# Patient Record
Sex: Female | Born: 1935
Health system: Southern US, Community
[De-identification: ages and names within clinical notes are randomized; demographics above are authoritative.]

## PROBLEM LIST (undated history)

## (undated) DIAGNOSIS — K819 Cholecystitis, unspecified: Secondary | ICD-10-CM

## (undated) DIAGNOSIS — A159 Respiratory tuberculosis unspecified: Secondary | ICD-10-CM

## (undated) DIAGNOSIS — E039 Hypothyroidism, unspecified: Secondary | ICD-10-CM

## (undated) DIAGNOSIS — D649 Anemia, unspecified: Secondary | ICD-10-CM

## (undated) DIAGNOSIS — C50919 Malignant neoplasm of unspecified site of unspecified female breast: Secondary | ICD-10-CM

## (undated) DIAGNOSIS — G9619 Other disorders of meninges, not elsewhere classified: Secondary | ICD-10-CM

## (undated) DIAGNOSIS — C73 Malignant neoplasm of thyroid gland: Secondary | ICD-10-CM

## (undated) DIAGNOSIS — G96198 Other disorders of meninges, not elsewhere classified: Secondary | ICD-10-CM

## (undated) DIAGNOSIS — M1990 Unspecified osteoarthritis, unspecified site: Secondary | ICD-10-CM

## (undated) DIAGNOSIS — K828 Other specified diseases of gallbladder: Secondary | ICD-10-CM

## (undated) DIAGNOSIS — K219 Gastro-esophageal reflux disease without esophagitis: Secondary | ICD-10-CM

## (undated) DIAGNOSIS — E119 Type 2 diabetes mellitus without complications: Secondary | ICD-10-CM

## (undated) DIAGNOSIS — C7931 Secondary malignant neoplasm of brain: Secondary | ICD-10-CM

## (undated) DIAGNOSIS — N390 Urinary tract infection, site not specified: Secondary | ICD-10-CM

## (undated) DIAGNOSIS — I10 Essential (primary) hypertension: Secondary | ICD-10-CM

## (undated) DIAGNOSIS — D493 Neoplasm of unspecified behavior of breast: Secondary | ICD-10-CM

## (undated) DIAGNOSIS — K37 Unspecified appendicitis: Secondary | ICD-10-CM

## (undated) HISTORY — DX: Malignant neoplasm of unspecified site of unspecified female breast: C50.919

## (undated) HISTORY — DX: Neoplasm of unspecified behavior of breast: D49.3

## (undated) HISTORY — PX: APPENDECTOMY: SHX54

## (undated) HISTORY — PX: SPINE SURGERY: SHX786

## (undated) HISTORY — DX: Gastro-esophageal reflux disease without esophagitis: K21.9

## (undated) HISTORY — PX: TOTAL THYROIDECTOMY: SHX2547

## (undated) HISTORY — PX: BREAST CYST EXCISION: SHX579

## (undated) HISTORY — DX: Other disorders of meninges, not elsewhere classified: G96.198

## (undated) HISTORY — DX: Unspecified appendicitis: K37

## (undated) HISTORY — DX: Respiratory tuberculosis unspecified: A15.9

## (undated) HISTORY — DX: Other disorders of meninges, not elsewhere classified: G96.19

## (undated) HISTORY — DX: Essential (primary) hypertension: I10

---

## 2000-05-26 ENCOUNTER — Encounter: Admission: RE | Admit: 2000-05-26 | Discharge: 2000-05-26 | Payer: Self-pay | Admitting: Obstetrics and Gynecology

## 2000-05-26 ENCOUNTER — Encounter: Payer: Self-pay | Admitting: Obstetrics and Gynecology

## 2001-05-29 ENCOUNTER — Encounter: Admission: RE | Admit: 2001-05-29 | Discharge: 2001-05-29 | Payer: Self-pay | Admitting: Obstetrics and Gynecology

## 2001-05-29 ENCOUNTER — Encounter: Payer: Self-pay | Admitting: Obstetrics and Gynecology

## 2001-07-06 ENCOUNTER — Other Ambulatory Visit: Admission: RE | Admit: 2001-07-06 | Discharge: 2001-07-06 | Payer: Self-pay | Admitting: Obstetrics and Gynecology

## 2002-06-04 ENCOUNTER — Encounter: Payer: Self-pay | Admitting: Obstetrics and Gynecology

## 2002-06-04 ENCOUNTER — Encounter: Admission: RE | Admit: 2002-06-04 | Discharge: 2002-06-04 | Payer: Self-pay | Admitting: Obstetrics and Gynecology

## 2003-11-10 ENCOUNTER — Ambulatory Visit (HOSPITAL_COMMUNITY): Admission: RE | Admit: 2003-11-10 | Discharge: 2003-11-10 | Payer: Self-pay | Admitting: Neurosurgery

## 2003-11-11 ENCOUNTER — Encounter (INDEPENDENT_AMBULATORY_CARE_PROVIDER_SITE_OTHER): Payer: Self-pay | Admitting: *Deleted

## 2003-11-11 ENCOUNTER — Inpatient Hospital Stay (HOSPITAL_COMMUNITY): Admission: RE | Admit: 2003-11-11 | Discharge: 2003-11-12 | Payer: Self-pay | Admitting: Neurosurgery

## 2004-05-11 ENCOUNTER — Encounter: Admission: RE | Admit: 2004-05-11 | Discharge: 2004-05-11 | Payer: Self-pay | Admitting: Obstetrics and Gynecology

## 2005-05-24 ENCOUNTER — Encounter: Admission: RE | Admit: 2005-05-24 | Discharge: 2005-05-24 | Payer: Self-pay | Admitting: Obstetrics and Gynecology

## 2005-07-13 ENCOUNTER — Emergency Department (HOSPITAL_COMMUNITY): Admission: EM | Admit: 2005-07-13 | Discharge: 2005-07-13 | Payer: Self-pay | Admitting: Emergency Medicine

## 2006-03-05 ENCOUNTER — Emergency Department (HOSPITAL_COMMUNITY): Admission: EM | Admit: 2006-03-05 | Discharge: 2006-03-06 | Payer: Self-pay | Admitting: Emergency Medicine

## 2006-05-27 ENCOUNTER — Encounter: Admission: RE | Admit: 2006-05-27 | Discharge: 2006-05-27 | Payer: Self-pay | Admitting: Internal Medicine

## 2006-06-05 ENCOUNTER — Encounter (INDEPENDENT_AMBULATORY_CARE_PROVIDER_SITE_OTHER): Payer: Self-pay | Admitting: Radiology

## 2006-06-05 ENCOUNTER — Encounter: Admission: RE | Admit: 2006-06-05 | Discharge: 2006-06-05 | Payer: Self-pay | Admitting: Internal Medicine

## 2006-06-05 ENCOUNTER — Encounter (INDEPENDENT_AMBULATORY_CARE_PROVIDER_SITE_OTHER): Payer: Self-pay | Admitting: *Deleted

## 2006-06-05 DIAGNOSIS — C50919 Malignant neoplasm of unspecified site of unspecified female breast: Secondary | ICD-10-CM

## 2006-06-05 HISTORY — DX: Malignant neoplasm of unspecified site of unspecified female breast: C50.919

## 2006-06-12 ENCOUNTER — Encounter: Admission: RE | Admit: 2006-06-12 | Discharge: 2006-06-12 | Payer: Self-pay | Admitting: General Surgery

## 2006-06-19 ENCOUNTER — Encounter: Admission: RE | Admit: 2006-06-19 | Discharge: 2006-06-19 | Payer: Self-pay | Admitting: General Surgery

## 2006-06-19 ENCOUNTER — Encounter (INDEPENDENT_AMBULATORY_CARE_PROVIDER_SITE_OTHER): Payer: Self-pay | Admitting: General Surgery

## 2006-06-19 ENCOUNTER — Encounter (INDEPENDENT_AMBULATORY_CARE_PROVIDER_SITE_OTHER): Payer: Self-pay | Admitting: *Deleted

## 2006-06-19 ENCOUNTER — Ambulatory Visit (HOSPITAL_BASED_OUTPATIENT_CLINIC_OR_DEPARTMENT_OTHER): Admission: RE | Admit: 2006-06-19 | Discharge: 2006-06-19 | Payer: Self-pay | Admitting: General Surgery

## 2006-06-19 HISTORY — PX: BREAST LUMPECTOMY WITH AXILLARY LYMPH NODE BIOPSY: SHX5593

## 2006-06-30 ENCOUNTER — Ambulatory Visit: Payer: Self-pay | Admitting: Oncology

## 2006-07-09 LAB — COMPREHENSIVE METABOLIC PANEL
ALT: 11 U/L (ref 0–35)
AST: 19 U/L (ref 0–37)
Albumin: 4.1 g/dL (ref 3.5–5.2)
CO2: 28 mEq/L (ref 19–32)
Calcium: 9.2 mg/dL (ref 8.4–10.5)
Chloride: 102 mEq/L (ref 96–112)
Creatinine, Ser: 0.74 mg/dL (ref 0.40–1.20)
Potassium: 4 mEq/L (ref 3.5–5.3)
Total Protein: 6.8 g/dL (ref 6.0–8.3)

## 2006-07-09 LAB — CBC WITH DIFFERENTIAL/PLATELET
BASO%: 0.4 % (ref 0.0–2.0)
Basophils Absolute: 0 10*3/uL (ref 0.0–0.1)
HCT: 37.2 % (ref 34.8–46.6)
HGB: 12.6 g/dL (ref 11.6–15.9)
MCHC: 34 g/dL (ref 32.0–36.0)
MONO#: 0.4 10*3/uL (ref 0.1–0.9)
NEUT%: 45.3 % (ref 39.6–76.8)
RDW: 13.5 % (ref 11.3–14.5)
WBC: 4.9 10*3/uL (ref 3.9–10.0)
lymph#: 2.1 10*3/uL (ref 0.9–3.3)

## 2006-07-09 LAB — CANCER ANTIGEN 27.29: CA 27.29: 19 U/mL (ref 0–39)

## 2006-07-11 ENCOUNTER — Ambulatory Visit: Admission: RE | Admit: 2006-07-11 | Discharge: 2006-09-22 | Payer: Self-pay | Admitting: *Deleted

## 2006-07-16 ENCOUNTER — Encounter: Admission: RE | Admit: 2006-07-16 | Discharge: 2006-07-16 | Payer: Self-pay | Admitting: *Deleted

## 2006-09-01 ENCOUNTER — Ambulatory Visit: Payer: Self-pay | Admitting: Oncology

## 2006-09-04 LAB — CBC WITH DIFFERENTIAL/PLATELET
BASO%: 0.6 % (ref 0.0–2.0)
EOS%: 2.6 % (ref 0.0–7.0)
LYMPH%: 28 % (ref 14.0–48.0)
MCH: 32.3 pg (ref 26.0–34.0)
MCHC: 34.9 g/dL (ref 32.0–36.0)
MCV: 92.7 fL (ref 81.0–101.0)
MONO%: 10.3 % (ref 0.0–13.0)
NEUT#: 2.1 10*3/uL (ref 1.5–6.5)
Platelets: 251 10*3/uL (ref 145–400)
RBC: 4.08 10*6/uL (ref 3.70–5.32)
RDW: 13.4 % (ref 11.3–14.5)

## 2006-09-04 LAB — COMPREHENSIVE METABOLIC PANEL
AST: 18 U/L (ref 0–37)
Albumin: 4.1 g/dL (ref 3.5–5.2)
Alkaline Phosphatase: 58 U/L (ref 39–117)
Glucose, Bld: 122 mg/dL — ABNORMAL HIGH (ref 70–99)
Potassium: 3.5 mEq/L (ref 3.5–5.3)
Sodium: 141 mEq/L (ref 135–145)
Total Bilirubin: 0.5 mg/dL (ref 0.3–1.2)
Total Protein: 6.9 g/dL (ref 6.0–8.3)

## 2006-10-28 ENCOUNTER — Ambulatory Visit: Payer: Self-pay | Admitting: Oncology

## 2006-10-30 LAB — COMPREHENSIVE METABOLIC PANEL
AST: 17 U/L (ref 0–37)
BUN: 12 mg/dL (ref 6–23)
CO2: 27 mEq/L (ref 19–32)
Calcium: 9.4 mg/dL (ref 8.4–10.5)
Chloride: 105 mEq/L (ref 96–112)
Creatinine, Ser: 0.76 mg/dL (ref 0.40–1.20)
Glucose, Bld: 121 mg/dL — ABNORMAL HIGH (ref 70–99)

## 2006-10-30 LAB — CBC WITH DIFFERENTIAL/PLATELET
Basophils Absolute: 0 10*3/uL (ref 0.0–0.1)
EOS%: 2.2 % (ref 0.0–7.0)
HCT: 37.6 % (ref 34.8–46.6)
HGB: 13.4 g/dL (ref 11.6–15.9)
MCH: 32.3 pg (ref 26.0–34.0)
NEUT%: 59.4 % (ref 39.6–76.8)
lymph#: 1.1 10*3/uL (ref 0.9–3.3)

## 2006-10-30 LAB — LACTATE DEHYDROGENASE: LDH: 127 U/L (ref 94–250)

## 2007-02-02 ENCOUNTER — Ambulatory Visit: Payer: Self-pay | Admitting: Oncology

## 2007-02-03 LAB — COMPREHENSIVE METABOLIC PANEL
AST: 19 U/L (ref 0–37)
Albumin: 4.2 g/dL (ref 3.5–5.2)
BUN: 18 mg/dL (ref 6–23)
Calcium: 8.8 mg/dL (ref 8.4–10.5)
Chloride: 105 mEq/L (ref 96–112)
Glucose, Bld: 124 mg/dL — ABNORMAL HIGH (ref 70–99)
Potassium: 3.7 mEq/L (ref 3.5–5.3)
Sodium: 142 mEq/L (ref 135–145)
Total Protein: 6.8 g/dL (ref 6.0–8.3)

## 2007-02-03 LAB — CBC WITH DIFFERENTIAL/PLATELET
Basophils Absolute: 0 10*3/uL (ref 0.0–0.1)
Eosinophils Absolute: 0.1 10*3/uL (ref 0.0–0.5)
HGB: 12.6 g/dL (ref 11.6–15.9)
NEUT#: 2.6 10*3/uL (ref 1.5–6.5)
RDW: 13.4 % (ref 11.3–14.5)
WBC: 4.3 10*3/uL (ref 3.9–10.0)
lymph#: 1.2 10*3/uL (ref 0.9–3.3)

## 2007-02-09 ENCOUNTER — Encounter: Admission: RE | Admit: 2007-02-09 | Discharge: 2007-02-09 | Payer: Self-pay | Admitting: Oncology

## 2007-02-16 LAB — CBC WITH DIFFERENTIAL/PLATELET
Basophils Absolute: 0 10*3/uL (ref 0.0–0.1)
Eosinophils Absolute: 0.1 10*3/uL (ref 0.0–0.5)
HCT: 34.9 % (ref 34.8–46.6)
HGB: 12.3 g/dL (ref 11.6–15.9)
LYMPH%: 15.8 % (ref 14.0–48.0)
MONO#: 0.1 10*3/uL (ref 0.1–0.9)
NEUT#: 4.9 10*3/uL (ref 1.5–6.5)
Platelets: 315 10*3/uL (ref 145–400)
RBC: 3.78 10*6/uL (ref 3.70–5.32)
WBC: 5.9 10*3/uL (ref 3.9–10.0)

## 2007-03-09 ENCOUNTER — Encounter (INDEPENDENT_AMBULATORY_CARE_PROVIDER_SITE_OTHER): Payer: Self-pay | Admitting: Radiology

## 2007-03-09 ENCOUNTER — Encounter: Admission: RE | Admit: 2007-03-09 | Discharge: 2007-03-09 | Payer: Self-pay | Admitting: Oncology

## 2007-03-09 ENCOUNTER — Other Ambulatory Visit: Admission: RE | Admit: 2007-03-09 | Discharge: 2007-03-09 | Payer: Self-pay | Admitting: Radiology

## 2007-04-09 ENCOUNTER — Emergency Department (HOSPITAL_COMMUNITY): Admission: EM | Admit: 2007-04-09 | Discharge: 2007-04-09 | Payer: Self-pay | Admitting: Emergency Medicine

## 2007-04-24 ENCOUNTER — Encounter: Admission: RE | Admit: 2007-04-24 | Discharge: 2007-04-24 | Payer: Self-pay | Admitting: General Surgery

## 2007-05-12 ENCOUNTER — Ambulatory Visit: Payer: Self-pay | Admitting: Oncology

## 2007-05-14 LAB — COMPREHENSIVE METABOLIC PANEL
CO2: 27 mEq/L (ref 19–32)
Creatinine, Ser: 0.73 mg/dL (ref 0.40–1.20)
Glucose, Bld: 81 mg/dL (ref 70–99)
Total Bilirubin: 0.5 mg/dL (ref 0.3–1.2)

## 2007-05-14 LAB — CBC WITH DIFFERENTIAL/PLATELET
BASO%: 0.1 % (ref 0.0–2.0)
Eosinophils Absolute: 0.1 10*3/uL (ref 0.0–0.5)
HCT: 35.5 % (ref 34.8–46.6)
LYMPH%: 34 % (ref 14.0–48.0)
MCHC: 35.3 g/dL (ref 32.0–36.0)
MONO#: 0.4 10*3/uL (ref 0.1–0.9)
NEUT%: 54.1 % (ref 39.6–76.8)
Platelets: 255 10*3/uL (ref 145–400)
WBC: 4 10*3/uL (ref 3.9–10.0)

## 2007-05-14 LAB — LACTATE DEHYDROGENASE: LDH: 137 U/L (ref 94–250)

## 2007-08-06 HISTORY — PX: CATARACT EXTRACTION, BILATERAL: SHX1313

## 2007-09-06 ENCOUNTER — Emergency Department (HOSPITAL_COMMUNITY): Admission: EM | Admit: 2007-09-06 | Discharge: 2007-09-06 | Payer: Self-pay | Admitting: Emergency Medicine

## 2007-09-16 ENCOUNTER — Ambulatory Visit: Payer: Self-pay | Admitting: Oncology

## 2007-09-17 LAB — CBC WITH DIFFERENTIAL/PLATELET
Basophils Absolute: 0 10*3/uL (ref 0.0–0.1)
EOS%: 2 % (ref 0.0–7.0)
HCT: 38.5 % (ref 34.8–46.6)
HGB: 12.7 g/dL (ref 11.6–15.9)
MCH: 30.1 pg (ref 26.0–34.0)
MCV: 90.9 fL (ref 81.0–101.0)
MONO%: 9.3 % (ref 0.0–13.0)
NEUT%: 55.9 % (ref 39.6–76.8)

## 2007-09-18 LAB — COMPREHENSIVE METABOLIC PANEL
ALT: 8 U/L (ref 0–35)
AST: 16 U/L (ref 0–37)
Creatinine, Ser: 0.74 mg/dL (ref 0.40–1.20)
Total Bilirubin: 0.4 mg/dL (ref 0.3–1.2)

## 2007-09-18 LAB — CANCER ANTIGEN 27.29: CA 27.29: 22 U/mL (ref 0–39)

## 2007-09-23 LAB — VITAMIN D 1,25 DIHYDROXY: Vit D, 1,25-Dihydroxy: 28 pg/mL (ref 6–62)

## 2007-10-22 ENCOUNTER — Encounter: Admission: RE | Admit: 2007-10-22 | Discharge: 2007-10-22 | Payer: Self-pay | Admitting: Oncology

## 2007-10-22 ENCOUNTER — Ambulatory Visit (HOSPITAL_COMMUNITY): Admission: RE | Admit: 2007-10-22 | Discharge: 2007-10-22 | Payer: Self-pay | Admitting: Oncology

## 2008-04-01 ENCOUNTER — Ambulatory Visit: Payer: Self-pay | Admitting: Oncology

## 2008-04-05 LAB — CBC WITH DIFFERENTIAL/PLATELET
Eosinophils Absolute: 0.1 10*3/uL (ref 0.0–0.5)
HGB: 12.6 g/dL (ref 11.6–15.9)
MCV: 92.5 fL (ref 81.0–101.0)
MONO#: 0.5 10*3/uL (ref 0.1–0.9)
MONO%: 10.5 % (ref 0.0–13.0)
NEUT#: 2.3 10*3/uL (ref 1.5–6.5)
RBC: 3.89 10*6/uL (ref 3.70–5.32)
RDW: 13.5 % (ref 11.3–14.5)
WBC: 4.6 10*3/uL (ref 3.9–10.0)
lymph#: 1.7 10*3/uL (ref 0.9–3.3)

## 2008-04-06 LAB — COMPREHENSIVE METABOLIC PANEL
AST: 16 U/L (ref 0–37)
Albumin: 4.1 g/dL (ref 3.5–5.2)
Alkaline Phosphatase: 61 U/L (ref 39–117)
Calcium: 9.1 mg/dL (ref 8.4–10.5)
Chloride: 102 mEq/L (ref 96–112)
Glucose, Bld: 94 mg/dL (ref 70–99)
Potassium: 4 mEq/L (ref 3.5–5.3)
Sodium: 138 mEq/L (ref 135–145)
Total Protein: 6.8 g/dL (ref 6.0–8.3)

## 2008-04-06 LAB — LACTATE DEHYDROGENASE: LDH: 135 U/L (ref 94–250)

## 2008-04-25 ENCOUNTER — Encounter: Admission: RE | Admit: 2008-04-25 | Discharge: 2008-04-25 | Payer: Self-pay | Admitting: Oncology

## 2008-09-30 ENCOUNTER — Ambulatory Visit: Payer: Self-pay | Admitting: Oncology

## 2008-10-04 LAB — CBC WITH DIFFERENTIAL/PLATELET
BASO%: 0.6 % (ref 0.0–2.0)
Eosinophils Absolute: 0.1 10*3/uL (ref 0.0–0.5)
HCT: 37.7 % (ref 34.8–46.6)
LYMPH%: 32.4 % (ref 14.0–49.7)
MCHC: 34.6 g/dL (ref 31.5–36.0)
MONO#: 0.4 10*3/uL (ref 0.1–0.9)
NEUT#: 3.2 10*3/uL (ref 1.5–6.5)
NEUT%: 58.2 % (ref 38.4–76.8)
Platelets: 246 10*3/uL (ref 145–400)
WBC: 5.6 10*3/uL (ref 3.9–10.3)
lymph#: 1.8 10*3/uL (ref 0.9–3.3)

## 2008-10-05 LAB — COMPREHENSIVE METABOLIC PANEL
ALT: 12 U/L (ref 0–35)
CO2: 25 mEq/L (ref 19–32)
Calcium: 8.9 mg/dL (ref 8.4–10.5)
Chloride: 102 mEq/L (ref 96–112)
Glucose, Bld: 137 mg/dL — ABNORMAL HIGH (ref 70–99)
Sodium: 141 mEq/L (ref 135–145)
Total Bilirubin: 0.4 mg/dL (ref 0.3–1.2)
Total Protein: 7.2 g/dL (ref 6.0–8.3)

## 2008-10-05 LAB — CANCER ANTIGEN 27.29: CA 27.29: 28 U/mL (ref 0–39)

## 2008-10-05 LAB — LACTATE DEHYDROGENASE: LDH: 151 U/L (ref 94–250)

## 2008-10-13 ENCOUNTER — Encounter: Admission: RE | Admit: 2008-10-13 | Discharge: 2008-10-13 | Payer: Self-pay | Admitting: Oncology

## 2009-04-26 ENCOUNTER — Encounter: Admission: RE | Admit: 2009-04-26 | Discharge: 2009-04-26 | Payer: Self-pay | Admitting: Oncology

## 2009-05-04 ENCOUNTER — Ambulatory Visit: Payer: Self-pay | Admitting: Oncology

## 2009-05-08 LAB — CBC WITH DIFFERENTIAL/PLATELET
EOS%: 1.5 % (ref 0.0–7.0)
Eosinophils Absolute: 0.1 10*3/uL (ref 0.0–0.5)
LYMPH%: 31.3 % (ref 14.0–49.7)
MCH: 32.9 pg (ref 25.1–34.0)
MCV: 93.8 fL (ref 79.5–101.0)
MONO%: 8.1 % (ref 0.0–14.0)
NEUT#: 2.5 10*3/uL (ref 1.5–6.5)
Platelets: 232 10*3/uL (ref 145–400)
RBC: 4.06 10*6/uL (ref 3.70–5.45)

## 2009-05-09 LAB — CANCER ANTIGEN 27.29: CA 27.29: 24 U/mL (ref 0–39)

## 2009-05-09 LAB — COMPREHENSIVE METABOLIC PANEL
Alkaline Phosphatase: 55 U/L (ref 39–117)
BUN: 15 mg/dL (ref 6–23)
Glucose, Bld: 149 mg/dL — ABNORMAL HIGH (ref 70–99)
Sodium: 139 mEq/L (ref 135–145)
Total Bilirubin: 0.4 mg/dL (ref 0.3–1.2)
Total Protein: 6.7 g/dL (ref 6.0–8.3)

## 2009-05-09 LAB — VITAMIN D 25 HYDROXY (VIT D DEFICIENCY, FRACTURES): Vit D, 25-Hydroxy: 42 ng/mL (ref 30–89)

## 2009-11-06 ENCOUNTER — Ambulatory Visit: Payer: Self-pay | Admitting: Oncology

## 2009-11-08 LAB — LACTATE DEHYDROGENASE: LDH: 152 U/L (ref 94–250)

## 2009-11-08 LAB — CBC WITH DIFFERENTIAL/PLATELET
BASO%: 0.5 % (ref 0.0–2.0)
Basophils Absolute: 0 10*3/uL (ref 0.0–0.1)
EOS%: 1.2 % (ref 0.0–7.0)
HCT: 39.4 % (ref 34.8–46.6)
HGB: 13.6 g/dL (ref 11.6–15.9)
LYMPH%: 27.6 % (ref 14.0–49.7)
MCH: 32.5 pg (ref 25.1–34.0)
MCHC: 34.4 g/dL (ref 31.5–36.0)
NEUT%: 63.2 % (ref 38.4–76.8)
Platelets: 243 10*3/uL (ref 145–400)
lymph#: 1.6 10*3/uL (ref 0.9–3.3)

## 2009-11-08 LAB — COMPREHENSIVE METABOLIC PANEL
ALT: 10 U/L (ref 0–35)
BUN: 16 mg/dL (ref 6–23)
CO2: 24 mEq/L (ref 19–32)
Calcium: 8.9 mg/dL (ref 8.4–10.5)
Chloride: 100 mEq/L (ref 96–112)
Creatinine, Ser: 0.82 mg/dL (ref 0.40–1.20)
Total Bilirubin: 0.4 mg/dL (ref 0.3–1.2)

## 2009-11-08 LAB — VITAMIN D 25 HYDROXY (VIT D DEFICIENCY, FRACTURES): Vit D, 25-Hydroxy: 38 ng/mL (ref 30–89)

## 2010-05-02 ENCOUNTER — Encounter: Admission: RE | Admit: 2010-05-02 | Discharge: 2010-05-02 | Payer: Self-pay | Admitting: Oncology

## 2010-05-25 ENCOUNTER — Ambulatory Visit: Payer: Self-pay | Admitting: Oncology

## 2010-05-29 LAB — CBC WITH DIFFERENTIAL/PLATELET
BASO%: 0.5 % (ref 0.0–2.0)
Basophils Absolute: 0 10*3/uL (ref 0.0–0.1)
EOS%: 1.5 % (ref 0.0–7.0)
HGB: 12.7 g/dL (ref 11.6–15.9)
MCH: 32.9 pg (ref 25.1–34.0)
MCHC: 34.9 g/dL (ref 31.5–36.0)
MCV: 94.1 fL (ref 79.5–101.0)
MONO%: 10.2 % (ref 0.0–14.0)
RBC: 3.85 10*6/uL (ref 3.70–5.45)
RDW: 13.2 % (ref 11.2–14.5)
lymph#: 1.4 10*3/uL (ref 0.9–3.3)

## 2010-05-30 LAB — COMPREHENSIVE METABOLIC PANEL
ALT: 12 U/L (ref 0–35)
AST: 20 U/L (ref 0–37)
Albumin: 4.2 g/dL (ref 3.5–5.2)
Alkaline Phosphatase: 51 U/L (ref 39–117)
Calcium: 9.8 mg/dL (ref 8.4–10.5)
Chloride: 99 mEq/L (ref 96–112)
Potassium: 3.7 mEq/L (ref 3.5–5.3)
Sodium: 139 mEq/L (ref 135–145)
Total Protein: 7.2 g/dL (ref 6.0–8.3)

## 2010-05-30 LAB — VITAMIN D 25 HYDROXY (VIT D DEFICIENCY, FRACTURES): Vit D, 25-Hydroxy: 64 ng/mL (ref 30–89)

## 2010-08-26 ENCOUNTER — Encounter: Payer: Self-pay | Admitting: General Surgery

## 2010-09-05 ENCOUNTER — Encounter: Payer: Self-pay | Admitting: Oncology

## 2010-12-04 ENCOUNTER — Other Ambulatory Visit: Payer: Self-pay | Admitting: Oncology

## 2010-12-04 ENCOUNTER — Encounter (HOSPITAL_BASED_OUTPATIENT_CLINIC_OR_DEPARTMENT_OTHER): Payer: Medicare Other | Admitting: Oncology

## 2010-12-04 DIAGNOSIS — C50219 Malignant neoplasm of upper-inner quadrant of unspecified female breast: Secondary | ICD-10-CM

## 2010-12-04 DIAGNOSIS — C50419 Malignant neoplasm of upper-outer quadrant of unspecified female breast: Secondary | ICD-10-CM

## 2010-12-04 DIAGNOSIS — C50919 Malignant neoplasm of unspecified site of unspecified female breast: Secondary | ICD-10-CM

## 2010-12-04 DIAGNOSIS — Z170 Estrogen receptor positive status [ER+]: Secondary | ICD-10-CM

## 2010-12-04 LAB — CBC WITH DIFFERENTIAL/PLATELET
BASO%: 0.4 % (ref 0.0–2.0)
Eosinophils Absolute: 0.1 10*3/uL (ref 0.0–0.5)
MCHC: 35 g/dL (ref 31.5–36.0)
MONO#: 0.5 10*3/uL (ref 0.1–0.9)
NEUT#: 3.1 10*3/uL (ref 1.5–6.5)
RBC: 3.85 10*6/uL (ref 3.70–5.45)
RDW: 13 % (ref 11.2–14.5)
WBC: 5.2 10*3/uL (ref 3.9–10.3)
lymph#: 1.5 10*3/uL (ref 0.9–3.3)

## 2010-12-05 LAB — COMPREHENSIVE METABOLIC PANEL
ALT: 11 U/L (ref 0–35)
Albumin: 4 g/dL (ref 3.5–5.2)
CO2: 23 mEq/L (ref 19–32)
Calcium: 8.7 mg/dL (ref 8.4–10.5)
Chloride: 102 mEq/L (ref 96–112)
Glucose, Bld: 173 mg/dL — ABNORMAL HIGH (ref 70–99)
Potassium: 3.2 mEq/L — ABNORMAL LOW (ref 3.5–5.3)
Sodium: 140 mEq/L (ref 135–145)
Total Bilirubin: 0.3 mg/dL (ref 0.3–1.2)
Total Protein: 6.3 g/dL (ref 6.0–8.3)

## 2010-12-05 LAB — LACTATE DEHYDROGENASE: LDH: 141 U/L (ref 94–250)

## 2010-12-11 ENCOUNTER — Encounter (HOSPITAL_BASED_OUTPATIENT_CLINIC_OR_DEPARTMENT_OTHER): Payer: Medicare Other | Admitting: Oncology

## 2010-12-11 ENCOUNTER — Other Ambulatory Visit: Payer: Self-pay | Admitting: Oncology

## 2010-12-11 DIAGNOSIS — N6310 Unspecified lump in the right breast, unspecified quadrant: Secondary | ICD-10-CM

## 2010-12-11 DIAGNOSIS — C50219 Malignant neoplasm of upper-inner quadrant of unspecified female breast: Secondary | ICD-10-CM

## 2010-12-21 NOTE — Op Note (Signed)
Monique Figueroa, Monique Figueroa             ACCOUNT NO.:  [id number]   MEDICAL RECORD NO.:  [id number]          PATIENT TYPE:  AMB   LOCATION:  DSC                          FACILITY:  [facility]   PHYSICIAN:  Timothy E. Earlene Plater, M.D. DATE OF BIRTH:  [birth year]   DATE OF PROCEDURE:  06/19/2006  DATE OF DISCHARGE:                               OPERATIVE REPORT   PREOPERATIVE DIAGNOSIS:  Ductal carcinoma in situ left breast.   POSTOPERATIVE DIAGNOSIS:  Ductal carcinoma in situ left breast.   PROCEDURE:  Blue dye injection, sentinel node biopsy, partial  mastectomy, left.   SURGEON:  Timothy E. Earlene Plater, M.D.   ANESTHESIA:  General.   Monique Figueroa is a 77 year old female who upon routine mammography was  found to have scattered questionable calcifications in upper outer  quadrant left breast.  Core biopsies done showed DCIS.  She has been  carefully counseled and is ready to proceed with the surgery.  Risk  factors include of course her age and a wide area of calcification to be  removed.  She knows that there is the chance of residual disease, of  reoperation, and certainly of diminution of the size of her breast.  She  was seen earlier today at the [facility] of and [place], where an  excellent bracketing needle localization was accomplished by Dr. Kearney Hard.  She was also injected with technetium in the left nipple prior to  surgery.  She was seen, identified, and the left breast marked.   The patient was taken to the operating room and placed supine. General  LMA anesthesia was provided.  The left chest and breast were exposed.  The location of the entrance of the bracketing wires was carefully  coordinated with the mammograms accompanying them, and the skin was  marked for a generous incision for partial mastectomy.  Blue dye was  injected then beneath the subcutaneous of the left nipple and massaged  well.  Then, the breast was prepped and draped in the usual fashion.  One area that was hot  was located in the left axilla.  Prior to each  incision, 0.25% Marcaine with epinephrine was used.  A small incision  was made in the midaxilla, and then following visual dissection and  Neoprobe use, a single focus hot tissue was found and removed with the  cautery.  No excessive bleeding.  It was only one area.  It was not  blue.  This was sent as #1, axilla, left, to the lab.  That wound was  covered, and then again after careful visualizing of the bracketing  needles and the mammograms, a generous incision was made in a slanting  horizontal fashion across the top of the breast.  It was 12 cm in  length.  I then dissected a superior flap to encompass the superior  needle and an inferior flap to encompass the inferior needle which was  below the nipple-areolar complex.  I then in essence remove the top half  of her breast tissue by surrounding the entire envelope of the breast  medially, superiorly, and laterally around the pectoralis muscle and  down to the pectoralis fascia, and then inferiorly down to below the  nipple-areolar complex.  This area was carefully marked with sutures of  different colors and submitted to x-ray.  Meanwhile, bleeding was  controlled. That wound was perfectly dry.   The pathologist returned a call that the sentinel node was negative.  That wound was closed with Monocryl.  Dr. Kearney Hard returned a call that the  area of questionable calcifications was included in the excised  specimen.  The wound was checked again and then closed in layers and the  skin with wide skin staples.  All counts were correct.  She had  tolerated this well.  I had talked the pathologist, and we elected not  to the touch preps of the specimen since it was DCIS as far as we know,  and we will await the final pathology report.  So, the bandages were  applied, tape was carefully applied, and the patient was removed to the  recovery room in good condition.   Careful discussion with the  family and revelation of all known  information at present, and close followup to be accomplished.  Vicodin  and written instructions were given.      Timothy E. Earlene Plater, M.D.  Electronically Signed     TED/MEDQ  D:  06/19/2006  T:  06/19/2006  Job:  [id number]   cc:   [facility]

## 2010-12-25 NOTE — Op Note (Signed)
NAMEMarland Figueroa  Monique Figueroa, CRUTCHLEY                       ACCOUNT NO.:  [id number]   MEDICAL RECORD NO.:  [id number]                   PATIENT TYPE:  INP   LOCATION:  3010                                 FACILITY:  [facility]   PHYSICIAN:  Danae Orleans. Venetia Maxon, M.D.               DATE OF BIRTH:  [birth year]   DATE OF PROCEDURE:  11/11/2003  DATE OF DISCHARGE:                                 OPERATIVE REPORT   PREOPERATIVE DIAGNOSIS:  Synovial cyst L5-S1 right with spondylosis,  degenerative disc disease, and radiculopathy.   POSTOPERATIVE DIAGNOSIS:  Synovial cyst L5-S1 right with spondylosis,  degenerative disc disease, and radiculopathy.   PROCEDURE:  Right L5-S1 hemi-semilaminectomy with resection of synovial cyst  with microdissection.   SURGEON:  Danae Orleans. Venetia Maxon, M.D.   ASSISTANT:  Monique Figueroa, M.D.   ANESTHESIA:  General endotracheal anesthesia.   ESTIMATED BLOOD LOSS:  Minimal.   COMPLICATIONS:  None.   DISPOSITION:  Recovery room.   INDICATIONS FOR PROCEDURE:  Monique Figueroa is a 77 year old woman with  right lower extremity pain in the S1 distribution.  She had preoperative  imaging which demonstrates a synovial cyst compressing the right S1 nerve  root.  It was elected to take her to surgery for decompression of this cyst.   PROCEDURE:  [name] was brought to the operating room.  Following  satisfactory uncomplicated induction of general endotracheal anesthesia and  insertion of intravenous  lines, the patient was placed in a prone position  on the operating table on a Wilson frame.  Her low back was then prepped and  draped in the usual sterile fashion.  The area of planned incision was  infiltrated with 0.25% Marcaine and 0.5% lidocaine with 1:200,000  epinephrine.  An incision was made in the midline and carried through  copious adipost tissue until the lumbodorsal fascia was incised on the right  side of the midline.  Subperiosteal dissection was then performed  exposing  the L5-S1 interspace.  A marker probe was placed at what were felt to be the  L4-L5 and L5-S1 level and interoperative x-ray confirmed the correct  orientation.  Subsequently, hemi-semilaminectomy of L5 was performed using a  high speed drill and Kerrison rongeur.  The ligamentum flavum was detached  and removed in a piecemeal fashion, although this was adherent to the  synovial cyst and was not completely removed.  A foraminotomy was performed  overlying the S1 nerve root.  The microscope was brought onto the field and  using microdissection technique, the laminectomy was extended cephalad to  dura beyond the cyst and this was then very carefully dissected free from  the thecal sac using painstaking microdissection technique.  The cyst was  very densely adherent to the dura and was quite difficult to remove.  Using  traction along the cyst, it was eventually freed from the dura and from the  nerve root and was then removed in  a piecemeal fashion.  Eventually, the S1  nerve root was decompressed as was the lateral aspect of the thecal sac and  a small amount of residual cyst material was left adherent to the axilla of  the take off of the S1 nerve root as I felt there was too high a risk of  dural injury to remove this and there was no longer any compression on the  dura.  The wound was then copiously irrigated with Bacitracin saline.  The  dura was inspected and there was no evidence of any CSF leak.  The self-  retaining retractor was removed, the microscope was taken off the field.  A  coronary dilator was easily inserted out the neural foramen prior to  removing the microscope demonstrating complete decompression of the nerve  root.  The lumbodorsal fascia was closed with 0 Vicryl sutures, the  subcutaneous tissues were reapproximated with 2-0 Vicryl interrupted  inverted sutures, and the skin edges were reapproximated with interrupted 3-  0 Vicryl subcuticular tissue.  The  wound was dressed with Dermabond.  The  patient was extubated in the operating room and taken to the recovery room  in stable satisfactory condition having tolerated the operation well.  Counts were correct at the end of the case.                                               Danae Orleans. Venetia Maxon, M.D.    JDS/MEDQ  D:  11/11/2003  T:  11/11/2003  Job:  [id number]

## 2011-05-06 ENCOUNTER — Other Ambulatory Visit: Payer: Medicare Other

## 2011-05-08 ENCOUNTER — Ambulatory Visit
Admission: RE | Admit: 2011-05-08 | Discharge: 2011-05-08 | Disposition: A | Discharge status: Home / Self Care | Payer: Medicare Other | Source: Ambulatory Visit | Attending: Oncology | Admitting: Oncology

## 2011-05-08 DIAGNOSIS — N6310 Unspecified lump in the right breast, unspecified quadrant: Secondary | ICD-10-CM

## 2011-06-06 ENCOUNTER — Other Ambulatory Visit: Payer: Self-pay | Admitting: Oncology

## 2011-06-06 ENCOUNTER — Encounter (HOSPITAL_BASED_OUTPATIENT_CLINIC_OR_DEPARTMENT_OTHER): Payer: Medicare Other | Admitting: Oncology

## 2011-06-06 DIAGNOSIS — C50219 Malignant neoplasm of upper-inner quadrant of unspecified female breast: Secondary | ICD-10-CM

## 2011-06-06 LAB — CBC WITH DIFFERENTIAL/PLATELET
BASO%: 0.4 % (ref 0.0–2.0)
Basophils Absolute: 0 10*3/uL (ref 0.0–0.1)
HCT: 37.9 % (ref 34.8–46.6)
HGB: 12.7 g/dL (ref 11.6–15.9)
LYMPH%: 24 % (ref 14.0–49.7)
MCH: 31.5 pg (ref 25.1–34.0)
MCHC: 33.6 g/dL (ref 31.5–36.0)
MONO#: 0.4 10*3/uL (ref 0.1–0.9)
NEUT%: 66 % (ref 38.4–76.8)
Platelets: 252 10*3/uL (ref 145–400)
WBC: 5.6 10*3/uL (ref 3.9–10.3)
lymph#: 1.3 10*3/uL (ref 0.9–3.3)

## 2011-06-07 LAB — COMPREHENSIVE METABOLIC PANEL
ALT: 13 U/L (ref 0–35)
AST: 21 U/L (ref 0–37)
Albumin: 4.2 g/dL (ref 3.5–5.2)
BUN: 15 mg/dL (ref 6–23)
CO2: 25 mEq/L (ref 19–32)
Calcium: 9.2 mg/dL (ref 8.4–10.5)
Chloride: 102 mEq/L (ref 96–112)
Creatinine, Ser: 0.82 mg/dL (ref 0.50–1.10)
Potassium: 3.2 mEq/L — ABNORMAL LOW (ref 3.5–5.3)

## 2011-06-10 ENCOUNTER — Ambulatory Visit (HOSPITAL_BASED_OUTPATIENT_CLINIC_OR_DEPARTMENT_OTHER): Payer: Medicare Other | Admitting: Oncology

## 2011-06-10 VITALS — BP 163/85 | HR 85 | Temp 97.6°F | Ht 64.3 in | Wt 184.7 lb

## 2011-06-10 DIAGNOSIS — C50919 Malignant neoplasm of unspecified site of unspecified female breast: Secondary | ICD-10-CM

## 2011-06-10 DIAGNOSIS — C50219 Malignant neoplasm of upper-inner quadrant of unspecified female breast: Secondary | ICD-10-CM

## 2011-06-10 DIAGNOSIS — E119 Type 2 diabetes mellitus without complications: Secondary | ICD-10-CM

## 2011-06-10 NOTE — Progress Notes (Signed)
Hematology and Oncology Follow Up Visit  Monique Figueroa [id number] [birth year] 77 y.o. 06/10/2011 6:19 PM   Principle Diagnosis: T1 invasive breast cancer associated DCIS, status post lumpectomy 06/19/2006, status post radiation therapy completed 09/20/2007 on Arimidex.  Interim History:  Ms. Monique Figueroa returns for followup. She is doing well. Her husband has recovered from his colon cancer surgery and chemotherapy. Ms. Monique Figueroa has been busy with a [employer] fundraiser which were raised about $7000 for her church.  Medications: I have reviewed the patient's current medications.  Allergies: Allergies not on file  Past Medical History, Surgical history, Social history, and Family History were reviewed and updated.  Review of Systems: Constitutional:  Negative for fever, chills, night sweats, anorexia, weight loss, pain. Cardiovascular: no chest pain or dyspnea on exertion Respiratory: no cough, shortness of breath, or wheezing Neurological: negative Dermatological: negative ENT: negative Skin Gastrointestinal: no abdominal pain, change in bowel habits, or black or bloody stools Genito-Urinary: no dysuria, trouble voiding, or hematuria Hematological and Lymphatic: negative Breast: negative for breast lumps Musculoskeletal: negative Remaining ROS negative.  Physical Exam: Blood pressure 163/85, pulse 85, temperature 97.6 F (36.4 C), temperature source Oral, height 5' 4.3" (1.633 m), weight 83.779 kg (184 lb 11.2 oz). ECOG:  General appearance: alert, cooperative and appears stated age Head: Normocephalic, without obvious abnormality, atraumatic Neck: no adenopathy, no carotid bruit, no JVD, supple, symmetrical, trachea midline and thyroid not enlarged, symmetric, no tenderness/mass/nodules Lymph nodes: Cervical, supraclavicular, and axillary nodes normal. Breast: Both breasts are free of any obvious masses. The left breast status post lumpectomy. There is a scar which has  healed well. No nipple retraction or skin changes. Cardiac exam: Normal rate and rhythm, no murmurs Lung exam: Normal air entry no rales or rhonchi Abdominal exam: No masses, no organomegaly  Extremities: Normal   Lab Results: Lab Results  Component Value Date   HGB 12.7 06/06/2011   HCT 37.9 06/06/2011   MCV 93.9 06/06/2011   PLT 252 06/06/2011     Chemistry      Component Value Date/Time   NA 140 06/06/2011 1338   NA 140 06/06/2011 1338   NA 140 06/06/2011 1338   NA 140 06/06/2011 1338   K 3.2* 06/06/2011 1338   K 3.2* 06/06/2011 1338   K 3.2* 06/06/2011 1338   K 3.2* 06/06/2011 1338   CL 102 06/06/2011 1338   CL 102 06/06/2011 1338   CL 102 06/06/2011 1338   CL 102 06/06/2011 1338   CO2 25 06/06/2011 1338   CO2 25 06/06/2011 1338   CO2 25 06/06/2011 1338   CO2 25 06/06/2011 1338   BUN 15 06/06/2011 1338   BUN 15 06/06/2011 1338   BUN 15 06/06/2011 1338   BUN 15 06/06/2011 1338   CREATININE 0.82 06/06/2011 1338   CREATININE 0.82 06/06/2011 1338   CREATININE 0.82 06/06/2011 1338   CREATININE 0.82 06/06/2011 1338      Component Value Date/Time   CALCIUM 9.2 06/06/2011 1338   CALCIUM 9.2 06/06/2011 1338   CALCIUM 9.2 06/06/2011 1338   CALCIUM 9.2 06/06/2011 1338   ALKPHOS 49 06/06/2011 1338   ALKPHOS 49 06/06/2011 1338   ALKPHOS 49 06/06/2011 1338   ALKPHOS 49 06/06/2011 1338   AST 21 06/06/2011 1338   AST 21 06/06/2011 1338   AST 21 06/06/2011 1338   AST 21 06/06/2011 1338   ALT 13 06/06/2011 1338   ALT 13 06/06/2011 1338   ALT 13 06/06/2011 1338   ALT 13  06/06/2011 1338   BILITOT 0.4 06/06/2011 1338   BILITOT 0.4 06/06/2011 1338   BILITOT 0.4 06/06/2011 1338   BILITOT 0.4 06/06/2011 1338       Radiological Studies: chest X-ray  None  Impression and Plan: Ms. Monique Figueroa is doing well. She has completed 5 years of Arimidex. Of note is she has been diagnosed with type 2 diabetes does take some oral hypoglycemics for this. I recommended that she discontinue Arimidex and we will see her in one  year with appropriate imaging studies in advance   Spent more than half the time coordinating care.    Pierce Crane, MD 11/5/20126:19 PM

## 2011-06-11 ENCOUNTER — Telehealth: Payer: Self-pay | Admitting: Oncology

## 2011-06-11 NOTE — Telephone Encounter (Signed)
mailed pts appt for nov2013 to her on 06/11/2011

## 2011-12-20 DIAGNOSIS — E119 Type 2 diabetes mellitus without complications: Secondary | ICD-10-CM | POA: Diagnosis not present

## 2011-12-20 DIAGNOSIS — E7800 Pure hypercholesterolemia, unspecified: Secondary | ICD-10-CM | POA: Diagnosis not present

## 2011-12-20 DIAGNOSIS — I10 Essential (primary) hypertension: Secondary | ICD-10-CM | POA: Diagnosis not present

## 2011-12-25 DIAGNOSIS — I10 Essential (primary) hypertension: Secondary | ICD-10-CM | POA: Diagnosis not present

## 2011-12-25 DIAGNOSIS — R7309 Other abnormal glucose: Secondary | ICD-10-CM | POA: Diagnosis not present

## 2012-04-27 DIAGNOSIS — M25569 Pain in unspecified knee: Secondary | ICD-10-CM | POA: Diagnosis not present

## 2012-05-01 DIAGNOSIS — Z23 Encounter for immunization: Secondary | ICD-10-CM | POA: Diagnosis not present

## 2012-05-04 ENCOUNTER — Other Ambulatory Visit: Payer: Self-pay | Admitting: Oncology

## 2012-05-04 DIAGNOSIS — Z853 Personal history of malignant neoplasm of breast: Secondary | ICD-10-CM

## 2012-05-13 ENCOUNTER — Ambulatory Visit
Admission: RE | Admit: 2012-05-13 | Discharge: 2012-05-13 | Disposition: A | Discharge status: Home / Self Care | Payer: Medicare Other | Source: Ambulatory Visit | Attending: Oncology | Admitting: Oncology

## 2012-05-13 DIAGNOSIS — R922 Inconclusive mammogram: Secondary | ICD-10-CM | POA: Diagnosis not present

## 2012-05-13 DIAGNOSIS — Z853 Personal history of malignant neoplasm of breast: Secondary | ICD-10-CM

## 2012-06-03 DIAGNOSIS — E119 Type 2 diabetes mellitus without complications: Secondary | ICD-10-CM | POA: Diagnosis not present

## 2012-06-03 DIAGNOSIS — H04129 Dry eye syndrome of unspecified lacrimal gland: Secondary | ICD-10-CM | POA: Diagnosis not present

## 2012-06-09 ENCOUNTER — Ambulatory Visit (HOSPITAL_BASED_OUTPATIENT_CLINIC_OR_DEPARTMENT_OTHER): Payer: Medicare Other | Admitting: Oncology

## 2012-06-09 ENCOUNTER — Other Ambulatory Visit (HOSPITAL_BASED_OUTPATIENT_CLINIC_OR_DEPARTMENT_OTHER): Payer: Medicare Other

## 2012-06-09 ENCOUNTER — Telehealth: Payer: Self-pay | Admitting: Oncology

## 2012-06-09 VITALS — BP 129/80 | HR 96 | Temp 97.6°F | Resp 20 | Ht 64.3 in | Wt 178.9 lb

## 2012-06-09 DIAGNOSIS — C50219 Malignant neoplasm of upper-inner quadrant of unspecified female breast: Secondary | ICD-10-CM | POA: Diagnosis not present

## 2012-06-09 DIAGNOSIS — C50919 Malignant neoplasm of unspecified site of unspecified female breast: Secondary | ICD-10-CM

## 2012-06-09 DIAGNOSIS — E559 Vitamin D deficiency, unspecified: Secondary | ICD-10-CM

## 2012-06-09 LAB — CBC WITH DIFFERENTIAL/PLATELET
Basophils Absolute: 0 10*3/uL (ref 0.0–0.1)
Eosinophils Absolute: 0.1 10*3/uL (ref 0.0–0.5)
HCT: 37.8 % (ref 34.8–46.6)
HGB: 13 g/dL (ref 11.6–15.9)
LYMPH%: 27.4 % (ref 14.0–49.7)
MCH: 32.6 pg (ref 25.1–34.0)
MCV: 94.5 fL (ref 79.5–101.0)
MONO%: 8.7 % (ref 0.0–14.0)
NEUT#: 3.3 10*3/uL (ref 1.5–6.5)
NEUT%: 61.4 % (ref 38.4–76.8)
Platelets: 227 10*3/uL (ref 145–400)

## 2012-06-09 LAB — COMPREHENSIVE METABOLIC PANEL (CC13)
Albumin: 4 g/dL (ref 3.5–5.0)
Alkaline Phosphatase: 53 U/L (ref 40–150)
BUN: 15 mg/dL (ref 7.0–26.0)
Creatinine: 0.8 mg/dL (ref 0.6–1.1)
Glucose: 153 mg/dl — ABNORMAL HIGH (ref 70–99)
Potassium: 3.8 mEq/L (ref 3.5–5.1)

## 2012-06-09 NOTE — Progress Notes (Signed)
Hematology and Oncology Follow Up Visit  Monique Figueroa [id number] May 15, 1934 78 y.o. 06/09/2012 3:03 PM   Principle Diagnosis: T1 invasive breast cancer associated DCIS, status post lumpectomy 06/19/2006, status post radiation therapy completed 09/19/2006 on Arimidex.  Interim History:  Ms. Monique Figueroa returns for followup. She is doing well. Her husband has recovered from his colon cancer surgery and chemotherapy. Ms. Monique Figueroa has been busy with a [employer] stew fundraiser which were raised about $7000 for her church. Her husband is also doing well. He has a appointment today as well. She has had no complaints. She is busy with her church and her family friends and is getting married in 2 weeks. Her last mammogram in October which was normal. Her labwork performed today was within normal limits as well. Her diabetic control pill is quite good.  Medications: I have reviewed the patient's current medications.  Allergies: Allergies not on file  Past Medical History, Surgical history, Social history, and Family History were reviewed and updated.  Review of Systems: Constitutional:  Negative for fever, chills, night sweats, anorexia, weight loss, pain. Cardiovascular: no chest pain or dyspnea on exertion Respiratory: no cough, shortness of breath, or wheezing Neurological: negative Dermatological: negative ENT: negative Skin Gastrointestinal: no abdominal pain, change in bowel habits, or black or bloody stools Genito-Urinary: no dysuria, trouble voiding, or hematuria Hematological and Lymphatic: negative Breast: negative for breast lumps Musculoskeletal: negative Remaining ROS negative.  Physical Exam: Blood pressure 129/80, pulse 96, temperature 97.6 F (36.4 C), resp. rate 20, height 5' 4.3" (1.633 m), weight 178 lb 14.4 oz (81.149 kg). ECOG:  General appearance: alert, cooperative and appears stated age Head: Normocephalic, without obvious abnormality, atraumatic Neck: no  adenopathy, no carotid bruit, no JVD, supple, symmetrical, trachea midline and thyroid not enlarged, symmetric, no tenderness/mass/nodules Lymph nodes: Cervical, supraclavicular, and axillary nodes normal. Breast: Both breasts are free of any obvious masses. The left breast status post lumpectomy. There is a scar which has healed well. No nipple retraction or skin changes. There is no discrete distortion in the central portion of the left breast. Cardiac exam: Normal rate and rhythm, no murmurs Lung exam: Normal air entry no rales or rhonchi Abdominal exam: No masses, no organomegaly  Extremities: Normal   Lab Results: Lab Results  Component Value Date   WBC 5.4 06/09/2012   HGB 13.0 06/09/2012   HCT 37.8 06/09/2012   MCV 94.5 06/09/2012   PLT 227 06/09/2012     Chemistry      Component Value Date/Time   NA 140 06/06/2011 1338   NA 140 06/06/2011 1338   NA 140 06/06/2011 1338   NA 140 06/06/2011 1338   K 3.2* 06/06/2011 1338   K 3.2* 06/06/2011 1338   K 3.2* 06/06/2011 1338   K 3.2* 06/06/2011 1338   CL 102 06/06/2011 1338   CL 102 06/06/2011 1338   CL 102 06/06/2011 1338   CL 102 06/06/2011 1338   CO2 25 06/06/2011 1338   CO2 25 06/06/2011 1338   CO2 25 06/06/2011 1338   CO2 25 06/06/2011 1338   BUN 15 06/06/2011 1338   BUN 15 06/06/2011 1338   BUN 15 06/06/2011 1338   BUN 15 06/06/2011 1338   CREATININE 0.82 06/06/2011 1338   CREATININE 0.82 06/06/2011 1338   CREATININE 0.82 06/06/2011 1338   CREATININE 0.82 06/06/2011 1338      Component Value Date/Time   CALCIUM 9.2 06/06/2011 1338   CALCIUM 9.2 06/06/2011 1338   CALCIUM 9.2  06/06/2011 1338   CALCIUM 9.2 06/06/2011 1338   ALKPHOS 49 06/06/2011 1338   ALKPHOS 49 06/06/2011 1338   ALKPHOS 49 06/06/2011 1338   ALKPHOS 49 06/06/2011 1338   AST 21 06/06/2011 1338   AST 21 06/06/2011 1338   AST 21 06/06/2011 1338   AST 21 06/06/2011 1338   ALT 13 06/06/2011 1338   ALT 13 06/06/2011 1338   ALT 13 06/06/2011 1338   ALT 13 06/06/2011 1338   BILITOT 0.4  06/06/2011 1338   BILITOT 0.4 06/06/2011 1338   BILITOT 0.4 06/06/2011 1338   BILITOT 0.4 06/06/2011 1338       Radiological Studies: chest X-ray  None  Impression and Plan: Ms. Monique Figueroa is doing well. I will continue to see an annual basis. We'll get a mammogram before next visit as well as lab work. I've encouraged her to call should she have any other concerns. Spent more than half the time coordinating care.    Pierce Crane, MD 11/5/20133:03 PM

## 2012-06-09 NOTE — Telephone Encounter (Signed)
gve the pt her oct,nov 2014 appt calendars. Pt [facility]

## 2012-06-26 DIAGNOSIS — I10 Essential (primary) hypertension: Secondary | ICD-10-CM | POA: Diagnosis not present

## 2012-06-26 DIAGNOSIS — R7309 Other abnormal glucose: Secondary | ICD-10-CM | POA: Diagnosis not present

## 2012-07-01 DIAGNOSIS — I10 Essential (primary) hypertension: Secondary | ICD-10-CM | POA: Diagnosis not present

## 2012-07-01 DIAGNOSIS — E119 Type 2 diabetes mellitus without complications: Secondary | ICD-10-CM | POA: Diagnosis not present

## 2012-07-01 DIAGNOSIS — E039 Hypothyroidism, unspecified: Secondary | ICD-10-CM | POA: Diagnosis not present

## 2012-07-01 DIAGNOSIS — E7800 Pure hypercholesterolemia, unspecified: Secondary | ICD-10-CM | POA: Diagnosis not present

## 2012-10-07 DIAGNOSIS — E7800 Pure hypercholesterolemia, unspecified: Secondary | ICD-10-CM | POA: Diagnosis not present

## 2012-10-07 DIAGNOSIS — E039 Hypothyroidism, unspecified: Secondary | ICD-10-CM | POA: Diagnosis not present

## 2012-10-24 ENCOUNTER — Telehealth: Payer: Self-pay | Admitting: Oncology

## 2012-10-24 ENCOUNTER — Encounter: Payer: Self-pay | Admitting: Oncology

## 2012-10-24 NOTE — Telephone Encounter (Signed)
lvm for pt regarding to NOV appt....gv LISA #...mailed pt letter and appt schedule for NOV....former pt of Dr Donnie Coffin r/s to Dr. Newt Lukes

## 2013-04-12 DIAGNOSIS — E7800 Pure hypercholesterolemia, unspecified: Secondary | ICD-10-CM | POA: Diagnosis not present

## 2013-04-12 DIAGNOSIS — Z1212 Encounter for screening for malignant neoplasm of rectum: Secondary | ICD-10-CM | POA: Diagnosis not present

## 2013-04-12 DIAGNOSIS — E039 Hypothyroidism, unspecified: Secondary | ICD-10-CM | POA: Diagnosis not present

## 2013-04-12 DIAGNOSIS — I10 Essential (primary) hypertension: Secondary | ICD-10-CM | POA: Diagnosis not present

## 2013-04-19 DIAGNOSIS — E039 Hypothyroidism, unspecified: Secondary | ICD-10-CM | POA: Diagnosis not present

## 2013-04-19 DIAGNOSIS — E7800 Pure hypercholesterolemia, unspecified: Secondary | ICD-10-CM | POA: Diagnosis not present

## 2013-04-19 DIAGNOSIS — I10 Essential (primary) hypertension: Secondary | ICD-10-CM | POA: Diagnosis not present

## 2013-04-19 DIAGNOSIS — E119 Type 2 diabetes mellitus without complications: Secondary | ICD-10-CM | POA: Diagnosis not present

## 2013-05-04 ENCOUNTER — Telehealth: Payer: Self-pay | Admitting: Oncology

## 2013-05-10 ENCOUNTER — Other Ambulatory Visit: Payer: Self-pay | Admitting: Oncology

## 2013-05-10 DIAGNOSIS — Z853 Personal history of malignant neoplasm of breast: Secondary | ICD-10-CM

## 2013-05-17 ENCOUNTER — Ambulatory Visit
Admission: RE | Admit: 2013-05-17 | Discharge: 2013-05-17 | Disposition: A | Discharge status: Home / Self Care | Payer: Medicare Other | Source: Ambulatory Visit | Attending: Oncology | Admitting: Oncology

## 2013-05-17 DIAGNOSIS — Z853 Personal history of malignant neoplasm of breast: Secondary | ICD-10-CM | POA: Diagnosis not present

## 2013-06-07 DIAGNOSIS — H43819 Vitreous degeneration, unspecified eye: Secondary | ICD-10-CM | POA: Diagnosis not present

## 2013-06-07 DIAGNOSIS — E119 Type 2 diabetes mellitus without complications: Secondary | ICD-10-CM | POA: Diagnosis not present

## 2013-06-07 DIAGNOSIS — H26499 Other secondary cataract, unspecified eye: Secondary | ICD-10-CM | POA: Diagnosis not present

## 2013-06-15 ENCOUNTER — Other Ambulatory Visit: Payer: Medicare Other | Admitting: Lab

## 2013-06-15 ENCOUNTER — Ambulatory Visit: Payer: Medicare Other | Admitting: Oncology

## 2013-06-23 ENCOUNTER — Ambulatory Visit (HOSPITAL_BASED_OUTPATIENT_CLINIC_OR_DEPARTMENT_OTHER): Payer: Medicare Other | Admitting: Family

## 2013-06-23 ENCOUNTER — Ambulatory Visit: Payer: Medicare Other | Admitting: Oncology

## 2013-06-23 ENCOUNTER — Encounter: Payer: Self-pay | Admitting: Family

## 2013-06-23 ENCOUNTER — Other Ambulatory Visit: Payer: Self-pay | Admitting: Adult Health

## 2013-06-23 ENCOUNTER — Other Ambulatory Visit (HOSPITAL_BASED_OUTPATIENT_CLINIC_OR_DEPARTMENT_OTHER): Payer: Medicare Other | Admitting: Lab

## 2013-06-23 ENCOUNTER — Telehealth: Payer: Self-pay | Admitting: *Deleted

## 2013-06-23 VITALS — BP 157/89 | HR 92 | Temp 98.0°F | Resp 18 | Ht 64.3 in | Wt 179.2 lb

## 2013-06-23 DIAGNOSIS — Z853 Personal history of malignant neoplasm of breast: Secondary | ICD-10-CM | POA: Diagnosis not present

## 2013-06-23 DIAGNOSIS — E559 Vitamin D deficiency, unspecified: Secondary | ICD-10-CM

## 2013-06-23 DIAGNOSIS — M899 Disorder of bone, unspecified: Secondary | ICD-10-CM

## 2013-06-23 DIAGNOSIS — C50919 Malignant neoplasm of unspecified site of unspecified female breast: Secondary | ICD-10-CM | POA: Insufficient documentation

## 2013-06-23 DIAGNOSIS — M8580 Other specified disorders of bone density and structure, unspecified site: Secondary | ICD-10-CM

## 2013-06-23 DIAGNOSIS — C50219 Malignant neoplasm of upper-inner quadrant of unspecified female breast: Secondary | ICD-10-CM

## 2013-06-23 LAB — COMPREHENSIVE METABOLIC PANEL (CC13)
Albumin: 3.7 g/dL (ref 3.5–5.0)
Alkaline Phosphatase: 54 U/L (ref 40–150)
BUN: 18.2 mg/dL (ref 7.0–26.0)
CO2: 27 mEq/L (ref 22–29)
Chloride: 105 mEq/L (ref 98–109)
Creatinine: 0.9 mg/dL (ref 0.6–1.1)
Glucose: 118 mg/dl (ref 70–140)
Potassium: 3.9 mEq/L (ref 3.5–5.1)
Sodium: 142 mEq/L (ref 136–145)
Total Protein: 7 g/dL (ref 6.4–8.3)

## 2013-06-23 LAB — CBC WITH DIFFERENTIAL/PLATELET
Basophils Absolute: 0 10*3/uL (ref 0.0–0.1)
Eosinophils Absolute: 0.1 10*3/uL (ref 0.0–0.5)
HCT: 37.3 % (ref 34.8–46.6)
MONO#: 0.6 10*3/uL (ref 0.1–0.9)
MONO%: 10.1 % (ref 0.0–14.0)
NEUT#: 3.3 10*3/uL (ref 1.5–6.5)
RBC: 3.99 10*6/uL (ref 3.70–5.45)
RDW: 13.3 % (ref 11.2–14.5)
WBC: 5.6 10*3/uL (ref 3.9–10.3)
lymph#: 1.6 10*3/uL (ref 0.9–3.3)

## 2013-06-23 NOTE — Telephone Encounter (Signed)
appts made and printed...td 

## 2013-06-23 NOTE — Progress Notes (Addendum)
[facility]  Telephone:([phone] Fax:([phone]) [phone]  OFFICE PROGRESS NOTE   PATIENT: Monique Figueroa   DOB: Aug 28, 1933  MR#: [id number]  GEX#:[id number]   CC:KIM, Fayrene Fearing, MD   DIAGNOSIS: AVIS TIRONE is a 79 y.o. female with a history of invasive ductal carcinoma of the left breast diagnosed in 06/2006.    PRIOR THERAPY: 1.  Status post left breast needle core biopsy on 06/05/2006 which showed ductal carcinoma in situ with apocrine features and associated microcalcifications, estrogen receptor 30% positive, progesterone receptor 13% positive.  2.  Bilateral breast MRI on 06/12/2006 showed despite the large abnormality seen mammographically, there is only subtle asymmetry of enhancement, seen within the medial portion of the left breast to measure 6.2 x 2.6 cm.  This is seen within the upper inner quadrant and correlates well with the location of the mammographic abnormality.  However, the MRI abnormality likely underestimates extent of disease based on the mammographic appearance which suggests at least 9 cm of suspicious calcifications.  No enlarged internal mammary or axillary lymph nodes are identified.   The right breast is unremarkable by MRI (clinical stage IIA, T2 N0).  3.  Status post left breast wire/needle localized lumpectomy with left axillary sentinel node biopsy on 06/19/2006 for a stage I, pT1a, pN0(i)(sn), pMX, 0.3 cm invasive ductal carcinoma, grade 1, with extensive high-grade ductal carcinoma in situ cribriform and papillary types with necrosis and calcifications, margins negative for invasive carcinoma and DCIS, 2 partially calcified fibroadenomas, fibrocystic changes associated with microcalcifications, estrogen receptor 38 % positive, progesterone receptor 93 % positive, Ki-67 10%, HER-2/neu 2+, no additional tissue available for FISH testing, with 0/1 metastatic left axillary lymph nodes.  4.  Radiation therapy from 07/30/2006 through  09/17/2006.  5.  Antiestrogen therapy with Arimidex started in 09/2006 and 5 years of antiestrogen therapy was completed in 09/2011.   CURRENT THERAPY:   Observation   INTERVAL HISTORY: Dr. Welton Flakes and I saw Carney Harder today for followup of invasive and in situ carcinoma of the left breast.  Since her last office visit with Dr. Donnie Coffin was on 06/09/2012, her interval history is significant for her husband battling colon cancer, and recently diagnosed with testicular lymphoma.  She reports hot flashes, vaginal dryness, night sweats, and arthritis in her hands all of which she states are tolerable.  The patient denies any other symptomatology.  Mrs. Mcphee is establishing herself with Dr. Milta Deiters service today.   PAST MEDICAL HISTORY: Past Medical History  Diagnosis Date  . Breast cancer 06/2006    Invasive ductal and DCIS of left breast  . GERD (gastroesophageal reflux disease)   . Hypertension   . Tuberculosis     medullary carcinoma  . Appendicitis   . Breast tumor     History of bilateral breast tumors/cysts  . Cyst of spinal meninges     PAST SURGICAL HISTORY: Past Surgical History  Procedure Laterality Date  . Breast cyst excision Bilateral     Several asprirations and excisions  . Appendectomy    . Breast lumpectomy with axillary lymph node biopsy Left 06/19/2006    Invasive ductal and in situ carcinoma, node negative  . Total thyroidectomy    . Spine surgery      FAMILY HISTORY: Family History  Problem Relation Age of Onset  . Rheum arthritis Mother   . Coronary artery disease Mother   . Dementia Mother   . Diabetes Mother   . Heart Problems Father   .  Cancer Sister     Breast cancer  . Cancer Brother     Lung cancer  . Diabetes Sister   . Dementia Sister     SOCIAL HISTORY: History  Substance Use Topics  . Smoking status: Never Smoker   . Smokeless tobacco: Never Used  . Alcohol Use: No    ALLERGIES: Allergies  Allergen Reactions  . Sulfa  Antibiotics Swelling     MEDICATIONS:  Current Outpatient Prescriptions  Medication Sig Dispense Refill  . hydrochlorothiazide (HYDRODIURIL) 25 MG tablet Take 25 mg by mouth every other day.       . levothyroxine (SYNTHROID, LEVOTHROID) 125 MCG tablet Take 125 mcg by mouth daily before breakfast.       . metFORMIN (GLUCOPHAGE) 500 MG tablet Take 500 mg by mouth daily with breakfast.       . Multiple Vitamin (MULTIVITAMINS PO) Take 1 tablet by mouth daily.      Marland Kitchen omeprazole (PRILOSEC) 20 MG capsule Take 20 mg by mouth daily.        No current facility-administered medications for this visit.      REVIEW OF SYSTEMS: A 10 point review of systems was completed and is negative except as noted above.  Mrs. Oriol denies any other symptomatology including  fatigue, fever or chills, headache, vision changes, swollen glands, cough or shortness of breath, chest pain or discomfort, nausea, vomiting, diarrhea, constipation, change in urinary or bowel habits, any other arthralgias/myalgias, unusual bleeding/bruising or any other symptomatology.   PHYSICAL EXAMINATION: BP 157/89  Pulse 92  Temp(Src) 98 F (36.7 C) (Oral)  Resp 18  Ht 5' 4.3" (1.633 m)  Wt 179 lb 3.2 oz (81.285 kg)  BMI 30.48 kg/m2   ECOG FS:  1 - Symptomatic but completely ambulatory  General appearance: Alert, cooperative, well nourished, no apparent distress Head: Normocephalic, without obvious abnormality, atraumatic Eyes: Conjunctivae/corneas clear, PERRLA, EOMI Nose: Nares, septum and mucosa are normal, no drainage or sinus tenderness Neck: No adenopathy, supple, symmetrical, trachea midline, no tenderness, well-healed thyroid area surgical scar Resp: Clear to auscultation bilaterally, no wheezes/rales/rhonchi Cardio: Regular rate and rhythm, S1, S2 normal, no murmur, click, rub or gallop, no edema Breasts:  Pendulous bilaterally, left and right breast have well-healed surgical scars, left breast architectural and  radiation changes with glandular breast tissue, right breast architectural changes noted with glandular breast tissue, bilateral axillary fullness GI: Soft, distended, non-tender, hypoactive bowel sounds, no organomegaly Skin: No rashes/lesions, skin warm and dry, no erythematous areas, no cyanosis, numerous blood moles on abdominal area, left axillary area abscess  M/S:  Atraumatic, normal strength in all extremities, normal range of motion, no clubbing  Lymph nodes: Cervical, supraclavicular, and axillary nodes normal Neurologic: Grossly normal, cranial nerves II through XII intact, alert and oriented x 3 Psych: Appropriate affect   LAB RESULTS: Lab Results  Component Value Date   WBC 5.6 06/23/2013   NEUTROABS 3.3 06/23/2013   HGB 12.5 06/23/2013   HCT 37.3 06/23/2013   MCV 93.4 06/23/2013   PLT 266 06/23/2013      Chemistry      Component Value Date/Time   NA 142 06/23/2013 1443   NA 140 06/06/2011 1338   K 3.9 06/23/2013 1443   K 3.2* 06/06/2011 1338   CL 104 06/09/2012 1402   CL 102 06/06/2011 1338   CO2 27 06/23/2013 1443   CO2 25 06/06/2011 1338   BUN 18.2 06/23/2013 1443   BUN 15 06/06/2011 1338   CREATININE 0.9  06/23/2013 1443   CREATININE 0.82 06/06/2011 1338      Component Value Date/Time   CALCIUM 9.3 06/23/2013 1443   CALCIUM 9.2 06/06/2011 1338   ALKPHOS 54 06/23/2013 1443   ALKPHOS 49 06/06/2011 1338   AST 19 06/23/2013 1443   AST 21 06/06/2011 1338   ALT 8 06/23/2013 1443   ALT 13 06/06/2011 1338   BILITOT 0.23 06/23/2013 1443   BILITOT 0.4 06/06/2011 1338       Lab Results  Component Value Date   LABCA2 25 06/06/2011    No components found with this basename: [id number]     RADIOGRAPHIC STUDIES: 1.  Mm Digital Diagnostic Bilat  05/17/2013   CLINICAL DATA:  A history of malignant lumpectomy of the left breast in 2007. Annual re-evaluation.  EXAM: DIGITAL DIAGNOSTIC  bilateral MAMMOGRAM with CAD  COMPARISON:  Previous examinations  ACR Breast Density  Category c: The breasts are heterogeneously dense, which may obscure small masses.  FINDINGS: There are stable scarring changes located within the superior portion of the left breast related to patient's lumpectomy. There are coarse benign-appearing calcifications located within the lumpectomy bed and also within the central portion of the right breast. These are stable. There is no specific evidence for recurrent tumor or developing malignancy within either breast.  Mammographic images were processed with CAD.  IMPRESSION: Stable breast parenchymal pattern. No findings worrisome for developing malignancy.  RECOMMENDATION: Bilateral diagnostic mammography in 1 year.  I have discussed the findings and recommendations with the patient. Results were also provided in writing at the conclusion of the visit. If applicable, a reminder letter will be sent to the patient regarding the next appointment.  BI-RADS CATEGORY  2: Benign Finding(s)   Electronically Signed   By: Anselmo Pickler M.D.   On: 05/17/2013 11:08   2.  bone density scan on 08/09/2010 showed a T score of -1.7 (osteopenia).   ASSESSMENT: CARALYNN GELBER is a  79 y.o. woman:  1.  Status post left breast needle core biopsy on 06/05/2006 which showed ductal carcinoma in situ with apocrine features and associated microcalcifications, estrogen receptor 30% positive, progesterone receptor 13% positive.  2.  Bilateral breast MRI on 06/12/2006 showed despite the large abnormality seen mammographically, there is only subtle asymmetry of enhancement, seen within the medial portion of the left breast to measure 6.2 x 2.6 cm.  This is seen within the upper inner quadrant and correlates well with the location of the mammographic abnormality.  However, the MRI abnormality likely underestimates extent of disease based on the mammographic appearance which suggests at least 9 cm of suspicious calcifications.  No enlarged internal mammary or axillary lymph nodes are  identified.   The right breast is unremarkable by MRI (clinical stage IIA, T2 N0).  3.  Status post left breast wire/needle localized lumpectomy with left axillary sentinel node biopsy on 06/19/2006 for a stage I, pT1a, pN0(i)(sn), pMX, 0.3 cm invasive ductal carcinoma, grade 1, with extensive high-grade ductal carcinoma in situ cribriform and papillary types with necrosis and calcifications, margins negative for invasive carcinoma and DCIS, 2 partially calcified fibroadenomas, fibrocystic changes associated with microcalcifications, estrogen receptor 38 % positive, progesterone receptor 93 % positive, Ki-67 10%, HER-2/neu 2+, no additional tissue available for FISH testing, with 0/1 metastatic left axillary lymph nodes.  4.  Radiation therapy from 07/30/2006 through 09/17/2006.  5.  Antiestrogen therapy with Arimidex started in 09/2006 and 5 years of antiestrogen therapy was completed in 09/2011.   PLAN:  1.  We will schedule Mrs. Falconi's bone density scan and her next bilateral digital diagnostic mammogram for her due in 05/2014.  Ms. Kroeze states her hot flashes, vaginal dryness, and night sweats are tolerable and does not want any additional medications.  2.  We plan to see Ms. Dipalma again in one year at which time we will check laboratories of CBC and CMP.  All questions were answered.  Taron LARISA LANIUS was encouraged to contact us in the interim with any problems, questions or concerns.    Larina Bras, NP-C 06/25/2013   4:19 PM  ATTENDING'S ATTESTATION:  I personally reviewed patient's chart, examined patient myself, formulated the treatment plan as followed.    Patient is a 79 year old female who was originally diagnosed with invasive ductal carcinoma of the left breast in November 2007. The tumor was ER positive PR positive. She subsequently had left lumpectomy with sentinel lymph node biopsy stage I disease. This was grade 1 with extensive DCIS. Postoperatively she did  receive adjuvant radiation therapy which he completed in February 2008. She was then begun on antiestrogen therapy adjuvantly with curative intent. She completed 5 years of this in Fabry 2013.  Patient has been on observation only under the care of Dr. Pierce Crane. She is now transferring her care to me. Overall patient is doing well and she has an excellent prognosis.  Drue Second, MD Medical/Oncology [facility] [phone] (beeper) [phone] (Office)  07/07/2013, 9:40 AM

## 2013-06-23 NOTE — Patient Instructions (Addendum)
Please contact us at [phone] if you have any questions or concerns.  Please continue to do well and enjoy life!!!  Get plenty of rest, drink plenty of water, exercise daily (walking), eat a balanced diet.  Take vitamin D3 1000 IUs daily.   Complete monthly self-breast examinations.  Have a clinical breast exam by a physician every year.  Have your mammogram completed every year.  Get bone density scan soon.  Apply warm compress to left axillary (armpit) area.  If you have any signs of infection - see Dr. Selena Batten.  Results for orders placed in visit on 06/23/13 (from the past 24 hour(s))  CBC WITH DIFFERENTIAL     Status: None   Collection Time    06/23/13  2:43 PM      Result Value Range   WBC 5.6  3.9 - 10.3 10e3/uL   NEUT# 3.3  1.5 - 6.5 10e3/uL   HGB 12.5  11.6 - 15.9 g/dL   HCT 95.6  21.3 - 08.6 %   Platelets 266  145 - 400 10e3/uL   MCV 93.4  79.5 - 101.0 fL   MCH 31.2  25.1 - 34.0 pg   MCHC 33.4  31.5 - 36.0 g/dL   RBC 5.78  4.69 - 6.29 10e6/uL   RDW 13.3  11.2 - 14.5 %   lymph# 1.6  0.9 - 3.3 10e3/uL   MONO# 0.6  0.1 - 0.9 10e3/uL   Eosinophils Absolute 0.1  0.0 - 0.5 10e3/uL   Basophils Absolute 0.0  0.0 - 0.1 10e3/uL   NEUT% 58.4  38.4 - 76.8 %   LYMPH% 28.7  14.0 - 49.7 %   MONO% 10.1  0.0 - 14.0 %   EOS% 2.1  0.0 - 7.0 %   BASO% 0.7  0.0 - 2.0 %   Narrative:    Performed At:  [facility]               [address]               [place], [place] [address]  COMPREHENSIVE METABOLIC PANEL (CC13)     Status: None   Collection Time    06/23/13  2:43 PM      Result Value Range   Sodium 142  136 - 145 mEq/L   Potassium 3.9  3.5 - 5.1 mEq/L   Chloride 105  98 - 109 mEq/L   CO2 27  22 - 29 mEq/L   Glucose 118  70 - 140 mg/dl   BUN 32.4  7.0 - 40.1 mg/dL   Creatinine 0.9  0.6 - 1.1 mg/dL   Total Bilirubin 0.27  0.20 - 1.20 mg/dL   Alkaline Phosphatase 54  40 - 150 U/L   AST 19  5 - 34 U/L   ALT 8  0 - 55 U/L   Total Protein 7.0  6.4 - 8.3 g/dL    Albumin 3.7  3.5 - 5.0 g/dL   Calcium 9.3  8.4 - 25.3 mg/dL   Anion Gap 10  3 - 11 mEq/L   Narrative:    Performed At:  [facility]               [address]               [place], [place] [address]

## 2013-07-07 DIAGNOSIS — H26499 Other secondary cataract, unspecified eye: Secondary | ICD-10-CM | POA: Diagnosis not present

## 2013-07-30 ENCOUNTER — Ambulatory Visit
Admission: RE | Admit: 2013-07-30 | Discharge: 2013-07-30 | Disposition: A | Discharge status: Home / Self Care | Payer: Medicare Other | Source: Ambulatory Visit | Attending: Family | Admitting: Family

## 2013-07-30 DIAGNOSIS — M899 Disorder of bone, unspecified: Secondary | ICD-10-CM | POA: Diagnosis not present

## 2013-07-30 DIAGNOSIS — Z853 Personal history of malignant neoplasm of breast: Secondary | ICD-10-CM

## 2013-07-30 DIAGNOSIS — M8580 Other specified disorders of bone density and structure, unspecified site: Secondary | ICD-10-CM

## 2013-10-13 DIAGNOSIS — E039 Hypothyroidism, unspecified: Secondary | ICD-10-CM | POA: Diagnosis not present

## 2013-10-13 DIAGNOSIS — I10 Essential (primary) hypertension: Secondary | ICD-10-CM | POA: Diagnosis not present

## 2013-10-13 DIAGNOSIS — E119 Type 2 diabetes mellitus without complications: Secondary | ICD-10-CM | POA: Diagnosis not present

## 2013-10-26 DIAGNOSIS — E7800 Pure hypercholesterolemia, unspecified: Secondary | ICD-10-CM | POA: Diagnosis not present

## 2013-10-26 DIAGNOSIS — E039 Hypothyroidism, unspecified: Secondary | ICD-10-CM | POA: Diagnosis not present

## 2013-10-26 DIAGNOSIS — E119 Type 2 diabetes mellitus without complications: Secondary | ICD-10-CM | POA: Diagnosis not present

## 2013-10-26 DIAGNOSIS — I10 Essential (primary) hypertension: Secondary | ICD-10-CM | POA: Diagnosis not present

## 2014-01-25 ENCOUNTER — Other Ambulatory Visit: Payer: Self-pay | Admitting: Oral Surgery

## 2014-01-25 DIAGNOSIS — R229 Localized swelling, mass and lump, unspecified: Principal | ICD-10-CM

## 2014-01-25 DIAGNOSIS — K0381 Cracked tooth: Secondary | ICD-10-CM | POA: Diagnosis not present

## 2014-01-25 DIAGNOSIS — IMO0002 Reserved for concepts with insufficient information to code with codable children: Secondary | ICD-10-CM

## 2014-01-26 LAB — CREATININE, SERUM: CREATININE: 0.74 mg/dL (ref 0.50–1.10)

## 2014-01-26 LAB — BUN: BUN: 13 mg/dL (ref 6–23)

## 2014-01-27 ENCOUNTER — Ambulatory Visit
Admission: RE | Admit: 2014-01-27 | Discharge: 2014-01-27 | Disposition: A | Discharge status: Home / Self Care | Payer: Medicare Other | Source: Ambulatory Visit | Attending: Oral Surgery | Admitting: Oral Surgery

## 2014-01-27 DIAGNOSIS — IMO0002 Reserved for concepts with insufficient information to code with codable children: Secondary | ICD-10-CM

## 2014-01-27 DIAGNOSIS — R229 Localized swelling, mass and lump, unspecified: Principal | ICD-10-CM

## 2014-01-27 MED ORDER — IOHEXOL 300 MG/ML  SOLN
75.0000 mL | Freq: Once | INTRAMUSCULAR | Status: AC | PRN
  Administered 2014-01-27: 75 mL via INTRAVENOUS

## 2014-04-27 DIAGNOSIS — E039 Hypothyroidism, unspecified: Secondary | ICD-10-CM | POA: Diagnosis not present

## 2014-04-27 DIAGNOSIS — I10 Essential (primary) hypertension: Secondary | ICD-10-CM | POA: Diagnosis not present

## 2014-04-27 DIAGNOSIS — E119 Type 2 diabetes mellitus without complications: Secondary | ICD-10-CM | POA: Diagnosis not present

## 2014-04-30 ENCOUNTER — Telehealth: Payer: Self-pay | Admitting: Hematology and Oncology

## 2014-04-30 NOTE — Telephone Encounter (Signed)
s/w pt re new appt for vg 11/19

## 2014-05-12 ENCOUNTER — Other Ambulatory Visit: Payer: Self-pay | Admitting: Hematology and Oncology

## 2014-05-12 DIAGNOSIS — Z853 Personal history of malignant neoplasm of breast: Secondary | ICD-10-CM

## 2014-05-18 ENCOUNTER — Encounter (INDEPENDENT_AMBULATORY_CARE_PROVIDER_SITE_OTHER): Payer: Self-pay

## 2014-05-18 ENCOUNTER — Ambulatory Visit
Admission: RE | Admit: 2014-05-18 | Discharge: 2014-05-18 | Disposition: A | Discharge status: Home / Self Care | Payer: Medicare Other | Source: Ambulatory Visit | Attending: Family | Admitting: Family

## 2014-05-18 DIAGNOSIS — Z853 Personal history of malignant neoplasm of breast: Secondary | ICD-10-CM | POA: Diagnosis not present

## 2014-05-31 DIAGNOSIS — Z23 Encounter for immunization: Secondary | ICD-10-CM | POA: Diagnosis not present

## 2014-06-08 DIAGNOSIS — R229 Localized swelling, mass and lump, unspecified: Secondary | ICD-10-CM | POA: Diagnosis not present

## 2014-06-08 DIAGNOSIS — E118 Type 2 diabetes mellitus with unspecified complications: Secondary | ICD-10-CM | POA: Diagnosis not present

## 2014-06-08 DIAGNOSIS — I10 Essential (primary) hypertension: Secondary | ICD-10-CM | POA: Diagnosis not present

## 2014-06-08 DIAGNOSIS — M25521 Pain in right elbow: Secondary | ICD-10-CM | POA: Diagnosis not present

## 2014-06-22 ENCOUNTER — Other Ambulatory Visit: Payer: Self-pay | Admitting: *Deleted

## 2014-06-22 DIAGNOSIS — Z853 Personal history of malignant neoplasm of breast: Secondary | ICD-10-CM

## 2014-06-23 ENCOUNTER — Other Ambulatory Visit (HOSPITAL_BASED_OUTPATIENT_CLINIC_OR_DEPARTMENT_OTHER): Payer: Medicare Other

## 2014-06-23 ENCOUNTER — Ambulatory Visit (HOSPITAL_BASED_OUTPATIENT_CLINIC_OR_DEPARTMENT_OTHER): Payer: Medicare Other | Admitting: Hematology and Oncology

## 2014-06-23 ENCOUNTER — Telehealth: Payer: Self-pay | Admitting: Hematology and Oncology

## 2014-06-23 DIAGNOSIS — L748 Other eccrine sweat disorders: Secondary | ICD-10-CM

## 2014-06-23 DIAGNOSIS — Z853 Personal history of malignant neoplasm of breast: Secondary | ICD-10-CM

## 2014-06-23 DIAGNOSIS — C50512 Malignant neoplasm of lower-outer quadrant of left female breast: Secondary | ICD-10-CM

## 2014-06-23 LAB — COMPREHENSIVE METABOLIC PANEL (CC13)
ALBUMIN: 3.8 g/dL (ref 3.5–5.0)
ALT: 12 U/L (ref 0–55)
ANION GAP: 8 meq/L (ref 3–11)
AST: 18 U/L (ref 5–34)
Alkaline Phosphatase: 47 U/L (ref 40–150)
BUN: 15.3 mg/dL (ref 7.0–26.0)
CALCIUM: 9.3 mg/dL (ref 8.4–10.4)
CHLORIDE: 104 meq/L (ref 98–109)
CO2: 29 meq/L (ref 22–29)
Creatinine: 0.9 mg/dL (ref 0.6–1.1)
GLUCOSE: 145 mg/dL — AB (ref 70–140)
POTASSIUM: 4.2 meq/L (ref 3.5–5.1)
SODIUM: 140 meq/L (ref 136–145)
TOTAL PROTEIN: 6.7 g/dL (ref 6.4–8.3)
Total Bilirubin: 0.3 mg/dL (ref 0.20–1.20)

## 2014-06-23 LAB — CBC WITH DIFFERENTIAL/PLATELET
BASO%: 0.7 % (ref 0.0–2.0)
Basophils Absolute: 0 10*3/uL (ref 0.0–0.1)
EOS ABS: 0.1 10*3/uL (ref 0.0–0.5)
EOS%: 2 % (ref 0.0–7.0)
HEMATOCRIT: 35.9 % (ref 34.8–46.6)
HGB: 12.1 g/dL (ref 11.6–15.9)
LYMPH#: 2 10*3/uL (ref 0.9–3.3)
LYMPH%: 33.2 % (ref 14.0–49.7)
MCH: 31.2 pg (ref 25.1–34.0)
MCHC: 33.7 g/dL (ref 31.5–36.0)
MCV: 92.5 fL (ref 79.5–101.0)
MONO#: 0.5 10*3/uL (ref 0.1–0.9)
MONO%: 8.4 % (ref 0.0–14.0)
NEUT%: 55.7 % (ref 38.4–76.8)
NEUTROS ABS: 3.3 10*3/uL (ref 1.5–6.5)
PLATELETS: 235 10*3/uL (ref 145–400)
RBC: 3.88 10*6/uL (ref 3.70–5.45)
RDW: 13.8 % (ref 11.2–14.5)
WBC: 6 10*3/uL (ref 3.9–10.3)

## 2014-06-23 NOTE — Progress Notes (Signed)
Patient Care Team: [name], MD as PCP - General (Internal Medicine)  DIAGNOSIS: No matching staging information was found for the patient.  PRIOR THERAPY: 1. Status post left breast needle core biopsy on 06/05/2006 which showed ductal carcinoma in situ with apocrine features and associated microcalcifications, estrogen receptor 30% positive, progesterone receptor 13% positive.  2. Status post left breast wire/needle localized lumpectomy with left axillary sentinel node biopsy on 06/19/2006 for a stage I, pT1a, pN0(i)(sn), pMX, 0.3 cm invasive ductal carcinoma, grade 1, with extensive high-grade ductal carcinoma in situ cribriform and papillary types with necrosis and calcifications, margins negative for invasive carcinoma and DCIS, 2 partially calcified fibroadenomas, fibrocystic changes associated with microcalcifications, estrogen receptor 38 % positive, progesterone receptor 93 % positive, Ki-67 10%, HER-2/neu 2+, no additional tissue available for FISH testing, with 0/1 metastatic left axillary lymph nodes.  4. Radiation therapy from 07/30/2006 through 09/17/2006.  5. Antiestrogen therapy with Arimidex started in 09/2006 and 5 years of antiestrogen therapy was completed in 09/2011.   CURRENT THERAPY: Observation CHIEF COMPLIANT: followup of breast cancer  INTERVAL HISTORY: Monique Figueroa is a 80 year old Caucasian lady with above-mentioned history of left breast cancer treated with lumpectomy and radiation. She remains on antiestrogen therapy until 2013. Completing 5 years. She has been here annually for followup. She denies any new lumps or nodules. She feels great. She denies any new health issues.  REVIEW OF SYSTEMS:   Constitutional: Denies fevers, chills or abnormal weight loss Eyes: Denies blurriness of vision Ears, nose, mouth, throat, and face: Denies mucositis or sore throat Respiratory: Denies cough, dyspnea or wheezes Cardiovascular: Denies palpitation, chest  discomfort or lower extremity swelling Gastrointestinal:  Denies nausea, heartburn or change in bowel habits Skin: Denies abnormal skin rashes Lymphatics: Denies new lymphadenopathy or easy bruising Neurological:Denies numbness, tingling or new weaknesses Behavioral/Psych: Mood is stable, no new changes  Breast:  denies any pain or lumps or nodules in either breasts All other systems were reviewed with the patient and are negative.  I have reviewed the past medical history, past surgical history, social history and family history with the patient and they are unchanged from previous note.  ALLERGIES:  is allergic to sulfa antibiotics.  MEDICATIONS:  Current Outpatient Prescriptions  Medication Sig Dispense Refill  . hydrochlorothiazide (HYDRODIURIL) 25 MG tablet Take 25 mg by mouth every other day.     . levothyroxine (SYNTHROID, LEVOTHROID) 125 MCG tablet Take 125 mcg by mouth daily before breakfast.     . metFORMIN (GLUCOPHAGE) 500 MG tablet Take 500 mg by mouth daily with breakfast.     . Multiple Vitamin (MULTIVITAMINS PO) Take 1 tablet by mouth daily.    Marland Kitchen omeprazole (PRILOSEC) 20 MG capsule Take 20 mg by mouth daily.     . VOLTAREN 1 % GEL   2   No current facility-administered medications for this visit.    PHYSICAL EXAMINATION: ECOG PERFORMANCE STATUS: 0 - Asymptomatic  Filed Vitals:   06/23/14 1457  BP: 133/66  Pulse: 91  Temp: 98.2 F (36.8 C)  Resp: 18   Filed Weights   06/23/14 1457  Weight: 177 lb (80.287 kg)    GENERAL:alert, no distress and comfortable SKIN: skin color, texture, turgor are normal, no rashes or significant lesions EYES: normal, Conjunctiva are pink and non-injected, sclera clear OROPHARYNX:no exudate, no erythema and lips, buccal mucosa, and tongue normal  NECK: supple, thyroid normal size, non-tender, without nodularity LYMPH:  no palpable lymphadenopathy in the cervical, axillary or inguinal  LUNGS: clear to auscultation and percussion  with normal breathing effort HEART: regular rate & rhythm and no murmurs and no lower extremity edema ABDOMEN:abdomen soft, non-tender and normal bowel sounds Musculoskeletal:no cyanosis of digits and no clubbing  NEURO: alert & oriented x 3 with fluent speech, no focal motor/sensory deficits BREAST: No palpable masses or nodules in either right or left breasts. No palpable axillary supraclavicular or infraclavicular adenopathy no breast tenderness or nipple discharge.   LABORATORY DATA:  I have reviewed the data as listed   Chemistry      Component Value Date/Time   NA 140 06/23/2014 1422   NA 140 06/06/2011 1338   K 4.2 06/23/2014 1422   K 3.2* 06/06/2011 1338   CL 104 06/09/2012 1402   CL 102 06/06/2011 1338   CO2 29 06/23/2014 1422   CO2 25 06/06/2011 1338   BUN 15.3 06/23/2014 1422   BUN 13 01/25/2014 1155   CREATININE 0.9 06/23/2014 1422   CREATININE 0.74 01/25/2014 1155   CREATININE 0.82 06/06/2011 1338      Component Value Date/Time   CALCIUM 9.3 06/23/2014 1422   CALCIUM 9.2 06/06/2011 1338   ALKPHOS 47 06/23/2014 1422   ALKPHOS 49 06/06/2011 1338   AST 18 06/23/2014 1422   AST 21 06/06/2011 1338   ALT 12 06/23/2014 1422   ALT 13 06/06/2011 1338   BILITOT 0.30 06/23/2014 1422   BILITOT 0.4 06/06/2011 1338       Lab Results  Component Value Date   WBC 6.0 06/23/2014   HGB 12.1 06/23/2014   HCT 35.9 06/23/2014   MCV 92.5 06/23/2014   PLT 235 06/23/2014   NEUTROABS 3.3 06/23/2014     RADIOGRAPHIC STUDIES: I have personally reviewed the radiology reports and agreed with their findings. No results found.   ASSESSMENT & PLAN:  History of breast cancer Left breast cancer invasive ductal carcinoma with DCIS T1 A. N0 M0 stage IA status post lumpectomy 06/19/2006 grade 1 ER 38%, PR 90%, Ki-67 10%, HER-2 plus by IHC, one lymph node negative, status post radiation therapy and Arimidex from 2008 to 2013 currently on observation  Surveillance: breast exam was  normal. Mammograms done in 05/2014 were reviewed and they were normal. Continue with annual physical exams and mammogram followup.  Survivorship: Encouraged her to stay active. 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 She also helps take care of her husband where the couple of cancers including a testicular lymphoma.  Cyst underneath the left axilla: This is a very small cyst but it is bothering her brought in would like it to be removed. I will refer her to see Gen. Surgery.  Return to clinic in one year for follow   Orders Placed This Encounter  Procedures  . Ambulatory referral to General Surgery    Referral Priority:  Routine    Referral Type:  Surgical    Referral Reason:  Specialty Services Required    Requested Specialty:  General Surgery    Number of Visits Requested:  1   The patient has a good understanding of the overall plan. she agrees with it. She will call with any problems that may develop before her next visit here.   Rulon Eisenmenger, MD 06/23/2014 3:46 PM

## 2014-06-23 NOTE — Assessment & Plan Note (Signed)
Left breast cancer invasive ductal carcinoma with DCIS T1 A. N0 M0 stage IA status post lumpectomy 06/19/2006 grade 1 ER 38%, PR 90%, Ki-67 10%, HER-2 plus by IHC, one lymph node negative, status post radiation therapy and Arimidex from 2008 to 2013 currently on observation  Surveillance: breast exam was normal. Mammograms done in 05/2014 were reviewed and they were normal. Continue with annual physical exams and mammogram followup.  Survivorship: Encouraged her to stay active. 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 She also helps take care of her husband where the couple of cancers including a testicular lymphoma.  Cyst underneath the left axilla: This is a very small cyst but it is bothering her brought in would like it to be removed. I will refer her to see Gen. Surgery.  Return to clinic in one year for follow

## 2014-06-23 NOTE — Telephone Encounter (Signed)
, °

## 2014-07-06 ENCOUNTER — Other Ambulatory Visit (INDEPENDENT_AMBULATORY_CARE_PROVIDER_SITE_OTHER): Payer: Self-pay | Admitting: General Surgery

## 2014-07-06 DIAGNOSIS — I10 Essential (primary) hypertension: Secondary | ICD-10-CM | POA: Diagnosis not present

## 2014-07-06 DIAGNOSIS — L723 Sebaceous cyst: Secondary | ICD-10-CM | POA: Diagnosis not present

## 2014-07-06 DIAGNOSIS — E119 Type 2 diabetes mellitus without complications: Secondary | ICD-10-CM | POA: Diagnosis not present

## 2014-07-06 DIAGNOSIS — Z853 Personal history of malignant neoplasm of breast: Secondary | ICD-10-CM | POA: Diagnosis not present

## 2014-07-08 DIAGNOSIS — E119 Type 2 diabetes mellitus without complications: Secondary | ICD-10-CM | POA: Diagnosis not present

## 2014-11-14 ENCOUNTER — Telehealth: Payer: Self-pay | Admitting: Hematology and Oncology

## 2014-11-14 NOTE — Telephone Encounter (Signed)
Called and left a message with her new appointment due to dr [name] on call

## 2014-12-07 DIAGNOSIS — E039 Hypothyroidism, unspecified: Secondary | ICD-10-CM | POA: Diagnosis not present

## 2014-12-07 DIAGNOSIS — E118 Type 2 diabetes mellitus with unspecified complications: Secondary | ICD-10-CM | POA: Diagnosis not present

## 2014-12-07 DIAGNOSIS — I10 Essential (primary) hypertension: Secondary | ICD-10-CM | POA: Diagnosis not present

## 2014-12-13 DIAGNOSIS — E119 Type 2 diabetes mellitus without complications: Secondary | ICD-10-CM | POA: Diagnosis not present

## 2014-12-13 DIAGNOSIS — E039 Hypothyroidism, unspecified: Secondary | ICD-10-CM | POA: Diagnosis not present

## 2014-12-13 DIAGNOSIS — I10 Essential (primary) hypertension: Secondary | ICD-10-CM | POA: Diagnosis not present

## 2014-12-13 DIAGNOSIS — E780 Pure hypercholesterolemia: Secondary | ICD-10-CM | POA: Diagnosis not present

## 2015-02-12 ENCOUNTER — Encounter (HOSPITAL_COMMUNITY): Payer: Self-pay | Admitting: Emergency Medicine

## 2015-02-12 ENCOUNTER — Emergency Department (HOSPITAL_COMMUNITY): Payer: Medicare Other

## 2015-02-12 ENCOUNTER — Emergency Department (HOSPITAL_COMMUNITY)
Admission: EM | Admit: 2015-02-12 | Discharge: 2015-02-12 | Disposition: A | Discharge status: Home / Self Care | Payer: Medicare Other | Attending: Emergency Medicine | Admitting: Emergency Medicine

## 2015-02-12 DIAGNOSIS — Z8611 Personal history of tuberculosis: Secondary | ICD-10-CM | POA: Diagnosis not present

## 2015-02-12 DIAGNOSIS — K219 Gastro-esophageal reflux disease without esophagitis: Secondary | ICD-10-CM | POA: Insufficient documentation

## 2015-02-12 DIAGNOSIS — T148 Other injury of unspecified body region: Secondary | ICD-10-CM | POA: Diagnosis not present

## 2015-02-12 DIAGNOSIS — S0990XA Unspecified injury of head, initial encounter: Secondary | ICD-10-CM | POA: Diagnosis not present

## 2015-02-12 DIAGNOSIS — M47812 Spondylosis without myelopathy or radiculopathy, cervical region: Secondary | ICD-10-CM | POA: Diagnosis not present

## 2015-02-12 DIAGNOSIS — W010XXA Fall on same level from slipping, tripping and stumbling without subsequent striking against object, initial encounter: Secondary | ICD-10-CM | POA: Insufficient documentation

## 2015-02-12 DIAGNOSIS — S0093XA Contusion of unspecified part of head, initial encounter: Secondary | ICD-10-CM | POA: Diagnosis not present

## 2015-02-12 DIAGNOSIS — Y939 Activity, unspecified: Secondary | ICD-10-CM | POA: Diagnosis not present

## 2015-02-12 DIAGNOSIS — Y929 Unspecified place or not applicable: Secondary | ICD-10-CM | POA: Diagnosis not present

## 2015-02-12 DIAGNOSIS — M79641 Pain in right hand: Secondary | ICD-10-CM | POA: Diagnosis not present

## 2015-02-12 DIAGNOSIS — Z8669 Personal history of other diseases of the nervous system and sense organs: Secondary | ICD-10-CM | POA: Diagnosis not present

## 2015-02-12 DIAGNOSIS — Z79899 Other long term (current) drug therapy: Secondary | ICD-10-CM | POA: Diagnosis not present

## 2015-02-12 DIAGNOSIS — Z853 Personal history of malignant neoplasm of breast: Secondary | ICD-10-CM | POA: Diagnosis not present

## 2015-02-12 DIAGNOSIS — Y999 Unspecified external cause status: Secondary | ICD-10-CM | POA: Diagnosis not present

## 2015-02-12 DIAGNOSIS — S199XXA Unspecified injury of neck, initial encounter: Secondary | ICD-10-CM | POA: Diagnosis not present

## 2015-02-12 MED ORDER — ONDANSETRON 4 MG PO TBDP: ORAL_TABLET | ORAL | Status: DC

## 2015-02-12 MED ORDER — ONDANSETRON 4 MG PO TBDP
4.0000 mg | ORAL_TABLET | Freq: Once | ORAL | Status: AC
  Administered 2015-02-12: 4 mg via ORAL
  Filled 2015-02-12: qty 1

## 2015-02-12 MED ORDER — ONDANSETRON HCL 4 MG/2ML IJ SOLN: 4.0000 mg | Freq: Once | INTRAMUSCULAR | Status: DC

## 2015-02-12 NOTE — ED Notes (Signed)
Pt arrived via EMS with report of slipping and falling and hitting head on ground. Pt has hematoma to posterior head. (-)LOC, (+)nausea, PERRLA, (-)dizziness/lightheadedness and  (+)PMS.

## 2015-02-12 NOTE — ED Notes (Signed)
Awake. Verbally responsive. A/O x4. Resp even and unlabored. No audible adventitious breath sounds noted. ABC's intact. Family at bedside. 

## 2015-02-12 NOTE — Discharge Instructions (Signed)
Follow up with your md if problems °

## 2015-02-12 NOTE — ED Notes (Signed)
Pt taken to CT scan and returned to room without distress noted. 

## 2015-02-12 NOTE — ED Notes (Signed)
Bed: [facility] Expected date:  Expected time:  Means of arrival:  Comments: fall 

## 2015-02-12 NOTE — ED Notes (Addendum)
Awake. Verbally responsive. A/O x4. Resp even and unlabored. No audible adventitious breath sounds noted. ABC's intact.  

## 2015-02-15 NOTE — ED Provider Notes (Signed)
CSN: [id number]     Arrival date & time 02/12/15  1357 History   First MD Initiated Contact with Patient 02/12/15 1417     Chief Complaint  Patient presents with  . Fall     (Consider location/radiation/quality/duration/timing/severity/associated sxs/prior Treatment) Patient is a 81 y.o. female presenting with fall. The history is provided by the patient (the pt states she slipped and fell and hit the back of her head.  no loc, but she felt a little dizzy,  pt has some nausea now).  Fall This is a new problem. The current episode started 3 to 5 hours ago. The problem occurs constantly. The problem has been resolved. Associated symptoms include headaches. Pertinent negatives include no chest pain and no abdominal pain. Nothing aggravates the symptoms. Nothing relieves the symptoms.    Past Medical History  Diagnosis Date  . Breast cancer 06/2006    Invasive ductal and DCIS of left breast  . GERD (gastroesophageal reflux disease)   . Hypertension   . Tuberculosis     medullary carcinoma  . Appendicitis   . Breast tumor     History of bilateral breast tumors/cysts  . Cyst of spinal meninges    Past Surgical History  Procedure Laterality Date  . Breast cyst excision Bilateral     Several asprirations and excisions  . Appendectomy    . Breast lumpectomy with axillary lymph node biopsy Left 06/19/2006    Invasive ductal and in situ carcinoma, node negative  . Total thyroidectomy    . Spine surgery     Family History  Problem Relation Age of Onset  . Rheum arthritis Mother   . Coronary artery disease Mother   . Dementia Mother   . Diabetes Mother   . Heart Problems Father   . Cancer Sister     Breast cancer  . Cancer Brother     Lung cancer  . Diabetes Sister   . Dementia Sister    History  Substance Use Topics  . Smoking status: Never Smoker   . Smokeless tobacco: Never Used  . Alcohol Use: No   OB History    No data available     Review of Systems   Constitutional: Negative for appetite change and fatigue.  HENT: Negative for congestion, ear discharge and sinus pressure.        Headache  Eyes: Negative for discharge.  Respiratory: Negative for cough.   Cardiovascular: Negative for chest pain.  Gastrointestinal: Negative for abdominal pain and diarrhea.  Genitourinary: Negative for frequency and hematuria.  Musculoskeletal: Negative for back pain.  Skin: Negative for rash.  Neurological: Positive for headaches. Negative for seizures.  Psychiatric/Behavioral: Negative for hallucinations.      Allergies  Sulfa antibiotics  Home Medications   Prior to Admission medications   Medication Sig Start Date End Date Taking? Authorizing Provider  acetaminophen (TYLENOL) 500 MG tablet Take 1,000 mg by mouth every 6 (six) hours as needed for mild pain, moderate pain or headache.   Yes Historical Provider, MD  hydrochlorothiazide (HYDRODIURIL) 25 MG tablet Take 25 mg by mouth daily.  06/01/13  Yes Historical Provider, MD  ibuprofen (ADVIL,MOTRIN) 200 MG tablet Take 200 mg by mouth every 6 (six) hours as needed for headache, mild pain or moderate pain.   Yes Historical Provider, MD  levothyroxine (SYNTHROID, LEVOTHROID) 125 MCG tablet Take 125 mcg by mouth daily before breakfast.  03/26/13  Yes Historical Provider, MD  metFORMIN (GLUCOPHAGE) 500 MG tablet Take 500 mg  by mouth daily with breakfast.  06/04/13  Yes Historical Provider, MD  Multiple Vitamin (MULTIVITAMINS PO) Take 1 tablet by mouth daily.   Yes Historical Provider, MD  omeprazole (PRILOSEC) 20 MG capsule Take 20 mg by mouth daily.  05/02/13  Yes Historical Provider, MD  vitamin C (ASCORBIC ACID) 500 MG tablet Take 500 mg by mouth daily.   Yes Historical Provider, MD  VITAMIN D, CHOLECALCIFEROL, PO Take 1 tablet by mouth daily.   Yes Historical Provider, MD  VOLTAREN 1 % GEL Apply 2 g topically daily as needed (arthritis pain).  06/08/14  Yes Historical Provider, MD  ondansetron  (ZOFRAN ODT) 4 MG disintegrating tablet 4mg  ODT q4 hours prn nausea/vomit 02/12/15   Milton Ferguson, MD   BP 128/72 mmHg  Pulse 78  Temp(Src) 98.5 F (36.9 C)  Resp 16  SpO2 97% Physical Exam  Constitutional: She is oriented to person, place, and time. She appears well-developed.  HENT:  Head: Normocephalic.  Swelling occipital area  Eyes: Conjunctivae and EOM are normal. No scleral icterus.  Neck: Neck supple. No thyromegaly present.  Cardiovascular: Normal rate and regular rhythm.  Exam reveals no gallop and no friction rub.   No murmur heard. Pulmonary/Chest: No stridor. She has no wheezes. She has no rales. She exhibits no tenderness.  Abdominal: She exhibits no distension. There is no tenderness. There is no rebound.  Musculoskeletal: Normal range of motion. She exhibits no edema.  Lymphadenopathy:    She has no cervical adenopathy.  Neurological: She is oriented to person, place, and time. No cranial nerve deficit. She exhibits normal muscle tone. Coordination normal.  Skin: No rash noted. No erythema.  Psychiatric: She has a normal mood and affect. Her behavior is normal.    ED Course  Procedures (including critical care time) Labs Review Labs Reviewed - No data to display  Imaging Review No results found.   EKG Interpretation None      MDM   Final diagnoses:  Head contusion, initial encounter    Pt with head contusion and nl ct head and ct cervical spine.  She is given zofran and will follow up prn    Milton Ferguson, MD 02/15/15 1036

## 2015-05-08 ENCOUNTER — Other Ambulatory Visit: Payer: Self-pay

## 2015-05-08 DIAGNOSIS — Z1231 Encounter for screening mammogram for malignant neoplasm of breast: Secondary | ICD-10-CM

## 2015-05-30 ENCOUNTER — Ambulatory Visit
Admission: RE | Admit: 2015-05-30 | Discharge: 2015-05-30 | Disposition: A | Discharge status: Home / Self Care | Payer: Medicare Other | Source: Ambulatory Visit

## 2015-05-30 ENCOUNTER — Ambulatory Visit: Payer: Medicare Other

## 2015-05-30 DIAGNOSIS — Z1231 Encounter for screening mammogram for malignant neoplasm of breast: Secondary | ICD-10-CM | POA: Diagnosis not present

## 2015-06-15 DIAGNOSIS — E119 Type 2 diabetes mellitus without complications: Secondary | ICD-10-CM | POA: Diagnosis not present

## 2015-06-15 DIAGNOSIS — I10 Essential (primary) hypertension: Secondary | ICD-10-CM | POA: Diagnosis not present

## 2015-06-15 DIAGNOSIS — E039 Hypothyroidism, unspecified: Secondary | ICD-10-CM | POA: Diagnosis not present

## 2015-06-20 DIAGNOSIS — E7800 Pure hypercholesterolemia, unspecified: Secondary | ICD-10-CM | POA: Diagnosis not present

## 2015-06-20 DIAGNOSIS — R739 Hyperglycemia, unspecified: Secondary | ICD-10-CM | POA: Diagnosis not present

## 2015-06-20 DIAGNOSIS — E039 Hypothyroidism, unspecified: Secondary | ICD-10-CM | POA: Diagnosis not present

## 2015-06-20 DIAGNOSIS — I10 Essential (primary) hypertension: Secondary | ICD-10-CM | POA: Diagnosis not present

## 2015-06-26 ENCOUNTER — Other Ambulatory Visit: Payer: Medicare Other

## 2015-06-26 ENCOUNTER — Other Ambulatory Visit: Payer: Self-pay | Admitting: *Deleted

## 2015-06-26 ENCOUNTER — Ambulatory Visit: Payer: Medicare Other | Admitting: Hematology and Oncology

## 2015-06-26 DIAGNOSIS — Z853 Personal history of malignant neoplasm of breast: Secondary | ICD-10-CM

## 2015-06-26 NOTE — Assessment & Plan Note (Signed)
Left breast cancer invasive ductal carcinoma with DCIS T1 A. N0 M0 stage IA status post lumpectomy 06/19/2006 grade 1 ER 38%, PR 90%, Ki-67 10%, HER-2 plus by IHC, one lymph node negative, status post radiation therapy and Arimidex from 2008 to 2013 currently on observation  Surveillance: breast exam was normal. Mammograms done in 05/2014 were reviewed and they were normal. Continue with annual physical exams and mammogram followup.  Survivorship: Encouraged her to stay active. 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 She also helps take care of her husband where the couple of cancers including a testicular lymphoma.  Cyst underneath the left axilla: Gen. Surgery.  Return to clinic in one year for follow up with survivorship clinic

## 2015-06-27 ENCOUNTER — Ambulatory Visit (HOSPITAL_BASED_OUTPATIENT_CLINIC_OR_DEPARTMENT_OTHER): Payer: Medicare Other | Admitting: Hematology and Oncology

## 2015-06-27 ENCOUNTER — Telehealth: Payer: Self-pay | Admitting: Hematology and Oncology

## 2015-06-27 ENCOUNTER — Encounter: Payer: Self-pay | Admitting: Hematology and Oncology

## 2015-06-27 ENCOUNTER — Other Ambulatory Visit (HOSPITAL_BASED_OUTPATIENT_CLINIC_OR_DEPARTMENT_OTHER): Payer: Medicare Other

## 2015-06-27 VITALS — BP 147/76 | HR 86 | Temp 98.2°F | Resp 18 | Ht 64.25 in | Wt 176.1 lb

## 2015-06-27 DIAGNOSIS — Z853 Personal history of malignant neoplasm of breast: Secondary | ICD-10-CM | POA: Diagnosis not present

## 2015-06-27 LAB — COMPREHENSIVE METABOLIC PANEL (CC13)
ALT: 12 U/L (ref 0–55)
AST: 20 U/L (ref 5–34)
Albumin: 3.9 g/dL (ref 3.5–5.0)
Alkaline Phosphatase: 51 U/L (ref 40–150)
Anion Gap: 11 mEq/L (ref 3–11)
BUN: 17.8 mg/dL (ref 7.0–26.0)
CHLORIDE: 104 meq/L (ref 98–109)
CO2: 27 meq/L (ref 22–29)
Calcium: 9.1 mg/dL (ref 8.4–10.4)
Creatinine: 1.1 mg/dL (ref 0.6–1.1)
EGFR: 50 mL/min/{1.73_m2} — ABNORMAL LOW (ref >90)
GLUCOSE: 96 mg/dL (ref 70–140)
POTASSIUM: 4.1 meq/L (ref 3.5–5.1)
SODIUM: 142 meq/L (ref 136–145)
Total Bilirubin: 0.37 mg/dL (ref 0.20–1.20)
Total Protein: 6.9 g/dL (ref 6.4–8.3)

## 2015-06-27 LAB — CBC WITH DIFFERENTIAL/PLATELET
BASO%: 0.5 % (ref 0.0–2.0)
BASOS ABS: 0 10*3/uL (ref 0.0–0.1)
EOS%: 2 % (ref 0.0–7.0)
Eosinophils Absolute: 0.1 10*3/uL (ref 0.0–0.5)
HCT: 38.7 % (ref 34.8–46.6)
HEMOGLOBIN: 12.9 g/dL (ref 11.6–15.9)
LYMPH%: 29.1 % (ref 14.0–49.7)
MCH: 31.5 pg (ref 25.1–34.0)
MCHC: 33.3 g/dL (ref 31.5–36.0)
MCV: 94.6 fL (ref 79.5–101.0)
MONO#: 0.6 10*3/uL (ref 0.1–0.9)
MONO%: 10.4 % (ref 0.0–14.0)
NEUT#: 3.2 10*3/uL (ref 1.5–6.5)
NEUT%: 58 % (ref 38.4–76.8)
Platelets: 255 10*3/uL (ref 145–400)
RBC: 4.09 10*6/uL (ref 3.70–5.45)
RDW: 13.5 % (ref 11.2–14.5)
WBC: 5.5 10*3/uL (ref 3.9–10.3)
lymph#: 1.6 10*3/uL (ref 0.9–3.3)

## 2015-06-27 NOTE — Telephone Encounter (Signed)
Appointments made and avs printed for patient °

## 2015-06-27 NOTE — Progress Notes (Signed)
Patient Care Team: [name], MD as PCP - General (Internal Medicine)  DIAGNOSIS: No matching staging information was found for the patient.  PRIOR THERAPY: 1. Status post left breast needle core biopsy on 06/05/2006 which showed ductal carcinoma in situ with apocrine features and associated microcalcifications, estrogen receptor 30% positive, progesterone receptor 13% positive.  2. Status post left breast wire/needle localized lumpectomy with left axillary sentinel node biopsy on 06/19/2006 for a stage I, pT1a, pN0(i)(sn), pMX, 0.3 cm invasive ductal carcinoma, grade 1, with extensive high-grade ductal carcinoma in situ cribriform and papillary types with necrosis and calcifications, margins negative for invasive carcinoma and DCIS, 2 partially calcified fibroadenomas, fibrocystic changes associated with microcalcifications, estrogen receptor 38 % positive, progesterone receptor 93 % positive, Ki-67 10%, HER-2/neu 2+, no additional tissue available for FISH testing, with 0/1 metastatic left axillary lymph nodes.  4. Radiation therapy from 07/30/2006 through 09/17/2006.  5. Antiestrogen therapy with Arimidex started in 09/2006 and 5 years of antiestrogen therapy was completed in 09/2011.   CURRENT THERAPY: Observation CHIEF COMPLIANT: followup of breast cancer INTERVAL HISTORY: Monique Figueroa is a  81 year old with above-mentioned history of left breast cancer diagnosed in 2007 treated with lumpectomy followed by radiation and took 5 years of antiestrogen therapy. She is here for routine follow-up. She reports a left breast ages when she does a lot of physical activity. Other than that she is without any problems or concerns.  REVIEW OF SYSTEMS:   Constitutional: Denies fevers, chills or abnormal weight loss Eyes: Denies blurriness of vision Ears, nose, mouth, throat, and face: Denies mucositis or sore throat Respiratory: Denies cough, dyspnea or wheezes Cardiovascular: Denies  palpitation, chest discomfort or lower extremity swelling Gastrointestinal:  Denies nausea, heartburn or change in bowel habits Skin: Denies abnormal skin rashes Lymphatics: Denies new lymphadenopathy or easy bruising Neurological:Denies numbness, tingling or new weaknesses Behavioral/Psych: Mood is stable, no new changes  Breast:  Intermittent left breast itchiness All other systems were reviewed with the patient and are negative.  I have reviewed the past medical history, past surgical history, social history and family history with the patient and they are unchanged from previous note.  ALLERGIES:  is allergic to sulfa antibiotics.  MEDICATIONS:  Current Outpatient Prescriptions  Medication Sig Dispense Refill  . acetaminophen (TYLENOL) 500 MG tablet Take 1,000 mg by mouth every 6 (six) hours as needed for mild pain, moderate pain or headache.    . hydrochlorothiazide (HYDRODIURIL) 25 MG tablet Take 25 mg by mouth daily.     Marland Kitchen ibuprofen (ADVIL,MOTRIN) 200 MG tablet Take 200 mg by mouth every 6 (six) hours as needed for headache, mild pain or moderate pain.    Marland Kitchen levothyroxine (SYNTHROID, LEVOTHROID) 125 MCG tablet Take 125 mcg by mouth daily before breakfast.     . metFORMIN (GLUCOPHAGE) 500 MG tablet Take 500 mg by mouth daily with breakfast.     . Multiple Vitamin (MULTIVITAMINS PO) Take 1 tablet by mouth daily.    Marland Kitchen omeprazole (PRILOSEC) 20 MG capsule Take 20 mg by mouth daily.     . ondansetron (ZOFRAN ODT) 4 MG disintegrating tablet 81m ODT q4 hours prn nausea/vomit 10 tablet 0  . vitamin C (ASCORBIC ACID) 500 MG tablet Take 500 mg by mouth daily.    .Marland KitchenVITAMIN D, CHOLECALCIFEROL, PO Take 1 tablet by mouth daily.    . VOLTAREN 1 % GEL Apply 2 g topically daily as needed (arthritis pain).   2   No current facility-administered  medications for this visit.    PHYSICAL EXAMINATION: ECOG PERFORMANCE STATUS: 1 - Symptomatic but completely ambulatory  Filed Vitals:   06/27/15 1328    BP: 147/76  Pulse: 86  Temp: 98.2 F (36.8 C)  Resp: 18   Filed Weights   06/27/15 1328  Weight: 176 lb 1.6 oz (79.878 kg)    GENERAL:alert, no distress and comfortable SKIN: skin color, texture, turgor are normal, no rashes or significant lesions EYES: normal, Conjunctiva are pink and non-injected, sclera clear OROPHARYNX:no exudate, no erythema and lips, buccal mucosa, and tongue normal  NECK: supple, thyroid normal size, non-tender, without nodularity LYMPH:  no palpable lymphadenopathy in the cervical, axillary or inguinal LUNGS: clear to auscultation and percussion with normal breathing effort HEART: regular rate & rhythm and no murmurs and no lower extremity edema ABDOMEN:abdomen soft, non-tender and normal bowel sounds Musculoskeletal:no cyanosis of digits and no clubbing  NEURO: alert & oriented x 3 with fluent speech, no focal motor/sensory deficits BREAST: no palpable nodularity in the right breast and axilla, left breast scar tissue has been palpated and slightly nodular from the effects of prior surgery and radiation.. (exam performed in the presence of a chaperone)  LABORATORY DATA:  I have reviewed the data as listed   Chemistry      Component Value Date/Time   NA 140 06/23/2014 1422   NA 140 06/06/2011 1338   K 4.2 06/23/2014 1422   K 3.2* 06/06/2011 1338   CL 104 06/09/2012 1402   CL 102 06/06/2011 1338   CO2 29 06/23/2014 1422   CO2 25 06/06/2011 1338   BUN 15.3 06/23/2014 1422   BUN 13 01/25/2014 1155   CREATININE 0.9 06/23/2014 1422   CREATININE 0.74 01/25/2014 1155   CREATININE 0.82 06/06/2011 1338      Component Value Date/Time   CALCIUM 9.3 06/23/2014 1422   CALCIUM 9.2 06/06/2011 1338   ALKPHOS 47 06/23/2014 1422   ALKPHOS 49 06/06/2011 1338   AST 18 06/23/2014 1422   AST 21 06/06/2011 1338   ALT 12 06/23/2014 1422   ALT 13 06/06/2011 1338   BILITOT 0.30 06/23/2014 1422   BILITOT 0.4 06/06/2011 1338       Lab Results  Component Value  Date   WBC 5.5 06/27/2015   HGB 12.9 06/27/2015   HCT 38.7 06/27/2015   MCV 94.6 06/27/2015   PLT 255 06/27/2015   NEUTROABS 3.2 06/27/2015   ASSESSMENT & PLAN:  History of breast cancer Left breast cancer invasive ductal carcinoma with DCIS T1 A. N0 M0 stage IA status post lumpectomy 06/19/2006 grade 1 ER 38%, PR 90%, Ki-67 10%, HER-2 plus by IHC, one lymph node negative, status post radiation therapy and Arimidex from 2008 to 2013 currently on observation  Surveillance: breast exam  Showed scar tissue in the left breast 11 to 12:00 position. Mammograms done in 05/2015 were reviewed and they were normal.  Survivorship: Encouraged her to stay active. 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 She also helps take care of her husband where the couple of cancers including a testicular lymphoma.  Return to clinic in one year for follow up with survivorship clinic   No orders of the defined types were placed in this encounter.   The patient has a good understanding of the overall plan. she agrees with it. she will call with any problems that may develop before the next visit here.   Rulon Eisenmenger, MD 06/27/2015

## 2015-08-01 DIAGNOSIS — Z961 Presence of intraocular lens: Secondary | ICD-10-CM | POA: Diagnosis not present

## 2015-08-01 DIAGNOSIS — E103291 Type 1 diabetes mellitus with mild nonproliferative diabetic retinopathy without macular edema, right eye: Secondary | ICD-10-CM | POA: Diagnosis not present

## 2015-08-01 DIAGNOSIS — E113292 Type 2 diabetes mellitus with mild nonproliferative diabetic retinopathy without macular edema, left eye: Secondary | ICD-10-CM | POA: Diagnosis not present

## 2015-12-25 DIAGNOSIS — E039 Hypothyroidism, unspecified: Secondary | ICD-10-CM | POA: Diagnosis not present

## 2015-12-25 DIAGNOSIS — R739 Hyperglycemia, unspecified: Secondary | ICD-10-CM | POA: Diagnosis not present

## 2015-12-25 DIAGNOSIS — I10 Essential (primary) hypertension: Secondary | ICD-10-CM | POA: Diagnosis not present

## 2015-12-28 DIAGNOSIS — I10 Essential (primary) hypertension: Secondary | ICD-10-CM | POA: Diagnosis not present

## 2015-12-28 DIAGNOSIS — E119 Type 2 diabetes mellitus without complications: Secondary | ICD-10-CM | POA: Diagnosis not present

## 2015-12-28 DIAGNOSIS — E7800 Pure hypercholesterolemia, unspecified: Secondary | ICD-10-CM | POA: Diagnosis not present

## 2015-12-28 DIAGNOSIS — E039 Hypothyroidism, unspecified: Secondary | ICD-10-CM | POA: Diagnosis not present

## 2016-02-15 DIAGNOSIS — H353131 Nonexudative age-related macular degeneration, bilateral, early dry stage: Secondary | ICD-10-CM | POA: Diagnosis not present

## 2016-02-15 DIAGNOSIS — E119 Type 2 diabetes mellitus without complications: Secondary | ICD-10-CM | POA: Diagnosis not present

## 2016-03-22 DIAGNOSIS — E7800 Pure hypercholesterolemia, unspecified: Secondary | ICD-10-CM | POA: Diagnosis not present

## 2016-03-22 DIAGNOSIS — Z780 Asymptomatic menopausal state: Secondary | ICD-10-CM | POA: Diagnosis not present

## 2016-03-22 DIAGNOSIS — E039 Hypothyroidism, unspecified: Secondary | ICD-10-CM | POA: Diagnosis not present

## 2016-03-22 DIAGNOSIS — I10 Essential (primary) hypertension: Secondary | ICD-10-CM | POA: Diagnosis not present

## 2016-03-22 DIAGNOSIS — E119 Type 2 diabetes mellitus without complications: Secondary | ICD-10-CM | POA: Diagnosis not present

## 2016-03-29 ENCOUNTER — Other Ambulatory Visit: Payer: Self-pay | Admitting: Internal Medicine

## 2016-03-29 DIAGNOSIS — E039 Hypothyroidism, unspecified: Secondary | ICD-10-CM | POA: Diagnosis not present

## 2016-03-29 DIAGNOSIS — R945 Abnormal results of liver function studies: Secondary | ICD-10-CM

## 2016-03-29 DIAGNOSIS — R7989 Other specified abnormal findings of blood chemistry: Secondary | ICD-10-CM

## 2016-03-29 DIAGNOSIS — E119 Type 2 diabetes mellitus without complications: Secondary | ICD-10-CM | POA: Diagnosis not present

## 2016-03-29 DIAGNOSIS — I10 Essential (primary) hypertension: Secondary | ICD-10-CM | POA: Diagnosis not present

## 2016-04-10 ENCOUNTER — Ambulatory Visit
Admission: RE | Admit: 2016-04-10 | Discharge: 2016-04-10 | Disposition: A | Discharge status: Home / Self Care | Payer: Medicare Other | Source: Ambulatory Visit | Attending: Internal Medicine | Admitting: Internal Medicine

## 2016-04-10 DIAGNOSIS — R7989 Other specified abnormal findings of blood chemistry: Secondary | ICD-10-CM

## 2016-04-10 DIAGNOSIS — R945 Abnormal results of liver function studies: Secondary | ICD-10-CM

## 2016-04-19 DIAGNOSIS — R7989 Other specified abnormal findings of blood chemistry: Secondary | ICD-10-CM | POA: Diagnosis not present

## 2016-04-19 DIAGNOSIS — R5383 Other fatigue: Secondary | ICD-10-CM | POA: Diagnosis not present

## 2016-04-26 ENCOUNTER — Other Ambulatory Visit: Payer: Self-pay | Admitting: Internal Medicine

## 2016-04-26 ENCOUNTER — Ambulatory Visit
Admission: RE | Admit: 2016-04-26 | Discharge: 2016-04-26 | Disposition: A | Discharge status: Home / Self Care | Payer: Medicare Other | Source: Ambulatory Visit | Attending: Internal Medicine | Admitting: Internal Medicine

## 2016-04-26 DIAGNOSIS — C50919 Malignant neoplasm of unspecified site of unspecified female breast: Secondary | ICD-10-CM

## 2016-04-26 DIAGNOSIS — K7689 Other specified diseases of liver: Secondary | ICD-10-CM | POA: Diagnosis not present

## 2016-04-26 DIAGNOSIS — N281 Cyst of kidney, acquired: Secondary | ICD-10-CM | POA: Diagnosis not present

## 2016-04-26 DIAGNOSIS — R7989 Other specified abnormal findings of blood chemistry: Secondary | ICD-10-CM | POA: Diagnosis not present

## 2016-04-26 DIAGNOSIS — R160 Hepatomegaly, not elsewhere classified: Secondary | ICD-10-CM

## 2016-04-26 HISTORY — DX: Malignant neoplasm of unspecified site of unspecified female breast: C50.919

## 2016-04-26 MED ORDER — GADOBENATE DIMEGLUMINE 529 MG/ML IV SOLN
15.0000 mL | Freq: Once | INTRAVENOUS | Status: AC | PRN
  Administered 2016-04-26: 15 mL via INTRAVENOUS

## 2016-04-29 ENCOUNTER — Other Ambulatory Visit (HOSPITAL_COMMUNITY): Payer: Self-pay | Admitting: Internal Medicine

## 2016-04-29 ENCOUNTER — Other Ambulatory Visit: Payer: Self-pay | Admitting: Hematology and Oncology

## 2016-04-29 DIAGNOSIS — Z1231 Encounter for screening mammogram for malignant neoplasm of breast: Secondary | ICD-10-CM

## 2016-04-29 DIAGNOSIS — K7689 Other specified diseases of liver: Secondary | ICD-10-CM | POA: Diagnosis not present

## 2016-04-29 DIAGNOSIS — R7989 Other specified abnormal findings of blood chemistry: Secondary | ICD-10-CM | POA: Diagnosis not present

## 2016-04-29 DIAGNOSIS — R160 Hepatomegaly, not elsewhere classified: Secondary | ICD-10-CM

## 2016-05-03 ENCOUNTER — Telehealth: Payer: Self-pay | Admitting: Hematology and Oncology

## 2016-05-03 NOTE — Telephone Encounter (Signed)
Spoke with pt to confirm 10/4 appt at 1130 am per LOS

## 2016-05-07 ENCOUNTER — Ambulatory Visit (HOSPITAL_COMMUNITY)
Admission: RE | Admit: 2016-05-07 | Discharge: 2016-05-07 | Disposition: A | Discharge status: Home / Self Care | Payer: Medicare Other | Source: Ambulatory Visit | Attending: Internal Medicine | Admitting: Internal Medicine

## 2016-05-07 DIAGNOSIS — C787 Secondary malignant neoplasm of liver and intrahepatic bile duct: Secondary | ICD-10-CM | POA: Diagnosis not present

## 2016-05-07 DIAGNOSIS — R160 Hepatomegaly, not elsewhere classified: Secondary | ICD-10-CM | POA: Diagnosis not present

## 2016-05-07 DIAGNOSIS — I700 Atherosclerosis of aorta: Secondary | ICD-10-CM | POA: Insufficient documentation

## 2016-05-07 DIAGNOSIS — I2510 Atherosclerotic heart disease of native coronary artery without angina pectoris: Secondary | ICD-10-CM | POA: Diagnosis not present

## 2016-05-07 DIAGNOSIS — D490 Neoplasm of unspecified behavior of digestive system: Secondary | ICD-10-CM | POA: Insufficient documentation

## 2016-05-07 DIAGNOSIS — K7689 Other specified diseases of liver: Secondary | ICD-10-CM | POA: Diagnosis present

## 2016-05-07 LAB — GLUCOSE, CAPILLARY: GLUCOSE-CAPILLARY: 93 mg/dL (ref 65–99)

## 2016-05-07 MED ORDER — FLUDEOXYGLUCOSE F - 18 (FDG) INJECTION
8.3700 | Freq: Once | INTRAVENOUS | Status: AC | PRN
  Administered 2016-05-07: 8.37 via INTRAVENOUS

## 2016-05-07 NOTE — Assessment & Plan Note (Signed)
Metastatic breast cancer with innumerable liver metastases detected on ultrasound and recent MRI of the liver on 04/26/2016 (Prior history of left breast IDC T1 1 N0 stage IA 0.3 cm grade 1 tumor that was ER 38%, PR 93%, Ki-67 10%, HER-2 2+, no tissue for FISH testing, 0/1 lymph node negative, status post lumpectomy radiation and 5 years of Arimidex completed in February 2013)  Radiology review: I discussed the MRI of the liver with the patient in great detail. We reviewed the images as well.  Recommendation: 1. Obtain biopsy of the liver to do breast prognostic panel testing especially HER-2/neu. 2. in the meantime start antiestrogen therapy with anastrozole 1 mg daily. 3. Return to clinic after the biopsy to discuss the pathology report and to finalize a treatment plan based upon the breast prognostic panel.  Goals of treatment: Palliation of symptoms and prolongation of life. Patient understands that stage IV breast cancer cannot be cured. Prognosis and survival would depend greatly on the breast prognostic panel, her performance status as well as response to therapy.

## 2016-05-08 ENCOUNTER — Encounter: Payer: Self-pay | Admitting: Hematology and Oncology

## 2016-05-08 ENCOUNTER — Ambulatory Visit (HOSPITAL_BASED_OUTPATIENT_CLINIC_OR_DEPARTMENT_OTHER): Payer: Medicare Other | Admitting: Hematology and Oncology

## 2016-05-08 ENCOUNTER — Ambulatory Visit (HOSPITAL_BASED_OUTPATIENT_CLINIC_OR_DEPARTMENT_OTHER): Payer: Medicare Other

## 2016-05-08 DIAGNOSIS — C787 Secondary malignant neoplasm of liver and intrahepatic bile duct: Secondary | ICD-10-CM

## 2016-05-08 DIAGNOSIS — C50919 Malignant neoplasm of unspecified site of unspecified female breast: Secondary | ICD-10-CM

## 2016-05-08 LAB — CBC WITH DIFFERENTIAL/PLATELET
BASO%: 0.8 % (ref 0.0–2.0)
Basophils Absolute: 0 10*3/uL (ref 0.0–0.1)
EOS ABS: 0 10*3/uL (ref 0.0–0.5)
EOS%: 0.7 % (ref 0.0–7.0)
HCT: 41.7 % (ref 34.8–46.6)
HEMOGLOBIN: 13.7 g/dL (ref 11.6–15.9)
LYMPH%: 18.9 % (ref 14.0–49.7)
MCH: 31.2 pg (ref 25.1–34.0)
MCHC: 32.8 g/dL (ref 31.5–36.0)
MCV: 95.2 fL (ref 79.5–101.0)
MONO#: 0.8 10*3/uL (ref 0.1–0.9)
MONO%: 15 % — ABNORMAL HIGH (ref 0.0–14.0)
NEUT%: 64.6 % (ref 38.4–76.8)
NEUTROS ABS: 3.3 10*3/uL (ref 1.5–6.5)
PLATELETS: 222 10*3/uL (ref 145–400)
RBC: 4.38 10*6/uL (ref 3.70–5.45)
RDW: 13.9 % (ref 11.2–14.5)
WBC: 5.1 10*3/uL (ref 3.9–10.3)
lymph#: 1 10*3/uL (ref 0.9–3.3)

## 2016-05-08 LAB — COMPREHENSIVE METABOLIC PANEL
ALBUMIN: 3.4 g/dL — AB (ref 3.5–5.0)
ALK PHOS: 140 U/L (ref 40–150)
ALT: 63 U/L — AB (ref 0–55)
ANION GAP: 10 meq/L (ref 3–11)
AST: 180 U/L (ref 5–34)
BILIRUBIN TOTAL: 0.7 mg/dL (ref 0.20–1.20)
BUN: 15 mg/dL (ref 7.0–26.0)
CO2: 25 mEq/L (ref 22–29)
CREATININE: 0.8 mg/dL (ref 0.6–1.1)
Calcium: 9.4 mg/dL (ref 8.4–10.4)
Chloride: 102 mEq/L (ref 98–109)
EGFR: 66 mL/min/{1.73_m2} — AB (ref >90)
GLUCOSE: 131 mg/dL (ref 70–140)
Potassium: 3.7 mEq/L (ref 3.5–5.1)
SODIUM: 137 meq/L (ref 136–145)
TOTAL PROTEIN: 7.2 g/dL (ref 6.4–8.3)

## 2016-05-08 LAB — LACTATE DEHYDROGENASE: LDH: 408 U/L — ABNORMAL HIGH (ref 125–245)

## 2016-05-08 LAB — CEA (IN HOUSE-CHCC): CEA (CHCC-In House): 610.25 ng/mL — ABNORMAL HIGH (ref 0.00–5.00)

## 2016-05-08 MED ORDER — LETROZOLE 2.5 MG PO TABS: 2.5000 mg | ORAL_TABLET | Freq: Every day | ORAL | 3 refills | Status: DC

## 2016-05-08 NOTE — Progress Notes (Signed)
Patient Care Team: [name], MD as PCP - General (Internal Medicine)  SUMMARY OF ONCOLOGIC HISTORY:   Metastatic breast cancer ([place])   06/05/2006 Initial Diagnosis    Left breast biopsy: DCIS with apocrine features ER 30%, PR 13%      06/19/2006 Surgery    Left lumpectomy: stage I, pT1a, pN0(i)(sn), pMX, 0.3 cm IDC, grade 1, with extensive high-grade DCIS , margins negative ER 38 %, PR 93 %, Ki-67 10%, HER-2/neu 2+, no additional tissue available for FISH testing, with 0/1 left axillary lymph nodes.      07/30/2006 - 09/17/2006 Radiation Therapy    Adjuvant radiation therapy      09/24/2006 - 09/25/2011 Anti-estrogen oral therapy    Antiestrogen therapy with Arimidex 5 years      04/26/2016 Relapse/Recurrence    Innumerable hepatic lesions seen in both lobes most are 1-2 cm size somewhat confluent largest in the posterior right hepatic dome 3 x 4.6 cm       CHIEF COMPLIANT: Newly diagnosed metastatic disease to the liver  INTERVAL HISTORY: Monique Figueroa is a 82 year old with history of left breast cancer diagnosed in 2007 treated with lumpectomy radiation and adjuvant antiestrogen therapy for 5 years for a stage IA disease. She was ER/PR positive and HER-2 was 2+ by IHC but no tissue is available for FISH testing. She had been doing quite well up until recently. She was noted to have elevated liver function tests and her primary care physician ordered scans that revealed extensive liver metastatic disease. She subsequently underwent a liver MRI and a PET/CT scan which was done yesterday. Patient is here today accompanied by her sons and her family to discuss the scans and the treatment plan. It is extremely sad to hear that her husband has recently died from metastatic disease to the brain. She and her family have not recovered from the trauma of losing a loved one.  REVIEW OF SYSTEMS:   Constitutional: Denies fevers, chills or abnormal weight loss Eyes: Denies blurriness of  vision Ears, nose, mouth, throat, and face: Denies mucositis or sore throat Respiratory: Denies cough, dyspnea or wheezes Cardiovascular: Denies palpitation, chest discomfort Gastrointestinal:  Denies nausea, heartburn or change in bowel habits mild right upper quadrant discomfort Skin: Denies abnormal skin rashes Lymphatics: Denies new lymphadenopathy or easy bruising Neurological:Denies numbness, tingling or new weaknesses Behavioral/Psych: Mood is stable, no new changes  Extremities: No lower extremity edema  All other systems were reviewed with the patient and are negative.  I have reviewed the past medical history, past surgical history, social history and family history with the patient and they are unchanged from previous note.  ALLERGIES:  is allergic to sulfa antibiotics.  MEDICATIONS:  Current Outpatient Prescriptions  Medication Sig Dispense Refill  . acetaminophen (TYLENOL) 500 MG tablet Take 1,000 mg by mouth every 6 (six) hours as needed for mild pain, moderate pain or headache.    . hydrochlorothiazide (HYDRODIURIL) 25 MG tablet Take 25 mg by mouth daily.     Marland Kitchen ibuprofen (ADVIL,MOTRIN) 200 MG tablet Take 200 mg by mouth every 6 (six) hours as needed for headache, mild pain or moderate pain.    Marland Kitchen levothyroxine (SYNTHROID, LEVOTHROID) 125 MCG tablet Take 125 mcg by mouth daily before breakfast.     . metFORMIN (GLUCOPHAGE) 500 MG tablet Take 500 mg by mouth daily with breakfast.     . Multiple Vitamin (MULTIVITAMINS PO) Take 1 tablet by mouth daily.    Marland Kitchen omeprazole (PRILOSEC)  20 MG capsule Take 20 mg by mouth daily.     . ondansetron (ZOFRAN ODT) 4 MG disintegrating tablet '4mg'$  ODT q4 hours prn nausea/vomit 10 tablet 0  . vitamin C (ASCORBIC ACID) 500 MG tablet Take 500 mg by mouth daily.    Marland Kitchen VITAMIN D, CHOLECALCIFEROL, PO Take 1 tablet by mouth daily.    . VOLTAREN 1 % GEL Apply 2 g topically daily as needed (arthritis pain).   2   No current facility-administered  medications for this visit.     PHYSICAL EXAMINATION: ECOG PERFORMANCE STATUS: 1 - Symptomatic but completely ambulatory  Vitals:   05/08/16 1115  BP: (!) 144/76  Pulse: 93  Resp: 18  Temp: 97.5 F (36.4 C)   Filed Weights   05/08/16 1115  Weight: 169 lb 8 oz (76.9 kg)    GENERAL:alert, no distress and comfortable SKIN: skin color, texture, turgor are normal, no rashes or significant lesions EYES: normal, Conjunctiva are pink and non-injected, sclera clear OROPHARYNX:no exudate, no erythema and lips, buccal mucosa, and tongue normal  NECK: supple, thyroid normal size, non-tender, without nodularity LYMPH:  no palpable lymphadenopathy in the cervical, axillary or inguinal LUNGS: clear to auscultation and percussion with normal breathing effort HEART: regular rate & rhythm and no murmurs and no lower extremity edema ABDOMEN:Hepatomegaly MUSCULOSKELETAL:no cyanosis of digits and no clubbing  NEURO: alert & oriented x 3 with fluent speech, no focal motor/sensory deficits EXTREMITIES: No lower extremity edema  LABORATORY DATA:  I have reviewed the data as listed   Chemistry      Component Value Date/Time   NA 142 06/27/2015 1313   K 4.1 06/27/2015 1313   CL 104 06/09/2012 1402   CO2 27 06/27/2015 1313   BUN 17.8 06/27/2015 1313   CREATININE 1.1 06/27/2015 1313      Component Value Date/Time   CALCIUM 9.1 06/27/2015 1313   ALKPHOS 51 06/27/2015 1313   AST 20 06/27/2015 1313   ALT 12 06/27/2015 1313   BILITOT 0.37 06/27/2015 1313       Lab Results  Component Value Date   WBC 5.5 06/27/2015   HGB 12.9 06/27/2015   HCT 38.7 06/27/2015   MCV 94.6 06/27/2015   PLT 255 06/27/2015   NEUTROABS 3.2 06/27/2015     ASSESSMENT & PLAN:  Metastatic breast cancer ([place]) Metastatic breast cancer with innumerable liver metastases detected on ultrasound and recent MRI of the liver on 04/26/2016 (Prior history of left breast IDC T1 1 N0 stage IA 0.3 cm grade 1 tumor that was  ER 38%, PR 93%, Ki-67 10%, HER-2 2+, no tissue for FISH testing, 0/1 lymph node negative, status post lumpectomy radiation and 5 years of Arimidex completed in February 2013)  Radiology review: I discussed the MRI of the liver with the patient in great detail. We reviewed the images as well. PET/CT scan: 05/07/2016: Extensive metastatic involvement of both lobes of the liver, primary could be liver, phalangeal or metastatic disease, hypermetabolic thoracic lymph nodes left axillary, left supraclavicular and right CP angle lymph nodes, hypermetabolic upper abdominal lymph nodes  Recommendation: 1. Obtain biopsy of the liver to do breast prognostic panel testing especially HER-2/neu. 2. we will perform blood work to look for tumor markers for cholangiole, liver cancer as well as colon cancer markers.  2. in the meantime start antiestrogen therapy with Letrozole 1 mg daily. 3. Return to clinic after the biopsy to discuss the pathology report and to finalize a treatment plan based upon  the breast prognostic panel.  Goals of treatment: Palliation of symptoms and prolongation of life. Patient understands that stage IV breast cancer cannot be cured. Prognosis and survival would depend greatly on the breast prognostic panel, her performance status as well as response to therapy.   No orders of the defined types were placed in this encounter.  The patient has a good understanding of the overall plan. she agrees with it. she will call with any problems that may develop before the next visit here.   Rulon Eisenmenger, MD 05/08/16

## 2016-05-09 LAB — AFP TUMOR MARKER: AFP, SERUM, TUMOR MARKER: 3 ng/mL (ref 0.0–8.3)

## 2016-05-09 LAB — CANCER ANTIGEN 19-9: CAN 19-9: 690 U/mL — AB (ref 0–35)

## 2016-05-09 LAB — CA 125: CANCER ANTIGEN (CA) 125: 82.7 U/mL — AB (ref 0.0–38.1)

## 2016-05-14 ENCOUNTER — Other Ambulatory Visit: Payer: Self-pay | Admitting: Radiology

## 2016-05-15 ENCOUNTER — Other Ambulatory Visit: Payer: Self-pay | Admitting: Radiology

## 2016-05-15 ENCOUNTER — Other Ambulatory Visit: Payer: Self-pay | Admitting: Physician Assistant

## 2016-05-16 ENCOUNTER — Ambulatory Visit (HOSPITAL_COMMUNITY)
Admission: RE | Admit: 2016-05-16 | Discharge: 2016-05-16 | Disposition: A | Discharge status: Home / Self Care | Payer: Medicare Other | Source: Ambulatory Visit | Attending: Hematology and Oncology | Admitting: Hematology and Oncology

## 2016-05-16 DIAGNOSIS — Z79899 Other long term (current) drug therapy: Secondary | ICD-10-CM | POA: Diagnosis not present

## 2016-05-16 DIAGNOSIS — C787 Secondary malignant neoplasm of liver and intrahepatic bile duct: Secondary | ICD-10-CM | POA: Diagnosis not present

## 2016-05-16 DIAGNOSIS — C801 Malignant (primary) neoplasm, unspecified: Secondary | ICD-10-CM | POA: Diagnosis not present

## 2016-05-16 DIAGNOSIS — K7689 Other specified diseases of liver: Secondary | ICD-10-CM | POA: Diagnosis not present

## 2016-05-16 DIAGNOSIS — Z853 Personal history of malignant neoplasm of breast: Secondary | ICD-10-CM | POA: Insufficient documentation

## 2016-05-16 DIAGNOSIS — Z7984 Long term (current) use of oral hypoglycemic drugs: Secondary | ICD-10-CM | POA: Insufficient documentation

## 2016-05-16 DIAGNOSIS — C50919 Malignant neoplasm of unspecified site of unspecified female breast: Secondary | ICD-10-CM

## 2016-05-16 LAB — COMPREHENSIVE METABOLIC PANEL
ALBUMIN: 3.5 g/dL (ref 3.5–5.0)
ALK PHOS: 117 U/L (ref 38–126)
ALT: 62 U/L — AB (ref 14–54)
AST: 211 U/L — ABNORMAL HIGH (ref 15–41)
Anion gap: 10 (ref 5–15)
BILIRUBIN TOTAL: 1.4 mg/dL — AB (ref 0.3–1.2)
BUN: 14 mg/dL (ref 6–20)
CALCIUM: 9.2 mg/dL (ref 8.9–10.3)
CO2: 23 mmol/L (ref 22–32)
CREATININE: 0.84 mg/dL (ref 0.44–1.00)
Chloride: 105 mmol/L (ref 101–111)
GFR calc Af Amer: >60 mL/min (ref >60)
GFR calc non Af Amer: >60 mL/min (ref >60)
GLUCOSE: 80 mg/dL (ref 65–99)
Potassium: 4.7 mmol/L (ref 3.5–5.1)
SODIUM: 138 mmol/L (ref 135–145)
TOTAL PROTEIN: 6.7 g/dL (ref 6.5–8.1)

## 2016-05-16 LAB — CBC WITH DIFFERENTIAL/PLATELET
BASOS PCT: 1 %
Basophils Absolute: 0 10*3/uL (ref 0.0–0.1)
EOS ABS: 0 10*3/uL (ref 0.0–0.7)
Eosinophils Relative: 0 %
HEMATOCRIT: 41.2 % (ref 36.0–46.0)
HEMOGLOBIN: 13.8 g/dL (ref 12.0–15.0)
LYMPHS ABS: 0.9 10*3/uL (ref 0.7–4.0)
Lymphocytes Relative: 17 %
MCH: 31.1 pg (ref 26.0–34.0)
MCHC: 33.5 g/dL (ref 30.0–36.0)
MCV: 92.8 fL (ref 78.0–100.0)
MONOS PCT: 13 %
Monocytes Absolute: 0.7 10*3/uL (ref 0.1–1.0)
NEUTROS ABS: 3.5 10*3/uL (ref 1.7–7.7)
NEUTROS PCT: 69 %
Platelets: 225 10*3/uL (ref 150–400)
RBC: 4.44 MIL/uL (ref 3.87–5.11)
RDW: 14.1 % (ref 11.5–15.5)
WBC: 5.1 10*3/uL (ref 4.0–10.5)

## 2016-05-16 LAB — GLUCOSE, CAPILLARY: Glucose-Capillary: 69 mg/dL (ref 65–99)

## 2016-05-16 LAB — PROTIME-INR
INR: 1.08
Prothrombin Time: 14 seconds (ref 11.4–15.2)

## 2016-05-16 MED ORDER — FENTANYL CITRATE (PF) 100 MCG/2ML IJ SOLN
INTRAMUSCULAR | Status: AC | PRN
  Administered 2016-05-16: 25 ug via INTRAVENOUS
  Administered 2016-05-16: 50 ug via INTRAVENOUS

## 2016-05-16 MED ORDER — MIDAZOLAM HCL 2 MG/2ML IJ SOLN
INTRAMUSCULAR | Status: AC | PRN
  Administered 2016-05-16: 1 mg via INTRAVENOUS
  Administered 2016-05-16: 0.5 mg via INTRAVENOUS

## 2016-05-16 MED ORDER — SODIUM CHLORIDE 0.9 % IV SOLN
INTRAVENOUS | Status: DC
  Administered 2016-05-16: 13:00:00 via INTRAVENOUS

## 2016-05-16 MED ORDER — FENTANYL CITRATE (PF) 100 MCG/2ML IJ SOLN
INTRAMUSCULAR | Status: AC
  Filled 2016-05-16: qty 2

## 2016-05-16 MED ORDER — MIDAZOLAM HCL 2 MG/2ML IJ SOLN
INTRAMUSCULAR | Status: AC
  Filled 2016-05-16: qty 2

## 2016-05-16 MED ORDER — LIDOCAINE HCL 1 % IJ SOLN
INTRAMUSCULAR | Status: AC
  Filled 2016-05-16: qty 20

## 2016-05-16 NOTE — Consult Note (Signed)
Chief Complaint: Patient was seen in consultation today for image guided liver lesion biopsy  Referring Physician(s): [place]  Supervising Physician: Arne Cleveland  Patient Status: outpatient  History of Present Illness: Monique Figueroa is a 82 y.o. female with history of left breast cancer diagnosed in 2007 treated with lumpectomy, radiation and adjuvant antiestrogen therapy for 5 years for a stage IA disease. She was ER/PR positive and HER-2 was 2+ by IHC but no tissue is available for FISH testing. She had been doing quite well up until recently. She was noted to have elevated liver function tests and her primary care physician ordered scans that revealed extensive liver metastatic disease. She subsequently underwent a liver MRI and a PET which was done 10/3 and revealed the following:  1. Extensive tumor involvement of both lobes of liver. Findings may reflect primary neoplasm of the liver such is HCC or cholangiocarcinoma. Metastatic disease not excluded.2. Hypermetabolic thoracic nodes involving left side of mediastinum,left axilla, left supraclavicular region, and right CP angle. Hypermetabolic lymph nodes are also noted within the upper abdomen 3. Aortic atherosclerosis and coronary artery calcification.4. Small amount of free fluid noted within the pelvis there cannot rule out malignant ascites.She presents today for image guided liver lesion biopsy for further evaluation.  Past Medical History:  Diagnosis Date  . Appendicitis   . Breast cancer ([place]) 06/2006   Invasive ductal and DCIS of left breast  . Breast tumor    History of bilateral breast tumors/cysts  . Cyst of spinal meninges   . GERD (gastroesophageal reflux disease)   . Hypertension   . Tuberculosis    medullary carcinoma    Past Surgical History:  Procedure Laterality Date  . APPENDECTOMY    . BREAST CYST EXCISION Bilateral    Several asprirations and excisions  . BREAST LUMPECTOMY WITH AXILLARY  LYMPH NODE BIOPSY Left 06/19/2006   Invasive ductal and in situ carcinoma, node negative  . SPINE SURGERY    . TOTAL THYROIDECTOMY      Allergies: Sulfa antibiotics  Medications: Prior to Admission medications   Medication Sig Start Date End Date Taking? Authorizing Provider  acetaminophen (TYLENOL) 500 MG tablet Take 1,000 mg by mouth every 6 (six) hours as needed for mild pain, moderate pain or headache.   Yes Historical Provider, MD  cholecalciferol (VITAMIN D) 1000 units tablet Take 1,000 Units by mouth daily.   Yes Historical Provider, MD  hydrochlorothiazide (HYDRODIURIL) 25 MG tablet Take 25 mg by mouth daily.  06/01/13  Yes Historical Provider, MD  ibuprofen (ADVIL,MOTRIN) 200 MG tablet Take 200 mg by mouth every 6 (six) hours as needed for headache, mild pain or moderate pain.   Yes Historical Provider, MD  letrozole (FEMARA) 2.5 MG tablet Take 1 tablet (2.5 mg total) by mouth daily. 05/08/16  Yes Nicholas Lose, MD  levothyroxine (SYNTHROID, LEVOTHROID) 125 MCG tablet Take 125 mcg by mouth daily before breakfast.  03/26/13  Yes Historical Provider, MD  loratadine (CLARITIN) 10 MG tablet Take 10 mg by mouth daily as needed for allergies.   Yes Historical Provider, MD  metFORMIN (GLUCOPHAGE) 500 MG tablet Take 500 mg by mouth daily with breakfast.  06/04/13  Yes Historical Provider, MD  Multiple Vitamins-Minerals (MULTIVITAMIN WITH MINERALS) tablet Take 1 tablet by mouth daily.   Yes Historical Provider, MD  ranitidine (ZANTAC) 150 MG tablet Take 150 mg by mouth at bedtime.   Yes Historical Provider, MD  vitamin B-12 (CYANOCOBALAMIN) 500 MCG tablet Take 500 mcg  by mouth daily as needed.   Yes Historical Provider, MD  vitamin C (ASCORBIC ACID) 500 MG tablet Take 500 mg by mouth daily.   Yes Historical Provider, MD     Family History  Problem Relation Age of Onset  . Rheum arthritis Mother   . Coronary artery disease Mother   . Dementia Mother   . Diabetes Mother   . Heart Problems  Father   . Cancer Sister     Breast cancer  . Cancer Brother     Lung cancer  . Diabetes Sister   . Dementia Sister     Social History   Social History  . Marital status: Widowed    Spouse name: N/A  . Number of children: N/A  . Years of education: N/A   Social History Main Topics  . Smoking status: Never Smoker  . Smokeless tobacco: Never Used  . Alcohol use No  . Drug use: No  . Sexual activity: Not Currently    Birth control/ protection: Post-menopausal   Other Topics Concern  . Not on file   Social History Narrative  . No narrative on file      Review of Systems she denies fever,HA, CP, dyspnea, vomiting or bleeding. She has had occ cough, abd/back pain, nausea, reflux, "gassy" discomfort and wt loss  Vital Signs: BP (!) 179/96   Pulse 93   Temp 97.9 F (36.6 C)   Resp 20   Ht _0  (1.651 m)   Wt 170 lb (77.1 kg)   SpO2 99%   BMI 28.29 kg/m   Physical Exam awake/alert; chest- CTA bilat; heart- RRR; abd- soft,+BS, mild RUQ/epigastric tenderness to palpation; LE- no edema  Mallampati Score:     Imaging: Mr Abdomen W Wo Contrast  Result Date: 04/26/2016 CLINICAL DATA:  Multiple liver lesions seen on recent ultrasound exam. Patient with a history of breast cancer in 2007. EXAM: MRI ABDOMEN WITHOUT AND WITH CONTRAST TECHNIQUE: Multiplanar multisequence MR imaging of the abdomen was performed both before and after the administration of intravenous contrast. CONTRAST:  27m MULTIHANCE GADOBENATE DIMEGLUMINE 529 MG/ML IV SOLN COMPARISON:  Ultrasound exam 04/10/2016 FINDINGS: Lower chest:  Unremarkable. Hepatobiliary: Innumerable hepatic lesions are seen in both lobes with the right hepatic predominance. Imaging features are consistent with metastatic disease. There is differential perfusion in the liver, potentially from mass-effect on the right portal vein by the bulky right hepatic disease. Most of the lesions are in the 1-2 cm size range although a  conglomeration of the lesions towards the dome of the liver becomes confluent than rather bulky. Lesion in the posterior right hepatic dome measures 3.0 x 4.6 cm. Gallbladder is nondistended and gallbladder wall appears mildly thickened. No intrahepatic or extrahepatic biliary dilation. Pancreas: No focal mass lesion. No dilatation of the main duct. No intraparenchymal cyst. No peripancreatic edema. Spleen: No splenomegaly. No focal mass lesion. Adrenals/Urinary Tract: No adrenal nodule or mass. Tiny cysts noted in the kidneys bilaterally. Stomach/Bowel: Stomach is nondistended. No gastric wall thickening. No evidence of outlet obstruction. Duodenum is normally positioned as is the ligament of Treitz. Small bowel loops and colon are unremarkable. Vascular/Lymphatic: No abdominal aortic aneurysm. Upper normal lymph nodes are seen in the hepatoduodenal ligament. No retroperitoneal lymphadenopathy. Other: No intraperitoneal free fluid. Musculoskeletal: No abnormal marrow signal within the visualized bony anatomy. IMPRESSION: 1. Innumerable lesions throughout the liver parenchyma, consistent with metastatic disease. 2. Bilateral renal cysts. Electronically Signed   By: EVerda CuminsD.  On: 04/26/2016 14:48   Nm Pet Image Initial (pi) Skull Base To Thigh  Result Date: 05/07/2016 CLINICAL DATA:  Initial Treatment strategy for metastatic disease. EXAM: NUCLEAR MEDICINE PET SKULL BASE TO THIGH TECHNIQUE: 8.4 mCi F-18 FDG was injected intravenously. Full-ring PET imaging was performed from the skull base to thigh after the radiotracer. CT data was obtained and used for attenuation correction and anatomic localization. FASTING BLOOD GLUCOSE:  Value: 93 mg/dl COMPARISON:  None FINDINGS: NECK 4 mm left supraclavicular lymph node is identified within SUV max equal to 4.14, image 31 of series 4. CHEST Hypermetabolic left axillary and retropectoral lymph nodes identified. Index left axillary node measures 8 mm and has an  SUV max equal to 9.1, image 51 of series 4. Heart size is normal. No pericardial effusion. Aortic atherosclerosis noted. Calcifications in the RCA and LAD coronary arteries noted. Hypermetabolic pre-vascular lymph nodes are identified. Index node measures 7 mm and has an SUV max equal to 4.5, image 48 of series 4. Hypermetabolic right CP angle node measures 4 mm and has an SUV max equal to 4.8, image 77 of series 4. No pleural effusion identified. No hypermetabolic pulmonary nodules. ABDOMEN/PELVIS Extensive hypermetabolic tumor involves the liver. Large confluent mass involving the entire segment 7 and portions of segment 8 measures 13.2 cm and has an SUV max equal to 16.7, image 81 of series 4. Index lesion within the lateral segment of left lobe of liver measures 1.3 cm and has an SUV max equal to 12.75. No abnormal uptake within the pancreas or spleen. The adrenal glands are normal. Unremarkable appearance of the kidneys the urinary bladder. Hypermetabolic upper abdominal adenopathy identified. Index aortocaval node measures 1.4 cm and has an SUV max equal to 11.7, image 90 of series 4. Small amount of free fluid noted within the dependent portion of the pelvis. SKELETON No focal hypermetabolic activity to suggest skeletal metastasis. IMPRESSION: 1. Extensive tumor involvement of both lobes of liver. Findings may reflect primary neoplasm of the liver such is HCC or cholangiocarcinoma. Metastatic disease not excluded. 2. Hypermetabolic thoracic nodes involving left side of mediastinum, left axilla, left supraclavicular region, and right CP angle. Hypermetabolic lymph nodes are also noted within the upper abdomen 3. Aortic atherosclerosis and coronary artery calcification. 4. Small amount of free fluid noted within the pelvis there cannot rule out malignant ascites. Electronically Signed   By: Kerby Moors M.D.   On: 05/07/2016 10:18    Labs:  CBC:  Recent Labs  06/27/15 1313 05/08/16 1210  WBC 5.5  5.1  HGB 12.9 13.7  HCT 38.7 41.7  PLT 255 222    COAGS: No results for input(s): INR, APTT in the last 8760 hours.  BMP:  Recent Labs  06/27/15 1313 05/08/16 1210  NA 142 137  K 4.1 3.7  CO2 27 25  GLUCOSE 96 131  BUN 17.8 15.0  CALCIUM 9.1 9.4  CREATININE 1.1 0.8    LIVER FUNCTION TESTS:  Recent Labs  06/27/15 1313 05/08/16 1210  BILITOT 0.37 0.70  AST 20 180*  ALT 12 63*  ALKPHOS 51 140  PROT 6.9 7.2  ALBUMIN 3.9 3.4*    TUMOR MARKERS: No results for input(s): AFPTM, CEA, CA199, CHROMGRNA in the last 8760 hours.  Assessment and Plan: Pt with hx left breast ca 2007 tx'd with lumpectomy, radiation, antiestrogen therapy; now with elevated LFT's and imaging revealing extensive liver involvement with tumor as well as hypermetabolic lymphadenopathy. She presents today for image guided liver  lesion biopsy with ER/PR, HER 2 NEU testing.Risks and benefits discussed with the patient/son including, but not limited to bleeding, infection, damage to adjacent structures or low yield requiring additional tests.All of the patient's questions were answered, patient is agreeable to proceed.Consent signed and in chart. LABS PENDING.      Thank you for this interesting consult.  I greatly enjoyed meeting Monique Figueroa and look forward to participating in their care.  A copy of this report was sent to the requesting provider on this date.  Electronically Signed: D. Rowe Robert 05/16/2016, 1:09 PM   I spent a total of 25 minutes in face to face in clinical consultation, greater than 50% of which was counseling/coordinating care for image guided liver lesion biopsy

## 2016-05-16 NOTE — Procedures (Signed)
US liver lesion core 18g x2 to surg path No complication No blood loss. See complete dictation in [facility].

## 2016-05-16 NOTE — Progress Notes (Signed)
Report given// care accepted

## 2016-05-16 NOTE — Discharge Instructions (Signed)

## 2016-05-22 ENCOUNTER — Telehealth: Payer: Self-pay | Admitting: *Deleted

## 2016-05-22 NOTE — Telephone Encounter (Signed)
Called to let pt know this information had been given to Dr. [name] for review and I would have more information for her regarding biopsy results and follow up appt once I speak with him.  Unable to reach pt, so left VM with this information.

## 2016-05-22 NOTE — Telephone Encounter (Signed)
Received call  Patient stated that she has been waiting on a return call back regarding her Biopsy that was done on 10/12. She has questions about that and upcoming appt to come back.

## 2016-05-28 ENCOUNTER — Ambulatory Visit (HOSPITAL_BASED_OUTPATIENT_CLINIC_OR_DEPARTMENT_OTHER): Payer: Medicare Other | Admitting: Hematology and Oncology

## 2016-05-28 ENCOUNTER — Other Ambulatory Visit: Payer: Self-pay

## 2016-05-28 ENCOUNTER — Encounter: Payer: Self-pay | Admitting: Hematology and Oncology

## 2016-05-28 ENCOUNTER — Other Ambulatory Visit: Payer: Self-pay | Admitting: General Surgery

## 2016-05-28 VITALS — BP 162/72 | HR 88 | Temp 97.6°F | Resp 18 | Ht 65.0 in | Wt 170.2 lb

## 2016-05-28 DIAGNOSIS — C787 Secondary malignant neoplasm of liver and intrahepatic bile duct: Secondary | ICD-10-CM | POA: Diagnosis not present

## 2016-05-28 DIAGNOSIS — C50919 Malignant neoplasm of unspecified site of unspecified female breast: Secondary | ICD-10-CM | POA: Diagnosis not present

## 2016-05-28 MED ORDER — PROCHLORPERAZINE MALEATE 10 MG PO TABS: 10.0000 mg | ORAL_TABLET | Freq: Four times a day (QID) | ORAL | 1 refills | Status: DC | PRN

## 2016-05-28 MED ORDER — ONDANSETRON HCL 8 MG PO TABS: 8.0000 mg | ORAL_TABLET | Freq: Two times a day (BID) | ORAL | 1 refills | Status: DC | PRN

## 2016-05-28 MED ORDER — LIDOCAINE-PRILOCAINE 2.5-2.5 % EX CREA: TOPICAL_CREAM | CUTANEOUS | 3 refills | Status: DC

## 2016-05-28 NOTE — Progress Notes (Signed)
Patient Care Team: [name], MD as PCP - General (Internal Medicine)  DIAGNOSIS:  Encounter Diagnoses  Name Primary?  . Metastases to the liver ([place]) Yes  . Metastatic breast cancer ([place])     SUMMARY OF ONCOLOGIC HISTORY:   Metastatic breast cancer ([place])   06/05/2006 Initial Diagnosis    Left breast biopsy: DCIS with apocrine features ER 30%, PR 13%      06/19/2006 Surgery    Left lumpectomy: stage I, pT1a, pN0(i)(sn), pMX, 0.3 cm IDC, grade 1, with extensive high-grade DCIS , margins negative ER 38 %, PR 93 %, Ki-67 10%, HER-2/neu 2+, no additional tissue available for FISH testing, with 0/1 left axillary lymph nodes.      07/30/2006 - 09/17/2006 Radiation Therapy    Adjuvant radiation therapy      09/24/2006 - 09/25/2011 Anti-estrogen oral therapy    Antiestrogen therapy with Arimidex 5 years      04/26/2016 Relapse/Recurrence    Innumerable hepatic lesions seen in both lobes most are 1-2 cm size somewhat confluent largest in the posterior right hepatic dome 3 x 4.6 cm      05/07/2016 PET scan    PET/CT scan: Extensive metastatic involvement of both lobes of the liver, primary could be liver, phalangeal or metastatic disease, hypermetabolic thoracic LN left axillary, left supraclavicular and right CP angle LN, hypermetabolic upper abdominal LN      05/16/2016 Initial Biopsy    Liver biopsy: Metastatic carcinoma breast primary strong positivity for CK 7 weak focal positivity for ER, GCDFP, negative for PR, CDX 2, TTF-1, WT 1; HER-2 positive       CHIEF COMPLIANT: follow-up to review the liver biopsy  INTERVAL HISTORY: Monique Figueroa is a 82 year old with above-mentioned history of liver lesions and biopsy-proven to be liver metastases from breast cancer. She is here accompanied by her family to discuss the results of the liver biopsy.  REVIEW OF SYSTEMS:   Constitutional: Denies fevers, chills or abnormal weight loss Eyes: Denies blurriness of vision Ears, nose,  mouth, throat, and face: Denies mucositis or sore throat Respiratory: Denies cough, dyspnea or wheezes Cardiovascular: Denies palpitation, chest discomfort Gastrointestinal:  Denies nausea, heartburn or change in bowel habits Skin: Denies abnormal skin rashes Lymphatics: Denies new lymphadenopathy or easy bruising Neurological:Denies numbness, tingling or new weaknesses Behavioral/Psych: Mood is stable, no new changes  Extremities: No lower extremity edema All other systems were reviewed with the patient and are negative.  I have reviewed the past medical history, past surgical history, social history and family history with the patient and they are unchanged from previous note.  ALLERGIES:  is allergic to sulfa antibiotics.  MEDICATIONS:  Current Outpatient Prescriptions  Medication Sig Dispense Refill  . acetaminophen (TYLENOL) 500 MG tablet Take 1,000 mg by mouth every 6 (six) hours as needed for mild pain, moderate pain or headache.    . cholecalciferol (VITAMIN D) 1000 units tablet Take 1,000 Units by mouth daily.    . hydrochlorothiazide (HYDRODIURIL) 25 MG tablet Take 25 mg by mouth daily.     Marland Kitchen ibuprofen (ADVIL,MOTRIN) 200 MG tablet Take 200 mg by mouth every 6 (six) hours as needed for headache, mild pain or moderate pain.    Marland Kitchen letrozole (FEMARA) 2.5 MG tablet Take 1 tablet (2.5 mg total) by mouth daily. 90 tablet 3  . levothyroxine (SYNTHROID, LEVOTHROID) 125 MCG tablet Take 125 mcg by mouth daily before breakfast.     . loratadine (CLARITIN) 10 MG tablet Take 10  mg by mouth daily as needed for allergies.    . metFORMIN (GLUCOPHAGE) 500 MG tablet Take 500 mg by mouth daily with breakfast.     . Multiple Vitamins-Minerals (MULTIVITAMIN WITH MINERALS) tablet Take 1 tablet by mouth daily.    . ranitidine (ZANTAC) 150 MG tablet Take 150 mg by mouth at bedtime.    . vitamin B-12 (CYANOCOBALAMIN) 500 MCG tablet Take 500 mcg by mouth daily as needed.    . vitamin C (ASCORBIC ACID) 500  MG tablet Take 500 mg by mouth daily.     No current facility-administered medications for this visit.     PHYSICAL EXAMINATION: ECOG PERFORMANCE STATUS: 1 - Symptomatic but completely ambulatory  Vitals:   05/28/16 1102  BP: (!) 162/72  Pulse: 88  Resp: 18  Temp: 97.6 F (36.4 C)   Filed Weights   05/28/16 1102  Weight: 170 lb 3.2 oz (77.2 kg)    GENERAL:alert, no distress and comfortable SKIN: skin color, texture, turgor are normal, no rashes or significant lesions EYES: normal, Conjunctiva are pink and non-injected, sclera clear OROPHARYNX:no exudate, no erythema and lips, buccal mucosa, and tongue normal  NECK: supple, thyroid normal size, non-tender, without nodularity LYMPH:  no palpable lymphadenopathy in the cervical, axillary or inguinal LUNGS: clear to auscultation and percussion with normal breathing effort HEART: regular rate & rhythm and no murmurs and no lower extremity edema ABDOMEN:abdomen soft, non-tender and normal bowel sounds MUSCULOSKELETAL:no cyanosis of digits and no clubbing  NEURO: alert & oriented x 3 with fluent speech, no focal motor/sensory deficits EXTREMITIES: No lower extremity edema  LABORATORY DATA:  I have reviewed the data as listed   Chemistry      Component Value Date/Time   NA 138 05/16/2016 1254   NA 137 05/08/2016 1210   K 4.7 05/16/2016 1254   K 3.7 05/08/2016 1210   CL 105 05/16/2016 1254   CL 104 06/09/2012 1402   CO2 23 05/16/2016 1254   CO2 25 05/08/2016 1210   BUN 14 05/16/2016 1254   BUN 15.0 05/08/2016 1210   CREATININE 0.84 05/16/2016 1254   CREATININE 0.8 05/08/2016 1210      Component Value Date/Time   CALCIUM 9.2 05/16/2016 1254   CALCIUM 9.4 05/08/2016 1210   ALKPHOS 117 05/16/2016 1254   ALKPHOS 140 05/08/2016 1210   AST 211 (H) 05/16/2016 1254   AST 180 (HH) 05/08/2016 1210   ALT 62 (H) 05/16/2016 1254   ALT 63 (H) 05/08/2016 1210   BILITOT 1.4 (H) 05/16/2016 1254   BILITOT 0.70 05/08/2016 1210        Lab Results  Component Value Date   WBC 5.1 05/16/2016   HGB 13.8 05/16/2016   HCT 41.2 05/16/2016   MCV 92.8 05/16/2016   PLT 225 05/16/2016   NEUTROABS 3.5 05/16/2016     ASSESSMENT & PLAN:  Metastatic breast cancer ([facility]) Metastatic breast cancer with innumerable liver metastases detected on ultrasound and recent MRI of the liver on 04/26/2016 (Prior history of left breast IDC T1 1 N0 stage IA 0.3 cm grade 1 tumor that was ER 38%, PR 93%, Ki-67 10%, HER-2 2+, no tissue for FISH testing, 0/1 lymph node negative, status post lumpectomy radiation and 5 years of Arimidex completed in February 2013)  PET/CT scan: 05/07/2016: Extensive metastatic involvement of both lobes of the liver, primary could be liver, phalangeal or metastatic disease, hypermetabolic thoracic lymph nodes left axillary, left supraclavicular and right CP angle lymph nodes, hypermetabolic upper abdominal  lymph nodes  Liver biopsy 05/16/2016: Metastatic carcinoma breast primary strong positivity for CK 7 weak focal positivity for ER, GCDFP, negative for PR, CDX 2, TTF-1, WT 1; HER-2 positive  Treatment plan: 1. Taxotere Herceptin and Perjeta every 3 weeks palliative chemotherapy followed by Herceptin and Perjeta maintenance after 6 cycles 2. We'll request a port placement and chemotherapy class and echocardiogram  Chemotherapy counseling: I discussed risks and benefits of Taxotere including the risk of neuropathy, cytopenias, fatigue, loss of taste, loss of hair. We also discussed Herceptin and Perjeta toxicities including diarrhea, and cardiac toxicities. Patient understands this risks and consented to seek treatment.  Goals of treatment: Palliation of symptoms and prolongation of life. Patient understands that stage IV breast cancer cannot be cured. Prognosis and survival would depend greatly on the breast prognostic panel, her performance status as well as response to therapy.  Return to clinic to start  chemotherapy.  No orders of the defined types were placed in this encounter.  The patient has a good understanding of the overall plan. she agrees with it. she will call with any problems that may develop before the next visit here.   Rulon Eisenmenger, MD 05/28/16

## 2016-05-28 NOTE — Progress Notes (Signed)
Order entered for ECHO as requested by Dr. [name] for evaluation prior to start of chemo.  Will contact scheduling department to have this scheduled soonest available.

## 2016-05-28 NOTE — Assessment & Plan Note (Signed)
Metastatic breast cancer with innumerable liver metastases detected on ultrasound and recent MRI of the liver on 04/26/2016 (Prior history of left breast IDC T1 1 N0 stage IA 0.3 cm grade 1 tumor that was ER 38%, PR 93%, Ki-67 10%, HER-2 2+, no tissue for FISH testing, 0/1 lymph node negative, status post lumpectomy radiation and 5 years of Arimidex completed in February 2013)  PET/CT scan: 05/07/2016: Extensive metastatic involvement of both lobes of the liver, primary could be liver, phalangeal or metastatic disease, hypermetabolic thoracic lymph nodes left axillary, left supraclavicular and right CP angle lymph nodes, hypermetabolic upper abdominal lymph nodes  Liver biopsy 05/16/2016: Metastatic carcinoma breast primary strong positivity for CK 7 weak focal positivity for ER, GCDFP, negative for PR, CDX 2, TTF-1, WT 1; HER-2 positive  Treatment plan: 1. Taxotere Herceptin and Perjeta every 3 weeks palliative chemotherapy followed by Herceptin and Perjeta maintenance after 6 cycles 2. We'll request a port placement and chemotherapy class and echocardiogram  Chemotherapy counseling: I discussed risks and benefits of Taxotere including the risk of neuropathy, cytopenias, fatigue, loss of taste, loss of hair. We also discussed Herceptin and Perjeta toxicities including diarrhea, and cardiac toxicities. Patient understands this risks and consented to seek treatment.  Goals of treatment: Palliation of symptoms and prolongation of life. Patient understands that stage IV breast cancer cannot be cured. Prognosis and survival would depend greatly on the breast prognostic panel, her performance status as well as response to therapy.  Return to clinic to start chemotherapy.

## 2016-05-28 NOTE — Progress Notes (Signed)
START ON PATHWAY REGIMEN - Breast  [id number]: Docetaxel + Trastuzumab + Pertuzumab (THP) q21 Days Until Progression or Toxicity   A cycle is every 21 days:     Pertuzumab (Perjeta(R)) 840 mg flat dose IV in 250 mL NS over 60 minutes as a loading dose cycle 1 only. See observation note below. Dose Mod: None     Pertuzumab (Perjeta(R)) 420 mg flat dose IV in 250 mL NS over 30 minutes as maintenance dose cycles 2 and beyond. An observation period of 30-60 minutes is recommended after each dose prior to subsequent infusion of trastuzumab or docetaxel. Dose Mod: None     Trastuzumab (Herceptin(R)) 8 mg/kg IV in 250 mL NS over 90 minutes as a loading dose cycle 1 only. Dose Mod: None     Trastuzumab (Herceptin(R)) 6 mg/kg IV in 250 mL NS over 30 minutes as maintenance dose cycles 2 and beyond. Dose Mod: None     Docetaxel (Taxotere(R)) 75 mg/m2 IV in 250 mL NS over 1 hour. Study considered docetaxel as optional beyond the 6th cycle. Dose Mod: None Additional Orders: Premedicate with dexamethasone 8 mg PO bid for three days beginning 1 day prior to docetaxel therapy. Recommended Monitoring: LVEF baseline and q3-4 months or with the development of cardiac symptoms and regimen changes for metastatic treatment.  **Always confirm dose/schedule in your pharmacy ordering system**    Patient Characteristics: Metastatic Chemotherapy, HER2/neu Positive, ER +, First Line AJCC Stage Grouping: IV Current Disease Status: Distant Metastases AJCC M Stage: 1 ER Status: Positive (+) AJCC N Stage: X AJCC T Stage: X HER2/neu: Positive (+) PR Status: Negative (-) Line of therapy: First Line Would you be surprised if this patient died  in the next year? I would be surprised if this patient died in the next year  Intent of Therapy: Non-Curative / Palliative Intent, Discussed with Patient

## 2016-05-29 ENCOUNTER — Encounter (HOSPITAL_BASED_OUTPATIENT_CLINIC_OR_DEPARTMENT_OTHER): Payer: Self-pay | Admitting: *Deleted

## 2016-05-29 ENCOUNTER — Telehealth: Payer: Self-pay | Admitting: *Deleted

## 2016-05-29 NOTE — Telephone Encounter (Signed)
Notified pt of Echo on 10/31 9am. Pt verbalized understanding and location of appt. No further concerns.

## 2016-05-31 ENCOUNTER — Ambulatory Visit: Payer: Medicare Other

## 2016-06-03 ENCOUNTER — Ambulatory Visit
Admission: RE | Admit: 2016-06-03 | Discharge: 2016-06-03 | Disposition: A | Discharge status: Home / Self Care | Payer: Medicare Other | Source: Ambulatory Visit | Attending: General Surgery | Admitting: General Surgery

## 2016-06-03 ENCOUNTER — Other Ambulatory Visit: Payer: Self-pay | Admitting: General Surgery

## 2016-06-03 ENCOUNTER — Encounter (HOSPITAL_BASED_OUTPATIENT_CLINIC_OR_DEPARTMENT_OTHER)
Admission: RE | Admit: 2016-06-03 | Discharge: 2016-06-03 | Disposition: A | Discharge status: Home / Self Care | Payer: Medicare Other | Source: Ambulatory Visit

## 2016-06-03 DIAGNOSIS — C787 Secondary malignant neoplasm of liver and intrahepatic bile duct: Secondary | ICD-10-CM | POA: Diagnosis not present

## 2016-06-03 DIAGNOSIS — I082 Rheumatic disorders of both aortic and tricuspid valves: Secondary | ICD-10-CM | POA: Diagnosis not present

## 2016-06-03 DIAGNOSIS — Z01811 Encounter for preprocedural respiratory examination: Secondary | ICD-10-CM

## 2016-06-03 DIAGNOSIS — Z7984 Long term (current) use of oral hypoglycemic drugs: Secondary | ICD-10-CM | POA: Diagnosis not present

## 2016-06-03 DIAGNOSIS — I10 Essential (primary) hypertension: Secondary | ICD-10-CM | POA: Diagnosis not present

## 2016-06-03 DIAGNOSIS — Z79899 Other long term (current) drug therapy: Secondary | ICD-10-CM | POA: Diagnosis not present

## 2016-06-03 DIAGNOSIS — Z853 Personal history of malignant neoplasm of breast: Secondary | ICD-10-CM | POA: Diagnosis not present

## 2016-06-03 DIAGNOSIS — C50919 Malignant neoplasm of unspecified site of unspecified female breast: Secondary | ICD-10-CM | POA: Diagnosis not present

## 2016-06-03 DIAGNOSIS — K219 Gastro-esophageal reflux disease without esophagitis: Secondary | ICD-10-CM | POA: Diagnosis not present

## 2016-06-03 DIAGNOSIS — Z85850 Personal history of malignant neoplasm of thyroid: Secondary | ICD-10-CM | POA: Diagnosis not present

## 2016-06-03 DIAGNOSIS — E890 Postprocedural hypothyroidism: Secondary | ICD-10-CM | POA: Diagnosis not present

## 2016-06-03 DIAGNOSIS — Z170 Estrogen receptor positive status [ER+]: Secondary | ICD-10-CM | POA: Diagnosis not present

## 2016-06-03 DIAGNOSIS — Z923 Personal history of irradiation: Secondary | ICD-10-CM | POA: Diagnosis not present

## 2016-06-03 DIAGNOSIS — E119 Type 2 diabetes mellitus without complications: Secondary | ICD-10-CM | POA: Diagnosis not present

## 2016-06-04 ENCOUNTER — Ambulatory Visit (HOSPITAL_COMMUNITY)
Admission: RE | Admit: 2016-06-04 | Discharge: 2016-06-04 | Disposition: A | Discharge status: Home / Self Care | Payer: Medicare Other | Source: Ambulatory Visit | Attending: Hematology and Oncology | Admitting: Hematology and Oncology

## 2016-06-04 DIAGNOSIS — I082 Rheumatic disorders of both aortic and tricuspid valves: Secondary | ICD-10-CM | POA: Diagnosis not present

## 2016-06-04 DIAGNOSIS — I10 Essential (primary) hypertension: Secondary | ICD-10-CM | POA: Diagnosis not present

## 2016-06-04 DIAGNOSIS — C787 Secondary malignant neoplasm of liver and intrahepatic bile duct: Secondary | ICD-10-CM | POA: Diagnosis not present

## 2016-06-04 DIAGNOSIS — C50919 Malignant neoplasm of unspecified site of unspecified female breast: Secondary | ICD-10-CM | POA: Diagnosis not present

## 2016-06-04 DIAGNOSIS — E119 Type 2 diabetes mellitus without complications: Secondary | ICD-10-CM | POA: Diagnosis not present

## 2016-06-04 NOTE — H&P (Signed)
Monique Figueroa  Location: [facility] Patient #: [id number] DOB: 31-Oct-1933 Married / Language: English / Race: White Female       History of Present Illness  The patient is a 82 year old female who presents with a complaint of stage IV breast cancer. This is a very pleasant 82 year old female, former patient of mine. Referred to me by Dr. [name] because of recent diagnosis of metastatic disease. Dr. [name] is her PCP  She was diagnosed with cancer of the left breast on June 05, 2006, receptor positive. On June 19, 2006 showed a left lumpectomy with finding of stage TIa, N0 cancer, 3 mm tumor, high-grade DCIS, margins negative, receptor positive. HER-2 positive. She received adjuvant radiation therapy and Arimidex for 5 years. She feels fine except she's had some upper abdominal discomfort and her reflux symptoms have gotten a little bit worse. Dr. Maudie Mercury found that her liver function tests were elevated and her ultrasound of her liver looked abnormal. She subsequently had CT scans and PET scan. She has multiple liver lesions. PET CT is positive in the left axilla, left supraclavicular, right costophrenic angle, and upper abdomen. Liver biopsy performed on May 15, 2016 is positive for metastatic adenocarcinoma, estrogen receptor weakly positive, PR negative. HER-2 positive. Dr. [name] has started on her own tomorrow and requested a Port-A-Cath so he can get Taxotere, Herceptin and projeta. Comorbidities include a thyroid cancer 30 years ago no recurrence. Hypertension. Hypothyroidism, surgically induced, non-insulin-dependent diabetes mellitus, GERD. Reflux symptoms are a little bit worse lately lately. She takes Zantac Family history reveals mother died of stroke and rheumatoid arthritis. Father died of some type of liver disease but not cancer Social history reveals that her husband died 46 weeks ago. He was a patient of mine for colon  cancer. He subsequently was diagnosed with testicular lymphoma and died of brain metastasis from the lymphoma. She now lives alone. 2 sons one lives in [place] and he is with her today. One lives in [place]. Denies tobacco.  She is scheduled for Port-A-Cath insertion on November 1, 2 days from now. I discussed the indications, details, techniques, and numerous risk of the surgery with her. I gave her patient information booklets and pictures. She is aware of the risk of bleeding, infection, bilateral attempts, air embolus, pneumothorax, subsequent revision if there's clotting or failure. She understands all these issues. All of her questions were answered. She agrees with this plan.       Allergies  Sulfa Drugs  Medication History  Hydrochlorothiazide ('25MG'$  Tablet, Oral) Active. Levothyroxine Sodium (125MCG Tablet, Oral) Active. MetFORMIN HCl ('500MG'$  Tablet, Oral) Active. Letrozole (2.'5MG'$  Tablet, Oral) Active. Loratadine ('10MG'$  Tablet, Oral) Active. Multi Vitamin Daily (Oral) Active. RaNITidine HCl ('150MG'$  Tablet, Oral) Active. Vitamin B12 (100MCG Tablet, 500 mg Oral) Active. Vitamin C ('500MG'$  Tablet, Oral) Active. Medications Reconciled  Vitals  06/03/2016 1:34 PM Height: 65in Pulse: 76 (Regular)  BP: 126/74 (Sitting, Left Arm, Standard)    Physical Exam General Mental Status-Alert. General Appearance-Not in acute distress. Build & Nutrition-Well nourished. Posture-Normal posture. Gait-Normal.  Head and Neck Head-normocephalic, atraumatic with no lesions or palpable masses. Trachea-midline. Thyroid Gland Characteristics - normal size and consistency and no palpable nodules. Note: Thyroidectomy scar   Chest and Lung Exam Chest and lung exam reveals -on auscultation, normal breath sounds, no adventitious sounds and normal vocal resonance. Note: Lungs are clear. Clavicles normal without deformity   Breast Note: Not  examined today   Cardiovascular Cardiovascular examination reveals -normal  heart sounds, regular rate and rhythm with no murmurs and femoral artery auscultation bilaterally reveals normal pulses, no bruits, no thrills.  Abdomen Note: Abdomen soft. Lower midline incision from remote appendectomy. Slightly tender in the upper abdomen think I can feel the liver edge.   Neurologic Neurologic evaluation reveals -alert and oriented x 3 with no impairment of recent or remote memory, normal attention span and ability to concentrate, normal sensation and normal coordination.  Musculoskeletal Normal Exam - Bilateral-Upper Extremity Strength Normal and Lower Extremity Strength Normal.    Assessment & Plan  BREAST CANCER METASTASIZED TO LIVER, UNSPECIFIED LATERALITY (C50.919) Current Plans Schedule for Surgery  You have been found to have breast cancer in your liver I agree with the Femara drug that you're taking I agree with starting chemotherapy as suggested by Dr. [name] I agree with Port-A-Cath insertion  you will be scheduled for Port-A-Cath insertion, which is scheduled for November 1 We have discussed the indications, techniques, and numerous risk of the surgery with you and your son. Please read the printed information that I have given you.  HISTORY OF LEFT BREAST CANCER (Z85.3) DIABETES TYPE 2, CONTROLLED (E11.9) HYPERTENSION, BENIGN (I10) HISTORY OF THYROID CANCER (Z85.850) Impression: Remote. Greater than 30 years ago. No recurrence. CHRONIC GERD (K21.9)    Sashia Campas M  7:22 AM

## 2016-06-04 NOTE — Progress Notes (Signed)
  Echocardiogram 2D Echocardiogram has been performed.  Tresa Res 06/04/2016, 9:51 AM

## 2016-06-05 ENCOUNTER — Ambulatory Visit (HOSPITAL_BASED_OUTPATIENT_CLINIC_OR_DEPARTMENT_OTHER): Payer: Medicare Other | Admitting: Certified Registered"

## 2016-06-05 ENCOUNTER — Ambulatory Visit (HOSPITAL_COMMUNITY): Payer: Medicare Other

## 2016-06-05 ENCOUNTER — Encounter (HOSPITAL_BASED_OUTPATIENT_CLINIC_OR_DEPARTMENT_OTHER): Payer: Self-pay | Admitting: *Deleted

## 2016-06-05 ENCOUNTER — Encounter (HOSPITAL_BASED_OUTPATIENT_CLINIC_OR_DEPARTMENT_OTHER)
Admission: RE | Disposition: A | Discharge status: Home / Self Care | Payer: Self-pay | Source: Ambulatory Visit | Attending: General Surgery

## 2016-06-05 ENCOUNTER — Ambulatory Visit (HOSPITAL_BASED_OUTPATIENT_CLINIC_OR_DEPARTMENT_OTHER)
Admission: RE | Admit: 2016-06-05 | Discharge: 2016-06-05 | Disposition: A | Discharge status: Home / Self Care | Payer: Medicare Other | Source: Ambulatory Visit | Attending: General Surgery | Admitting: General Surgery

## 2016-06-05 DIAGNOSIS — Z419 Encounter for procedure for purposes other than remedying health state, unspecified: Secondary | ICD-10-CM

## 2016-06-05 DIAGNOSIS — I10 Essential (primary) hypertension: Secondary | ICD-10-CM | POA: Diagnosis not present

## 2016-06-05 DIAGNOSIS — E119 Type 2 diabetes mellitus without complications: Secondary | ICD-10-CM | POA: Insufficient documentation

## 2016-06-05 DIAGNOSIS — Z452 Encounter for adjustment and management of vascular access device: Secondary | ICD-10-CM | POA: Diagnosis not present

## 2016-06-05 DIAGNOSIS — Z170 Estrogen receptor positive status [ER+]: Secondary | ICD-10-CM | POA: Insufficient documentation

## 2016-06-05 DIAGNOSIS — K219 Gastro-esophageal reflux disease without esophagitis: Secondary | ICD-10-CM | POA: Diagnosis not present

## 2016-06-05 DIAGNOSIS — Z7984 Long term (current) use of oral hypoglycemic drugs: Secondary | ICD-10-CM | POA: Diagnosis not present

## 2016-06-05 DIAGNOSIS — C787 Secondary malignant neoplasm of liver and intrahepatic bile duct: Secondary | ICD-10-CM | POA: Insufficient documentation

## 2016-06-05 DIAGNOSIS — C50919 Malignant neoplasm of unspecified site of unspecified female breast: Secondary | ICD-10-CM | POA: Diagnosis not present

## 2016-06-05 DIAGNOSIS — Z853 Personal history of malignant neoplasm of breast: Secondary | ICD-10-CM | POA: Diagnosis not present

## 2016-06-05 DIAGNOSIS — Z79899 Other long term (current) drug therapy: Secondary | ICD-10-CM | POA: Insufficient documentation

## 2016-06-05 DIAGNOSIS — Z923 Personal history of irradiation: Secondary | ICD-10-CM | POA: Diagnosis not present

## 2016-06-05 DIAGNOSIS — Z85850 Personal history of malignant neoplasm of thyroid: Secondary | ICD-10-CM | POA: Diagnosis not present

## 2016-06-05 DIAGNOSIS — Z01818 Encounter for other preprocedural examination: Secondary | ICD-10-CM

## 2016-06-05 DIAGNOSIS — E890 Postprocedural hypothyroidism: Secondary | ICD-10-CM | POA: Insufficient documentation

## 2016-06-05 HISTORY — DX: Type 2 diabetes mellitus without complications: E11.9

## 2016-06-05 HISTORY — PX: PORTACATH PLACEMENT: SHX2246

## 2016-06-05 HISTORY — DX: Hypothyroidism, unspecified: E03.9

## 2016-06-05 HISTORY — DX: Malignant neoplasm of unspecified site of unspecified female breast: C50.919

## 2016-06-05 LAB — GLUCOSE, CAPILLARY: GLUCOSE-CAPILLARY: 91 mg/dL (ref 65–99)

## 2016-06-05 SURGERY — INSERTION, TUNNELED CENTRAL VENOUS DEVICE, WITH PORT
Anesthesia: General | Site: Chest

## 2016-06-05 MED ORDER — LACTATED RINGERS IV SOLN
INTRAVENOUS | Status: DC
  Administered 2016-06-05: 09:00:00 via INTRAVENOUS

## 2016-06-05 MED ORDER — SCOPOLAMINE 1 MG/3DAYS TD PT72: 1.0000 | MEDICATED_PATCH | Freq: Once | TRANSDERMAL | Status: DC | PRN

## 2016-06-05 MED ORDER — CEFAZOLIN SODIUM-DEXTROSE 2-4 GM/100ML-% IV SOLN
INTRAVENOUS | Status: AC
  Filled 2016-06-05: qty 100

## 2016-06-05 MED ORDER — SODIUM BICARBONATE 4 % IV SOLN
INTRAVENOUS | Status: AC
  Filled 2016-06-05: qty 5

## 2016-06-05 MED ORDER — FENTANYL CITRATE (PF) 100 MCG/2ML IJ SOLN
INTRAMUSCULAR | Status: AC
  Filled 2016-06-05: qty 2

## 2016-06-05 MED ORDER — LIDOCAINE HCL (CARDIAC) 20 MG/ML IV SOLN
INTRAVENOUS | Status: DC | PRN
  Administered 2016-06-05: 60 mg via INTRAVENOUS

## 2016-06-05 MED ORDER — FENTANYL CITRATE (PF) 100 MCG/2ML IJ SOLN
50.0000 ug | INTRAMUSCULAR | Status: DC | PRN
  Administered 2016-06-05: 50 ug via INTRAVENOUS

## 2016-06-05 MED ORDER — CEFAZOLIN SODIUM-DEXTROSE 2-4 GM/100ML-% IV SOLN
2.0000 g | INTRAVENOUS | Status: AC
  Administered 2016-06-05: 2 g via INTRAVENOUS

## 2016-06-05 MED ORDER — BUPIVACAINE HCL (PF) 0.5 % IJ SOLN
INTRAMUSCULAR | Status: AC
  Filled 2016-06-05: qty 60

## 2016-06-05 MED ORDER — DEXAMETHASONE SODIUM PHOSPHATE 10 MG/ML IJ SOLN
INTRAMUSCULAR | Status: AC
  Filled 2016-06-05: qty 1

## 2016-06-05 MED ORDER — SUCCINYLCHOLINE CHLORIDE 20 MG/ML IJ SOLN
INTRAMUSCULAR | Status: DC | PRN
  Administered 2016-06-05: 100 mg via INTRAVENOUS

## 2016-06-05 MED ORDER — DEXAMETHASONE SODIUM PHOSPHATE 4 MG/ML IJ SOLN
INTRAMUSCULAR | Status: DC | PRN
  Administered 2016-06-05: 5 mg via INTRAVENOUS

## 2016-06-05 MED ORDER — BUPIVACAINE HCL (PF) 0.5 % IJ SOLN
INTRAMUSCULAR | Status: DC | PRN
  Administered 2016-06-05: 7 mL

## 2016-06-05 MED ORDER — HEPARIN SOD (PORK) LOCK FLUSH 100 UNIT/ML IV SOLN
INTRAVENOUS | Status: DC | PRN
  Administered 2016-06-05: 500 [IU] via INTRAVENOUS

## 2016-06-05 MED ORDER — GLYCOPYRROLATE 0.2 MG/ML IJ SOLN
0.2000 mg | Freq: Once | INTRAMUSCULAR | Status: DC | PRN
Start: 2016-06-05 — End: 2016-06-05

## 2016-06-05 MED ORDER — CHLORHEXIDINE GLUCONATE CLOTH 2 % EX PADS: 6.0000 | MEDICATED_PAD | Freq: Once | CUTANEOUS | Status: DC

## 2016-06-05 MED ORDER — HEPARIN (PORCINE) IN NACL 2-0.9 UNIT/ML-% IJ SOLN
INTRAMUSCULAR | Status: AC
  Filled 2016-06-05: qty 500

## 2016-06-05 MED ORDER — LIDOCAINE-EPINEPHRINE 1 %-1:100000 IJ SOLN
INTRAMUSCULAR | Status: AC
  Filled 2016-06-05: qty 2

## 2016-06-05 MED ORDER — ONDANSETRON HCL 4 MG/2ML IJ SOLN
INTRAMUSCULAR | Status: AC
  Filled 2016-06-05: qty 2

## 2016-06-05 MED ORDER — IOPAMIDOL (ISOVUE-300) INJECTION 61%
INTRAVENOUS | Status: AC
  Filled 2016-06-05: qty 50

## 2016-06-05 MED ORDER — HEPARIN SOD (PORK) LOCK FLUSH 100 UNIT/ML IV SOLN
INTRAVENOUS | Status: AC
  Filled 2016-06-05: qty 5

## 2016-06-05 MED ORDER — ONDANSETRON HCL 4 MG/2ML IJ SOLN
INTRAMUSCULAR | Status: DC | PRN
  Administered 2016-06-05: 4 mg via INTRAVENOUS

## 2016-06-05 MED ORDER — FENTANYL CITRATE (PF) 100 MCG/2ML IJ SOLN: 25.0000 ug | INTRAMUSCULAR | Status: DC | PRN

## 2016-06-05 MED ORDER — HEPARIN (PORCINE) IN NACL 2-0.9 UNIT/ML-% IJ SOLN
INTRAMUSCULAR | Status: DC | PRN
  Administered 2016-06-05: 4 mL via INTRAVENOUS

## 2016-06-05 MED ORDER — MIDAZOLAM HCL 2 MG/2ML IJ SOLN: 1.0000 mg | INTRAMUSCULAR | Status: DC | PRN

## 2016-06-05 MED ORDER — HYDROCODONE-ACETAMINOPHEN 5-325 MG PO TABS: 1.0000 | ORAL_TABLET | ORAL | 0 refills | Status: DC | PRN

## 2016-06-05 MED ORDER — PROPOFOL 10 MG/ML IV BOLUS
INTRAVENOUS | Status: DC | PRN
Start: 2016-06-05 — End: 2016-06-05
  Administered 2016-06-05: 100 mg via INTRAVENOUS

## 2016-06-05 SURGICAL SUPPLY — 51 items
BAG DECANTER FOR FLEXI CONT (MISCELLANEOUS) ×3 IMPLANT
BENZOIN TINCTURE PRP APPL 2/3 (GAUZE/BANDAGES/DRESSINGS) IMPLANT
BLADE HEX COATED 2.75 (ELECTRODE) IMPLANT
BLADE SURG 15 STRL LF DISP TIS (BLADE) ×1 IMPLANT
BLADE SURG 15 STRL SS (BLADE) ×2
CANISTER SUCT 1200ML W/VALVE (MISCELLANEOUS) IMPLANT
CHLORAPREP W/TINT 26ML (MISCELLANEOUS) ×3 IMPLANT
CLOSURE WOUND 1/2 X4 (GAUZE/BANDAGES/DRESSINGS)
COVER BACK TABLE 60X90IN (DRAPES) ×3 IMPLANT
COVER MAYO STAND STRL (DRAPES) ×3 IMPLANT
COVER PROBE 5X48 (MISCELLANEOUS)
DECANTER SPIKE VIAL GLASS SM (MISCELLANEOUS) IMPLANT
DERMABOND ADVANCED (GAUZE/BANDAGES/DRESSINGS) ×2
DERMABOND ADVANCED .7 DNX12 (GAUZE/BANDAGES/DRESSINGS) ×1 IMPLANT
DRAPE C-ARM 42X72 X-RAY (DRAPES) ×3 IMPLANT
DRAPE LAPAROSCOPIC ABDOMINAL (DRAPES) ×3 IMPLANT
DRAPE UTILITY XL STRL (DRAPES) ×3 IMPLANT
DRSG TEGADERM 2-3/8X2-3/4 SM (GAUZE/BANDAGES/DRESSINGS) IMPLANT
DRSG TEGADERM 4X10 (GAUZE/BANDAGES/DRESSINGS) IMPLANT
DRSG TEGADERM 4X4.75 (GAUZE/BANDAGES/DRESSINGS) IMPLANT
ELECT REM PT RETURN 9FT ADLT (ELECTROSURGICAL) ×3
ELECTRODE REM PT RTRN 9FT ADLT (ELECTROSURGICAL) ×1 IMPLANT
GLOVE EUDERMIC 7 POWDERFREE (GLOVE) ×3 IMPLANT
GOWN STRL REUS W/ TWL LRG LVL3 (GOWN DISPOSABLE) ×1 IMPLANT
GOWN STRL REUS W/ TWL XL LVL3 (GOWN DISPOSABLE) ×1 IMPLANT
GOWN STRL REUS W/TWL LRG LVL3 (GOWN DISPOSABLE) ×2
GOWN STRL REUS W/TWL XL LVL3 (GOWN DISPOSABLE) ×2
IV CATH PLACEMENT UNIT 16 GA (IV SOLUTION) IMPLANT
IV KIT MINILOC 20X1 SAFETY (NEEDLE) IMPLANT
KIT CVR 48X5XPRB PLUP LF (MISCELLANEOUS) IMPLANT
KIT PORT POWER 8FR ISP CVUE (Catheter) ×3 IMPLANT
NEEDLE BLUNT 17GA (NEEDLE) IMPLANT
NEEDLE HYPO 22GX1.5 SAFETY (NEEDLE) ×3 IMPLANT
NEEDLE HYPO 25X1 1.5 SAFETY (NEEDLE) ×3 IMPLANT
PACK BASIN DAY SURGERY FS (CUSTOM PROCEDURE TRAY) ×3 IMPLANT
PENCIL BUTTON HOLSTER BLD 10FT (ELECTRODE) ×3 IMPLANT
SET SHEATH INTRODUCER 10FR (MISCELLANEOUS) IMPLANT
SHEATH COOK PEEL AWAY SET 9F (SHEATH) IMPLANT
SLEEVE SCD COMPRESS KNEE MED (MISCELLANEOUS) ×3 IMPLANT
SPONGE GAUZE 4X4 12PLY STER LF (GAUZE/BANDAGES/DRESSINGS) IMPLANT
STRIP CLOSURE SKIN 1/2X4 (GAUZE/BANDAGES/DRESSINGS) IMPLANT
SUT MNCRL AB 4-0 PS2 18 (SUTURE) ×3 IMPLANT
SUT PROLENE 2 0 CT2 30 (SUTURE) ×3 IMPLANT
SUT VICRYL 3-0 CR8 SH (SUTURE) ×3 IMPLANT
SYR 10ML LL (SYRINGE) ×3 IMPLANT
SYR 5ML LUER SLIP (SYRINGE) ×3 IMPLANT
TOWEL OR 17X24 6PK STRL BLUE (TOWEL DISPOSABLE) ×6 IMPLANT
TOWEL OR NON WOVEN STRL DISP B (DISPOSABLE) ×3 IMPLANT
TUBE CONNECTING 20'X1/4 (TUBING)
TUBE CONNECTING 20X1/4 (TUBING) IMPLANT
YANKAUER SUCT BULB TIP NO VENT (SUCTIONS) IMPLANT

## 2016-06-05 NOTE — Anesthesia Preprocedure Evaluation (Addendum)
Anesthesia Evaluation  Patient identified by MRN, date of birth, ID band Patient awake    Reviewed: Allergy & Precautions, NPO status , Patient's Chart, lab work & pertinent test results  History of Anesthesia Complications (+) PONV  Airway Mallampati: I  TM Distance: >3 FB     Dental  (+) Teeth Intact   Pulmonary    breath sounds clear to auscultation       Cardiovascular hypertension, Pt. on medications  Rhythm:Regular Rate:Normal     Neuro/Psych    GI/Hepatic GERD  Medicated,  Endo/Other  diabetes, Type 2, Oral Hypoglycemic AgentsHypothyroidism   Renal/GU      Musculoskeletal   Abdominal   Peds  Hematology   Anesthesia Other Findings   Reproductive/Obstetrics                            Anesthesia Physical Anesthesia Plan  ASA: III  Anesthesia Plan: General   Post-op Pain Management:    Induction:   Airway Management Planned: Oral ETT  Additional Equipment:   Intra-op Plan:   Post-operative Plan: Extubation in OR  Informed Consent: I have reviewed the patients History and Physical, chart, labs and discussed the procedure including the risks, benefits and alternatives for the proposed anesthesia with the patient or authorized representative who has indicated his/her understanding and acceptance.     Plan Discussed with: CRNA  Anesthesia Plan Comments: (Has active and frequent reflux )        Anesthesia Quick Evaluation

## 2016-06-05 NOTE — Transfer of Care (Signed)
Immediate Anesthesia Transfer of Care Note  Patient: Monique Figueroa  Procedure(s) Performed: Procedure(s): INSERTION PORT-A-CATH WITH [place] (N/A)  Patient Location: PACU  Anesthesia Type:General  Level of Consciousness: awake, alert , oriented and patient cooperative  Airway & Oxygen Therapy: Patient Spontanous Breathing and Patient connected to face mask oxygen  Post-op Assessment: Report given to RN and Post -op Vital signs reviewed and stable  Post vital signs: Reviewed and stable  Last Vitals:  Vitals:   06/05/16 1018 06/05/16 1030  BP:  (!) 142/75  Pulse: 99 82  Resp: 15 16  Temp:      Last Pain:  Vitals:   06/05/16 1045  TempSrc:   PainSc: 0-No pain      Patients Stated Pain Goal: 2 (0000000 [id number])  Complications: No apparent anesthesia complications

## 2016-06-05 NOTE — Anesthesia Postprocedure Evaluation (Signed)
Anesthesia Post Note  Patient: Monique Figueroa  Procedure(s) Performed: Procedure(s) (LRB): INSERTION PORT-A-CATH WITH [place] (N/A)  Patient location during evaluation: PACU Anesthesia Type: General Level of consciousness: awake and alert Pain management: pain level controlled Vital Signs Assessment: post-procedure vital signs reviewed and stable Respiratory status: spontaneous breathing, nonlabored ventilation, respiratory function stable and patient connected to nasal cannula oxygen Cardiovascular status: blood pressure returned to baseline and stable Postop Assessment: no signs of nausea or vomiting Anesthetic complications: no    Last Vitals:  Vitals:   06/05/16 1030 06/05/16 1119  BP: (!) 142/75 (!) 162/91  Pulse: 82 90  Resp: 16 20  Temp:  36.4 C    Last Pain:  Vitals:   06/05/16 1119  TempSrc: Oral  PainSc: 1                  Dare Sanger,JAMES TERRILL

## 2016-06-05 NOTE — Transfer of Care (Signed)
Immediate Anesthesia Transfer of Care Note  Patient: Monique Figueroa  Procedure(s) Performed: Procedure(s): INSERTION PORT-A-CATH WITH [place] (N/A)  Patient Location: PACU  Anesthesia Type:General  Level of Consciousness: awake, alert , oriented and patient cooperative  Airway & Oxygen Therapy: Patient Spontanous Breathing and Patient connected to face mask oxygen  Post-op Assessment: Report given to RN and Post -op Vital signs reviewed and stable  Post vital signs: Reviewed and stable  Last Vitals:  Vitals:   06/05/16 0835  BP: (!) 146/78  Pulse: 93  Resp: 18  Temp: 36.6 C    Last Pain:  Vitals:   06/05/16 0835  TempSrc: Oral  PainSc: 4       Patients Stated Pain Goal: 2 (0000000 [id number])  Complications: No apparent anesthesia complications

## 2016-06-05 NOTE — Anesthesia Procedure Notes (Signed)
Procedure Name: Intubation Date/Time: 06/05/2016 9:26 AM Performed by: Fountain Derusha D Pre-anesthesia Checklist: Patient identified, Emergency Drugs available, Suction available and Patient being monitored Patient Re-evaluated:Patient Re-evaluated prior to inductionOxygen Delivery Method: Circle system utilized Preoxygenation: Pre-oxygenation with 100% oxygen Intubation Type: IV induction Ventilation: Mask ventilation without difficulty Laryngoscope Size: Mac and 3 Grade View: Grade I Tube type: Oral Tube size: 7.0 mm Number of attempts: 1 Airway Equipment and Method: Stylet and Oral airway Placement Confirmation: ETT inserted through vocal cords under direct vision,  positive ETCO2 and breath sounds checked- equal and bilateral Secured at: 21 cm Tube secured with: Tape Dental Injury: Teeth and Oropharynx as per pre-operative assessment

## 2016-06-05 NOTE — Interval H&P Note (Signed)
History and Physical Interval Note:  06/05/2016 9:07 AM  Monique Figueroa Standing  has presented today for surgery, with the diagnosis of METASTATIC BREAST CANCER  The various methods of treatment have been discussed with the patient and family. After consideration of risks, benefits and other options for treatment, the patient has consented to  Procedure(s): INSERTION PORT-A-CATH WITH [place] (N/A) as a surgical intervention .  The patient's history has been reviewed, patient examined, no change in status, stable for surgery.  I have reviewed the patient's chart and labs.  Questions were answered to the patient's satisfaction.     Adin Hector

## 2016-06-05 NOTE — Op Note (Signed)
Patient Name:           Monique Figueroa   Date of Surgery:        06/05/2016  Pre op Diagnosis:      Stage IV breast cancer  Post op Diagnosis:    Stage IV breast cancer  Procedure:                 Insertion of ClearVue  PowerPort 8 French venous vascular access device                                     Use of fluoroscopy for guidance and positioning   Surgeon:                     Edsel Petrin. Dalbert Batman, M.D., FACS  Assistant:                      OR staff  Operative Indications: . This is a very pleasant 82 year old female, former patient of mine. Referred to me by Dr. [name] because of recent diagnosis of metastatic disease.   Dr. [name] is her PCP      She was diagnosed with cancer of the left breast on June 05, 2006, receptor positive. On June 19, 2006 she underwent a left lumpectomy with finding of stage TIa, N0 cancer, 3 mm tumor, high-grade DCIS, margins negative, receptor positive. HER-2 positive. She received adjuvant radiation therapy and Arimidex for 5 years. She feels fine except she's had some upper abdominal discomfort and her reflux symptoms have gotten a little bit worse. Dr. Maudie Mercury found that her liver function tests were elevated and her ultrasound of her liver looked abnormal. She subsequently had CT scans and PET scan. She has multiple liver lesions. PET CT is positive in the left axilla, left supraclavicular, right costophrenic angle, and upper abdomen. Liver biopsy performed on May 15, 2016 is positive for metastatic adenocarcinoma, estrogen receptor weakly positive, PR negative. HER-2 positive. Dr. [name] has started on her on antiestrogen therapy and requested a Port-A-Cath so she can receive Taxotere, Herceptin and projeta. Comorbidities include a thyroid cancer 30 years ago no recurrence. Hypertension. Hypothyroidism, surgically induced, non-insulin-dependent diabetes mellitus, GERD. Reflux symptoms are a little bit worse lately lately. She  takes Zantac      She is scheduled for Port-A-Cath insertion on November 1, 2 days from now. I discussed the indications, details, techniques, and numerous risk of the surgery with her. I gave her patient information booklets and pictures. She is aware of the risk of bleeding, infection, bilateral attempts, air embolus, pneumothorax, subsequent revision if there's clotting or failure. She understands all these issues. All of her questions were answered. She agrees with this plan.  Operative Findings:       We were able to access the right subclavian vein without difficulty.  The port and catheter were inserted through the right subclavian vein.  Imaging in the operating room at the completion of the case shows the catheter tip properly positioned in the superior vena cava, no deformity.  The catheter flushed well and had excellent blood return.  Procedure in Detail:          Following the induction of general LMA anesthesia the patient was positioned with a small roll behind her shoulders and her arms tucked at her sides.  The neck and chest were prepped and draped  in a sterile fashion.  Surgical timeout was performed.  Intravenous antibiotics were given.  0.5% Marcaine was used as local infiltration anesthetic.     A right subclavian venipuncture was performed.  Blood return on the first pass.  Guidewire was threaded easily.  Fluoroscopy confirmed that the wire was properly positioned in the superior vena cava.  A small incision was made at the wire insertion site.  Transverse incision was created below the midpoint of the clavicle.  Subcutaneous pocket was created.  Using a tunneling device I passed the catheter from the port pocket site to the wire insertion site.  Using fluoroscopy I marked a template on the chest wall to guide catheter length and positioning.  Using this template as a guide I cut the catheter 19.5 cm.  The catheter was then secured to the port with the locking device and  flushed with heparinized saline.  The port was sutured to the pectoralis fascia with 3 interrupted sutures of 2-0 Prolene.  The dilator and peel-away sheath assembly were inserted over the guidewire.  The wire and dilator were removed, the catheter threaded through the peel-away sheath, and the peel-away sheath removed.  Catheter flushed easily and had excellent blood return.  Imaging showed good positioning as discussed above..  The catheter was flushed with concentrated heparin.    The subcutaneous tissue was closed with 3-0 Vicryl and the skin incisions closed with 4-0 Monocryl subcuticular sutures and Dermabond.  Patient tolerated the procedure well was taken to PACU in stable condition.  Chest x-ray is planned.  EBL 10 mL.  Counts correct.  Complications none.    Edsel Petrin. Dalbert Batman, M.D., FACS General and Minimally Invasive Surgery Breast and Colorectal Surgery  06/05/2016 10:16 AM

## 2016-06-05 NOTE — Discharge Instructions (Signed)

## 2016-06-06 ENCOUNTER — Encounter (HOSPITAL_BASED_OUTPATIENT_CLINIC_OR_DEPARTMENT_OTHER): Payer: Self-pay | Admitting: General Surgery

## 2016-06-13 ENCOUNTER — Other Ambulatory Visit (HOSPITAL_BASED_OUTPATIENT_CLINIC_OR_DEPARTMENT_OTHER): Payer: Medicare Other

## 2016-06-13 ENCOUNTER — Ambulatory Visit (HOSPITAL_BASED_OUTPATIENT_CLINIC_OR_DEPARTMENT_OTHER): Payer: Medicare Other | Admitting: Oncology

## 2016-06-13 ENCOUNTER — Ambulatory Visit (HOSPITAL_BASED_OUTPATIENT_CLINIC_OR_DEPARTMENT_OTHER): Payer: Medicare Other

## 2016-06-13 ENCOUNTER — Encounter: Payer: Self-pay | Admitting: Oncology

## 2016-06-13 VITALS — BP 138/79 | HR 86 | Temp 97.9°F | Resp 18

## 2016-06-13 DIAGNOSIS — C50919 Malignant neoplasm of unspecified site of unspecified female breast: Secondary | ICD-10-CM

## 2016-06-13 DIAGNOSIS — C787 Secondary malignant neoplasm of liver and intrahepatic bile duct: Secondary | ICD-10-CM

## 2016-06-13 DIAGNOSIS — T8090XA Unspecified complication following infusion and therapeutic injection, initial encounter: Secondary | ICD-10-CM | POA: Diagnosis not present

## 2016-06-13 DIAGNOSIS — Z5111 Encounter for antineoplastic chemotherapy: Secondary | ICD-10-CM | POA: Diagnosis not present

## 2016-06-13 DIAGNOSIS — Z5112 Encounter for antineoplastic immunotherapy: Secondary | ICD-10-CM | POA: Diagnosis not present

## 2016-06-13 LAB — COMPREHENSIVE METABOLIC PANEL
ALBUMIN: 3.1 g/dL — AB (ref 3.5–5.0)
ALK PHOS: 238 U/L — AB (ref 40–150)
ALT: 60 U/L — AB (ref 0–55)
AST: 268 U/L — AB (ref 5–34)
Anion Gap: 12 mEq/L — ABNORMAL HIGH (ref 3–11)
BILIRUBIN TOTAL: 1.71 mg/dL — AB (ref 0.20–1.20)
BUN: 10.5 mg/dL (ref 7.0–26.0)
CO2: 23 meq/L (ref 22–29)
CREATININE: 0.8 mg/dL (ref 0.6–1.1)
Calcium: 9.2 mg/dL (ref 8.4–10.4)
Chloride: 104 mEq/L (ref 98–109)
EGFR: 65 mL/min/{1.73_m2} — ABNORMAL LOW (ref >90)
GLUCOSE: 143 mg/dL — AB (ref 70–140)
Potassium: 4.3 mEq/L (ref 3.5–5.1)
SODIUM: 139 meq/L (ref 136–145)
TOTAL PROTEIN: 7.2 g/dL (ref 6.4–8.3)

## 2016-06-13 LAB — CBC WITH DIFFERENTIAL/PLATELET
BASO%: 0.4 % (ref 0.0–2.0)
Basophils Absolute: 0 10*3/uL (ref 0.0–0.1)
EOS ABS: 0 10*3/uL (ref 0.0–0.5)
EOS%: 0.6 % (ref 0.0–7.0)
HCT: 42 % (ref 34.8–46.6)
HEMOGLOBIN: 14.3 g/dL (ref 11.6–15.9)
LYMPH%: 17.3 % (ref 14.0–49.7)
MCH: 32.1 pg (ref 25.1–34.0)
MCHC: 34 g/dL (ref 31.5–36.0)
MCV: 94.2 fL (ref 79.5–101.0)
MONO#: 0.7 10*3/uL (ref 0.1–0.9)
MONO%: 12.8 % (ref 0.0–14.0)
NEUT%: 68.9 % (ref 38.4–76.8)
NEUTROS ABS: 3.7 10*3/uL (ref 1.5–6.5)
Platelets: 203 10*3/uL (ref 145–400)
RBC: 4.46 10*6/uL (ref 3.70–5.45)
RDW: 15.6 % — AB (ref 11.2–14.5)
WBC: 5.4 10*3/uL (ref 3.9–10.3)
lymph#: 0.9 10*3/uL (ref 0.9–3.3)

## 2016-06-13 MED ORDER — METHYLPREDNISOLONE SODIUM SUCC 125 MG IJ SOLR
125.0000 mg | Freq: Once | INTRAMUSCULAR | Status: AC | PRN
  Administered 2016-06-13: 125 mg via INTRAVENOUS

## 2016-06-13 MED ORDER — DIPHENHYDRAMINE HCL 25 MG PO CAPS
ORAL_CAPSULE | ORAL | Status: AC
  Filled 2016-06-13: qty 2

## 2016-06-13 MED ORDER — ACETAMINOPHEN 325 MG PO TABS
650.0000 mg | ORAL_TABLET | Freq: Once | ORAL | Status: AC
  Administered 2016-06-13: 650 mg via ORAL

## 2016-06-13 MED ORDER — SODIUM CHLORIDE 0.9 % IV SOLN
Freq: Once | INTRAVENOUS | Status: AC
  Administered 2016-06-13: 11:00:00 via INTRAVENOUS

## 2016-06-13 MED ORDER — SODIUM CHLORIDE 0.9 % IV SOLN
840.0000 mg | Freq: Once | INTRAVENOUS | Status: AC
  Administered 2016-06-13: 840 mg via INTRAVENOUS
  Filled 2016-06-13: qty 28

## 2016-06-13 MED ORDER — SODIUM CHLORIDE 0.9% FLUSH
10.0000 mL | INTRAVENOUS | Status: DC | PRN
  Administered 2016-06-13: 10 mL
  Filled 2016-06-13: qty 10

## 2016-06-13 MED ORDER — TRASTUZUMAB CHEMO 150 MG IV SOLR
8.0000 mg/kg | Freq: Once | INTRAVENOUS | Status: AC
  Administered 2016-06-13: 609 mg via INTRAVENOUS
  Filled 2016-06-13: qty 29

## 2016-06-13 MED ORDER — MEPERIDINE HCL 25 MG/ML IJ SOLN
INTRAMUSCULAR | Status: AC
  Filled 2016-06-13: qty 1

## 2016-06-13 MED ORDER — ONDANSETRON HCL 4 MG/2ML IJ SOLN
8.0000 mg | Freq: Once | INTRAMUSCULAR | Status: AC
  Administered 2016-06-13: 8 mg via INTRAVENOUS

## 2016-06-13 MED ORDER — DIPHENHYDRAMINE HCL 25 MG PO CAPS
50.0000 mg | ORAL_CAPSULE | Freq: Once | ORAL | Status: AC
  Administered 2016-06-13: 50 mg via ORAL

## 2016-06-13 MED ORDER — ACETAMINOPHEN 325 MG PO TABS
ORAL_TABLET | ORAL | Status: AC
  Filled 2016-06-13: qty 2

## 2016-06-13 MED ORDER — MEPERIDINE HCL 25 MG/ML IJ SOLN
12.5000 mg | Freq: Once | INTRAMUSCULAR | Status: AC
  Administered 2016-06-13: 12.5 mg via INTRAVENOUS

## 2016-06-13 MED ORDER — HEPARIN SOD (PORK) LOCK FLUSH 100 UNIT/ML IV SOLN
500.0000 [IU] | Freq: Once | INTRAVENOUS | Status: AC | PRN
  Administered 2016-06-13: 500 [IU]
  Filled 2016-06-13: qty 5

## 2016-06-13 MED ORDER — FAMOTIDINE IN NACL 20-0.9 MG/50ML-% IV SOLN
20.0000 mg | Freq: Once | INTRAVENOUS | Status: AC | PRN
  Administered 2016-06-13: 20 mg via INTRAVENOUS

## 2016-06-13 NOTE — Patient Instructions (Signed)
[facility] Discharge Instructions for Patients Receiving Chemotherapy  Today you received the following chemotherapy agents: Herceptin and Perjeta.  To help prevent nausea and vomiting after your treatment, we encourage you to take your nausea medication as directed.   If you develop nausea and vomiting that is not controlled by your nausea medication, call the clinic.   BELOW ARE SYMPTOMS THAT SHOULD BE REPORTED IMMEDIATELY:  *FEVER GREATER THAN 100.5 F  *CHILLS WITH OR WITHOUT FEVER  NAUSEA AND VOMITING THAT IS NOT CONTROLLED WITH YOUR NAUSEA MEDICATION  *UNUSUAL SHORTNESS OF BREATH  *UNUSUAL BRUISING OR BLEEDING  TENDERNESS IN MOUTH AND THROAT WITH OR WITHOUT PRESENCE OF ULCERS  *URINARY PROBLEMS  *BOWEL PROBLEMS  UNUSUAL RASH Items with * indicate a potential emergency and should be followed up as soon as possible.  Feel free to call the clinic you have any questions or concerns. The clinic phone number is [phone].  Please show the [facility] at check-in to the Emergency Department and triage nurse.  Trastuzumab injection for infusion What is this medicine? TRASTUZUMAB (tras TOO zoo mab) is a monoclonal antibody. It is used to treat breast cancer and stomach cancer. This medicine may be used for other purposes; ask your health care provider or pharmacist if you have questions. What should I tell my health care provider before I take this medicine? They need to know if you have any of these conditions: -heart disease -heart failure -infection (especially a virus infection such as chickenpox, cold sores, or herpes) -lung or breathing disease, like asthma -recent or ongoing radiation therapy -an unusual or allergic reaction to trastuzumab, benzyl alcohol, or other medications, foods, dyes, or preservatives -pregnant or trying to get pregnant -breast-feeding How should I use this medicine? This drug is given as an infusion into a vein. It  is administered in a hospital or clinic by a specially trained health care professional. Talk to your pediatrician regarding the use of this medicine in children. This medicine is not approved for use in children. Overdosage: If you think you have taken too much of this medicine contact a poison control center or emergency room at once. NOTE: This medicine is only for you. Do not share this medicine with others. What if I miss a dose? It is important not to miss a dose. Call your doctor or health care professional if you are unable to keep an appointment. What may interact with this medicine? -doxorubicin -warfarin This list may not describe all possible interactions. Give your health care provider a list of all the medicines, herbs, non-prescription drugs, or dietary supplements you use. Also tell them if you smoke, drink alcohol, or use illegal drugs. Some items may interact with your medicine. What should I watch for while using this medicine? Visit your doctor for checks on your progress. Report any side effects. Continue your course of treatment even though you feel ill unless your doctor tells you to stop. Call your doctor or health care professional for advice if you get a fever, chills or sore throat, or other symptoms of a cold or flu. Do not treat yourself. Try to avoid being around people who are sick. You may experience fever, chills and shaking during your first infusion. These effects are usually mild and can be treated with other medicines. Report any side effects during the infusion to your health care professional. Fever and chills usually do not happen with later infusions. Do not become pregnant while taking this medicine  or for 7 months after stopping it. Women should inform their doctor if they wish to become pregnant or think they might be pregnant. Women of child-bearing potential will need to have a negative pregnancy test before starting this medicine. There is a potential for  serious side effects to an unborn child. Talk to your health care professional or pharmacist for more information. Do not breast-feed an infant while taking this medicine or for 7 months after stopping it. Women must use effective birth control with this medicine. What side effects may I notice from receiving this medicine? Side effects that you should report to your doctor or other health care professional as soon as possible: -breathing difficulties -chest pain or palpitations -cough -dizziness or fainting -fever or chills, sore throat -skin rash, itching or hives -swelling of the legs or ankles -unusually weak or tired Side effects that usually do not require medical attention (report to your doctor or other health care professional if they continue or are bothersome): -loss of appetite -headache -muscle aches -nausea This list may not describe all possible side effects. Call your doctor for medical advice about side effects. You may report side effects to FDA at 1-800-FDA-1088. Where should I keep my medicine? This drug is given in a hospital or clinic and will not be stored at home. NOTE: This sheet is a summary. It may not cover all possible information. If you have questions about this medicine, talk to your doctor, pharmacist, or health care provider.    2016, Elsevier/Gold Standard. (2014-10-28 11:49:32) Pertuzumab injection What is this medicine? PERTUZUMAB (per TOOZ ue mab) is a monoclonal antibody. It is used to treat breast cancer. This medicine may be used for other purposes; ask your health care provider or pharmacist if you have questions. What should I tell my health care provider before I take this medicine? They need to know if you have any of these conditions: -heart disease -heart failure -high blood pressure -history of irregular heart beat -recent or ongoing radiation therapy -an unusual or allergic reaction to pertuzumab, other medicines, foods, dyes, or  preservatives -pregnant or trying to get pregnant -breast-feeding How should I use this medicine? This medicine is for infusion into a vein. It is given by a health care professional in a hospital or clinic setting. Talk to your pediatrician regarding the use of this medicine in children. Special care may be needed. Overdosage: If you think you have taken too much of this medicine contact a poison control center or emergency room at once. NOTE: This medicine is only for you. Do not share this medicine with others. What if I miss a dose? It is important not to miss your dose. Call your doctor or health care professional if you are unable to keep an appointment. What may interact with this medicine? Interactions are not expected. Give your health care provider a list of all the medicines, herbs, non-prescription drugs, or dietary supplements you use. Also tell them if you smoke, drink alcohol, or use illegal drugs. Some items may interact with your medicine. This list may not describe all possible interactions. Give your health care provider a list of all the medicines, herbs, non-prescription drugs, or dietary supplements you use. Also tell them if you smoke, drink alcohol, or use illegal drugs. Some items may interact with your medicine. What should I watch for while using this medicine? Your condition will be monitored carefully while you are receiving this medicine. Report any side effects. Continue your course of  treatment even though you feel ill unless your doctor tells you to stop. Do not become pregnant while taking this medicine or for 7 months after stopping it. Women should inform their doctor if they wish to become pregnant or think they might be pregnant. Women of child-bearing potential will need to have a negative pregnancy test before starting this medicine. There is a potential for serious side effects to an unborn child. Talk to your health care professional or pharmacist for more  information. Do not breast-feed an infant while taking this medicine or for 7 months after stopping it. Women must use effective birth control with this medicine. Call your doctor or health care professional for advice if you get a fever, chills or sore throat, or other symptoms of a cold or flu. Do not treat yourself. Try to avoid being around people who are sick. You may experience fever, chills, and headache during the infusion. Report any side effects during the infusion to your health care professional. What side effects may I notice from receiving this medicine? Side effects that you should report to your doctor or health care professional as soon as possible: -breathing problems -chest pain or palpitations -dizziness -feeling faint or lightheaded -fever or chills -skin rash, itching or hives -sore throat -swelling of the face, lips, or tongue -swelling of the legs or ankles -unusually weak or tired Side effects that usually do not require medical attention (Report these to your doctor or health care professional if they continue or are bothersome.): -diarrhea -hair loss -nausea, vomiting -tiredness This list may not describe all possible side effects. Call your doctor for medical advice about side effects. You may report side effects to FDA at 1-800-FDA-1088. Where should I keep my medicine? This drug is given in a hospital or clinic and will not be stored at home. NOTE: This sheet is a summary. It may not cover all possible information. If you have questions about this medicine, talk to your doctor, pharmacist, or health care provider.    2016, Elsevier/Gold Standard. (2014-10-28 16:07:57)

## 2016-06-13 NOTE — Progress Notes (Signed)
SYMPTOM MANAGEMENT CLINIC    Chief Complaint: Infusion reaction  HPI:  Monique Figueroa 82 y.o. female diagnosed with metastatic breast cancer who came in today for her first infusion of Herceptin with Perjeta. The patient had approximate 15 minutes left on her infusion of Herceptin when she developed rigors. The patient reported feeling very cold. She denied having any problems with chest pain or shortness of breath. She had received Solu-Medrol 125 mg prior to my arrival. She continued to have rigors while I was present in the room. Blood pressure was elevated from baseline. Heart rate, O2 saturation, and temperature were at baseline.    Metastatic breast cancer ([place])   06/05/2006 Initial Diagnosis    Left breast biopsy: DCIS with apocrine features ER 30%, PR 13%      06/19/2006 Surgery    Left lumpectomy: stage I, pT1a, pN0(i)(sn), pMX, 0.3 cm IDC, grade 1, with extensive high-grade DCIS , margins negative ER 38 %, PR 93 %, Ki-67 10%, HER-2/neu 2+, no additional tissue available for FISH testing, with 0/1 left axillary lymph nodes.      07/30/2006 - 09/17/2006 Radiation Therapy    Adjuvant radiation therapy      09/24/2006 - 09/25/2011 Anti-estrogen oral therapy    Antiestrogen therapy with Arimidex 5 years      04/26/2016 Relapse/Recurrence    Innumerable hepatic lesions seen in both lobes most are 1-2 cm size somewhat confluent largest in the posterior right hepatic dome 3 x 4.6 cm      05/07/2016 PET scan    PET/CT scan: Extensive metastatic involvement of both lobes of the liver, primary could be liver, phalangeal or metastatic disease, hypermetabolic thoracic LN left axillary, left supraclavicular and right CP angle LN, hypermetabolic upper abdominal LN      05/16/2016 Initial Biopsy    Liver biopsy: Metastatic carcinoma breast primary strong positivity for CK 7 weak focal positivity for ER, GCDFP, negative for PR, CDX 2, TTF-1, WT 1; HER-2 positive       Review of  Systems  Constitutional: Positive for chills.  HENT: Negative.   Eyes: Negative.   Respiratory: Negative.   Cardiovascular: Negative.   Gastrointestinal: Positive for abdominal pain, nausea and vomiting.  Genitourinary: Negative.   Musculoskeletal: Negative.   Skin: Negative.   Neurological: Negative.   Endo/Heme/Allergies: Negative.   Psychiatric/Behavioral: Negative.     Past Medical History:  Diagnosis Date  . Appendicitis   . Breast cancer ([place]) 06/2006   Invasive ductal and DCIS of left breast  . Breast tumor    History of bilateral breast tumors/cysts  . Cyst of spinal meninges   . Diabetes mellitus without complication ([place])   . GERD (gastroesophageal reflux disease)   . Hypertension   . Hypothyroidism   . Metastatic breast cancer ([place]) 04/26/2016   mets to liver  . PONV (postoperative nausea and vomiting)   . Tuberculosis    medullary carcinoma    Past Surgical History:  Procedure Laterality Date  . APPENDECTOMY    . BREAST CYST EXCISION Bilateral    Several asprirations and excisions  . BREAST LUMPECTOMY WITH AXILLARY LYMPH NODE BIOPSY Left 06/19/2006   Invasive ductal and in situ carcinoma, node negative  . PORTACATH PLACEMENT N/A 06/05/2016   Procedure: INSERTION PORT-A-CATH WITH [place];  Surgeon: Fanny Skates, MD;  Location: [facility];  Service: General;  Laterality: N/A;  . SPINE SURGERY    . TOTAL THYROIDECTOMY      has Metastatic breast  cancer [facility]) and Metastases to the liver [facility]) on her problem list.    is allergic to sulfa antibiotics.    Medication List       Accurate as of 06/13/16  3:21 PM. Always use your most recent med list.          hydrochlorothiazide 25 MG tablet Commonly known as:  HYDRODIURIL Take 25 mg by mouth daily.   HYDROcodone-acetaminophen 5-325 MG tablet Commonly known as:  NORCO/VICODIN Take 1-2 tablets by mouth every 4 (four) hours as needed for moderate pain or severe pain.   letrozole 2.5 MG  tablet Commonly known as:  FEMARA Take 1 tablet (2.5 mg total) by mouth daily.   levothyroxine 125 MCG tablet Commonly known as:  SYNTHROID, LEVOTHROID Take 125 mcg by mouth daily before breakfast.   lidocaine-prilocaine cream Commonly known as:  EMLA Apply to affected area once   loratadine 10 MG tablet Commonly known as:  CLARITIN Take 10 mg by mouth daily as needed for allergies.   metFORMIN 500 MG tablet Commonly known as:  GLUCOPHAGE Take 500 mg by mouth daily with breakfast.   multivitamin with minerals tablet Take 1 tablet by mouth daily.   omeprazole 20 MG tablet Commonly known as:  PRILOSEC OTC Take 20 mg by mouth daily.   ondansetron 8 MG tablet Commonly known as:  ZOFRAN Take 1 tablet (8 mg total) by mouth 2 (two) times daily as needed for refractory nausea / vomiting.   prochlorperazine 10 MG tablet Commonly known as:  COMPAZINE Take 1 tablet (10 mg total) by mouth every 6 (six) hours as needed (Nausea or vomiting).   ranitidine 150 MG tablet Commonly known as:  ZANTAC Take 150 mg by mouth at bedtime.   vitamin B-12 500 MCG tablet Commonly known as:  CYANOCOBALAMIN Take 500 mcg by mouth daily as needed.   vitamin C 500 MG tablet Commonly known as:  ASCORBIC ACID Take 500 mg by mouth daily.        PHYSICAL EXAMINATION  Oncology Vitals 06/13/2016 06/13/2016  Height - -  Weight - -  Weight (lbs) - -  BMI (kg/m2) - -  Temp - -  Pulse 83 81  Resp 18 18  Resp (Historical as of 03/05/12) - -  SpO2 95 97  BSA (m2) - -   BP Readings from Last 2 Encounters:  06/13/16 (!) 153/86  06/05/16 (!) 162/91    Physical Exam  Constitutional: She is oriented to person, place, and time. She appears unhealthy. She appears distressed.  Has rigors.   Eyes: Conjunctivae are normal. Pupils are equal, round, and reactive to light.  Neck: No tracheal deviation present.  Cardiovascular: Normal rate, regular rhythm and normal heart sounds.   Pulmonary/Chest: Effort  normal and breath sounds normal. No stridor. No respiratory distress. She has no wheezes. She has no rales.  Abdominal: Soft. Bowel sounds are normal. There is tenderness.  Lymphadenopathy:    She has no cervical adenopathy.  Neurological: She is alert and oriented to person, place, and time.  Skin: Skin is warm and dry. No rash noted.  Psychiatric: Mood, memory, affect and judgment normal.    LABORATORY DATA:. Appointment on 06/13/2016  Component Date Value Ref Range Status  . WBC 06/13/2016 5.4  3.9 - 10.3 10e3/uL Final  . NEUT# 06/13/2016 3.7  1.5 - 6.5 10e3/uL Final  . HGB 06/13/2016 14.3  11.6 - 15.9 g/dL Final  . HCT 06/13/2016 42.0  34.8 - 46.6 % Final  .  Platelets 06/13/2016 203  145 - 400 10e3/uL Final  . MCV 06/13/2016 94.2  79.5 - 101.0 fL Final  . MCH 06/13/2016 32.1  25.1 - 34.0 pg Final  . MCHC 06/13/2016 34.0  31.5 - 36.0 g/dL Final  . RBC 06/13/2016 4.46  3.70 - 5.45 10e6/uL Final  . RDW 06/13/2016 15.6* 11.2 - 14.5 % Final  . lymph# 06/13/2016 0.9  0.9 - 3.3 10e3/uL Final  . MONO# 06/13/2016 0.7  0.1 - 0.9 10e3/uL Final  . Eosinophils Absolute 06/13/2016 0.0  0.0 - 0.5 10e3/uL Final  . Basophils Absolute 06/13/2016 0.0  0.0 - 0.1 10e3/uL Final  . NEUT% 06/13/2016 68.9  38.4 - 76.8 % Final  . LYMPH% 06/13/2016 17.3  14.0 - 49.7 % Final  . MONO% 06/13/2016 12.8  0.0 - 14.0 % Final  . EOS% 06/13/2016 0.6  0.0 - 7.0 % Final  . BASO% 06/13/2016 0.4  0.0 - 2.0 % Final  . Sodium 06/13/2016 139  136 - 145 mEq/L Final  . Potassium 06/13/2016 4.3  3.5 - 5.1 mEq/L Final  . Chloride 06/13/2016 104  98 - 109 mEq/L Final  . CO2 06/13/2016 23  22 - 29 mEq/L Final  . Glucose 06/13/2016 143* 70 - 140 mg/dl Final  . BUN 06/13/2016 10.5  7.0 - 26.0 mg/dL Final  . Creatinine 06/13/2016 0.8  0.6 - 1.1 mg/dL Final  . Total Bilirubin 06/13/2016 1.71* 0.20 - 1.20 mg/dL Final  . Alkaline Phosphatase 06/13/2016 238* 40 - 150 U/L Final  . AST 06/13/2016 268* 5 - 34 U/L Final  . ALT  06/13/2016 60* 0 - 55 U/L Final  . Total Protein 06/13/2016 7.2  6.4 - 8.3 g/dL Final  . Albumin 06/13/2016 3.1* 3.5 - 5.0 g/dL Final  . Calcium 06/13/2016 9.2  8.4 - 10.4 mg/dL Final  . Anion Gap 06/13/2016 12* 3 - 11 mEq/L Final  . EGFR 06/13/2016 65* >90 ml/min/1.73 m2 Final    RADIOGRAPHIC STUDIES: No results found.  ASSESSMENT/PLAN:    No problem-specific Assessment & Plan notes found for this encounter.  Agent is an 82 year old female with metastatic breast cancer here for her first infusion of Herceptin with Perjeta. With approximate 15 minutes left for the Herceptin infusion she developed rigors. She was given Solu-Medrol 125 mg with Demerol 12.5 mg. After Demerol infusion she reported abdominal discomfort and vomited. Once she stopped vomiting her abdominal pain subsided. She was also given Pepcid 20 mg IV along with Zofran 8 mg IV. After close monitoring, her blood pressure returned to baseline and all the other vital signs remained stable. The remaining Herceptin was not given. The patient did receive her Perjeta without any further issues.   The patient will be discharged home at the completion of her infusion. She was instructed to report to the emergency room she develops any chest pain or shortness of breath. Patient stated understanding of all instructions; and was in agreement with this plan of care. The patient knows to call the clinic with any problems, questions or concerns.   Total time spent with patient was 30 minutes;  with greater than 50 percent of that time spent in face to face counseling regarding patient's symptoms,  and coordination of care and follow up.   Mikey Bussing, NP 06/13/2016

## 2016-06-13 NOTE — Progress Notes (Signed)
At 1315 Herceptin stopped, Rigors noted, complaining of being cold, stomach pain, tightness in chest and nausea. Normal saline started by gravity, see MAR for medications administered. Mikey Bussing, NP notified of reaction and in room. Vomited a small amount after IV demerol given. See vital signs. Per Mikey Bussing, NP do not give the rest of the Herceptin and administer Perjeta.  Perjeta given with no problems.

## 2016-06-13 NOTE — Progress Notes (Signed)
Pt will not receive Taxotere today, per Dr. Jana Hakim. AST 268 today. Ok to proceed with herceptin and Perjeta only until pt appt with Dr. [name] next week.Hassan Rowan, RN aware.

## 2016-06-14 ENCOUNTER — Telehealth: Payer: Self-pay

## 2016-06-14 NOTE — Telephone Encounter (Signed)
Called pt for chemo f/u phone. Pt doing states "I am doing great" Pt denies any cp/sob. States that she has not had any side effects yet, but have instructions on symptom management if it occurs. Pt does not have any other concerns or questions at this time. Pt reminded that she will see Dr. [name] on 06/21/2016 to discuss plan for next treatment.

## 2016-06-20 NOTE — Assessment & Plan Note (Signed)
Metastatic breast cancer with innumerable liver metastases detected on ultrasound and recent MRI of the liver on 04/26/2016 (Prior history of left breast IDC T1 1 N0 stage IA 0.3 cm grade 1 tumor that was ER 38%, PR 93%, Ki-67 10%, HER-2 2+, no tissue for FISH testing, 0/1 lymph node negative, status post lumpectomy radiation and 5 years of Arimidex completed in February 2013)  PET/CT scan: 05/07/2016: Extensive metastatic involvement of both lobes of the liver, primary could be liver, phalangeal or metastatic disease, hypermetabolic thoracic lymph nodes left axillary, left supraclavicular and right CP angle lymph nodes, hypermetabolic upper abdominal lymph nodes  Liver biopsy 05/16/2016: Metastatic carcinoma breast primary strong positivity for CK 7 weak focal positivity for ER, GCDFP, negative for PR, CDX 2, TTF-1, WT 1; HER-2 positive  Treatment plan: 1. Taxotere Herceptin and Perjeta every 3 weeks palliative chemotherapy followed by Herceptin and Perjeta maintenance after 6 cycles started 06/13/16 ----------------------------------------------------------------------------------------------------------------------------------------- Current Treatment: Cycle 1 day 8 Taxotere Herceptin Peerjeta Chemotherapy Toxicities:   Goals of treatment: Palliation of symptoms and prolongation of life Return to clinic in 2 weeks for cycle 2.

## 2016-06-21 ENCOUNTER — Encounter: Payer: Self-pay | Admitting: Hematology and Oncology

## 2016-06-21 ENCOUNTER — Ambulatory Visit (HOSPITAL_BASED_OUTPATIENT_CLINIC_OR_DEPARTMENT_OTHER): Payer: Medicare Other | Admitting: Hematology and Oncology

## 2016-06-21 ENCOUNTER — Other Ambulatory Visit (HOSPITAL_BASED_OUTPATIENT_CLINIC_OR_DEPARTMENT_OTHER): Payer: Medicare Other

## 2016-06-21 VITALS — BP 158/68 | HR 85 | Temp 97.9°F | Resp 18 | Ht 65.0 in | Wt 166.4 lb

## 2016-06-21 DIAGNOSIS — C787 Secondary malignant neoplasm of liver and intrahepatic bile duct: Secondary | ICD-10-CM

## 2016-06-21 DIAGNOSIS — R7989 Other specified abnormal findings of blood chemistry: Secondary | ICD-10-CM

## 2016-06-21 DIAGNOSIS — K219 Gastro-esophageal reflux disease without esophagitis: Secondary | ICD-10-CM | POA: Diagnosis not present

## 2016-06-21 DIAGNOSIS — C50919 Malignant neoplasm of unspecified site of unspecified female breast: Secondary | ICD-10-CM | POA: Diagnosis not present

## 2016-06-21 LAB — COMPREHENSIVE METABOLIC PANEL
ALT: 29 U/L (ref 0–55)
ANION GAP: 12 meq/L — AB (ref 3–11)
AST: 80 U/L — ABNORMAL HIGH (ref 5–34)
Albumin: 2.9 g/dL — ABNORMAL LOW (ref 3.5–5.0)
Alkaline Phosphatase: 185 U/L — ABNORMAL HIGH (ref 40–150)
BILIRUBIN TOTAL: 1.34 mg/dL — AB (ref 0.20–1.20)
BUN: 12.7 mg/dL (ref 7.0–26.0)
CALCIUM: 9.3 mg/dL (ref 8.4–10.4)
CHLORIDE: 104 meq/L (ref 98–109)
CO2: 25 meq/L (ref 22–29)
Creatinine: 0.8 mg/dL (ref 0.6–1.1)
EGFR: 69 mL/min/{1.73_m2} — AB (ref >90)
Glucose: 110 mg/dl (ref 70–140)
Potassium: 4.5 mEq/L (ref 3.5–5.1)
Sodium: 141 mEq/L (ref 136–145)
TOTAL PROTEIN: 7.2 g/dL (ref 6.4–8.3)

## 2016-06-21 LAB — CBC WITH DIFFERENTIAL/PLATELET
BASO%: 1.1 % (ref 0.0–2.0)
Basophils Absolute: 0.1 10*3/uL (ref 0.0–0.1)
EOS ABS: 0.1 10*3/uL (ref 0.0–0.5)
EOS%: 1.5 % (ref 0.0–7.0)
HEMATOCRIT: 43 % (ref 34.8–46.6)
HGB: 14.3 g/dL (ref 11.6–15.9)
LYMPH%: 19.8 % (ref 14.0–49.7)
MCH: 32 pg (ref 25.1–34.0)
MCHC: 33.2 g/dL (ref 31.5–36.0)
MCV: 96.4 fL (ref 79.5–101.0)
MONO#: 0.8 10*3/uL (ref 0.1–0.9)
MONO%: 14.8 % — ABNORMAL HIGH (ref 0.0–14.0)
NEUT#: 3.4 10*3/uL (ref 1.5–6.5)
NEUT%: 62.8 % (ref 38.4–76.8)
PLATELETS: 215 10*3/uL (ref 145–400)
RBC: 4.46 10*6/uL (ref 3.70–5.45)
RDW: 15.1 % — ABNORMAL HIGH (ref 11.2–14.5)
WBC: 5.5 10*3/uL (ref 3.9–10.3)
lymph#: 1.1 10*3/uL (ref 0.9–3.3)

## 2016-06-21 NOTE — Progress Notes (Signed)
Patient Care Team: [name], MD as PCP - General (Internal Medicine)  DIAGNOSIS:  Encounter Diagnoses  Name Primary?  . Metastases to the liver ([place]) Yes  . Metastatic breast cancer ([place])     SUMMARY OF ONCOLOGIC HISTORY:   Metastatic breast cancer ([place])   06/05/2006 Initial Diagnosis    Left breast biopsy: DCIS with apocrine features ER 30%, PR 13%      06/19/2006 Surgery    Left lumpectomy: stage I, pT1a, pN0(i)(sn), pMX, 0.3 cm IDC, grade 1, with extensive high-grade DCIS , margins negative ER 38 %, PR 93 %, Ki-67 10%, HER-2/neu 2+, no additional tissue available for FISH testing, with 0/1 left axillary lymph nodes.      07/30/2006 - 09/17/2006 Radiation Therapy    Adjuvant radiation therapy      09/24/2006 - 09/25/2011 Anti-estrogen oral therapy    Antiestrogen therapy with Arimidex 5 years      04/26/2016 Relapse/Recurrence    Innumerable hepatic lesions seen in both lobes most are 1-2 cm size somewhat confluent largest in the posterior right hepatic dome 3 x 4.6 cm      05/07/2016 PET scan    PET/CT scan: Extensive metastatic involvement of both lobes of the liver, primary could be liver, phalangeal or metastatic disease, hypermetabolic thoracic LN left axillary, left supraclavicular and right CP angle LN, hypermetabolic upper abdominal LN      05/16/2016 Initial Biopsy    Liver biopsy: Metastatic carcinoma breast primary strong positivity for CK 7 weak focal positivity for ER, GCDFP, negative for PR, CDX 2, TTF-1, WT 1; HER-2 positive      06/13/2016 -  Chemotherapy    Taxotere Herceptin and Perjeta every 3 weeks (Taxotere was held for elevated LFTs for cycles 1 and 2)        CHIEF COMPLIANT: Cycle 1 day 9 Herceptin and Perjeta  INTERVAL HISTORY: Monique Figueroa is a 82 year old with above-mentioned history metastatic breast cancer who received first cycle of treatment. She did only receive Herceptin and Perjeta because her LFTs were elevated. During the  Herceptin infusion she had rigors. Perjeta also caused her diarrhea. She was not taking Imodium as prescribed. Last night she took Imodium and had the best night without any diarrhea.  REVIEW OF SYSTEMS:   Constitutional: Denies fevers, chills or abnormal weight loss Eyes: Denies blurriness of vision Ears, nose, mouth, throat, and face: Denies mucositis or sore throat Respiratory: Denies cough, dyspnea or wheezes Cardiovascular: Denies palpitation, chest discomfort Gastrointestinal:  Diarrhea, nausea Skin: Denies abnormal skin rashes Lymphatics: Denies new lymphadenopathy or easy bruising Neurological:Denies numbness, tingling or new weaknesses Behavioral/Psych: Mood is stable, no new changes  Extremities: No lower extremity edema  All other systems were reviewed with the patient and are negative.  I have reviewed the past medical history, past surgical history, social history and family history with the patient and they are unchanged from previous note.  ALLERGIES:  is allergic to sulfa antibiotics.  MEDICATIONS:  Current Outpatient Prescriptions  Medication Sig Dispense Refill  . hydrochlorothiazide (HYDRODIURIL) 25 MG tablet Take 25 mg by mouth daily.     Marland Kitchen HYDROcodone-acetaminophen (NORCO/VICODIN) 5-325 MG tablet Take 1-2 tablets by mouth every 4 (four) hours as needed for moderate pain or severe pain. 40 tablet 0  . letrozole (FEMARA) 2.5 MG tablet Take 1 tablet (2.5 mg total) by mouth daily. 90 tablet 3  . levothyroxine (SYNTHROID, LEVOTHROID) 125 MCG tablet Take 125 mcg by mouth daily before breakfast.     .  lidocaine-prilocaine (EMLA) cream Apply to affected area once 30 g 3  . loratadine (CLARITIN) 10 MG tablet Take 10 mg by mouth daily as needed for allergies.    . metFORMIN (GLUCOPHAGE) 500 MG tablet Take 500 mg by mouth daily with breakfast.     . Multiple Vitamins-Minerals (MULTIVITAMIN WITH MINERALS) tablet Take 1 tablet by mouth daily.    Marland Kitchen omeprazole (PRILOSEC OTC) 20  MG tablet Take 20 mg by mouth daily.    . ondansetron (ZOFRAN) 8 MG tablet Take 1 tablet (8 mg total) by mouth 2 (two) times daily as needed for refractory nausea / vomiting. 30 tablet 1  . prochlorperazine (COMPAZINE) 10 MG tablet Take 1 tablet (10 mg total) by mouth every 6 (six) hours as needed (Nausea or vomiting). 30 tablet 1  . ranitidine (ZANTAC) 150 MG tablet Take 150 mg by mouth at bedtime.    . vitamin B-12 (CYANOCOBALAMIN) 500 MCG tablet Take 500 mcg by mouth daily as needed.    . vitamin C (ASCORBIC ACID) 500 MG tablet Take 500 mg by mouth daily.     No current facility-administered medications for this visit.     PHYSICAL EXAMINATION: ECOG PERFORMANCE STATUS: 1 - Symptomatic but completely ambulatory  Vitals:   06/21/16 0824  BP: (!) 158/68  Pulse: 85  Resp: 18  Temp: 97.9 F (36.6 C)   Filed Weights   06/21/16 0824  Weight: 166 lb 6.4 oz (75.5 kg)    GENERAL:alert, no distress and comfortable SKIN: skin color, texture, turgor are normal, no rashes or significant lesions EYES: normal, Conjunctiva are pink and non-injected, sclera clear OROPHARYNX:no exudate, no erythema and lips, buccal mucosa, and tongue normal  NECK: supple, thyroid normal size, non-tender, without nodularity LYMPH:  no palpable lymphadenopathy in the cervical, axillary or inguinal LUNGS: clear to auscultation and percussion with normal breathing effort HEART: regular rate & rhythm and no murmurs and no lower extremity edema ABDOMEN:abdomen soft, non-tender and normal bowel sounds MUSCULOSKELETAL:no cyanosis of digits and no clubbing  NEURO: alert & oriented x 3 with fluent speech, no focal motor/sensory deficits EXTREMITIES: No lower extremity edema  LABORATORY DATA:  I have reviewed the data as listed   Chemistry      Component Value Date/Time   NA 141 06/21/2016 0810   K 4.5 06/21/2016 0810   CL 105 05/16/2016 1254   CL 104 06/09/2012 1402   CO2 25 06/21/2016 0810   BUN 12.7  06/21/2016 0810   CREATININE 0.8 06/21/2016 0810      Component Value Date/Time   CALCIUM 9.3 06/21/2016 0810   ALKPHOS 185 (H) 06/21/2016 0810   AST 80 (H) 06/21/2016 0810   ALT 29 06/21/2016 0810   BILITOT 1.34 (H) 06/21/2016 0810       Lab Results  Component Value Date   WBC 5.5 06/21/2016   HGB 14.3 06/21/2016   HCT 43.0 06/21/2016   MCV 96.4 06/21/2016   PLT 215 06/21/2016   NEUTROABS 3.4 06/21/2016     ASSESSMENT & PLAN:  Metastatic breast cancer ([place]) Metastatic breast cancer with innumerable liver metastases detected on ultrasound and recent MRI of the liver on 04/26/2016 (Prior history of left breast IDC T1 1 N0 stage IA 0.3 cm grade 1 tumor that was ER 38%, PR 93%, Ki-67 10%, HER-2 2+, no tissue for FISH testing, 0/1 lymph node negative, status post lumpectomy radiation and 5 years of Arimidex completed in February 2013)  PET/CT scan: 05/07/2016: Extensive metastatic involvement of  both lobes of the liver, primary could be liver, phalangeal or metastatic disease, hypermetabolic thoracic lymph nodes left axillary, left supraclavicular and right CP angle lymph nodes, hypermetabolic upper abdominal lymph nodes  Liver biopsy 05/16/2016: Metastatic carcinoma breast primary strong positivity for CK 7 weak focal positivity for ER, GCDFP, negative for PR, CDX 2, TTF-1, WT 1; HER-2 positive  Treatment plan: 1. Taxotere Herceptin and Perjeta every 3 weeks palliative chemotherapy followed by Herceptin and Perjeta maintenance after 6 cycles started 06/13/16 ----------------------------------------------------------------------------------------------------------------------------------------- Current Treatment: Cycle 1 day 8 Taxotere Herceptin Peerjeta Chemotherapy Toxicities:  1. Diarrhea due to Perjeta: I gave her instructions on how to take Imodium once again. 2. Elevated LFTs: They are coming down. We have decided to hold Taxotere for cycles 1 and 2. 3. Nausea after first  treatment with Herceptin and Perjeta: I suspect it is related to anxiety.  Goals of treatment: Palliation of symptoms and prolongation of life Return to clinic in 2 weeks for cycle 2.  No orders of the defined types were placed in this encounter.  The patient has a good understanding of the overall plan. she agrees with it. she will call with any problems that may develop before the next visit here.   Rulon Eisenmenger, MD 06/21/16

## 2016-06-25 ENCOUNTER — Encounter: Payer: Medicare Other | Admitting: Nurse Practitioner

## 2016-06-28 ENCOUNTER — Encounter: Payer: Medicare Other | Admitting: Adult Health

## 2016-07-03 NOTE — Assessment & Plan Note (Signed)
Metastatic breast cancer with innumerable liver metastases detected on ultrasound and recent MRI of the liver on 04/26/2016 (Prior history of left breast IDC T1 1 N0 stage IA 0.3 cm grade 1 tumor that was ER 38%, PR 93%, Ki-67 10%, HER-2 2+, no tissue for FISH testing, 0/1 lymph node negative, status post lumpectomy radiation and 5 years of Arimidex completed in February 2013)  PET/CT scan: 05/07/2016: Extensive metastatic involvement of both lobes of the liver, primary could be liver, phalangeal or metastatic disease, hypermetabolic thoracic lymph nodes left axillary, left supraclavicular and right CP angle lymph nodes, hypermetabolic upper abdominal lymph nodes  Liver biopsy 05/16/2016: Metastatic carcinoma breast primary strong positivity for CK 7 weak focal positivity for ER, GCDFP, negative for PR, CDX 2, TTF-1, WT 1; HER-2 positive  Treatment plan: 1. Taxotere Herceptin and Perjeta every 3 weeks palliative chemotherapy followed by Herceptin and Perjeta maintenance after 6 cycles started 06/13/16 ----------------------------------------------------------------------------------------------------------------------------------------- Current Treatment: Cycle 2 day 1 Taxotere Herceptin Peerjeta Chemotherapy Toxicities:  1. Diarrhea due to Perjeta: I gave her instructions on how to take Imodium once again. 2. Elevated LFTs: They are coming down. We have decided to hold Taxotere for cycles 1 and 2. 3. Nausea after first treatment with Herceptin and Perjeta: I suspect it is related to anxiety.  Goals of treatment: Palliation of symptoms and prolongation of life Return to clinic in 3 weeks for cycle 3.

## 2016-07-04 ENCOUNTER — Ambulatory Visit (HOSPITAL_BASED_OUTPATIENT_CLINIC_OR_DEPARTMENT_OTHER): Payer: Medicare Other | Admitting: Hematology and Oncology

## 2016-07-04 ENCOUNTER — Ambulatory Visit (HOSPITAL_BASED_OUTPATIENT_CLINIC_OR_DEPARTMENT_OTHER): Payer: Medicare Other

## 2016-07-04 ENCOUNTER — Encounter: Payer: Self-pay | Admitting: Hematology and Oncology

## 2016-07-04 ENCOUNTER — Other Ambulatory Visit (HOSPITAL_BASED_OUTPATIENT_CLINIC_OR_DEPARTMENT_OTHER): Payer: Medicare Other

## 2016-07-04 VITALS — BP 146/66 | HR 75 | Temp 98.1°F | Resp 16

## 2016-07-04 DIAGNOSIS — C787 Secondary malignant neoplasm of liver and intrahepatic bile duct: Secondary | ICD-10-CM | POA: Diagnosis not present

## 2016-07-04 DIAGNOSIS — C50919 Malignant neoplasm of unspecified site of unspecified female breast: Secondary | ICD-10-CM

## 2016-07-04 DIAGNOSIS — R7989 Other specified abnormal findings of blood chemistry: Secondary | ICD-10-CM | POA: Diagnosis not present

## 2016-07-04 DIAGNOSIS — C778 Secondary and unspecified malignant neoplasm of lymph nodes of multiple regions: Secondary | ICD-10-CM

## 2016-07-04 DIAGNOSIS — Z5112 Encounter for antineoplastic immunotherapy: Secondary | ICD-10-CM

## 2016-07-04 LAB — COMPREHENSIVE METABOLIC PANEL
ALT: 19 U/L (ref 0–55)
ANION GAP: 9 meq/L (ref 3–11)
AST: 122 U/L — ABNORMAL HIGH (ref 5–34)
Albumin: 3 g/dL — ABNORMAL LOW (ref 3.5–5.0)
Alkaline Phosphatase: 150 U/L (ref 40–150)
BUN: 11.9 mg/dL (ref 7.0–26.0)
CALCIUM: 9.4 mg/dL (ref 8.4–10.4)
CHLORIDE: 106 meq/L (ref 98–109)
CO2: 26 meq/L (ref 22–29)
Creatinine: 0.9 mg/dL (ref 0.6–1.1)
EGFR: 63 mL/min/{1.73_m2} — AB (ref >90)
Glucose: 114 mg/dl (ref 70–140)
POTASSIUM: 4.9 meq/L (ref 3.5–5.1)
Sodium: 141 mEq/L (ref 136–145)
Total Bilirubin: 1 mg/dL (ref 0.20–1.20)
Total Protein: 7 g/dL (ref 6.4–8.3)

## 2016-07-04 LAB — CBC WITH DIFFERENTIAL/PLATELET
BASO%: 1.3 % (ref 0.0–2.0)
BASOS ABS: 0.1 10*3/uL (ref 0.0–0.1)
EOS%: 2.2 % (ref 0.0–7.0)
Eosinophils Absolute: 0.1 10*3/uL (ref 0.0–0.5)
HEMATOCRIT: 40.8 % (ref 34.8–46.6)
HGB: 13.3 g/dL (ref 11.6–15.9)
LYMPH#: 1 10*3/uL (ref 0.9–3.3)
LYMPH%: 21.6 % (ref 14.0–49.7)
MCH: 31.5 pg (ref 25.1–34.0)
MCHC: 32.6 g/dL (ref 31.5–36.0)
MCV: 96.7 fL (ref 79.5–101.0)
MONO#: 0.6 10*3/uL (ref 0.1–0.9)
MONO%: 14.1 % — ABNORMAL HIGH (ref 0.0–14.0)
NEUT#: 2.7 10*3/uL (ref 1.5–6.5)
NEUT%: 60.8 % (ref 38.4–76.8)
PLATELETS: 190 10*3/uL (ref 145–400)
RBC: 4.22 10*6/uL (ref 3.70–5.45)
RDW: 15 % — ABNORMAL HIGH (ref 11.2–14.5)
WBC: 4.4 10*3/uL (ref 3.9–10.3)

## 2016-07-04 MED ORDER — TRASTUZUMAB CHEMO 150 MG IV SOLR
6.0000 mg/kg | Freq: Once | INTRAVENOUS | Status: AC
  Administered 2016-07-04: 462 mg via INTRAVENOUS
  Filled 2016-07-04: qty 22

## 2016-07-04 MED ORDER — SODIUM CHLORIDE 0.9 % IV SOLN
420.0000 mg | Freq: Once | INTRAVENOUS | Status: AC
  Administered 2016-07-04: 420 mg via INTRAVENOUS
  Filled 2016-07-04: qty 14

## 2016-07-04 MED ORDER — SODIUM CHLORIDE 0.9% FLUSH
10.0000 mL | INTRAVENOUS | Status: DC | PRN
  Administered 2016-07-04: 10 mL
  Filled 2016-07-04: qty 10

## 2016-07-04 MED ORDER — HEPARIN SOD (PORK) LOCK FLUSH 100 UNIT/ML IV SOLN
500.0000 [IU] | Freq: Once | INTRAVENOUS | Status: AC | PRN
  Administered 2016-07-04: 500 [IU]
  Filled 2016-07-04: qty 5

## 2016-07-04 MED ORDER — DIPHENHYDRAMINE HCL 25 MG PO CAPS
ORAL_CAPSULE | ORAL | Status: AC
  Filled 2016-07-04: qty 2

## 2016-07-04 MED ORDER — ACETAMINOPHEN 325 MG PO TABS
650.0000 mg | ORAL_TABLET | Freq: Once | ORAL | Status: AC
  Administered 2016-07-04: 650 mg via ORAL

## 2016-07-04 MED ORDER — ACETAMINOPHEN 325 MG PO TABS
ORAL_TABLET | ORAL | Status: AC
  Filled 2016-07-04: qty 2

## 2016-07-04 MED ORDER — SODIUM CHLORIDE 0.9 % IV SOLN
Freq: Once | INTRAVENOUS | Status: AC
Start: 2016-07-04 — End: 2016-07-04
  Administered 2016-07-04: 11:00:00 via INTRAVENOUS

## 2016-07-04 MED ORDER — DIPHENHYDRAMINE HCL 25 MG PO CAPS
50.0000 mg | ORAL_CAPSULE | Freq: Once | ORAL | Status: AC
  Administered 2016-07-04: 50 mg via ORAL

## 2016-07-04 NOTE — Progress Notes (Signed)
Patient monitored for 30 minutes post transfusion. Patient and vital signs stable upon discharge.

## 2016-07-04 NOTE — Patient Instructions (Signed)
[facility] Discharge Instructions for Patients Receiving Chemotherapy  Today you received the following chemotherapy agents Herceptin and Perjeta   To help prevent nausea and vomiting after your treatment, we encourage you to take your nausea medication as directed.    If you develop nausea and vomiting that is not controlled by your nausea medication, call the clinic.   BELOW ARE SYMPTOMS THAT SHOULD BE REPORTED IMMEDIATELY:  *FEVER GREATER THAN 100.5 F  *CHILLS WITH OR WITHOUT FEVER  NAUSEA AND VOMITING THAT IS NOT CONTROLLED WITH YOUR NAUSEA MEDICATION  *UNUSUAL SHORTNESS OF BREATH  *UNUSUAL BRUISING OR BLEEDING  TENDERNESS IN MOUTH AND THROAT WITH OR WITHOUT PRESENCE OF ULCERS  *URINARY PROBLEMS  *BOWEL PROBLEMS  UNUSUAL RASH Items with * indicate a potential emergency and should be followed up as soon as possible.  Feel free to call the clinic you have any questions or concerns. The clinic phone number is [phone].  Please show the CHEMO ALERT CARD at check-in to the Emergency Department and triage nurse.   

## 2016-07-04 NOTE — Progress Notes (Signed)
 Patient Care Team: James Kim, MD as PCP - General (Internal Medicine)  DIAGNOSIS:  Encounter Diagnosis  Name Primary?  . Metastatic breast cancer ([facility])     SUMMARY OF ONCOLOGIC HISTORY:   Metastatic breast cancer ([facility])   06/05/2006 Initial Diagnosis    Left breast biopsy: DCIS with apocrine features ER 30%, PR 13%      06/19/2006 Surgery    Left lumpectomy: stage I, pT1a, pN0(i)(sn), pMX, 0.3 cm IDC, grade 1, with extensive high-grade DCIS , margins negative ER 38 %, PR 93 %, Ki-67 10%, HER-2/neu 2+, no additional tissue available for FISH testing, with 0/1 left axillary lymph nodes.      07/30/2006 - 09/17/2006 Radiation Therapy    Adjuvant radiation therapy      09/24/2006 - 09/25/2011 Anti-estrogen oral therapy    Antiestrogen therapy with Arimidex 5 years      04/26/2016 Relapse/Recurrence    Innumerable hepatic lesions seen in both lobes most are 1-2 cm size somewhat confluent largest in the posterior right hepatic dome 3 x 4.6 cm      05/07/2016 PET scan    PET/CT scan: Extensive metastatic involvement of both lobes of the liver, primary could be liver, phalangeal or metastatic disease, hypermetabolic thoracic LN left axillary, left supraclavicular and right CP angle LN, hypermetabolic upper abdominal LN      05/16/2016 Initial Biopsy    Liver biopsy: Metastatic carcinoma breast primary strong positivity for CK 7 weak focal positivity for ER, GCDFP, negative for PR, CDX 2, TTF-1, WT 1; HER-2 positive      06/13/2016 -  Chemotherapy    Taxotere Herceptin and Perjeta every 3 weeks (Taxotere was held for elevated LFTs for cycles 1 and 2)        CHIEF COMPLIANT: Cycle 2  Herceptin and Perjeta (Taxotere to be added with cycle 3)  INTERVAL HISTORY: Shaleta H Merlo is a 10-year-old with metastatic breast cancer with extensive liver metastases along with thoracic supraclavicular and upper abdominal lymphadenopathy. Patient is currently on palliative treatment with  Taxotere Herceptin and Perjeta. We decided not to administer Taxotere for the first 2 cycles because of elevated liver function tests. Liver function tests are being monitored closely. Patient had diarrhea which was resolved with Imodium. She had slight nausea. She has somewhat difficulty with eating protein. I encouraged her to eat some more and suggested some protein foods.  REVIEW OF SYSTEMS:   Constitutional: Denies fevers, chills or abnormal weight loss Eyes: Denies blurriness of vision Ears, nose, mouth, throat, and face: Denies mucositis or sore throat Respiratory: Denies cough, dyspnea or wheezes Cardiovascular: Denies palpitation, chest discomfort Gastrointestinal:  Occasional nausea and diarrhea Skin: Denies abnormal skin rashes Lymphatics: Denies new lymphadenopathy or easy bruising Neurological:Denies numbness, tingling or new weaknesses Behavioral/Psych: Mood is stable, no new changes  Extremities: No lower extremity edema All other systems were reviewed with the patient and are negative.  I have reviewed the past medical history, past surgical history, social history and family history with the patient and they are unchanged from previous note.  ALLERGIES:  is allergic to sulfa antibiotics.  MEDICATIONS:  Current Outpatient Prescriptions  Medication Sig Dispense Refill  . hydrochlorothiazide (HYDRODIURIL) 25 MG tablet Take 25 mg by mouth daily.     . HYDROcodone-acetaminophen (NORCO/VICODIN) 5-325 MG tablet Take 1-2 tablets by mouth every 4 (four) hours as needed for moderate pain or severe pain. 40 tablet 0  . letrozole (FEMARA) 2.5 MG tablet Take 1 tablet (2.5 mg   total) by mouth daily. 90 tablet 3  . levothyroxine (SYNTHROID, LEVOTHROID) 125 MCG tablet Take 125 mcg by mouth daily before breakfast.     . lidocaine-prilocaine (EMLA) cream Apply to affected area once 30 g 3  . loratadine (CLARITIN) 10 MG tablet Take 10 mg by mouth daily as needed for allergies.    .  metFORMIN (GLUCOPHAGE) 500 MG tablet Take 500 mg by mouth daily with breakfast.     . Multiple Vitamins-Minerals (MULTIVITAMIN WITH MINERALS) tablet Take 1 tablet by mouth daily.    Marland Kitchen omeprazole (PRILOSEC OTC) 20 MG tablet Take 20 mg by mouth daily.    . ondansetron (ZOFRAN) 8 MG tablet Take 1 tablet (8 mg total) by mouth 2 (two) times daily as needed for refractory nausea / vomiting. 30 tablet 1  . prochlorperazine (COMPAZINE) 10 MG tablet Take 1 tablet (10 mg total) by mouth every 6 (six) hours as needed (Nausea or vomiting). 30 tablet 1  . ranitidine (ZANTAC) 150 MG tablet Take 150 mg by mouth at bedtime.    . vitamin B-12 (CYANOCOBALAMIN) 500 MCG tablet Take 500 mcg by mouth daily as needed.    . vitamin C (ASCORBIC ACID) 500 MG tablet Take 500 mg by mouth daily.     No current facility-administered medications for this visit.     PHYSICAL EXAMINATION: ECOG PERFORMANCE STATUS: 1 - Symptomatic but completely ambulatory  Vitals:   07/04/16 0924  BP: (!) 160/77  Pulse: 79  Resp: 18  Temp: 98.2 F (36.8 C)   Filed Weights   07/04/16 0924  Weight: 167 lb 11.2 oz (76.1 kg)    GENERAL:alert, no distress and comfortable SKIN: skin color, texture, turgor are normal, no rashes or significant lesions EYES: normal, Conjunctiva are pink and non-injected, sclera clear OROPHARYNX:no exudate, no erythema and lips, buccal mucosa, and tongue normal  NECK: supple, thyroid normal size, non-tender, without nodularity LYMPH:  no palpable lymphadenopathy in the cervical, axillary or inguinal LUNGS: clear to auscultation and percussion with normal breathing effort HEART: regular rate & rhythm and no murmurs and no lower extremity edema ABDOMEN:abdomen soft, non-tender and normal bowel sounds MUSCULOSKELETAL:no cyanosis of digits and no clubbing  NEURO: alert & oriented x 3 with fluent speech, no focal motor/sensory deficits EXTREMITIES: No lower extremity edema  LABORATORY DATA:  I have  reviewed the data as listed   Chemistry      Component Value Date/Time   NA 141 07/04/2016 0904   K 4.9 07/04/2016 0904   CL 105 05/16/2016 1254   CL 104 06/09/2012 1402   CO2 26 07/04/2016 0904   BUN 11.9 07/04/2016 0904   CREATININE 0.9 07/04/2016 0904      Component Value Date/Time   CALCIUM 9.4 07/04/2016 0904   ALKPHOS 150 07/04/2016 0904   AST 122 (H) 07/04/2016 0904   ALT 19 07/04/2016 0904   BILITOT 1.00 07/04/2016 0904       Lab Results  Component Value Date   WBC 4.4 07/04/2016   HGB 13.3 07/04/2016   HCT 40.8 07/04/2016   MCV 96.7 07/04/2016   PLT 190 07/04/2016   NEUTROABS 2.7 07/04/2016    ASSESSMENT & PLAN:  Metastatic breast cancer ([facility]) Metastatic breast cancer with innumerable liver metastases detected on ultrasound and recent MRI of the liver on 04/26/2016 (Prior history of left breast IDC T1 1 N0 stage IA 0.3 cm grade 1 tumor that was ER 38%, PR 93%, Ki-67 10%, HER-2 2+, no tissue for FISH testing,  0/1 lymph node negative, status post lumpectomy radiation and 5 years of Arimidex completed in February 2013)  PET/CT scan: 05/07/2016: Extensive metastatic involvement of both lobes of the liver, primary could be liver, phalangeal or metastatic disease, hypermetabolic thoracic lymph nodes left axillary, left supraclavicular and right CP angle lymph nodes, hypermetabolic upper abdominal lymph nodes  Liver biopsy 05/16/2016: Metastatic carcinoma breast primary strong positivity for CK 7 weak focal positivity for ER, GCDFP, negative for PR, CDX 2, TTF-1, WT 1; HER-2 positive  Treatment plan: 1. Taxotere Herceptin and Perjeta every 3 weeks palliative chemotherapy followed by Herceptin and Perjeta maintenance after 6 cycles started 06/13/16 ----------------------------------------------------------------------------------------------------------------------------------------- Current Treatment: Cycle 2 day 1 Herceptin Peerjeta Chemotherapy Toxicities:  1.  Diarrhea due to Perjeta: I gave her instructions on how to take Imodium once again. 2. Elevated LFTs: They are coming down. We have decided to hold Taxotere for cycles 1 and 2. 3. Nausea after first treatment with Herceptin and Perjeta: I suspect it is related to anxiety. We will be adding Taxotere to the next cycle at a low dose. Goals of treatment: Palliation of symptoms and prolongation of life Return to clinic in 3 weeks for cycle 3, we will add Taxotere with that cycle.   No orders of the defined types were placed in this encounter.  The patient has a good understanding of the overall plan. she agrees with it. she will call with any problems that may develop before the next visit here.   Rulon Eisenmenger, MD 07/04/16

## 2016-07-24 NOTE — Assessment & Plan Note (Signed)
Metastatic breast cancer with innumerable liver metastases detected on ultrasound and recent MRI of the liver on 04/26/2016 (Prior history of left breast IDC T1 1 N0 stage IA 0.3 cm grade 1 tumor that was ER 38%, PR 93%, Ki-67 10%, HER-2 2+, no tissue for FISH testing, 0/1 lymph node negative, status post lumpectomy radiation and 5 years of Arimidex completed in February 2013)  PET/CT scan: 05/07/2016: Extensive metastatic involvement of both lobes of the liver, primary could be liver, phalangeal or metastatic disease, hypermetabolic thoracic lymph nodes left axillary, left supraclavicular and right CP angle lymph nodes, hypermetabolic upper abdominal lymph nodes  Liver biopsy10/07/2016: Metastatic carcinoma breast primary strong positivity for CK 7 weak focal positivity for ER, GCDFP, negative for PR, CDX 2, TTF-1, WT 1; HER-2 positive  Treatment plan: 1. Taxotere Herceptin and Perjeta every 3 weeks palliative chemotherapy followed by Herceptin and Perjeta maintenance after 6 cycles started 06/13/16 ----------------------------------------------------------------------------------------------------------------------------------------- Current Treatment: Cycle 2 day 1 Herceptin Perjeta, adding Taxotere with cycle 3  Chemotherapy Toxicities:  1. Diarrhea due to Perjeta: I gave her instructions on how to take Imodium once again. 2. Elevated LFTs: They are coming down. We have decided to hold Taxotere forcycles 1 and 2. 3. Nausea after first treatment with Herceptin and Perjeta: I suspect it is related to anxiety. We will be adding Taxotere to the next cycle at a low dose. Goals of treatment: Palliation of symptoms and prolongation of life Return to clinic in 3 weeks for cycle 3, we will add Taxotere with that cycle.

## 2016-07-25 ENCOUNTER — Other Ambulatory Visit (HOSPITAL_BASED_OUTPATIENT_CLINIC_OR_DEPARTMENT_OTHER): Payer: Medicare Other

## 2016-07-25 ENCOUNTER — Encounter: Payer: Self-pay | Admitting: Hematology and Oncology

## 2016-07-25 ENCOUNTER — Ambulatory Visit: Payer: Medicare Other | Admitting: Hematology and Oncology

## 2016-07-25 ENCOUNTER — Ambulatory Visit (HOSPITAL_BASED_OUTPATIENT_CLINIC_OR_DEPARTMENT_OTHER): Payer: Medicare Other

## 2016-07-25 VITALS — BP 150/80 | HR 84 | Temp 98.2°F | Resp 18

## 2016-07-25 DIAGNOSIS — R7989 Other specified abnormal findings of blood chemistry: Secondary | ICD-10-CM

## 2016-07-25 DIAGNOSIS — Z5112 Encounter for antineoplastic immunotherapy: Secondary | ICD-10-CM | POA: Diagnosis not present

## 2016-07-25 DIAGNOSIS — C787 Secondary malignant neoplasm of liver and intrahepatic bile duct: Secondary | ICD-10-CM

## 2016-07-25 DIAGNOSIS — Z5111 Encounter for antineoplastic chemotherapy: Secondary | ICD-10-CM

## 2016-07-25 DIAGNOSIS — R197 Diarrhea, unspecified: Secondary | ICD-10-CM | POA: Diagnosis not present

## 2016-07-25 DIAGNOSIS — C50919 Malignant neoplasm of unspecified site of unspecified female breast: Secondary | ICD-10-CM

## 2016-07-25 LAB — COMPREHENSIVE METABOLIC PANEL
ALT: 22 U/L (ref 0–55)
ANION GAP: 10 meq/L (ref 3–11)
AST: 106 U/L — ABNORMAL HIGH (ref 5–34)
Albumin: 3.2 g/dL — ABNORMAL LOW (ref 3.5–5.0)
Alkaline Phosphatase: 118 U/L (ref 40–150)
BILIRUBIN TOTAL: 0.89 mg/dL (ref 0.20–1.20)
BUN: 12.7 mg/dL (ref 7.0–26.0)
CHLORIDE: 103 meq/L (ref 98–109)
CO2: 26 meq/L (ref 22–29)
Calcium: 9.2 mg/dL (ref 8.4–10.4)
Creatinine: 1 mg/dL (ref 0.6–1.1)
EGFR: 50 mL/min/{1.73_m2} — AB (ref >90)
GLUCOSE: 122 mg/dL (ref 70–140)
Potassium: 4.3 mEq/L (ref 3.5–5.1)
SODIUM: 139 meq/L (ref 136–145)
TOTAL PROTEIN: 7.2 g/dL (ref 6.4–8.3)

## 2016-07-25 LAB — CBC WITH DIFFERENTIAL/PLATELET
BASO%: 0.9 % (ref 0.0–2.0)
Basophils Absolute: 0 10*3/uL (ref 0.0–0.1)
EOS%: 1.8 % (ref 0.0–7.0)
Eosinophils Absolute: 0.1 10*3/uL (ref 0.0–0.5)
HCT: 38.4 % (ref 34.8–46.6)
HGB: 13 g/dL (ref 11.6–15.9)
LYMPH%: 24.4 % (ref 14.0–49.7)
MCH: 32.1 pg (ref 25.1–34.0)
MCHC: 33.9 g/dL (ref 31.5–36.0)
MCV: 94.8 fL (ref 79.5–101.0)
MONO#: 0.7 10*3/uL (ref 0.1–0.9)
MONO%: 15 % — AB (ref 0.0–14.0)
NEUT%: 57.9 % (ref 38.4–76.8)
NEUTROS ABS: 2.6 10*3/uL (ref 1.5–6.5)
Platelets: 180 10*3/uL (ref 145–400)
RBC: 4.05 10*6/uL (ref 3.70–5.45)
RDW: 14.7 % — ABNORMAL HIGH (ref 11.2–14.5)
WBC: 4.5 10*3/uL (ref 3.9–10.3)
lymph#: 1.1 10*3/uL (ref 0.9–3.3)

## 2016-07-25 MED ORDER — DEXAMETHASONE SODIUM PHOSPHATE 10 MG/ML IJ SOLN
2.0000 mg | Freq: Once | INTRAMUSCULAR | Status: AC
  Administered 2016-07-25: 14:00:00 via INTRAVENOUS

## 2016-07-25 MED ORDER — DEXAMETHASONE SODIUM PHOSPHATE 10 MG/ML IJ SOLN
INTRAMUSCULAR | Status: AC
  Filled 2016-07-25: qty 1

## 2016-07-25 MED ORDER — DIPHENHYDRAMINE HCL 25 MG PO CAPS
ORAL_CAPSULE | ORAL | Status: AC
  Filled 2016-07-25: qty 2

## 2016-07-25 MED ORDER — TRASTUZUMAB CHEMO 150 MG IV SOLR
6.0000 mg/kg | Freq: Once | INTRAVENOUS | Status: AC
  Administered 2016-07-25: 462 mg via INTRAVENOUS
  Filled 2016-07-25: qty 22

## 2016-07-25 MED ORDER — DIPHENHYDRAMINE HCL 50 MG/ML IJ SOLN
25.0000 mg | Freq: Once | INTRAMUSCULAR | Status: AC | PRN
  Administered 2016-07-25: 25 mg via INTRAVENOUS

## 2016-07-25 MED ORDER — SODIUM CHLORIDE 0.9 % IV SOLN
Freq: Once | INTRAVENOUS | Status: AC
  Administered 2016-07-25: 11:00:00 via INTRAVENOUS

## 2016-07-25 MED ORDER — PALONOSETRON HCL INJECTION 0.25 MG/5ML
0.2500 mg | Freq: Once | INTRAVENOUS | Status: AC
  Administered 2016-07-25: 0.25 mg via INTRAVENOUS

## 2016-07-25 MED ORDER — SODIUM CHLORIDE 0.9 % IV SOLN
25.0000 mg/m2 | Freq: Once | INTRAVENOUS | Status: AC
  Administered 2016-07-25: 50 mg via INTRAVENOUS
  Filled 2016-07-25: qty 5

## 2016-07-25 MED ORDER — ACETAMINOPHEN 325 MG PO TABS
ORAL_TABLET | ORAL | Status: AC
  Filled 2016-07-25: qty 2

## 2016-07-25 MED ORDER — METHYLPREDNISOLONE SODIUM SUCC 125 MG IJ SOLR
125.0000 mg | Freq: Once | INTRAMUSCULAR | Status: AC | PRN
  Administered 2016-07-25: 125 mg via INTRAVENOUS

## 2016-07-25 MED ORDER — PALONOSETRON HCL INJECTION 0.25 MG/5ML
INTRAVENOUS | Status: AC
  Filled 2016-07-25: qty 5

## 2016-07-25 MED ORDER — DIPHENHYDRAMINE HCL 25 MG PO CAPS
50.0000 mg | ORAL_CAPSULE | Freq: Once | ORAL | Status: AC
  Administered 2016-07-25: 50 mg via ORAL

## 2016-07-25 MED ORDER — SODIUM CHLORIDE 0.9% FLUSH
10.0000 mL | INTRAVENOUS | Status: DC | PRN
  Administered 2016-07-25: 10 mL
  Filled 2016-07-25: qty 10

## 2016-07-25 MED ORDER — SODIUM CHLORIDE 0.9 % IV SOLN
420.0000 mg | Freq: Once | INTRAVENOUS | Status: AC
  Administered 2016-07-25: 420 mg via INTRAVENOUS
  Filled 2016-07-25: qty 14

## 2016-07-25 MED ORDER — SODIUM CHLORIDE 0.9 % IV SOLN: 2.0000 mg | Freq: Once | INTRAVENOUS | Status: DC

## 2016-07-25 MED ORDER — SODIUM CHLORIDE 0.9 % IV SOLN
Freq: Once | INTRAVENOUS | Status: AC | PRN
  Administered 2016-07-25: 12:00:00 via INTRAVENOUS

## 2016-07-25 MED ORDER — SODIUM CHLORIDE 0.9 % IV SOLN: Freq: Once | INTRAVENOUS | Status: AC

## 2016-07-25 MED ORDER — FAMOTIDINE IN NACL 20-0.9 MG/50ML-% IV SOLN
20.0000 mg | Freq: Once | INTRAVENOUS | Status: AC | PRN
  Administered 2016-07-25: 20 mg via INTRAVENOUS

## 2016-07-25 MED ORDER — HEPARIN SOD (PORK) LOCK FLUSH 100 UNIT/ML IV SOLN
500.0000 [IU] | Freq: Once | INTRAVENOUS | Status: AC | PRN
  Administered 2016-07-25: 500 [IU]
  Filled 2016-07-25: qty 5

## 2016-07-25 MED ORDER — ACETAMINOPHEN 325 MG PO TABS
650.0000 mg | ORAL_TABLET | Freq: Once | ORAL | Status: AC
  Administered 2016-07-25: 650 mg via ORAL

## 2016-07-25 NOTE — Progress Notes (Signed)
Ok to treat with herceptin/perjeta and taxotere with ast 107 per Dr.Gudena.

## 2016-07-25 NOTE — Progress Notes (Signed)
Dr. [name] okay to tx pt today with AST 106.  Pt began having reaction at 1220. Pt stated later that she began feeling like she may be having a "vertigo attack." She continued, "My head and face got hot and I began to get short of breath." At the time of reaction pt face, neck, and chest were red. RR increased and pt seemed to be having increased difficulty breathing.   1221 - 6L [place] added. Pt encouraged to take slow, deep breaths. IVF started.   1223 - 25mg  benadryl given. HR 117, RR 24, BP 164/88, SpO2 99%. [place] reduced to 4L.   1224 - 125mg  solu-medrol given.  1225 - 20mg  pepcid given.  1228 - T 97.65F, HR 108, BP 167/78, SpO2 100%. [place] reduced to 2L.   [place], NP and Dr. [name] at chairside. If pt continues to improve will resume perjeta infusion. Pt face pink, breathing improved, and able to communicate. HR 108, RR 24, BP 161/72, SpO2 98%. [place] removed.   1236 - HR 95, RR 24, BP 155/71, SpO2 96%.  1240 - IVF slowed to 3110mL/hr.  1248 - HR 87, BP 157/70, SpO2 94%. IVF rate change 11mL/hr.   1256 - Selena Lesser, NP at chairside. Okay to restart perjeta at prescribed rate.   1300 -Perjeta restarted at initial rate per Selena Lesser, NP.   1325 - Perjeta finished. No further reactions or complaints made by pt.  Pt receiving first-time taxotere today. Discussed potential and common side effects of taxotere and the process of titration today. Pt was asymptomatic and VSS during taxotere infusion. In-basket placed for f/u phone call tomorrow. AVS printed with taxotere information and reinforced what nausea medications to take and when. Pt and son verbalized understanding. There are no questions at this time.

## 2016-07-25 NOTE — Patient Instructions (Addendum)
[facility] Discharge Instructions for Patients Receiving Chemotherapy  Today you received the following chemotherapy agents: Herceptin, Perjeta, and Taxotere.  To help prevent nausea and vomiting after your treatment, we encourage you to take your nausea medication as directed.    If you develop nausea and vomiting that is not controlled by your nausea medication, call the clinic.   BELOW ARE SYMPTOMS THAT SHOULD BE REPORTED IMMEDIATELY:  *FEVER GREATER THAN 100.5 F  *CHILLS WITH OR WITHOUT FEVER  NAUSEA AND VOMITING THAT IS NOT CONTROLLED WITH YOUR NAUSEA MEDICATION  *UNUSUAL SHORTNESS OF BREATH  *UNUSUAL BRUISING OR BLEEDING  TENDERNESS IN MOUTH AND THROAT WITH OR WITHOUT PRESENCE OF ULCERS  *URINARY PROBLEMS  *BOWEL PROBLEMS  UNUSUAL RASH Items with * indicate a potential emergency and should be followed up as soon as possible.  Feel free to call the clinic you have any questions or concerns. The clinic phone number is [phone].  Please show the [facility] at check-in to the Emergency Department and triage nurse.  Docetaxel injection What is this medicine? DOCETAXEL (doe se TAX el) is a chemotherapy drug. It targets fast dividing cells, like cancer cells, and causes these cells to die. This medicine is used to treat many types of cancers like breast cancer, certain stomach cancers, head and neck cancer, lung cancer, and prostate cancer. This medicine may be used for other purposes; ask your health care provider or pharmacist if you have questions. COMMON BRAND NAME(S): Docefrez, Taxotere What should I tell my health care provider before I take this medicine? They need to know if you have any of these conditions: -infection (especially a virus infection such as chickenpox, cold sores, or herpes) -liver disease -low blood counts, like low white cell, platelet, or red cell counts -an unusual or allergic reaction to docetaxel, polysorbate 80,  other chemotherapy agents, other medicines, foods, dyes, or preservatives -pregnant or trying to get pregnant -breast-feeding How should I use this medicine? This drug is given as an infusion into a vein. It is administered in a hospital or clinic by a specially trained health care professional. Talk to your pediatrician regarding the use of this medicine in children. Special care may be needed. Overdosage: If you think you have taken too much of this medicine contact a poison control center or emergency room at once. NOTE: This medicine is only for you. Do not share this medicine with others. What if I miss a dose? It is important not to miss your dose. Call your doctor or health care professional if you are unable to keep an appointment. What may interact with this medicine? -cyclosporine -erythromycin -ketoconazole -medicines to increase blood counts like filgrastim, pegfilgrastim, sargramostim -vaccines Talk to your doctor or health care professional before taking any of these medicines: -acetaminophen -aspirin -ibuprofen -ketoprofen -naproxen This list may not describe all possible interactions. Give your health care provider a list of all the medicines, herbs, non-prescription drugs, or dietary supplements you use. Also tell them if you smoke, drink alcohol, or use illegal drugs. Some items may interact with your medicine. What should I watch for while using this medicine? Your condition will be monitored carefully while you are receiving this medicine. You will need important blood work done while you are taking this medicine. This drug may make you feel generally unwell. This is not uncommon, as chemotherapy can affect healthy cells as well as cancer cells. Report any side effects. Continue your course of treatment even though you  feel ill unless your doctor tells you to stop. In some cases, you may be given additional medicines to help with side effects. Follow all directions for  their use. Call your doctor or health care professional for advice if you get a fever, chills or sore throat, or other symptoms of a cold or flu. Do not treat yourself. This drug decreases your body's ability to fight infections. Try to avoid being around people who are sick. This medicine may increase your risk to bruise or bleed. Call your doctor or health care professional if you notice any unusual bleeding. This medicine may contain alcohol in the product. You may get drowsy or dizzy. Do not drive, use machinery, or do anything that needs mental alertness until you know how this medicine affects you. Do not stand or sit up quickly, especially if you are an older patient. This reduces the risk of dizzy or fainting spells. Avoid alcoholic drinks. Do not become pregnant while taking this medicine. Women should inform their doctor if they wish to become pregnant or think they might be pregnant. There is a potential for serious side effects to an unborn child. Talk to your health care professional or pharmacist for more information. Do not breast-feed an infant while taking this medicine. What side effects may I notice from receiving this medicine? Side effects that you should report to your doctor or health care professional as soon as possible: -allergic reactions like skin rash, itching or hives, swelling of the face, lips, or tongue -low blood counts - This drug may decrease the number of white blood cells, red blood cells and platelets. You may be at increased risk for infections and bleeding. -signs of infection - fever or chills, cough, sore throat, pain or difficulty passing urine -signs of decreased platelets or bleeding - bruising, pinpoint red spots on the skin, black, tarry stools, nosebleeds -signs of decreased red blood cells - unusually weak or tired, fainting spells, lightheadedness -breathing problems -fast or irregular heartbeat -low blood pressure -mouth sores -nausea and  vomiting -pain, swelling, redness or irritation at the injection site -pain, tingling, numbness in the hands or feet -swelling of the ankle, feet, hands -weight gain Side effects that usually do not require medical attention (report to your doctor or health care professional if they continue or are bothersome): -bone pain -complete hair loss including hair on your head, underarms, pubic hair, eyebrows, and eyelashes -diarrhea -excessive tearing -changes in the color of fingernails -loosening of the fingernails -nausea -muscle pain -red flush to skin -sweating -weak or tired This list may not describe all possible side effects. Call your doctor for medical advice about side effects. You may report side effects to FDA at 1-800-FDA-1088. Where should I keep my medicine? This drug is given in a hospital or clinic and will not be stored at home. NOTE: This sheet is a summary. It may not cover all possible information. If you have questions about this medicine, talk to your doctor, pharmacist, or health care provider.  2017 Elsevier/Gold Standard (2015-08-24 12:32:56)

## 2016-07-25 NOTE — Progress Notes (Signed)
Patient Care Team: [name], MD as PCP - General (Internal Medicine)  DIAGNOSIS:  Encounter Diagnosis  Name Primary?  . Metastatic breast cancer ([facility])     SUMMARY OF ONCOLOGIC HISTORY:   Metastatic breast cancer ([facility])   06/05/2006 Initial Diagnosis    Left breast biopsy: DCIS with apocrine features ER 30%, PR 13%      06/19/2006 Surgery    Left lumpectomy: stage I, pT1a, pN0(i)(sn), pMX, 0.3 cm IDC, grade 1, with extensive high-grade DCIS , margins negative ER 38 %, PR 93 %, Ki-67 10%, HER-2/neu 2+, no additional tissue available for FISH testing, with 0/1 left axillary lymph nodes.      07/30/2006 - 09/17/2006 Radiation Therapy    Adjuvant radiation therapy      09/24/2006 - 09/25/2011 Anti-estrogen oral therapy    Antiestrogen therapy with Arimidex 5 years      04/26/2016 Relapse/Recurrence    Innumerable hepatic lesions seen in both lobes most are 1-2 cm size somewhat confluent largest in the posterior right hepatic dome 3 x 4.6 cm      05/07/2016 PET scan    PET/CT scan: Extensive metastatic involvement of both lobes of the liver, primary could be liver, phalangeal or metastatic disease, hypermetabolic thoracic LN left axillary, left supraclavicular and right CP angle LN, hypermetabolic upper abdominal LN      05/16/2016 Initial Biopsy    Liver biopsy: Metastatic carcinoma breast primary strong positivity for CK 7 weak focal positivity for ER, GCDFP, negative for PR, CDX 2, TTF-1, WT 1; HER-2 positive      06/13/2016 -  Chemotherapy    Taxotere Herceptin and Perjeta every 3 weeks (Taxotere was held for elevated LFTs for cycles 1 and 2)        CHIEF COMPLIANT: Cycle 3 Herceptin and Perjeta, Taxotere to be added today  INTERVAL HISTORY: Monique Figueroa is a 82 year old with above-mentioned history of metastatic breast cancer currently on palliative chemotherapy. Today we are adding Taxotere to her Herceptin and Perjeta. We are starting her to very low dose. With  Herceptin and Perjeta she had improvement in the liver function tests. She also had diarrhea which was improved with Imodium.  REVIEW OF SYSTEMS:   Constitutional: Denies fevers, chills or abnormal weight loss Eyes: Denies blurriness of vision Ears, nose, mouth, throat, and face: Denies mucositis or sore throat Respiratory: Denies cough, dyspnea or wheezes Cardiovascular: Denies palpitation, chest discomfort Gastrointestinal:  Denies nausea, heartburn or change in bowel habits Skin: Denies abnormal skin rashes Lymphatics: Denies new lymphadenopathy or easy bruising Neurological:Denies numbness, tingling or new weaknesses Behavioral/Psych: Mood is stable, no new changes  Extremities: No lower extremity edema  All other systems were reviewed with the patient and are negative.  I have reviewed the past medical history, past surgical history, social history and family history with the patient and they are unchanged from previous note.  ALLERGIES:  is allergic to sulfa antibiotics.  MEDICATIONS:  Current Outpatient Prescriptions  Medication Sig Dispense Refill  . hydrochlorothiazide (HYDRODIURIL) 25 MG tablet Take 25 mg by mouth daily.     Marland Kitchen HYDROcodone-acetaminophen (NORCO/VICODIN) 5-325 MG tablet Take 1-2 tablets by mouth every 4 (four) hours as needed for moderate pain or severe pain. 40 tablet 0  . letrozole (FEMARA) 2.5 MG tablet Take 1 tablet (2.5 mg total) by mouth daily. 90 tablet 3  . levothyroxine (SYNTHROID, LEVOTHROID) 125 MCG tablet Take 125 mcg by mouth daily before breakfast.     . lidocaine-prilocaine (EMLA)  cream Apply to affected area once 30 g 3  . loratadine (CLARITIN) 10 MG tablet Take 10 mg by mouth daily as needed for allergies.    . metFORMIN (GLUCOPHAGE) 500 MG tablet Take 500 mg by mouth daily with breakfast.     . Multiple Vitamins-Minerals (MULTIVITAMIN WITH MINERALS) tablet Take 1 tablet by mouth daily.    Marland Kitchen omeprazole (PRILOSEC OTC) 20 MG tablet Take 20 mg by  mouth daily.    . ondansetron (ZOFRAN) 8 MG tablet Take 1 tablet (8 mg total) by mouth 2 (two) times daily as needed for refractory nausea / vomiting. 30 tablet 1  . prochlorperazine (COMPAZINE) 10 MG tablet Take 1 tablet (10 mg total) by mouth every 6 (six) hours as needed (Nausea or vomiting). 30 tablet 1  . ranitidine (ZANTAC) 150 MG tablet Take 150 mg by mouth at bedtime.    . vitamin B-12 (CYANOCOBALAMIN) 500 MCG tablet Take 500 mcg by mouth daily as needed.    . vitamin C (ASCORBIC ACID) 500 MG tablet Take 500 mg by mouth daily.     No current facility-administered medications for this visit.     PHYSICAL EXAMINATION: ECOG PERFORMANCE STATUS: 1 - Symptomatic but completely ambulatory  Vitals:   07/25/16 0951  BP: (!) 151/76  Pulse: 88  Resp: 17  Temp: 97.8 F (36.6 C)   Filed Weights   07/25/16 0951  Weight: 163 lb 3.2 oz (74 kg)    GENERAL:alert, no distress and comfortable SKIN: skin color, texture, turgor are normal, no rashes or significant lesions EYES: normal, Conjunctiva are pink and non-injected, sclera clear OROPHARYNX:no exudate, no erythema and lips, buccal mucosa, and tongue normal  NECK: supple, thyroid normal size, non-tender, without nodularity LYMPH:  no palpable lymphadenopathy in the cervical, axillary or inguinal LUNGS: clear to auscultation and percussion with normal breathing effort HEART: regular rate & rhythm and no murmurs and no lower extremity edema ABDOMEN:abdomen soft, non-tender and normal bowel sounds MUSCULOSKELETAL:no cyanosis of digits and no clubbing  NEURO: alert & oriented x 3 with fluent speech, no focal motor/sensory deficits EXTREMITIES: No lower extremity edema  LABORATORY DATA:  I have reviewed the data as listed   Chemistry      Component Value Date/Time   NA 141 07/04/2016 0904   K 4.9 07/04/2016 0904   CL 105 05/16/2016 1254   CL 104 06/09/2012 1402   CO2 26 07/04/2016 0904   BUN 11.9 07/04/2016 0904   CREATININE 0.9  07/04/2016 0904      Component Value Date/Time   CALCIUM 9.4 07/04/2016 0904   ALKPHOS 150 07/04/2016 0904   AST 122 (H) 07/04/2016 0904   ALT 19 07/04/2016 0904   BILITOT 1.00 07/04/2016 0904       Lab Results  Component Value Date   WBC 4.5 07/25/2016   HGB 13.0 07/25/2016   HCT 38.4 07/25/2016   MCV 94.8 07/25/2016   PLT 180 07/25/2016   NEUTROABS 2.6 07/25/2016    ASSESSMENT & PLAN:  Metastatic breast cancer ([facility]) Metastatic breast cancer with innumerable liver metastases detected on ultrasound and recent MRI of the liver on 04/26/2016 (Prior history of left breast IDC T1 1 N0 stage IA 0.3 cm grade 1 tumor that was ER 38%, PR 93%, Ki-67 10%, HER-2 2+, no tissue for FISH testing, 0/1 lymph node negative, status post lumpectomy radiation and 5 years of Arimidex completed in February 2013)  PET/CT scan: 05/07/2016: Extensive metastatic involvement of both lobes of the liver,  primary could be liver, phalangeal or metastatic disease, hypermetabolic thoracic lymph nodes left axillary, left supraclavicular and right CP angle lymph nodes, hypermetabolic upper abdominal lymph nodes  Liver biopsy10/07/2016: Metastatic carcinoma breast primary strong positivity for CK 7 weak focal positivity for ER, GCDFP, negative for PR, CDX 2, TTF-1, WT 1; HER-2 positive  Treatment plan: 1. Taxotere Herceptin and Perjeta every 3 weeks palliative chemotherapy followed by Herceptin and Perjeta maintenance after 6 cycles started 06/13/16 ----------------------------------------------------------------------------------------------------------------------------------------- Current Treatment: Cycle 2 day 1 Herceptin Perjeta, adding Taxotere with cycle 3  Chemotherapy Toxicities:  1. Diarrhea due to Perjeta: I gave her instructions on how to take Imodium once again. 2. Elevated LFTs: They are coming down. We have decided to hold Taxotere forcycles 1 and 2. 3. Nausea after first treatment with  Herceptin and Perjeta: I suspect it is related to anxiety. We are adding Taxotere With today's cycle 3 of chemotherapy. We are starting at 25 mg/m. I will increase the dosage gradually with each cycle.   Goals of treatment: Palliation of symptoms and prolongation of life Return to clinic in 3 weeks for cycle 4.   No orders of the defined types were placed in this encounter.  The patient has a good understanding of the overall plan. she agrees with it. she will call with any problems that may develop before the next visit here.   Rulon Eisenmenger, MD 07/25/16

## 2016-08-14 NOTE — Assessment & Plan Note (Signed)
Metastatic breast cancer with innumerable liver metastases detected on ultrasound and recent MRI of the liver on 04/26/2016 (Prior history of left breast IDC T1 1 N0 stage IA 0.3 cm grade 1 tumor that was ER 38%, PR 93%, Ki-67 10%, HER-2 2+, no tissue for FISH testing, 0/1 lymph node negative, status post lumpectomy radiation and 5 years of Arimidex completed in February 2013)  PET/CT scan: 05/07/2016: Extensive metastatic involvement of both lobes of the liver, primary could be liver, phalangeal or metastatic disease, hypermetabolic thoracic lymph nodes left axillary, left supraclavicular and right CP angle lymph nodes, hypermetabolic upper abdominal lymph nodes  Liver biopsy10/07/2016: Metastatic carcinoma breast primary strong positivity for CK 7 weak focal positivity for ER, GCDFP, negative for PR, CDX 2, TTF-1, WT 1; HER-2 positive  Treatment plan: 1. Taxotere Herceptin and Perjeta every 3 weeks palliative chemotherapy followed by Herceptin and Perjeta maintenance after 6 cycles started 06/13/16 ----------------------------------------------------------------------------------------------------------------------------------------- Current Treatment: Cycle 3day 1Herceptin Perjeta, added Taxotere with cycle 4  Chemotherapy Toxicities:  1. Diarrhea due to Perjeta: I gave her instructions on how to take Imodium once again. 2. Elevated LFTs: They are coming down. We have decided to hold Taxotere forcycles 1 and 2. 3. Nausea after first treatment with Herceptin and Perjeta: I suspect it is related to anxiety. We are adding Taxotere With today's cycle 3 of chemotherapy. We are starting at 25 mg/m. I will increase the dosage gradually with each cycle.   Goals of treatment: Palliation of symptoms and prolongation of life Return to clinic in 3weeks for cycle 5.

## 2016-08-15 ENCOUNTER — Ambulatory Visit: Payer: Medicare Other | Admitting: Oncology

## 2016-08-15 ENCOUNTER — Ambulatory Visit (HOSPITAL_BASED_OUTPATIENT_CLINIC_OR_DEPARTMENT_OTHER): Payer: Medicare Other | Admitting: Hematology and Oncology

## 2016-08-15 ENCOUNTER — Encounter: Payer: Self-pay | Admitting: Oncology

## 2016-08-15 ENCOUNTER — Encounter: Payer: Self-pay | Admitting: Hematology and Oncology

## 2016-08-15 ENCOUNTER — Ambulatory Visit (HOSPITAL_BASED_OUTPATIENT_CLINIC_OR_DEPARTMENT_OTHER): Payer: Medicare Other

## 2016-08-15 ENCOUNTER — Other Ambulatory Visit (HOSPITAL_BASED_OUTPATIENT_CLINIC_OR_DEPARTMENT_OTHER): Payer: Medicare Other

## 2016-08-15 VITALS — BP 112/90 | HR 83 | Temp 98.2°F | Resp 16

## 2016-08-15 DIAGNOSIS — C787 Secondary malignant neoplasm of liver and intrahepatic bile duct: Secondary | ICD-10-CM

## 2016-08-15 DIAGNOSIS — Z5111 Encounter for antineoplastic chemotherapy: Secondary | ICD-10-CM

## 2016-08-15 DIAGNOSIS — C50919 Malignant neoplasm of unspecified site of unspecified female breast: Secondary | ICD-10-CM

## 2016-08-15 DIAGNOSIS — Z5112 Encounter for antineoplastic immunotherapy: Secondary | ICD-10-CM

## 2016-08-15 DIAGNOSIS — T8090XA Unspecified complication following infusion and therapeutic injection, initial encounter: Secondary | ICD-10-CM

## 2016-08-15 LAB — CBC WITH DIFFERENTIAL/PLATELET
BASO%: 0.6 % (ref 0.0–2.0)
Basophils Absolute: 0 10*3/uL (ref 0.0–0.1)
EOS%: 0.6 % (ref 0.0–7.0)
Eosinophils Absolute: 0 10*3/uL (ref 0.0–0.5)
HCT: 39.1 % (ref 34.8–46.6)
HGB: 13.2 g/dL (ref 11.6–15.9)
LYMPH%: 31 % (ref 14.0–49.7)
MCH: 31.7 pg (ref 25.1–34.0)
MCHC: 33.8 g/dL (ref 31.5–36.0)
MCV: 93.8 fL (ref 79.5–101.0)
MONO#: 0.8 10*3/uL (ref 0.1–0.9)
MONO%: 15.5 % — ABNORMAL HIGH (ref 0.0–14.0)
NEUT#: 2.6 10*3/uL (ref 1.5–6.5)
NEUT%: 52.3 % (ref 38.4–76.8)
Platelets: 233 10*3/uL (ref 145–400)
RBC: 4.17 10*6/uL (ref 3.70–5.45)
RDW: 14 % (ref 11.2–14.5)
WBC: 4.9 10*3/uL (ref 3.9–10.3)
lymph#: 1.5 10*3/uL (ref 0.9–3.3)

## 2016-08-15 LAB — COMPREHENSIVE METABOLIC PANEL
ALT: 11 U/L (ref 0–55)
ANION GAP: 8 meq/L (ref 3–11)
AST: 38 U/L — AB (ref 5–34)
Albumin: 3.3 g/dL — ABNORMAL LOW (ref 3.5–5.0)
Alkaline Phosphatase: 75 U/L (ref 40–150)
BUN: 13.2 mg/dL (ref 7.0–26.0)
CHLORIDE: 104 meq/L (ref 98–109)
CO2: 28 meq/L (ref 22–29)
CREATININE: 1 mg/dL (ref 0.6–1.1)
Calcium: 9.5 mg/dL (ref 8.4–10.4)
EGFR: 56 mL/min/{1.73_m2} — ABNORMAL LOW (ref >90)
Glucose: 103 mg/dl (ref 70–140)
POTASSIUM: 4.2 meq/L (ref 3.5–5.1)
Sodium: 140 mEq/L (ref 136–145)
Total Bilirubin: 0.66 mg/dL (ref 0.20–1.20)
Total Protein: 7 g/dL (ref 6.4–8.3)

## 2016-08-15 MED ORDER — SODIUM CHLORIDE 0.9 % IV SOLN
Freq: Once | INTRAVENOUS | Status: AC
  Administered 2016-08-15: 11:00:00 via INTRAVENOUS

## 2016-08-15 MED ORDER — DIPHENHYDRAMINE HCL 25 MG PO CAPS
50.0000 mg | ORAL_CAPSULE | Freq: Once | ORAL | Status: AC
  Administered 2016-08-15: 50 mg via ORAL

## 2016-08-15 MED ORDER — SODIUM CHLORIDE 0.9 % IV SOLN
420.0000 mg | Freq: Once | INTRAVENOUS | Status: AC
  Administered 2016-08-15: 420 mg via INTRAVENOUS
  Filled 2016-08-15: qty 14

## 2016-08-15 MED ORDER — PALONOSETRON HCL INJECTION 0.25 MG/5ML
0.2500 mg | Freq: Once | INTRAVENOUS | Status: AC
  Administered 2016-08-15: 0.25 mg via INTRAVENOUS

## 2016-08-15 MED ORDER — FAMOTIDINE IN NACL 20-0.9 MG/50ML-% IV SOLN
20.0000 mg | Freq: Once | INTRAVENOUS | Status: AC | PRN
  Administered 2016-08-15: 20 mg via INTRAVENOUS

## 2016-08-15 MED ORDER — ACETAMINOPHEN 325 MG PO TABS
650.0000 mg | ORAL_TABLET | Freq: Once | ORAL | Status: AC
  Administered 2016-08-15: 650 mg via ORAL

## 2016-08-15 MED ORDER — HEPARIN SOD (PORK) LOCK FLUSH 100 UNIT/ML IV SOLN
500.0000 [IU] | Freq: Once | INTRAVENOUS | Status: AC | PRN
  Administered 2016-08-15: 500 [IU]
  Filled 2016-08-15: qty 5

## 2016-08-15 MED ORDER — ACETAMINOPHEN 325 MG PO TABS
ORAL_TABLET | ORAL | Status: AC
  Filled 2016-08-15: qty 2

## 2016-08-15 MED ORDER — DIPHENHYDRAMINE HCL 25 MG PO CAPS
ORAL_CAPSULE | ORAL | Status: AC
  Filled 2016-08-15: qty 2

## 2016-08-15 MED ORDER — SODIUM CHLORIDE 0.9 % IV SOLN: Freq: Once | INTRAVENOUS | Status: DC

## 2016-08-15 MED ORDER — SODIUM CHLORIDE 0.9 % IV SOLN
25.0000 mg/m2 | Freq: Once | INTRAVENOUS | Status: AC
  Administered 2016-08-15: 50 mg via INTRAVENOUS
  Filled 2016-08-15: qty 5

## 2016-08-15 MED ORDER — METHYLPREDNISOLONE SODIUM SUCC 125 MG IJ SOLR
125.0000 mg | Freq: Once | INTRAMUSCULAR | Status: AC | PRN
  Administered 2016-08-15: 125 mg via INTRAVENOUS

## 2016-08-15 MED ORDER — SODIUM CHLORIDE 0.9% FLUSH
10.0000 mL | INTRAVENOUS | Status: DC | PRN
  Administered 2016-08-15: 10 mL
  Filled 2016-08-15: qty 10

## 2016-08-15 MED ORDER — DEXAMETHASONE SODIUM PHOSPHATE 10 MG/ML IJ SOLN
INTRAMUSCULAR | Status: AC
  Filled 2016-08-15: qty 1

## 2016-08-15 MED ORDER — DEXAMETHASONE SODIUM PHOSPHATE 10 MG/ML IJ SOLN
2.0000 mg | Freq: Once | INTRAMUSCULAR | Status: AC
  Administered 2016-08-15: 2 mg via INTRAVENOUS

## 2016-08-15 MED ORDER — DOCETAXEL CHEMO INJECTION 160 MG/16ML: 25.0000 mg/m2 | Freq: Once | INTRAVENOUS | Status: DC

## 2016-08-15 MED ORDER — PALONOSETRON HCL INJECTION 0.25 MG/5ML
INTRAVENOUS | Status: AC
  Filled 2016-08-15: qty 5

## 2016-08-15 MED ORDER — TRASTUZUMAB CHEMO 150 MG IV SOLR
6.0000 mg/kg | Freq: Once | INTRAVENOUS | Status: AC
  Administered 2016-08-15: 462 mg via INTRAVENOUS
  Filled 2016-08-15: qty 22

## 2016-08-15 NOTE — Progress Notes (Signed)
Pt stated she had prior reaction to perjeta x 2 during prior treatment.  Ailene Ravel, RN with Dr. [name] contacted to inquire about additional premedications to prevent reactions.  Per Ailene Ravel via Dr. [name], no additional orders at this time.

## 2016-08-15 NOTE — Progress Notes (Signed)
SYMPTOM MANAGEMENT CLINIC    Chief Complaint: Infusion reaction  HPI:  Monique Figueroa 83 y.o. female diagnosed with metastatic breast cancer who came in today for Taxotere Perjeta and Herceptin. This is the patient's fourth dose of Perjeta. She reacted this medication with the last infusion. The patient was approximately 10-15 minutes into her infusion when she developed flushing, tachycardia, and shortness of breath. O2 at 2 L was applied. She was given IV Solu-Medrol 125 mg along with Pepcid 20 mg. She responded very quickly to these medications with resolution of her flushing and tachycardia. Heart rate, O2 saturation, and temperature were at baseline.    Metastatic breast cancer ([place])   06/05/2006 Initial Diagnosis    Left breast biopsy: DCIS with apocrine features ER 30%, PR 13%      06/19/2006 Surgery    Left lumpectomy: stage I, pT1a, pN0(i)(sn), pMX, 0.3 cm IDC, grade 1, with extensive high-grade DCIS , margins negative ER 38 %, PR 93 %, Ki-67 10%, HER-2/neu 2+, no additional tissue available for FISH testing, with 0/1 left axillary lymph nodes.      07/30/2006 - 09/17/2006 Radiation Therapy    Adjuvant radiation therapy      09/24/2006 - 09/25/2011 Anti-estrogen oral therapy    Antiestrogen therapy with Arimidex 5 years      04/26/2016 Relapse/Recurrence    Innumerable hepatic lesions seen in both lobes most are 1-2 cm size somewhat confluent largest in the posterior right hepatic dome 3 x 4.6 cm      05/07/2016 PET scan    PET/CT scan: Extensive metastatic involvement of both lobes of the liver, primary could be liver, phalangeal or metastatic disease, hypermetabolic thoracic LN left axillary, left supraclavicular and right CP angle LN, hypermetabolic upper abdominal LN      05/16/2016 Initial Biopsy    Liver biopsy: Metastatic carcinoma breast primary strong positivity for CK 7 weak focal positivity for ER, GCDFP, negative for PR, CDX 2, TTF-1, WT 1; HER-2 positive        06/13/2016 -  Chemotherapy    Taxotere Herceptin and Perjeta every 3 weeks (Taxotere was held for elevated LFTs for cycles 1 and 2)        Review of Systems  Constitutional: Negative.  Negative for chills.  HENT: Negative.   Eyes: Negative.   Respiratory: Positive for shortness of breath.   Cardiovascular: Positive for palpitations.  Gastrointestinal: Negative.  Negative for abdominal pain, nausea and vomiting.  Genitourinary: Negative.   Musculoskeletal: Negative.   Skin:       Flushing  Neurological: Negative.   Endo/Heme/Allergies: Negative.   Psychiatric/Behavioral: Negative.     Past Medical History:  Diagnosis Date  . Appendicitis   . Breast cancer ([place]) 06/2006   Invasive ductal and DCIS of left breast  . Breast tumor    History of bilateral breast tumors/cysts  . Cyst of spinal meninges   . Diabetes mellitus without complication ([place])   . GERD (gastroesophageal reflux disease)   . Hypertension   . Hypothyroidism   . Metastatic breast cancer ([place]) 04/26/2016   mets to liver  . PONV (postoperative nausea and vomiting)   . Tuberculosis    medullary carcinoma    Past Surgical History:  Procedure Laterality Date  . APPENDECTOMY    . BREAST CYST EXCISION Bilateral    Several asprirations and excisions  . BREAST LUMPECTOMY WITH AXILLARY LYMPH NODE BIOPSY Left 06/19/2006   Invasive ductal and in situ carcinoma, node negative  .  PORTACATH PLACEMENT N/A 06/05/2016   Procedure: INSERTION PORT-A-CATH WITH [place];  Surgeon: Fanny Skates, MD;  Location: [facility];  Service: General;  Laterality: N/A;  . SPINE SURGERY    . TOTAL THYROIDECTOMY      has Metastatic breast cancer ([place]); Metastases to the liver [facility]); and Infusion reaction on her problem list.    is allergic to sulfa antibiotics.  Allergies as of 08/15/2016      Reactions   Sulfa Antibiotics Swelling      Medication List       Accurate as of 08/15/16  2:55 PM. Always use your  most recent med list.          hydrochlorothiazide 25 MG tablet Commonly known as:  HYDRODIURIL Take 25 mg by mouth daily.   HYDROcodone-acetaminophen 5-325 MG tablet Commonly known as:  NORCO/VICODIN Take 1-2 tablets by mouth every 4 (four) hours as needed for moderate pain or severe pain.   levothyroxine 125 MCG tablet Commonly known as:  SYNTHROID, LEVOTHROID Take 125 mcg by mouth daily before breakfast.   lidocaine-prilocaine cream Commonly known as:  EMLA Apply to affected area once   loratadine 10 MG tablet Commonly known as:  CLARITIN Take 10 mg by mouth daily as needed for allergies.   metFORMIN 500 MG tablet Commonly known as:  GLUCOPHAGE Take 500 mg by mouth daily with breakfast.   multivitamin with minerals tablet Take 1 tablet by mouth daily.   omeprazole 20 MG tablet Commonly known as:  PRILOSEC OTC Take 20 mg by mouth daily.   ondansetron 8 MG tablet Commonly known as:  ZOFRAN Take 1 tablet (8 mg total) by mouth 2 (two) times daily as needed for refractory nausea / vomiting.   prochlorperazine 10 MG tablet Commonly known as:  COMPAZINE Take 1 tablet (10 mg total) by mouth every 6 (six) hours as needed (Nausea or vomiting).   ranitidine 150 MG tablet Commonly known as:  ZANTAC Take 150 mg by mouth at bedtime.   vitamin B-12 500 MCG tablet Commonly known as:  CYANOCOBALAMIN Take 500 mcg by mouth daily as needed.   vitamin C 500 MG tablet Commonly known as:  ASCORBIC ACID Take 500 mg by mouth daily.        PHYSICAL EXAMINATION  Oncology Vitals 08/15/2016 08/15/2016  Height - -  Weight - -  Weight (lbs) - -  BMI (kg/m2) - -  Temp 98.2 -  Pulse 83 81  Resp 16 16  Resp (Historical as of 03/05/12) - -  SpO2 98 100  BSA (m2) - -   BP Readings from Last 2 Encounters:  08/15/16 112/90  08/15/16 (!) 156/79    Physical Exam  Constitutional: She is oriented to person, place, and time. She appears unhealthy. She appears distressed.  Has  rigors.   Eyes: Conjunctivae are normal. Pupils are equal, round, and reactive to light.  Neck: No tracheal deviation present.  Cardiovascular: Normal rate, regular rhythm and normal heart sounds.   Pulmonary/Chest: Effort normal and breath sounds normal. No stridor. No respiratory distress. She has no wheezes. She has no rales.  Abdominal: Soft. Bowel sounds are normal. There is no tenderness.  Lymphadenopathy:    She has no cervical adenopathy.  Neurological: She is alert and oriented to person, place, and time.  Skin: Skin is warm and dry. No rash noted.  Facial flushing  Psychiatric: Mood, memory, affect and judgment normal.    LABORATORY DATA:. Appointment on 08/15/2016  Component  Date Value Ref Range Status  . WBC 08/15/2016 4.9  3.9 - 10.3 10e3/uL Final  . NEUT# 08/15/2016 2.6  1.5 - 6.5 10e3/uL Final  . HGB 08/15/2016 13.2  11.6 - 15.9 g/dL Final  . HCT 08/15/2016 39.1  34.8 - 46.6 % Final  . Platelets 08/15/2016 233  145 - 400 10e3/uL Final  . MCV 08/15/2016 93.8  79.5 - 101.0 fL Final  . MCH 08/15/2016 31.7  25.1 - 34.0 pg Final  . MCHC 08/15/2016 33.8  31.5 - 36.0 g/dL Final  . RBC 08/15/2016 4.17  3.70 - 5.45 10e6/uL Final  . RDW 08/15/2016 14.0  11.2 - 14.5 % Final  . lymph# 08/15/2016 1.5  0.9 - 3.3 10e3/uL Final  . MONO# 08/15/2016 0.8  0.1 - 0.9 10e3/uL Final  . Eosinophils Absolute 08/15/2016 0.0  0.0 - 0.5 10e3/uL Final  . Basophils Absolute 08/15/2016 0.0  0.0 - 0.1 10e3/uL Final  . NEUT% 08/15/2016 52.3  38.4 - 76.8 % Final  . LYMPH% 08/15/2016 31.0  14.0 - 49.7 % Final  . MONO% 08/15/2016 15.5* 0.0 - 14.0 % Final  . EOS% 08/15/2016 0.6  0.0 - 7.0 % Final  . BASO% 08/15/2016 0.6  0.0 - 2.0 % Final  . Sodium 08/15/2016 140  136 - 145 mEq/L Final  . Potassium 08/15/2016 4.2  3.5 - 5.1 mEq/L Final  . Chloride 08/15/2016 104  98 - 109 mEq/L Final  . CO2 08/15/2016 28  22 - 29 mEq/L Final  . Glucose 08/15/2016 103  70 - 140 mg/dl Final  . BUN 08/15/2016 13.2   7.0 - 26.0 mg/dL Final  . Creatinine 08/15/2016 1.0  0.6 - 1.1 mg/dL Final  . Total Bilirubin 08/15/2016 0.66  0.20 - 1.20 mg/dL Final  . Alkaline Phosphatase 08/15/2016 75  40 - 150 U/L Final  . AST 08/15/2016 38* 5 - 34 U/L Final  . ALT 08/15/2016 11  0 - 55 U/L Final  . Total Protein 08/15/2016 7.0  6.4 - 8.3 g/dL Final  . Albumin 08/15/2016 3.3* 3.5 - 5.0 g/dL Final  . Calcium 08/15/2016 9.5  8.4 - 10.4 mg/dL Final  . Anion Gap 08/15/2016 8  3 - 11 mEq/L Final  . EGFR 08/15/2016 56* >90 ml/min/1.73 m2 Final    RADIOGRAPHIC STUDIES: No results found.  ASSESSMENT/PLAN:    No problem-specific Assessment & Plan notes found for this encounter.  The patient is an 83 year old female with metastatic breast cancer here for chemotherapy with Taxotere, Herceptin, and Perjeta. The patient had facial flushing, tachycardia and shortness of breath as a reaction to her Perjeta infusion. The infusion was stopped and she was given IV Solu-Medrol along with IV Pepcid with resolution of her symptoms. Discussed with Dr. [name] and we will continue on with the treatment today since her symptoms have resolved and monitor her closely. Consider pre-medicating the patient with IV site and Medrol and Pepcid prior to her next infusion. The Perjeta was resumed and infused without incident.   The patient will be discharged home at the completion of her infusion. She was instructed to report to the emergency room she develops any chest pain or shortness of breath. Patient stated understanding of all instructions; and was in agreement with this plan of care. The patient knows to call the clinic with any problems, questions or concerns.   Total time spent with patient was 20 minutes;  with greater than 50 percent of that time spent in face to face  counseling regarding patient's symptoms,  and coordination of care and follow up.   Mikey Bussing, NP 08/15/2016

## 2016-08-15 NOTE — Patient Instructions (Signed)
[facility] Discharge Instructions for Patients Receiving Chemotherapy  Today you received the following chemotherapy agents Herceptin, Perjeta and Taxotere  To help prevent nausea and vomiting after your treatment, we encourage you to take your nausea medication as directed. No Zofran for 3 days. Take Compazine instead.    If you develop nausea and vomiting that is not controlled by your nausea medication, call the clinic.   BELOW ARE SYMPTOMS THAT SHOULD BE REPORTED IMMEDIATELY:  *FEVER GREATER THAN 100.5 F  *CHILLS WITH OR WITHOUT FEVER  NAUSEA AND VOMITING THAT IS NOT CONTROLLED WITH YOUR NAUSEA MEDICATION  *UNUSUAL SHORTNESS OF BREATH  *UNUSUAL BRUISING OR BLEEDING  TENDERNESS IN MOUTH AND THROAT WITH OR WITHOUT PRESENCE OF ULCERS  *URINARY PROBLEMS  *BOWEL PROBLEMS  UNUSUAL RASH Items with * indicate a potential emergency and should be followed up as soon as possible.  Feel free to call the clinic you have any questions or concerns. The clinic phone number is [phone].  Please show the [place] at check-in to the Emergency Department and triage nurse.

## 2016-08-15 NOTE — Progress Notes (Signed)
1253: Pt complaint of shortness of breath, headache and feeling "hot". Patient's skin appears red. 2L O2 via [place] started, Mikey Bussing NP at chairside. 125mg  solu-medrol and 20mg  pepcid given (see eMAR). VS improving and stable (see flowsheets). Patient reports feeling better with headache resolved and no longer feeling hot or short of breath. VSS (see flowsheets) Per Mikey Bussing NP, Perjeta restarted at same rate. Perjeta restarted at 1342 and tolerated well with no further reaction or complaints.

## 2016-08-15 NOTE — Progress Notes (Signed)
Patient Care Team: [name], MD as PCP - General (Internal Medicine)  DIAGNOSIS:  Encounter Diagnosis  Name Primary?  . Metastatic breast cancer ([place])     SUMMARY OF ONCOLOGIC HISTORY:   Metastatic breast cancer ([place])   06/05/2006 Initial Diagnosis    Left breast biopsy: DCIS with apocrine features ER 30%, PR 13%      06/19/2006 Surgery    Left lumpectomy: stage I, pT1a, pN0(i)(sn), pMX, 0.3 cm IDC, grade 1, with extensive high-grade DCIS , margins negative ER 38 %, PR 93 %, Ki-67 10%, HER-2/neu 2+, no additional tissue available for FISH testing, with 0/1 left axillary lymph nodes.      07/30/2006 - 09/17/2006 Radiation Therapy    Adjuvant radiation therapy      09/24/2006 - 09/25/2011 Anti-estrogen oral therapy    Antiestrogen therapy with Arimidex 5 years      04/26/2016 Relapse/Recurrence    Innumerable hepatic lesions seen in both lobes most are 1-2 cm size somewhat confluent largest in the posterior right hepatic dome 3 x 4.6 cm      05/07/2016 PET scan    PET/CT scan: Extensive metastatic involvement of both lobes of the liver, primary could be liver, phalangeal or metastatic disease, hypermetabolic thoracic LN left axillary, left supraclavicular and right CP angle LN, hypermetabolic upper abdominal LN      05/16/2016 Initial Biopsy    Liver biopsy: Metastatic carcinoma breast primary strong positivity for CK 7 weak focal positivity for ER, GCDFP, negative for PR, CDX 2, TTF-1, WT 1; HER-2 positive      06/13/2016 -  Chemotherapy    Taxotere Herceptin and Perjeta every 3 weeks (Taxotere was held for elevated LFTs for cycles 1 and 2)        CHIEF COMPLIANT: Cycle 4 Taxotere Herceptin and Perjeta (Taxotere was added with cycle 3)  INTERVAL HISTORY: Monique Figueroa is a 83 year old with above-mentioned history of metastatic breast cancer currently on palliative treatment with Taxotere Herceptin and Perjeta. June the last cycle of chemotherapy she had chest  tightness and shortness of breath. This was during Perjeta treatment. After she left for home she did very well. She had a mild rash on the face yesterday which resolved as of today. She had excellent energy levels and did not have any nausea vomiting. She actually received her first dose of Taxotere last cycle. She appears to have tolerated it fairly well. She did not have any nausea vomiting. She has noticed alopecia. She has noticed some fatigue as well. Patient continues to have diarrhea related to Perjeta which is being well controlled with Imodium.  REVIEW OF SYSTEMS:   Constitutional: Denies fevers, chills or abnormal weight loss, complains of fatigue Eyes: Denies blurriness of vision Ears, nose, mouth, throat, and face: Denies mucositis or sore throat Respiratory: Denies cough, dyspnea or wheezes Cardiovascular: Denies palpitation, chest discomfort Gastrointestinal: Diarrhea with Perjeta Skin: Denies abnormal skin rashes Lymphatics: Denies new lymphadenopathy or easy bruising Neurological:Denies numbness, tingling or new weaknesses Behavioral/Psych: Mood is stable, no new changes  Extremities: No lower extremity edema  All other systems were reviewed with the patient and are negative.  I have reviewed the past medical history, past surgical history, social history and family history with the patient and they are unchanged from previous note.  ALLERGIES:  is allergic to sulfa antibiotics.  MEDICATIONS:  Current Outpatient Prescriptions  Medication Sig Dispense Refill  . hydrochlorothiazide (HYDRODIURIL) 25 MG tablet Take 25 mg by mouth daily.     Marland Kitchen  HYDROcodone-acetaminophen (NORCO/VICODIN) 5-325 MG tablet Take 1-2 tablets by mouth every 4 (four) hours as needed for moderate pain or severe pain. 40 tablet 0  . levothyroxine (SYNTHROID, LEVOTHROID) 125 MCG tablet Take 125 mcg by mouth daily before breakfast.     . lidocaine-prilocaine (EMLA) cream Apply to affected area once 30 g 3    . loratadine (CLARITIN) 10 MG tablet Take 10 mg by mouth daily as needed for allergies.    . metFORMIN (GLUCOPHAGE) 500 MG tablet Take 500 mg by mouth daily with breakfast.     . Multiple Vitamins-Minerals (MULTIVITAMIN WITH MINERALS) tablet Take 1 tablet by mouth daily.    Marland Kitchen omeprazole (PRILOSEC OTC) 20 MG tablet Take 20 mg by mouth daily.    . ondansetron (ZOFRAN) 8 MG tablet Take 1 tablet (8 mg total) by mouth 2 (two) times daily as needed for refractory nausea / vomiting. 30 tablet 1  . prochlorperazine (COMPAZINE) 10 MG tablet Take 1 tablet (10 mg total) by mouth every 6 (six) hours as needed (Nausea or vomiting). 30 tablet 1  . ranitidine (ZANTAC) 150 MG tablet Take 150 mg by mouth at bedtime.    . vitamin B-12 (CYANOCOBALAMIN) 500 MCG tablet Take 500 mcg by mouth daily as needed.    . vitamin C (ASCORBIC ACID) 500 MG tablet Take 500 mg by mouth daily.     No current facility-administered medications for this visit.     PHYSICAL EXAMINATION: ECOG PERFORMANCE STATUS: 1 - Symptomatic but completely ambulatory  Vitals:   08/15/16 0956  BP: (!) 156/79  Pulse: 79  Resp: 19  Temp: 97.1 F (36.2 C)   Filed Weights   08/15/16 0956  Weight: 162 lb (73.5 kg)    GENERAL:alert, no distress and comfortable SKIN: skin color, texture, turgor are normal, no rashes or significant lesions EYES: normal, Conjunctiva are pink and non-injected, sclera clear OROPHARYNX:no exudate, no erythema and lips, buccal mucosa, and tongue normal  NECK: supple, thyroid normal size, non-tender, without nodularity LYMPH:  no palpable lymphadenopathy in the cervical, axillary or inguinal LUNGS: clear to auscultation and percussion with normal breathing effort HEART: regular rate & rhythm and no murmurs and no lower extremity edema ABDOMEN:abdomen soft, non-tender and normal bowel sounds MUSCULOSKELETAL:no cyanosis of digits and no clubbing  NEURO: alert & oriented x 3 with fluent speech, no focal  motor/sensory deficits EXTREMITIES: No lower extremity edema  LABORATORY DATA:  I have reviewed the data as listed   Chemistry      Component Value Date/Time   NA 140 08/15/2016 0932   K 4.2 08/15/2016 0932   CL 105 05/16/2016 1254   CL 104 06/09/2012 1402   CO2 28 08/15/2016 0932   BUN 13.2 08/15/2016 0932   CREATININE 1.0 08/15/2016 0932      Component Value Date/Time   CALCIUM 9.5 08/15/2016 0932   ALKPHOS 75 08/15/2016 0932   AST 38 (H) 08/15/2016 0932   ALT 11 08/15/2016 0932   BILITOT 0.66 08/15/2016 0932       Lab Results  Component Value Date   WBC 4.9 08/15/2016   HGB 13.2 08/15/2016   HCT 39.1 08/15/2016   MCV 93.8 08/15/2016   PLT 233 08/15/2016   NEUTROABS 2.6 08/15/2016    ASSESSMENT & PLAN:  Metastatic breast cancer ([facility]) Metastatic breast cancer with innumerable liver metastases detected on ultrasound and recent MRI of the liver on 04/26/2016 (Prior history of left breast IDC T1 1 N0 stage IA 0.3 cm grade  1 tumor that was ER 38%, PR 93%, Ki-67 10%, HER-2 2+, no tissue for FISH testing, 0/1 lymph node negative, status post lumpectomy radiation and 5 years of Arimidex completed in February 2013)  PET/CT scan: 05/07/2016: Extensive metastatic involvement of both lobes of the liver, primary could be liver, phalangeal or metastatic disease, hypermetabolic thoracic lymph nodes left axillary, left supraclavicular and right CP angle lymph nodes, hypermetabolic upper abdominal lymph nodes  Liver biopsy10/07/2016: Metastatic carcinoma breast primary strong positivity for CK 7 weak focal positivity for ER, GCDFP, negative for PR, CDX 2, TTF-1, WT 1; HER-2 positive  Treatment plan: 1. Taxotere Herceptin and Perjeta every 3 weeks palliative chemotherapy followed by Herceptin and Perjeta maintenance after 6 cycles started  06/13/16 ----------------------------------------------------------------------------------------------------------------------------------------- Current Treatment: Cycle 4day 1Herceptin Perjeta, added Taxotere with cycle 2  Chemotherapy Toxicities:  1. Diarrhea due to Perjeta: Controlled with Imodium 2. Elevated LFTs: Being monitored closely 3. Nausea after first treatment with Herceptin and Perjeta: Resolved. We added Taxotere with cycle 3 of chemotherapy at 25 mg/m. We may increase the dosage gradually with each cycle if she tolerates.  Goals of treatment: Palliation of symptoms and prolongation of life Our plan is to perform scans after 6 cycles of treatment. Return to clinic in 3weeks for cycle 5.  I spent 25 minutes talking to the patient of which more than half was spent in counseling and coordination of care.  No orders of the defined types were placed in this encounter.  The patient has a good understanding of the overall plan. she agrees with it. she will call with any problems that may develop before the next visit here.   Rulon Eisenmenger, MD 08/15/16

## 2016-09-04 NOTE — Assessment & Plan Note (Signed)
Metastatic breast cancer with innumerable liver metastases detected on ultrasound and recent MRI of the liver on 04/26/2016 (Prior history of left breast IDC T1 1 N0 stage IA 0.3 cm grade 1 tumor that was ER 38%, PR 93%, Ki-67 10%, HER-2 2+, no tissue for FISH testing, 0/1 lymph node negative, status post lumpectomy radiation and 5 years of Arimidex completed in February 2013)  PET/CT scan: 05/07/2016: Extensive metastatic involvement of both lobes of the liver, primary could be liver, phalangeal or metastatic disease, hypermetabolic thoracic lymph nodes left axillary, left supraclavicular and right CP angle lymph nodes, hypermetabolic upper abdominal lymph nodes  Liver biopsy10/07/2016: Metastatic carcinoma breast primary strong positivity for CK 7 weak focal positivity for ER, GCDFP, negative for PR, CDX 2, TTF-1, WT 1; HER-2 positive  Treatment plan: 1. Taxotere Herceptin and Perjeta every 3 weeks palliative chemotherapy followed by Herceptin and Perjeta maintenance after 6 cycles started 06/13/16 ----------------------------------------------------------------------------------------------------------------------------------------- Current Treatment: Cycle 5day 1Herceptin Perjeta, added Taxotere with cycle 2  Chemotherapy Toxicities:  1. Diarrhea due to Perjeta: Controlled with Imodium 2. Elevated LFTs: Being monitored closely 3. Nausea after first treatment with Herceptin and Perjeta: Resolved. We added Taxotere with cycle 3 of chemotherapy at 25 mg/m. We may increase the dosage gradually with each cycle if she tolerates.  Goals of treatment: Palliation of symptoms and prolongation of life Our plan is to perform scans after 6 cycles of treatment. Return to clinic in 3weeks for cycle 6.

## 2016-09-05 ENCOUNTER — Ambulatory Visit (HOSPITAL_BASED_OUTPATIENT_CLINIC_OR_DEPARTMENT_OTHER): Payer: Medicare Other

## 2016-09-05 ENCOUNTER — Other Ambulatory Visit: Payer: Self-pay

## 2016-09-05 ENCOUNTER — Ambulatory Visit (HOSPITAL_BASED_OUTPATIENT_CLINIC_OR_DEPARTMENT_OTHER): Payer: Medicare Other | Admitting: Hematology and Oncology

## 2016-09-05 ENCOUNTER — Other Ambulatory Visit (HOSPITAL_BASED_OUTPATIENT_CLINIC_OR_DEPARTMENT_OTHER): Payer: Medicare Other

## 2016-09-05 ENCOUNTER — Encounter: Payer: Self-pay | Admitting: Hematology and Oncology

## 2016-09-05 VITALS — BP 154/76 | HR 99 | Temp 98.4°F | Resp 16

## 2016-09-05 DIAGNOSIS — C787 Secondary malignant neoplasm of liver and intrahepatic bile duct: Secondary | ICD-10-CM | POA: Diagnosis not present

## 2016-09-05 DIAGNOSIS — C50919 Malignant neoplasm of unspecified site of unspecified female breast: Secondary | ICD-10-CM | POA: Diagnosis not present

## 2016-09-05 DIAGNOSIS — C778 Secondary and unspecified malignant neoplasm of lymph nodes of multiple regions: Secondary | ICD-10-CM

## 2016-09-05 DIAGNOSIS — Z5111 Encounter for antineoplastic chemotherapy: Secondary | ICD-10-CM

## 2016-09-05 DIAGNOSIS — Z5112 Encounter for antineoplastic immunotherapy: Secondary | ICD-10-CM

## 2016-09-05 LAB — COMPREHENSIVE METABOLIC PANEL
ALT: 11 U/L (ref 0–55)
AST: 25 U/L (ref 5–34)
Albumin: 3.4 g/dL — ABNORMAL LOW (ref 3.5–5.0)
Alkaline Phosphatase: 65 U/L (ref 40–150)
Anion Gap: 10 mEq/L (ref 3–11)
BUN: 12.9 mg/dL (ref 7.0–26.0)
CHLORIDE: 104 meq/L (ref 98–109)
CO2: 24 meq/L (ref 22–29)
Calcium: 9.1 mg/dL (ref 8.4–10.4)
Creatinine: 0.7 mg/dL (ref 0.6–1.1)
EGFR: 76 mL/min/{1.73_m2} — ABNORMAL LOW (ref >90)
Glucose: 149 mg/dl — ABNORMAL HIGH (ref 70–140)
POTASSIUM: 3.6 meq/L (ref 3.5–5.1)
Sodium: 139 mEq/L (ref 136–145)
Total Bilirubin: 0.54 mg/dL (ref 0.20–1.20)
Total Protein: 6.8 g/dL (ref 6.4–8.3)

## 2016-09-05 LAB — CBC WITH DIFFERENTIAL/PLATELET
BASO%: 0.8 % (ref 0.0–2.0)
BASOS ABS: 0 10*3/uL (ref 0.0–0.1)
EOS%: 0.4 % (ref 0.0–7.0)
Eosinophils Absolute: 0 10*3/uL (ref 0.0–0.5)
HCT: 34.7 % — ABNORMAL LOW (ref 34.8–46.6)
HGB: 12.4 g/dL (ref 11.6–15.9)
LYMPH%: 31.7 % (ref 14.0–49.7)
MCH: 31.6 pg (ref 25.1–34.0)
MCHC: 35.7 g/dL (ref 31.5–36.0)
MCV: 88.3 fL (ref 79.5–101.0)
MONO#: 0.6 10*3/uL (ref 0.1–0.9)
MONO%: 11.5 % (ref 0.0–14.0)
NEUT#: 2.7 10*3/uL (ref 1.5–6.5)
NEUT%: 55.6 % (ref 38.4–76.8)
Platelets: 209 10*3/uL (ref 145–400)
RBC: 3.93 10*6/uL (ref 3.70–5.45)
RDW: 14.3 % (ref 11.2–14.5)
WBC: 4.9 10*3/uL (ref 3.9–10.3)
lymph#: 1.5 10*3/uL (ref 0.9–3.3)

## 2016-09-05 MED ORDER — FAMOTIDINE IN NACL 20-0.9 MG/50ML-% IV SOLN
20.0000 mg | Freq: Once | INTRAVENOUS | Status: AC | PRN
  Administered 2016-09-05: 20 mg via INTRAVENOUS

## 2016-09-05 MED ORDER — DIPHENHYDRAMINE HCL 25 MG PO CAPS
50.0000 mg | ORAL_CAPSULE | Freq: Once | ORAL | Status: AC
  Administered 2016-09-05: 50 mg via ORAL

## 2016-09-05 MED ORDER — DEXTROSE 5 % IV SOLN
25.0000 mg/m2 | Freq: Once | INTRAVENOUS | Status: AC
  Administered 2016-09-05: 50 mg via INTRAVENOUS
  Filled 2016-09-05: qty 5

## 2016-09-05 MED ORDER — ACETAMINOPHEN 325 MG PO TABS
650.0000 mg | ORAL_TABLET | Freq: Once | ORAL | Status: AC
  Administered 2016-09-05: 650 mg via ORAL

## 2016-09-05 MED ORDER — TRASTUZUMAB CHEMO 150 MG IV SOLR
6.0000 mg/kg | Freq: Once | INTRAVENOUS | Status: AC
  Administered 2016-09-05: 462 mg via INTRAVENOUS
  Filled 2016-09-05: qty 22

## 2016-09-05 MED ORDER — HEPARIN SOD (PORK) LOCK FLUSH 100 UNIT/ML IV SOLN
500.0000 [IU] | Freq: Once | INTRAVENOUS | Status: AC | PRN
  Administered 2016-09-05: 500 [IU]
  Filled 2016-09-05: qty 5

## 2016-09-05 MED ORDER — PALONOSETRON HCL INJECTION 0.25 MG/5ML
0.2500 mg | Freq: Once | INTRAVENOUS | Status: AC
  Administered 2016-09-05: 0.25 mg via INTRAVENOUS

## 2016-09-05 MED ORDER — ACETAMINOPHEN 325 MG PO TABS
ORAL_TABLET | ORAL | Status: AC
Start: 2016-09-05 — End: 2016-09-05
  Filled 2016-09-05: qty 2

## 2016-09-05 MED ORDER — DEXAMETHASONE SODIUM PHOSPHATE 10 MG/ML IJ SOLN
INTRAMUSCULAR | Status: AC
  Filled 2016-09-05: qty 1

## 2016-09-05 MED ORDER — DIPHENHYDRAMINE HCL 25 MG PO CAPS
ORAL_CAPSULE | ORAL | Status: AC
  Filled 2016-09-05: qty 2

## 2016-09-05 MED ORDER — DEXAMETHASONE SODIUM PHOSPHATE 10 MG/ML IJ SOLN: 2.0000 mg | Freq: Once | INTRAMUSCULAR | Status: DC

## 2016-09-05 MED ORDER — SODIUM CHLORIDE 0.9 % IV SOLN
10.0000 mg | Freq: Once | INTRAVENOUS | Status: DC
  Filled 2016-09-05: qty 1

## 2016-09-05 MED ORDER — METHYLPREDNISOLONE SODIUM SUCC 125 MG IJ SOLR
125.0000 mg | Freq: Once | INTRAMUSCULAR | Status: AC | PRN
  Administered 2016-09-05: 125 mg via INTRAVENOUS

## 2016-09-05 MED ORDER — SODIUM CHLORIDE 0.9% FLUSH
10.0000 mL | INTRAVENOUS | Status: DC | PRN
  Administered 2016-09-05: 10 mL
  Filled 2016-09-05: qty 10

## 2016-09-05 MED ORDER — SODIUM CHLORIDE 0.9 % IV SOLN
420.0000 mg | Freq: Once | INTRAVENOUS | Status: AC
  Administered 2016-09-05: 420 mg via INTRAVENOUS
  Filled 2016-09-05: qty 14

## 2016-09-05 MED ORDER — SODIUM CHLORIDE 0.9 % IV SOLN
Freq: Once | INTRAVENOUS | Status: AC
  Administered 2016-09-05: 11:00:00 via INTRAVENOUS

## 2016-09-05 MED ORDER — PALONOSETRON HCL INJECTION 0.25 MG/5ML
INTRAVENOUS | Status: AC
  Filled 2016-09-05: qty 5

## 2016-09-05 MED ORDER — DEXAMETHASONE SODIUM PHOSPHATE 10 MG/ML IJ SOLN
10.0000 mg | Freq: Once | INTRAMUSCULAR | Status: AC
  Administered 2016-09-05: 10 mg via INTRAVENOUS

## 2016-09-05 NOTE — Patient Instructions (Signed)
[facility] Discharge Instructions for Patients Receiving Chemotherapy  Today you received the following chemotherapy agents :  Herceptin, Perjeta, Taxotere.  To help prevent nausea and vomiting after your treatment, we encourage you to take your nausea medication as prescribed.    If you develop nausea and vomiting that is not controlled by your nausea medication, call the clinic.   BELOW ARE SYMPTOMS THAT SHOULD BE REPORTED IMMEDIATELY:  *FEVER GREATER THAN 100.5 F  *CHILLS WITH OR WITHOUT FEVER  NAUSEA AND VOMITING THAT IS NOT CONTROLLED WITH YOUR NAUSEA MEDICATION  *UNUSUAL SHORTNESS OF BREATH  *UNUSUAL BRUISING OR BLEEDING  TENDERNESS IN MOUTH AND THROAT WITH OR WITHOUT PRESENCE OF ULCERS  *URINARY PROBLEMS  *BOWEL PROBLEMS  UNUSUAL RASH Items with * indicate a potential emergency and should be followed up as soon as possible.  Feel free to call the clinic you have any questions or concerns. The clinic phone number is [phone].  Please show the [facility] at check-in to the Emergency Department and triage nurse.

## 2016-09-05 NOTE — Progress Notes (Signed)
1310 -  VSS.  Pt stated feeling much better.  A&O x 3.   Explanations given to pt/son of Taxotere starting.  Pt was stable ambulating to bathroom with son.

## 2016-09-05 NOTE — Progress Notes (Signed)
At approximately 1226 pt's son reported to RN that pt may be having a reaction - flushed face, headache, increased respirations.  Perjeta infusion stopped immediately.  IV solumedrol, Pepcid given.  Dr Jana Hakim came to chair side and decision made to discontinue Perjeta.    At 1236 reaction appears to have resolved.  Pt getting NS at this time.  Per Dr Jana Hakim, will proceed with Taxotere at 1300.

## 2016-09-05 NOTE — Progress Notes (Signed)
Patient Care Team: [name], MD as PCP - General (Internal Medicine)  DIAGNOSIS:  Encounter Diagnosis  Name Primary?  . Metastatic breast cancer ([place])     SUMMARY OF ONCOLOGIC HISTORY:   Metastatic breast cancer ([place])   06/05/2006 Initial Diagnosis    Left breast biopsy: DCIS with apocrine features ER 30%, PR 13%      06/19/2006 Surgery    Left lumpectomy: stage I, pT1a, pN0(i)(sn), pMX, 0.3 cm IDC, grade 1, with extensive high-grade DCIS , margins negative ER 38 %, PR 93 %, Ki-67 10%, HER-2/neu 2+, no additional tissue available for FISH testing, with 0/1 left axillary lymph nodes.      07/30/2006 - 09/17/2006 Radiation Therapy    Adjuvant radiation therapy      09/24/2006 - 09/25/2011 Anti-estrogen oral therapy    Antiestrogen therapy with Arimidex 5 years      04/26/2016 Relapse/Recurrence    Innumerable hepatic lesions seen in both lobes most are 1-2 cm size somewhat confluent largest in the posterior right hepatic dome 3 x 4.6 cm      05/07/2016 PET scan    PET/CT scan: Extensive metastatic involvement of both lobes of the liver, primary could be liver, phalangeal or metastatic disease, hypermetabolic thoracic LN left axillary, left supraclavicular and right CP angle LN, hypermetabolic upper abdominal LN      05/16/2016 Initial Biopsy    Liver biopsy: Metastatic carcinoma breast primary strong positivity for CK 7 weak focal positivity for ER, GCDFP, negative for PR, CDX 2, TTF-1, WT 1; HER-2 positive      06/13/2016 -  Chemotherapy    Taxotere Herceptin and Perjeta every 3 weeks (Taxotere was held for elevated LFTs for cycles 1 and 2)        CHIEF COMPLIANT: Cycle 5 Herceptin and Perjeta, cycle 3 Taxotere  INTERVAL HISTORY: Monique Figueroa is a 83 year old with above-mentioned history of metastatic breast cancer currently on Taxotere Herceptin Perjeta. Today is cycle 3 of the Taxotere. This is cycle 5 of Herceptin and Perjeta. For the last 2 cycles she had  reaction to Perjeta. Because of this we would like to add steroids as premedication. She will also get Benadryl and Tylenol. Otherwise she has tolerated chemotherapy fairly well. Did not have any nausea vomiting. Monitoring her closely for toxicities.  REVIEW OF SYSTEMS:   Constitutional: Denies fevers, chills or abnormal weight loss Eyes: Denies blurriness of vision Ears, nose, mouth, throat, and face: Denies mucositis or sore throat Respiratory: Denies cough, dyspnea or wheezes Cardiovascular: Denies palpitation, chest discomfort Gastrointestinal:  Denies nausea, heartburn or change in bowel habits Skin: Denies abnormal skin rashes Lymphatics: Denies new lymphadenopathy or easy bruising Neurological:Denies numbness, tingling or new weaknesses Behavioral/Psych: Mood is stable, no new changes  Extremities: No lower extremity edema Breast:  denies any pain or lumps or nodules in either breasts All other systems were reviewed with the patient and are negative.  I have reviewed the past medical history, past surgical history, social history and family history with the patient and they are unchanged from previous note.  ALLERGIES:  is allergic to sulfa antibiotics.  MEDICATIONS:  Current Outpatient Prescriptions  Medication Sig Dispense Refill  . hydrochlorothiazide (HYDRODIURIL) 25 MG tablet Take 25 mg by mouth daily.     Marland Kitchen HYDROcodone-acetaminophen (NORCO/VICODIN) 5-325 MG tablet Take 1-2 tablets by mouth every 4 (four) hours as needed for moderate pain or severe pain. 40 tablet 0  . levothyroxine (SYNTHROID, LEVOTHROID) 125 MCG tablet Take 125  mcg by mouth daily before breakfast.     . lidocaine-prilocaine (EMLA) cream Apply to affected area once 30 g 3  . loratadine (CLARITIN) 10 MG tablet Take 10 mg by mouth daily as needed for allergies.    . metFORMIN (GLUCOPHAGE) 500 MG tablet Take 500 mg by mouth daily with breakfast.     . Multiple Vitamins-Minerals (MULTIVITAMIN WITH MINERALS)  tablet Take 1 tablet by mouth daily.    Marland Kitchen omeprazole (PRILOSEC OTC) 20 MG tablet Take 20 mg by mouth daily.    . ondansetron (ZOFRAN) 8 MG tablet Take 1 tablet (8 mg total) by mouth 2 (two) times daily as needed for refractory nausea / vomiting. 30 tablet 1  . prochlorperazine (COMPAZINE) 10 MG tablet Take 1 tablet (10 mg total) by mouth every 6 (six) hours as needed (Nausea or vomiting). 30 tablet 1  . ranitidine (ZANTAC) 150 MG tablet Take 150 mg by mouth at bedtime.    . vitamin B-12 (CYANOCOBALAMIN) 500 MCG tablet Take 500 mcg by mouth daily as needed.    . vitamin C (ASCORBIC ACID) 500 MG tablet Take 500 mg by mouth daily.     No current facility-administered medications for this visit.     PHYSICAL EXAMINATION: ECOG PERFORMANCE STATUS: 1 - Symptomatic but completely ambulatory  Vitals:   09/05/16 0938  BP: (!) 160/82  Pulse: (!) 102  Resp: 17  Temp: 97.6 F (36.4 C)   Filed Weights   09/05/16 0938  Weight: 166 lb 11.2 oz (75.6 kg)    GENERAL:alert, no distress and comfortable SKIN: skin color, texture, turgor are normal, no rashes or significant lesions EYES: normal, Conjunctiva are pink and non-injected, sclera clear OROPHARYNX:no exudate, no erythema and lips, buccal mucosa, and tongue normal  NECK: supple, thyroid normal size, non-tender, without nodularity LYMPH:  no palpable lymphadenopathy in the cervical, axillary or inguinal LUNGS: clear to auscultation and percussion with normal breathing effort HEART: regular rate & rhythm and no murmurs and no lower extremity edema ABDOMEN:abdomen soft, non-tender and normal bowel sounds MUSCULOSKELETAL:no cyanosis of digits and no clubbing  NEURO: alert & oriented x 3 with fluent speech, no focal motor/sensory deficits EXTREMITIES: No lower extremity edema BREAST: No palpable masses or nodules in either right or left breasts. No palpable axillary supraclavicular or infraclavicular adenopathy no breast tenderness or nipple  discharge. (exam performed in the presence of a chaperone)  LABORATORY DATA:  I have reviewed the data as listed   Chemistry      Component Value Date/Time   NA 140 08/15/2016 0932   K 4.2 08/15/2016 0932   CL 105 05/16/2016 1254   CL 104 06/09/2012 1402   CO2 28 08/15/2016 0932   BUN 13.2 08/15/2016 0932   CREATININE 1.0 08/15/2016 0932      Component Value Date/Time   CALCIUM 9.5 08/15/2016 0932   ALKPHOS 75 08/15/2016 0932   AST 38 (H) 08/15/2016 0932   ALT 11 08/15/2016 0932   BILITOT 0.66 08/15/2016 0932       Lab Results  Component Value Date   WBC 4.9 09/05/2016   HGB 12.4 09/05/2016   HCT 34.7 (L) 09/05/2016   MCV 88.3 09/05/2016   PLT 209 09/05/2016   NEUTROABS 2.7 09/05/2016    ASSESSMENT & PLAN:  Metastatic breast cancer ([place]) Metastatic breast cancer with innumerable liver metastases detected on ultrasound and recent MRI of the liver on 04/26/2016 (Prior history of left breast IDC T1 1 N0 stage IA 0.3 cm  grade 1 tumor that was ER 38%, PR 93%, Ki-67 10%, HER-2 2+, no tissue for FISH testing, 0/1 lymph node negative, status post lumpectomy radiation and 5 years of Arimidex completed in February 2013)  PET/CT scan: 05/07/2016: Extensive metastatic involvement of both lobes of the liver, primary could be liver, phalangeal or metastatic disease, hypermetabolic thoracic lymph nodes left axillary, left supraclavicular and right CP angle lymph nodes, hypermetabolic upper abdominal lymph nodes  Liver biopsy10/07/2016: Metastatic carcinoma breast primary strong positivity for CK 7 weak focal positivity for ER, GCDFP, negative for PR, CDX 2, TTF-1, WT 1; HER-2 positive  Treatment plan: 1. Taxotere Herceptin and Perjeta every 3 weeks palliative chemotherapy followed by Herceptin and Perjeta maintenance after 6 cycles started  06/13/16 ----------------------------------------------------------------------------------------------------------------------------------------- Current Treatment: Cycle 5day 1Herceptin Perjeta, added Taxotere with cycle 2 (today is cycle 3 Taxotere)  Chemotherapy Toxicities:  1. Diarrhea due to Perjeta: Controlled with Imodium 2. Elevated LFTs: Being monitored closely 3. Nausea after first treatment with Herceptin and Perjeta: Resolved. 4. Infusion reaction to Perjeta: We will add steroids as premedication. 5. Redness of the face 24 hours after chemotherapy. Taxotere  We added Taxotere with cycle 3 of chemotherapy at 25 mg/m.   Goals of treatment: Palliation of symptoms and prolongation of life Our plan is to perform scans after 6 cycles of treatment. Return to clinic in 3weeks for cycle 6.  I spent 25 minutes talking to the patient of which more than half was spent in counseling and coordination of care.  No orders of the defined types were placed in this encounter.  The patient has a good understanding of the overall plan. she agrees with it. she will call with any problems that may develop before the next visit here.   Rulon Eisenmenger, MD 09/05/16

## 2016-09-06 ENCOUNTER — Ambulatory Visit (HOSPITAL_COMMUNITY)
Admission: RE | Admit: 2016-09-06 | Discharge: 2016-09-06 | Disposition: A | Discharge status: Home / Self Care | Payer: Medicare Other | Source: Ambulatory Visit | Attending: Hematology and Oncology | Admitting: Hematology and Oncology

## 2016-09-06 DIAGNOSIS — C50919 Malignant neoplasm of unspecified site of unspecified female breast: Secondary | ICD-10-CM

## 2016-09-06 DIAGNOSIS — I083 Combined rheumatic disorders of mitral, aortic and tricuspid valves: Secondary | ICD-10-CM | POA: Diagnosis not present

## 2016-09-06 DIAGNOSIS — C787 Secondary malignant neoplasm of liver and intrahepatic bile duct: Secondary | ICD-10-CM

## 2016-09-06 DIAGNOSIS — K219 Gastro-esophageal reflux disease without esophagitis: Secondary | ICD-10-CM | POA: Diagnosis not present

## 2016-09-06 DIAGNOSIS — I10 Essential (primary) hypertension: Secondary | ICD-10-CM | POA: Diagnosis not present

## 2016-09-06 DIAGNOSIS — E785 Hyperlipidemia, unspecified: Secondary | ICD-10-CM | POA: Insufficient documentation

## 2016-09-06 NOTE — Progress Notes (Signed)
  Echocardiogram 2D Echocardiogram has been performed.  Monique Figueroa M 09/06/2016, 11:36 AM

## 2016-09-26 ENCOUNTER — Ambulatory Visit: Payer: Medicare Other

## 2016-09-26 ENCOUNTER — Ambulatory Visit (HOSPITAL_BASED_OUTPATIENT_CLINIC_OR_DEPARTMENT_OTHER): Payer: Medicare Other

## 2016-09-26 ENCOUNTER — Ambulatory Visit (HOSPITAL_BASED_OUTPATIENT_CLINIC_OR_DEPARTMENT_OTHER): Payer: Medicare Other | Admitting: Hematology and Oncology

## 2016-09-26 ENCOUNTER — Encounter: Payer: Self-pay | Admitting: Hematology and Oncology

## 2016-09-26 ENCOUNTER — Other Ambulatory Visit (HOSPITAL_BASED_OUTPATIENT_CLINIC_OR_DEPARTMENT_OTHER): Payer: Medicare Other

## 2016-09-26 ENCOUNTER — Ambulatory Visit: Payer: Medicare Other | Admitting: Hematology and Oncology

## 2016-09-26 VITALS — BP 140/64 | HR 74 | Temp 98.2°F | Resp 17 | Wt 166.5 lb

## 2016-09-26 DIAGNOSIS — C787 Secondary malignant neoplasm of liver and intrahepatic bile duct: Secondary | ICD-10-CM

## 2016-09-26 DIAGNOSIS — C778 Secondary and unspecified malignant neoplasm of lymph nodes of multiple regions: Secondary | ICD-10-CM | POA: Diagnosis not present

## 2016-09-26 DIAGNOSIS — Z5112 Encounter for antineoplastic immunotherapy: Secondary | ICD-10-CM | POA: Diagnosis not present

## 2016-09-26 DIAGNOSIS — R748 Abnormal levels of other serum enzymes: Secondary | ICD-10-CM

## 2016-09-26 DIAGNOSIS — C50919 Malignant neoplasm of unspecified site of unspecified female breast: Secondary | ICD-10-CM | POA: Diagnosis not present

## 2016-09-26 DIAGNOSIS — G629 Polyneuropathy, unspecified: Secondary | ICD-10-CM | POA: Diagnosis not present

## 2016-09-26 DIAGNOSIS — Z5111 Encounter for antineoplastic chemotherapy: Secondary | ICD-10-CM | POA: Diagnosis not present

## 2016-09-26 LAB — CBC WITH DIFFERENTIAL/PLATELET
BASO%: 0.8 % (ref 0.0–2.0)
Basophils Absolute: 0 10*3/uL (ref 0.0–0.1)
EOS%: 0.6 % (ref 0.0–7.0)
Eosinophils Absolute: 0 10*3/uL (ref 0.0–0.5)
HCT: 36.2 % (ref 34.8–46.6)
HGB: 12.2 g/dL (ref 11.6–15.9)
LYMPH#: 1.4 10*3/uL (ref 0.9–3.3)
LYMPH%: 29.2 % (ref 14.0–49.7)
MCH: 31.7 pg (ref 25.1–34.0)
MCHC: 33.7 g/dL (ref 31.5–36.0)
MCV: 94 fL (ref 79.5–101.0)
MONO#: 0.6 10*3/uL (ref 0.1–0.9)
MONO%: 11.9 % (ref 0.0–14.0)
NEUT#: 2.8 10*3/uL (ref 1.5–6.5)
NEUT%: 57.5 % (ref 38.4–76.8)
Platelets: 194 10*3/uL (ref 145–400)
RBC: 3.85 10*6/uL (ref 3.70–5.45)
RDW: 14.5 % (ref 11.2–14.5)
WBC: 4.8 10*3/uL (ref 3.9–10.3)

## 2016-09-26 LAB — COMPREHENSIVE METABOLIC PANEL
ALT: 12 U/L (ref 0–55)
AST: 25 U/L (ref 5–34)
Albumin: 3.5 g/dL (ref 3.5–5.0)
Alkaline Phosphatase: 62 U/L (ref 40–150)
Anion Gap: 9 mEq/L (ref 3–11)
BUN: 15.3 mg/dL (ref 7.0–26.0)
CHLORIDE: 103 meq/L (ref 98–109)
CO2: 27 meq/L (ref 22–29)
CREATININE: 0.8 mg/dL (ref 0.6–1.1)
Calcium: 9.4 mg/dL (ref 8.4–10.4)
EGFR: 75 mL/min/{1.73_m2} — ABNORMAL LOW (ref >90)
Glucose: 95 mg/dl (ref 70–140)
POTASSIUM: 4 meq/L (ref 3.5–5.1)
SODIUM: 140 meq/L (ref 136–145)
Total Bilirubin: 0.61 mg/dL (ref 0.20–1.20)
Total Protein: 6.8 g/dL (ref 6.4–8.3)

## 2016-09-26 MED ORDER — ACETAMINOPHEN 325 MG PO TABS
650.0000 mg | ORAL_TABLET | Freq: Once | ORAL | Status: AC
  Administered 2016-09-26: 650 mg via ORAL

## 2016-09-26 MED ORDER — ACETAMINOPHEN 325 MG PO TABS
ORAL_TABLET | ORAL | Status: AC
  Filled 2016-09-26: qty 2

## 2016-09-26 MED ORDER — SODIUM CHLORIDE 0.9 % IV SOLN
Freq: Once | INTRAVENOUS | Status: AC
  Administered 2016-09-26: 11:00:00 via INTRAVENOUS

## 2016-09-26 MED ORDER — DIPHENHYDRAMINE HCL 25 MG PO CAPS
ORAL_CAPSULE | ORAL | Status: AC
  Filled 2016-09-26: qty 2

## 2016-09-26 MED ORDER — DIPHENHYDRAMINE HCL 25 MG PO CAPS
50.0000 mg | ORAL_CAPSULE | Freq: Once | ORAL | Status: AC
  Administered 2016-09-26: 50 mg via ORAL

## 2016-09-26 MED ORDER — HEPARIN SOD (PORK) LOCK FLUSH 100 UNIT/ML IV SOLN
500.0000 [IU] | Freq: Once | INTRAVENOUS | Status: AC | PRN
  Administered 2016-09-26: 500 [IU]
  Filled 2016-09-26: qty 5

## 2016-09-26 MED ORDER — PALONOSETRON HCL INJECTION 0.25 MG/5ML
0.2500 mg | Freq: Once | INTRAVENOUS | Status: AC
  Administered 2016-09-26: 0.25 mg via INTRAVENOUS

## 2016-09-26 MED ORDER — SODIUM CHLORIDE 0.9% FLUSH
10.0000 mL | INTRAVENOUS | Status: DC | PRN
  Administered 2016-09-26: 10 mL
  Filled 2016-09-26: qty 10

## 2016-09-26 MED ORDER — DEXAMETHASONE SODIUM PHOSPHATE 10 MG/ML IJ SOLN: 10.0000 mg | Freq: Once | INTRAMUSCULAR | Status: DC

## 2016-09-26 MED ORDER — DEXAMETHASONE SODIUM PHOSPHATE 10 MG/ML IJ SOLN
INTRAMUSCULAR | Status: AC
  Filled 2016-09-26: qty 1

## 2016-09-26 MED ORDER — PALONOSETRON HCL INJECTION 0.25 MG/5ML
INTRAVENOUS | Status: AC
  Filled 2016-09-26: qty 5

## 2016-09-26 MED ORDER — TRASTUZUMAB CHEMO 150 MG IV SOLR
6.0000 mg/kg | Freq: Once | INTRAVENOUS | Status: AC
  Administered 2016-09-26: 462 mg via INTRAVENOUS
  Filled 2016-09-26: qty 22

## 2016-09-26 MED ORDER — DOCETAXEL CHEMO INJECTION 160 MG/16ML
25.0000 mg/m2 | Freq: Once | INTRAVENOUS | Status: AC
  Administered 2016-09-26: 50 mg via INTRAVENOUS
  Filled 2016-09-26: qty 5

## 2016-09-26 NOTE — Assessment & Plan Note (Signed)
Metastatic breast cancer with innumerable liver metastases detected on ultrasound and recent MRI of the liver on 04/26/2016 (Prior history of left breast IDC T1 1 N0 stage IA 0.3 cm grade 1 tumor that was ER 38%, PR 93%, Ki-67 10%, HER-2 2+, no tissue for FISH testing, 0/1 lymph node negative, status post lumpectomy radiation and 5 years of Arimidex completed in February 2013)  PET/CT scan: 05/07/2016: Extensive metastatic involvement of both lobes of the liver, primary could be liver, phalangeal or metastatic disease, hypermetabolic thoracic lymph nodes left axillary, left supraclavicular and right CP angle lymph nodes, hypermetabolic upper abdominal lymph nodes  Liver biopsy10/07/2016: Metastatic carcinoma breast primary strong positivity for CK 7 weak focal positivity for ER, GCDFP, negative for PR, CDX 2, TTF-1, WT 1; HER-2 positive  Treatment plan: 1. Taxotere Herceptin and Perjeta every 3 weeks palliative chemotherapy followed by Herceptin and Perjeta maintenance after 6 cycles started 06/13/16 ----------------------------------------------------------------------------------------------------------------------------------------- Current Treatment: Cycle 6day 1Herceptin Perjeta, addedTaxotere with cycle 2 (today is cycle 4 Taxotere)  Chemotherapy Toxicities:  1. Diarrhea due to Perjeta:Controlled with Imodium 2. Elevated LFTs: Being monitored closely 3. Nausea after first treatment with Herceptin and Perjeta: Resolved. 4. Infusion reaction to Perjeta: We will add steroids as premedication. 5. Redness of the face 24 hours after chemotherapy. Taxotere  Goals of treatment: Palliation of symptoms and prolongation of life We'll plan on obtaining scans prior to next cycle of treatment. Return to clinic in 3weeks for cycle 7.

## 2016-09-26 NOTE — Patient Instructions (Signed)
[facility] Discharge Instructions for Patients Receiving Chemotherapy  Today you received the following chemotherapy agents :  Herceptin and Taxotere.  To help prevent nausea and vomiting after your treatment, we encourage you to take your nausea medication as prescribed.    If you develop nausea and vomiting that is not controlled by your nausea medication, call the clinic.   BELOW ARE SYMPTOMS THAT SHOULD BE REPORTED IMMEDIATELY:  *FEVER GREATER THAN 100.5 F  *CHILLS WITH OR WITHOUT FEVER  NAUSEA AND VOMITING THAT IS NOT CONTROLLED WITH YOUR NAUSEA MEDICATION  *UNUSUAL SHORTNESS OF BREATH  *UNUSUAL BRUISING OR BLEEDING  TENDERNESS IN MOUTH AND THROAT WITH OR WITHOUT PRESENCE OF ULCERS  *URINARY PROBLEMS  *BOWEL PROBLEMS  UNUSUAL RASH Items with * indicate a potential emergency and should be followed up as soon as possible.  Feel free to call the clinic you have any questions or concerns. The clinic phone number is [phone].  Please show the CHEMO ALERT CARD at check-in to the Emergency Department and triage nurse.   

## 2016-09-26 NOTE — Progress Notes (Signed)
Patient Care Team: [name], MD as PCP - General (Internal Medicine)  DIAGNOSIS:  Encounter Diagnoses  Name Primary?  . Metastases to the liver ([place]) Yes  . Metastatic breast cancer ([place])     SUMMARY OF ONCOLOGIC HISTORY:   Metastatic breast cancer ([place])   06/05/2006 Initial Diagnosis    Left breast biopsy: DCIS with apocrine features ER 30%, PR 13%      06/19/2006 Surgery    Left lumpectomy: stage I, pT1a, pN0(i)(sn), pMX, 0.3 cm IDC, grade 1, with extensive high-grade DCIS , margins negative ER 38 %, PR 93 %, Ki-67 10%, HER-2/neu 2+, no additional tissue available for FISH testing, with 0/1 left axillary lymph nodes.      07/30/2006 - 09/17/2006 Radiation Therapy    Adjuvant radiation therapy      09/24/2006 - 09/25/2011 Anti-estrogen oral therapy    Antiestrogen therapy with Arimidex 5 years      04/26/2016 Relapse/Recurrence    Innumerable hepatic lesions seen in both lobes most are 1-2 cm size somewhat confluent largest in the posterior right hepatic dome 3 x 4.6 cm      05/07/2016 PET scan    PET/CT scan: Extensive metastatic involvement of both lobes of the liver, primary could be liver, phalangeal or metastatic disease, hypermetabolic thoracic LN left axillary, left supraclavicular and right CP angle LN, hypermetabolic upper abdominal LN      05/16/2016 Initial Biopsy    Liver biopsy: Metastatic carcinoma breast primary strong positivity for CK 7 weak focal positivity for ER, GCDFP, negative for PR, CDX 2, TTF-1, WT 1; HER-2 positive      06/13/2016 -  Chemotherapy    Taxotere Herceptin and Perjeta every 3 weeks (Taxotere was held for elevated LFTs for cycles 1 and 2)        CHIEF COMPLIANT: Cycle 6 Herceptin, cycle 4 Taxotere (Perjeta discontinued due to allergy)  INTERVAL HISTORY: Monique Figueroa is a 83 year old with above-mentioned history metastatic breast cancer with extensive liver metastases who received chemotherapy with Taxotere Herceptin and  Perjeta. We had to stop Perjeta the last cycle when she developed an allergic reaction. This is in spite of giving premedication. She is here today to receive Cycle of chemotherapy. She denies any nausea vomiting. She has very mild neuropathy of the tips of her thumb and index finger.  REVIEW OF SYSTEMS:   Constitutional: Denies fevers, chills or abnormal weight loss Eyes: Denies blurriness of vision Ears, nose, mouth, throat, and face: Denies mucositis or sore throat Respiratory: Denies cough, dyspnea or wheezes Cardiovascular: Denies palpitation, chest discomfort Gastrointestinal:  Denies nausea, heartburn or change in bowel habits Skin: Denies abnormal skin rashes Lymphatics: Denies new lymphadenopathy or easy bruising Neurological:Denies numbness, tingling or new weaknesses Behavioral/Psych: Mood is stable, no new changes  Extremities: No lower extremity edema Breast:  denies any pain or lumps or nodules in either breasts All other systems were reviewed with the patient and are negative.  I have reviewed the past medical history, past surgical history, social history and family history with the patient and they are unchanged from previous note.  ALLERGIES:  is allergic to sulfa antibiotics.  MEDICATIONS:  Current Outpatient Prescriptions  Medication Sig Dispense Refill  . hydrochlorothiazide (HYDRODIURIL) 25 MG tablet Take 25 mg by mouth daily.     Marland Kitchen HYDROcodone-acetaminophen (NORCO/VICODIN) 5-325 MG tablet Take 1-2 tablets by mouth every 4 (four) hours as needed for moderate pain or severe pain. 40 tablet 0  . levothyroxine (SYNTHROID, LEVOTHROID)  125 MCG tablet Take 125 mcg by mouth daily before breakfast.     . lidocaine-prilocaine (EMLA) cream Apply to affected area once 30 g 3  . loratadine (CLARITIN) 10 MG tablet Take 10 mg by mouth daily as needed for allergies.    . metFORMIN (GLUCOPHAGE) 500 MG tablet Take 500 mg by mouth daily with breakfast.     . Multiple  Vitamins-Minerals (MULTIVITAMIN WITH MINERALS) tablet Take 1 tablet by mouth daily.    Marland Kitchen omeprazole (PRILOSEC OTC) 20 MG tablet Take 20 mg by mouth daily.    . ondansetron (ZOFRAN) 8 MG tablet Take 1 tablet (8 mg total) by mouth 2 (two) times daily as needed for refractory nausea / vomiting. 30 tablet 1  . prochlorperazine (COMPAZINE) 10 MG tablet Take 1 tablet (10 mg total) by mouth every 6 (six) hours as needed (Nausea or vomiting). 30 tablet 1  . ranitidine (ZANTAC) 150 MG tablet Take 150 mg by mouth at bedtime.    . vitamin B-12 (CYANOCOBALAMIN) 500 MCG tablet Take 500 mcg by mouth daily as needed.    . vitamin C (ASCORBIC ACID) 500 MG tablet Take 500 mg by mouth daily.     No current facility-administered medications for this visit.     PHYSICAL EXAMINATION: ECOG PERFORMANCE STATUS: 0 - Asymptomatic  Vitals:   09/26/16 0942  BP: 140/64  Pulse: 74  Resp: 17  Temp: 98.2 F (36.8 C)   Filed Weights   09/26/16 0942  Weight: 166 lb 8 oz (75.5 kg)    GENERAL:alert, no distress and comfortable SKIN: skin color, texture, turgor are normal, no rashes or significant lesions EYES: normal, Conjunctiva are pink and non-injected, sclera clear OROPHARYNX:no exudate, no erythema and lips, buccal mucosa, and tongue normal  NECK: supple, thyroid normal size, non-tender, without nodularity LYMPH:  no palpable lymphadenopathy in the cervical, axillary or inguinal LUNGS: clear to auscultation and percussion with normal breathing effort HEART: regular rate & rhythm and no murmurs and no lower extremity edema ABDOMEN:abdomen soft, non-tender and normal bowel sounds MUSCULOSKELETAL:no cyanosis of digits and no clubbing  NEURO: alert & oriented x 3 with fluent speech, Mild grade 1 neuropathy in the thumb and index finger of both hands EXTREMITIES: No lower extremity edema  LABORATORY DATA:  I have reviewed the data as listed   Chemistry      Component Value Date/Time   NA 140 09/26/2016 0901    K 4.0 09/26/2016 0901   CL 105 05/16/2016 1254   CL 104 06/09/2012 1402   CO2 27 09/26/2016 0901   BUN 15.3 09/26/2016 0901   CREATININE 0.8 09/26/2016 0901      Component Value Date/Time   CALCIUM 9.4 09/26/2016 0901   ALKPHOS 62 09/26/2016 0901   AST 25 09/26/2016 0901   ALT 12 09/26/2016 0901   BILITOT 0.61 09/26/2016 0901       Lab Results  Component Value Date   WBC 4.8 09/26/2016   HGB 12.2 09/26/2016   HCT 36.2 09/26/2016   MCV 94.0 09/26/2016   PLT 194 09/26/2016   NEUTROABS 2.8 09/26/2016    ASSESSMENT & PLAN:  Metastatic breast cancer ([place]) Metastatic breast cancer with innumerable liver metastases detected on ultrasound and recent MRI of the liver on 04/26/2016 (Prior history of left breast IDC T1 1 N0 stage IA 0.3 cm grade 1 tumor that was ER 38%, PR 93%, Ki-67 10%, HER-2 2+, no tissue for FISH testing, 0/1 lymph node negative, status post lumpectomy radiation  and 5 years of Arimidex completed in February 2013)  PET/CT scan: 05/07/2016: Extensive metastatic involvement of both lobes of the liver, primary could be liver, phalangeal or metastatic disease, hypermetabolic thoracic lymph nodes left axillary, left supraclavicular and right CP angle lymph nodes, hypermetabolic upper abdominal lymph nodes  Liver biopsy10/07/2016: Metastatic carcinoma breast primary strong positivity for CK 7 weak focal positivity for ER, GCDFP, negative for PR, CDX 2, TTF-1, WT 1; HER-2 positive  Treatment plan: 1. Taxotere Herceptin and Perjeta every 3 weeks palliative chemotherapy followed by Herceptin and Perjeta maintenance after 6 cycles started 06/13/16 ----------------------------------------------------------------------------------------------------------------------------------------- Current Treatment: Cycle 6day 1Herceptin, addedTaxotere with cycle 2 (today is cycle 4 Taxotere) Perjeta was discontinued with cycle 5 due to an allergic reaction  Chemotherapy  Toxicities:  1. Diarrhea due to Perjeta:Controlled with Imodium 2. Elevated LFTs: Being monitored closely 3. Nausea after first treatment with Herceptin and Perjeta: Resolved. 4. Infusion reaction to Perjeta: This is in spite of adding premedications. We discontinued Perjeta. 5. Redness of the face 24 hours after chemotherapy. Taxotere  Goals of treatment: Palliation of symptoms and prolongation of life We'll plan on obtaining scans prior to next cycle of treatment. Return to clinic in 3weeks for cycle 7. We may decide to discontinue Taxotere if we have excellent result on the scans.    I spent 25 minutes talking to the patient of which more than half was spent in counseling and coordination of care.  No orders of the defined types were placed in this encounter.  The patient has a good understanding of the overall plan. she agrees with it. she will call with any problems that may develop before the next visit here.   Rulon Eisenmenger, MD 09/26/16

## 2016-10-15 ENCOUNTER — Ambulatory Visit (HOSPITAL_COMMUNITY): Payer: Medicare Other

## 2016-10-16 ENCOUNTER — Ambulatory Visit (HOSPITAL_COMMUNITY)
Admission: RE | Admit: 2016-10-16 | Discharge: 2016-10-16 | Disposition: A | Discharge status: Home / Self Care | Payer: Medicare Other | Source: Ambulatory Visit | Attending: Hematology and Oncology | Admitting: Hematology and Oncology

## 2016-10-16 DIAGNOSIS — I700 Atherosclerosis of aorta: Secondary | ICD-10-CM | POA: Insufficient documentation

## 2016-10-16 DIAGNOSIS — R911 Solitary pulmonary nodule: Secondary | ICD-10-CM | POA: Insufficient documentation

## 2016-10-16 DIAGNOSIS — C787 Secondary malignant neoplasm of liver and intrahepatic bile duct: Secondary | ICD-10-CM | POA: Insufficient documentation

## 2016-10-16 DIAGNOSIS — C50919 Malignant neoplasm of unspecified site of unspecified female breast: Secondary | ICD-10-CM | POA: Insufficient documentation

## 2016-10-16 MED ORDER — IOPAMIDOL (ISOVUE-300) INJECTION 61%
INTRAVENOUS | Status: AC
  Administered 2016-10-16: 100 mL
  Filled 2016-10-16: qty 100

## 2016-10-17 ENCOUNTER — Other Ambulatory Visit: Payer: Medicare Other

## 2016-10-17 ENCOUNTER — Encounter: Payer: Self-pay | Admitting: Hematology and Oncology

## 2016-10-17 ENCOUNTER — Ambulatory Visit (HOSPITAL_BASED_OUTPATIENT_CLINIC_OR_DEPARTMENT_OTHER): Payer: Medicare Other

## 2016-10-17 ENCOUNTER — Ambulatory Visit: Payer: Medicare Other | Admitting: Hematology and Oncology

## 2016-10-17 ENCOUNTER — Ambulatory Visit: Payer: Medicare Other

## 2016-10-17 ENCOUNTER — Ambulatory Visit (HOSPITAL_BASED_OUTPATIENT_CLINIC_OR_DEPARTMENT_OTHER): Payer: Medicare Other | Admitting: Hematology and Oncology

## 2016-10-17 DIAGNOSIS — C787 Secondary malignant neoplasm of liver and intrahepatic bile duct: Secondary | ICD-10-CM

## 2016-10-17 DIAGNOSIS — C50919 Malignant neoplasm of unspecified site of unspecified female breast: Secondary | ICD-10-CM

## 2016-10-17 DIAGNOSIS — Z5112 Encounter for antineoplastic immunotherapy: Secondary | ICD-10-CM

## 2016-10-17 DIAGNOSIS — Z5111 Encounter for antineoplastic chemotherapy: Secondary | ICD-10-CM | POA: Diagnosis not present

## 2016-10-17 DIAGNOSIS — C778 Secondary and unspecified malignant neoplasm of lymph nodes of multiple regions: Secondary | ICD-10-CM

## 2016-10-17 LAB — CBC WITH DIFFERENTIAL/PLATELET
BASO%: 0.8 % (ref 0.0–2.0)
Basophils Absolute: 0 10*3/uL (ref 0.0–0.1)
EOS ABS: 0 10*3/uL (ref 0.0–0.5)
EOS%: 0.6 % (ref 0.0–7.0)
HEMATOCRIT: 35.5 % (ref 34.8–46.6)
HEMOGLOBIN: 12 g/dL (ref 11.6–15.9)
LYMPH%: 26.6 % (ref 14.0–49.7)
MCH: 32.2 pg (ref 25.1–34.0)
MCHC: 33.7 g/dL (ref 31.5–36.0)
MCV: 95.5 fL (ref 79.5–101.0)
MONO#: 0.5 10*3/uL (ref 0.1–0.9)
MONO%: 11.6 % (ref 0.0–14.0)
NEUT#: 2.6 10*3/uL (ref 1.5–6.5)
NEUT%: 60.4 % (ref 38.4–76.8)
Platelets: 194 10*3/uL (ref 145–400)
RBC: 3.72 10*6/uL (ref 3.70–5.45)
RDW: 14.8 % — AB (ref 11.2–14.5)
WBC: 4.4 10*3/uL (ref 3.9–10.3)
lymph#: 1.2 10*3/uL (ref 0.9–3.3)

## 2016-10-17 LAB — COMPREHENSIVE METABOLIC PANEL
ALBUMIN: 3.6 g/dL (ref 3.5–5.0)
ALT: 11 U/L (ref 0–55)
AST: 22 U/L (ref 5–34)
Alkaline Phosphatase: 57 U/L (ref 40–150)
Anion Gap: 8 mEq/L (ref 3–11)
BUN: 11.8 mg/dL (ref 7.0–26.0)
CALCIUM: 9.1 mg/dL (ref 8.4–10.4)
CO2: 28 mEq/L (ref 22–29)
Chloride: 105 mEq/L (ref 98–109)
Creatinine: 0.8 mg/dL (ref 0.6–1.1)
EGFR: 75 mL/min/{1.73_m2} — AB (ref >90)
GLUCOSE: 109 mg/dL (ref 70–140)
POTASSIUM: 3.8 meq/L (ref 3.5–5.1)
SODIUM: 140 meq/L (ref 136–145)
Total Bilirubin: 0.47 mg/dL (ref 0.20–1.20)
Total Protein: 6.6 g/dL (ref 6.4–8.3)

## 2016-10-17 MED ORDER — TRASTUZUMAB CHEMO 150 MG IV SOLR
6.0000 mg/kg | Freq: Once | INTRAVENOUS | Status: AC
  Administered 2016-10-17: 462 mg via INTRAVENOUS
  Filled 2016-10-17: qty 22

## 2016-10-17 MED ORDER — HEPARIN SOD (PORK) LOCK FLUSH 100 UNIT/ML IV SOLN
500.0000 [IU] | Freq: Once | INTRAVENOUS | Status: AC | PRN
  Administered 2016-10-17: 500 [IU]
  Filled 2016-10-17: qty 5

## 2016-10-17 MED ORDER — ACETAMINOPHEN 325 MG PO TABS
ORAL_TABLET | ORAL | Status: AC
  Filled 2016-10-17: qty 2

## 2016-10-17 MED ORDER — DOCETAXEL CHEMO INJECTION 160 MG/16ML
25.0000 mg/m2 | Freq: Once | INTRAVENOUS | Status: AC
  Administered 2016-10-17: 50 mg via INTRAVENOUS
  Filled 2016-10-17: qty 5

## 2016-10-17 MED ORDER — SODIUM CHLORIDE 0.9% FLUSH
10.0000 mL | INTRAVENOUS | Status: DC | PRN
  Administered 2016-10-17: 10 mL
  Filled 2016-10-17: qty 10

## 2016-10-17 MED ORDER — SODIUM CHLORIDE 0.9 % IV SOLN
Freq: Once | INTRAVENOUS | Status: AC
  Administered 2016-10-17: 12:00:00 via INTRAVENOUS

## 2016-10-17 MED ORDER — DIPHENHYDRAMINE HCL 25 MG PO CAPS
50.0000 mg | ORAL_CAPSULE | Freq: Once | ORAL | Status: AC
  Administered 2016-10-17: 50 mg via ORAL

## 2016-10-17 MED ORDER — PALONOSETRON HCL INJECTION 0.25 MG/5ML
INTRAVENOUS | Status: AC
  Filled 2016-10-17: qty 5

## 2016-10-17 MED ORDER — DEXAMETHASONE SODIUM PHOSPHATE 10 MG/ML IJ SOLN
INTRAMUSCULAR | Status: AC
  Filled 2016-10-17: qty 1

## 2016-10-17 MED ORDER — DEXAMETHASONE SODIUM PHOSPHATE 10 MG/ML IJ SOLN
2.0000 mg | Freq: Once | INTRAMUSCULAR | Status: AC
  Administered 2016-10-17: 2 mg via INTRAVENOUS

## 2016-10-17 MED ORDER — DIPHENHYDRAMINE HCL 25 MG PO CAPS
ORAL_CAPSULE | ORAL | Status: AC
  Filled 2016-10-17: qty 2

## 2016-10-17 MED ORDER — PALONOSETRON HCL INJECTION 0.25 MG/5ML
0.2500 mg | Freq: Once | INTRAVENOUS | Status: AC
  Administered 2016-10-17: 0.25 mg via INTRAVENOUS

## 2016-10-17 MED ORDER — ACETAMINOPHEN 325 MG PO TABS
650.0000 mg | ORAL_TABLET | Freq: Once | ORAL | Status: AC
  Administered 2016-10-17: 650 mg via ORAL

## 2016-10-17 NOTE — Assessment & Plan Note (Signed)
Metastatic breast cancer with innumerable liver metastases detected on ultrasound and recent MRI of the liver on 04/26/2016 (Prior history of left breast IDC T1 1 N0 stage IA 0.3 cm grade 1 tumor that was ER 38%, PR 93%, Ki-67 10%, HER-2 2+, no tissue for FISH testing, 0/1 lymph node negative, status post lumpectomy radiation and 5 years of Arimidex completed in February 2013)  PET/CT scan: 05/07/2016: Extensive metastatic involvement of both lobes of the liver, primary could be liver, phalangeal or metastatic disease, hypermetabolic thoracic lymph nodes left axillary, left supraclavicular and right CP angle lymph nodes, hypermetabolic upper abdominal lymph nodes  Liver biopsy10/07/2016: Metastatic carcinoma breast primary strong positivity for CK 7 weak focal positivity for ER, GCDFP, negative for PR, CDX 2, TTF-1, WT 1; HER-2 positive  Treatment plan: 1. Taxotere Herceptin and Perjeta every 3 weeks palliative chemotherapy followed by Herceptin and Perjeta maintenance after 6 cycles started 06/13/16 ----------------------------------------------------------------------------------------------------------------------------------------- Current Treatment: Cycle 7day 1Herceptin, addedTaxotere with cycle 2 (completed 4 cycles of Taxotere) Perjeta was discontinued with cycle 5 due to an allergic reaction  Chemotherapy Toxicities:  1. Diarrhea due to Perjeta:Controlled with Imodium 2. Elevated LFTs: Being monitored closely 3. Nausea after first treatment with Herceptin and Perjeta: Resolved. 4. Infusion reaction to Perjeta: This is in spite of adding premedications. We discontinued Perjeta. 5. Redness of the face 24 hours after chemotherapy. Taxotere  10/16/2016:CT CAP:  no evidence of lymphadenopathy in the chest. Previous lymph nodes appear resolved and others are stable, decrease in the bulk of the liver metastases and resolution of the abdominal lymphadenopathy.   Goals of treatment:  Palliation of symptoms and prolongation of life   Return to clinic in 3weeks for cycle 7 Herceptin . We discontinued Taxotere

## 2016-10-17 NOTE — Progress Notes (Signed)
Patient Care Team: [name], MD as PCP - General (Internal Medicine)  DIAGNOSIS:  Encounter Diagnosis  Name Primary?  . Metastatic breast cancer ([place])     SUMMARY OF ONCOLOGIC HISTORY:   Metastatic breast cancer ([place])   06/05/2006 Initial Diagnosis    Left breast biopsy: DCIS with apocrine features ER 30%, PR 13%      06/19/2006 Surgery    Left lumpectomy: stage I, pT1a, pN0(i)(sn), pMX, 0.3 cm IDC, grade 1, with extensive high-grade DCIS , margins negative ER 38 %, PR 93 %, Ki-67 10%, HER-2/neu 2+, no additional tissue available for FISH testing, with 0/1 left axillary lymph nodes.      07/30/2006 - 09/17/2006 Radiation Therapy    Adjuvant radiation therapy      09/24/2006 - 09/25/2011 Anti-estrogen oral therapy    Antiestrogen therapy with Arimidex 5 years      04/26/2016 Relapse/Recurrence    Innumerable hepatic lesions seen in both lobes most are 1-2 cm size somewhat confluent largest in the posterior right hepatic dome 3 x 4.6 cm      05/07/2016 PET scan    PET/CT scan: Extensive metastatic involvement of both lobes of the liver, primary could be liver, phalangeal or metastatic disease, hypermetabolic thoracic LN left axillary, left supraclavicular and right CP angle LN, hypermetabolic upper abdominal LN      05/16/2016 Initial Biopsy    Liver biopsy: Metastatic carcinoma breast primary strong positivity for CK 7 weak focal positivity for ER, GCDFP, negative for PR, CDX 2, TTF-1, WT 1; HER-2 positive      06/13/2016 -  Chemotherapy    Taxotere Herceptin and Perjeta every 3 weeks (Taxotere was held for elevated LFTs for cycles 1 and 2)        CHIEF COMPLIANT: Cycle 5 Taxotere  INTERVAL HISTORY: Monique Figueroa is a 83 year old with above-mentioned history of metastatic breast cancer who is on Taxotere Herceptin. She had a recent scan and is here to discuss the results. She is tolerated chemotherapy extremely well. She could not tolerate Perjeta. We had to  discontinue Perjeta because of allergy reactions. The recent scan suggested dramatic improvement in the metastatic disease.   REVIEW OF SYSTEMS:   Constitutional: Denies fevers, chills or abnormal weight loss, complains of fatigue Eyes: Denies blurriness of vision Ears, nose, mouth, throat, and face: Denies mucositis or sore throat Respiratory: Denies cough, dyspnea or wheezes Cardiovascular: Denies palpitation, chest discomfort Gastrointestinal:  Denies nausea, heartburn or change in bowel habits Skin: Denies abnormal skin rashes Lymphatics: Denies new lymphadenopathy or easy bruising Neurological:Denies numbness, tingling or new weaknesses Behavioral/Psych: Mood is stable, no new changes  Extremities: No lower extremity edema All other systems were reviewed with the patient and are negative.  I have reviewed the past medical history, past surgical history, social history and family history with the patient and they are unchanged from previous note.  ALLERGIES:  is allergic to sulfa antibiotics.  MEDICATIONS:  Current Outpatient Prescriptions  Medication Sig Dispense Refill  . hydrochlorothiazide (HYDRODIURIL) 25 MG tablet Take 25 mg by mouth daily.     Marland Kitchen HYDROcodone-acetaminophen (NORCO/VICODIN) 5-325 MG tablet Take 1-2 tablets by mouth every 4 (four) hours as needed for moderate pain or severe pain. 40 tablet 0  . levothyroxine (SYNTHROID, LEVOTHROID) 125 MCG tablet Take 125 mcg by mouth daily before breakfast.     . lidocaine-prilocaine (EMLA) cream Apply to affected area once 30 g 3  . loratadine (CLARITIN) 10 MG tablet Take 10 mg  by mouth daily as needed for allergies.    . metFORMIN (GLUCOPHAGE) 500 MG tablet Take 500 mg by mouth daily with breakfast.     . Multiple Vitamins-Minerals (MULTIVITAMIN WITH MINERALS) tablet Take 1 tablet by mouth daily.    Marland Kitchen omeprazole (PRILOSEC OTC) 20 MG tablet Take 20 mg by mouth daily.    . ondansetron (ZOFRAN) 8 MG tablet Take 1 tablet (8 mg  total) by mouth 2 (two) times daily as needed for refractory nausea / vomiting. 30 tablet 1  . prochlorperazine (COMPAZINE) 10 MG tablet Take 1 tablet (10 mg total) by mouth every 6 (six) hours as needed (Nausea or vomiting). 30 tablet 1  . ranitidine (ZANTAC) 150 MG tablet Take 150 mg by mouth at bedtime.    . vitamin B-12 (CYANOCOBALAMIN) 500 MCG tablet Take 500 mcg by mouth daily as needed.    . vitamin C (ASCORBIC ACID) 500 MG tablet Take 500 mg by mouth daily.     No current facility-administered medications for this visit.    Facility-Administered Medications Ordered in Other Visits  Medication Dose Route Frequency Provider Last Rate Last Dose  . 0.9 %  sodium chloride infusion   Intravenous Once Nicholas Lose, MD      . acetaminophen (TYLENOL) tablet 650 mg  650 mg Oral Once Nicholas Lose, MD      . dexamethasone (DECADRON) injection 2 mg  2 mg Intravenous Once Nicholas Lose, MD      . diphenhydrAMINE (BENADRYL) capsule 50 mg  50 mg Oral Once Nicholas Lose, MD      . DOCEtaxel (TAXOTERE) 50 mg in dextrose 5 % 150 mL chemo infusion  25 mg/m2 (Treatment Plan Recorded) Intravenous Once Nicholas Lose, MD      . heparin lock flush 100 unit/mL  500 Units Intracatheter Once PRN Nicholas Lose, MD      . palonosetron (ALOXI) injection 0.25 mg  0.25 mg Intravenous Once Nicholas Lose, MD      . sodium chloride flush (NS) 0.9 % injection 10 mL  10 mL Intracatheter PRN Nicholas Lose, MD      . trastuzumab (HERCEPTIN) 462 mg in sodium chloride 0.9 % 250 mL chemo infusion  6 mg/kg (Treatment Plan Recorded) Intravenous Once Nicholas Lose, MD        PHYSICAL EXAMINATION: ECOG PERFORMANCE STATUS: 1 - Symptomatic but completely ambulatory  Vitals:   10/17/16 1032  BP: (!) 156/72  Pulse: 82  Resp: 18  Temp: 97.5 F (36.4 C)   Filed Weights   10/17/16 1032  Weight: 169 lb 6 oz (76.8 kg)    GENERAL:alert, no distress and comfortable SKIN: skin color, texture, turgor are normal, no rashes or significant  lesions EYES: normal, Conjunctiva are pink and non-injected, sclera clear OROPHARYNX:no exudate, no erythema and lips, buccal mucosa, and tongue normal  NECK: supple, thyroid normal size, non-tender, without nodularity LYMPH:  no palpable lymphadenopathy in the cervical, axillary or inguinal LUNGS: clear to auscultation and percussion with normal breathing effort HEART: regular rate & rhythm and no murmurs and no lower extremity edema ABDOMEN:abdomen soft, non-tender and normal bowel sounds MUSCULOSKELETAL:no cyanosis of digits and no clubbing  NEURO: alert & oriented x 3 with fluent speech, no focal motor/sensory deficits EXTREMITIES: No lower extremity edema  LABORATORY DATA:  I have reviewed the data as listed   Chemistry      Component Value Date/Time   NA 140 10/17/2016 1022   K 3.8 10/17/2016 1022   CL 105 05/16/2016  1254   CL 104 06/09/2012 1402   CO2 28 10/17/2016 1022   BUN 11.8 10/17/2016 1022   CREATININE 0.8 10/17/2016 1022      Component Value Date/Time   CALCIUM 9.1 10/17/2016 1022   ALKPHOS 57 10/17/2016 1022   AST 22 10/17/2016 1022   ALT 11 10/17/2016 1022   BILITOT 0.47 10/17/2016 1022       Lab Results  Component Value Date   WBC 4.4 10/17/2016   HGB 12.0 10/17/2016   HCT 35.5 10/17/2016   MCV 95.5 10/17/2016   PLT 194 10/17/2016   NEUTROABS 2.6 10/17/2016    ASSESSMENT & PLAN:  Metastatic breast cancer ([place]) Metastatic breast cancer with innumerable liver metastases detected on ultrasound and recent MRI of the liver on 04/26/2016 (Prior history of left breast IDC T1 1 N0 stage IA 0.3 cm grade 1 tumor that was ER 38%, PR 93%, Ki-67 10%, HER-2 2+, no tissue for FISH testing, 0/1 lymph node negative, status post lumpectomy radiation and 5 years of Arimidex completed in February 2013)  PET/CT scan: 05/07/2016: Extensive metastatic involvement of both lobes of the liver, primary could be liver, phalangeal or metastatic disease, hypermetabolic thoracic  lymph nodes left axillary, left supraclavicular and right CP angle lymph nodes, hypermetabolic upper abdominal lymph nodes  Liver biopsy10/07/2016: Metastatic carcinoma breast primary strong positivity for CK 7 weak focal positivity for ER, GCDFP, negative for PR, CDX 2, TTF-1, WT 1; HER-2 positive  Treatment plan: 1. Taxotere Herceptin and Perjeta every 3 weeks palliative chemotherapy followed by Herceptin and Perjeta maintenance after 6 cycles started 06/13/16 ----------------------------------------------------------------------------------------------------------------------------------------- Current Treatment: Cycle 7day 1Herceptin, addedTaxotere with cycle 2 (completed 4 cycles of Taxotere) Perjeta was discontinued with cycle 5 due to an allergic reaction  Chemotherapy Toxicities:  1. Diarrhea due to Perjeta:Controlled with Imodium 2. Elevated LFTs: Being monitored closely 3. Nausea after first treatment with Herceptin and Perjeta: Resolved. 4. Infusion reaction to Perjeta: This is in spite of adding premedications. We discontinued Perjeta. 5. Redness of the face 24 hours after chemotherapy. Taxotere  10/16/2016:CT CAP:  no evidence of lymphadenopathy in the chest. Previous lymph nodes appear resolved and others are stable, decrease in the bulk of the liver metastases and resolution of the abdominal lymphadenopathy.   Goals of treatment: Palliation of symptoms and prolongation of life   Return to clinic in 3weeks for cycle 7 Herceptin and Taxotere .  we will continue Taxotere for 2 more cycles and then discontinue Taxotere and keep her on maintenance Herceptin.  I spent 25 minutes talking to the patient of which more than half was spent in counseling and coordination of care.  No orders of the defined types were placed in this encounter.  The patient has a good understanding of the overall plan. she agrees with it. she will call with any problems that may develop before  the next visit here.   Rulon Eisenmenger, MD 10/17/16

## 2016-10-17 NOTE — Patient Instructions (Signed)
[facility] Discharge Instructions for Patients Receiving Chemotherapy  Today you received the following chemotherapy agents :  Herceptin and Taxotere.  To help prevent nausea and vomiting after your treatment, we encourage you to take your nausea medication as prescribed.    If you develop nausea and vomiting that is not controlled by your nausea medication, call the clinic.   BELOW ARE SYMPTOMS THAT SHOULD BE REPORTED IMMEDIATELY:  *FEVER GREATER THAN 100.5 F  *CHILLS WITH OR WITHOUT FEVER  NAUSEA AND VOMITING THAT IS NOT CONTROLLED WITH YOUR NAUSEA MEDICATION  *UNUSUAL SHORTNESS OF BREATH  *UNUSUAL BRUISING OR BLEEDING  TENDERNESS IN MOUTH AND THROAT WITH OR WITHOUT PRESENCE OF ULCERS  *URINARY PROBLEMS  *BOWEL PROBLEMS  UNUSUAL RASH Items with * indicate a potential emergency and should be followed up as soon as possible.  Feel free to call the clinic you have any questions or concerns. The clinic phone number is [phone].  Please show the [place] at check-in to the Emergency Department and triage nurse.

## 2016-11-07 ENCOUNTER — Ambulatory Visit (HOSPITAL_BASED_OUTPATIENT_CLINIC_OR_DEPARTMENT_OTHER): Payer: Medicare Other | Admitting: Adult Health

## 2016-11-07 ENCOUNTER — Ambulatory Visit (HOSPITAL_BASED_OUTPATIENT_CLINIC_OR_DEPARTMENT_OTHER): Payer: Medicare Other

## 2016-11-07 ENCOUNTER — Other Ambulatory Visit: Payer: Medicare Other

## 2016-11-07 ENCOUNTER — Ambulatory Visit: Payer: Medicare Other

## 2016-11-07 ENCOUNTER — Encounter: Payer: Self-pay | Admitting: Adult Health

## 2016-11-07 ENCOUNTER — Other Ambulatory Visit (HOSPITAL_BASED_OUTPATIENT_CLINIC_OR_DEPARTMENT_OTHER): Payer: Medicare Other

## 2016-11-07 VITALS — BP 136/65 | HR 86 | Temp 97.7°F | Resp 18 | Wt 168.8 lb

## 2016-11-07 DIAGNOSIS — C787 Secondary malignant neoplasm of liver and intrahepatic bile duct: Secondary | ICD-10-CM

## 2016-11-07 DIAGNOSIS — Z5112 Encounter for antineoplastic immunotherapy: Secondary | ICD-10-CM | POA: Diagnosis not present

## 2016-11-07 DIAGNOSIS — C50919 Malignant neoplasm of unspecified site of unspecified female breast: Secondary | ICD-10-CM

## 2016-11-07 DIAGNOSIS — M7989 Other specified soft tissue disorders: Secondary | ICD-10-CM

## 2016-11-07 DIAGNOSIS — Z5111 Encounter for antineoplastic chemotherapy: Secondary | ICD-10-CM

## 2016-11-07 DIAGNOSIS — G620 Drug-induced polyneuropathy: Secondary | ICD-10-CM | POA: Diagnosis not present

## 2016-11-07 DIAGNOSIS — C778 Secondary and unspecified malignant neoplasm of lymph nodes of multiple regions: Secondary | ICD-10-CM

## 2016-11-07 DIAGNOSIS — I8393 Asymptomatic varicose veins of bilateral lower extremities: Secondary | ICD-10-CM

## 2016-11-07 LAB — CBC WITH DIFFERENTIAL/PLATELET
BASO%: 1 % (ref 0.0–2.0)
BASOS ABS: 0.1 10*3/uL (ref 0.0–0.1)
EOS ABS: 0 10*3/uL (ref 0.0–0.5)
EOS%: 0.5 % (ref 0.0–7.0)
HEMATOCRIT: 37.4 % (ref 34.8–46.6)
HEMOGLOBIN: 12.7 g/dL (ref 11.6–15.9)
LYMPH#: 1.5 10*3/uL (ref 0.9–3.3)
LYMPH%: 30.4 % (ref 14.0–49.7)
MCH: 31.9 pg (ref 25.1–34.0)
MCHC: 33.9 g/dL (ref 31.5–36.0)
MCV: 94.2 fL (ref 79.5–101.0)
MONO#: 0.6 10*3/uL (ref 0.1–0.9)
MONO%: 11.8 % (ref 0.0–14.0)
NEUT#: 2.8 10*3/uL (ref 1.5–6.5)
NEUT%: 56.3 % (ref 38.4–76.8)
Platelets: 215 10*3/uL (ref 145–400)
RBC: 3.97 10*6/uL (ref 3.70–5.45)
RDW: 14.2 % (ref 11.2–14.5)
WBC: 5 10*3/uL (ref 3.9–10.3)

## 2016-11-07 LAB — COMPREHENSIVE METABOLIC PANEL
ALBUMIN: 3.9 g/dL (ref 3.5–5.0)
ALK PHOS: 59 U/L (ref 40–150)
ALT: 12 U/L (ref 0–55)
AST: 25 U/L (ref 5–34)
Anion Gap: 11 mEq/L (ref 3–11)
BILIRUBIN TOTAL: 0.66 mg/dL (ref 0.20–1.20)
BUN: 13.9 mg/dL (ref 7.0–26.0)
CO2: 26 mEq/L (ref 22–29)
Calcium: 9.5 mg/dL (ref 8.4–10.4)
Chloride: 102 mEq/L (ref 98–109)
Creatinine: 0.8 mg/dL (ref 0.6–1.1)
EGFR: 69 mL/min/{1.73_m2} — AB (ref >90)
GLUCOSE: 133 mg/dL (ref 70–140)
Potassium: 3.7 mEq/L (ref 3.5–5.1)
Sodium: 139 mEq/L (ref 136–145)
TOTAL PROTEIN: 7.1 g/dL (ref 6.4–8.3)

## 2016-11-07 MED ORDER — ACETAMINOPHEN 325 MG PO TABS
650.0000 mg | ORAL_TABLET | Freq: Once | ORAL | Status: AC
  Administered 2016-11-07: 650 mg via ORAL

## 2016-11-07 MED ORDER — SODIUM CHLORIDE 0.9% FLUSH
10.0000 mL | INTRAVENOUS | Status: DC | PRN
Start: 2016-11-07 — End: 2016-11-07
  Administered 2016-11-07: 10 mL
  Filled 2016-11-07: qty 10

## 2016-11-07 MED ORDER — HEPARIN SOD (PORK) LOCK FLUSH 100 UNIT/ML IV SOLN
500.0000 [IU] | Freq: Once | INTRAVENOUS | Status: AC | PRN
  Administered 2016-11-07: 500 [IU]
  Filled 2016-11-07: qty 5

## 2016-11-07 MED ORDER — DEXAMETHASONE SODIUM PHOSPHATE 10 MG/ML IJ SOLN
INTRAMUSCULAR | Status: AC
  Filled 2016-11-07: qty 1

## 2016-11-07 MED ORDER — DOCETAXEL CHEMO INJECTION 160 MG/16ML
25.0000 mg/m2 | Freq: Once | INTRAVENOUS | Status: AC
  Administered 2016-11-07: 50 mg via INTRAVENOUS
  Filled 2016-11-07: qty 5

## 2016-11-07 MED ORDER — SODIUM CHLORIDE 0.9 % IV SOLN
6.0000 mg/kg | Freq: Once | INTRAVENOUS | Status: AC
  Administered 2016-11-07: 462 mg via INTRAVENOUS
  Filled 2016-11-07: qty 22

## 2016-11-07 MED ORDER — SODIUM CHLORIDE 0.9 % IV SOLN
Freq: Once | INTRAVENOUS | Status: AC
  Administered 2016-11-07: 14:00:00 via INTRAVENOUS

## 2016-11-07 MED ORDER — ACETAMINOPHEN 325 MG PO TABS
ORAL_TABLET | ORAL | Status: AC
  Filled 2016-11-07: qty 2

## 2016-11-07 MED ORDER — DEXAMETHASONE SODIUM PHOSPHATE 10 MG/ML IJ SOLN
2.0000 mg | Freq: Once | INTRAMUSCULAR | Status: AC
  Administered 2016-11-07: 2 mg via INTRAVENOUS

## 2016-11-07 MED ORDER — PALONOSETRON HCL INJECTION 0.25 MG/5ML
INTRAVENOUS | Status: AC
  Filled 2016-11-07: qty 5

## 2016-11-07 MED ORDER — PALONOSETRON HCL INJECTION 0.25 MG/5ML
0.2500 mg | Freq: Once | INTRAVENOUS | Status: AC
  Administered 2016-11-07: 0.25 mg via INTRAVENOUS

## 2016-11-07 MED ORDER — DIPHENHYDRAMINE HCL 25 MG PO CAPS
ORAL_CAPSULE | ORAL | Status: AC
  Filled 2016-11-07: qty 2

## 2016-11-07 MED ORDER — DIPHENHYDRAMINE HCL 25 MG PO CAPS
50.0000 mg | ORAL_CAPSULE | Freq: Once | ORAL | Status: AC
  Administered 2016-11-07: 50 mg via ORAL

## 2016-11-07 NOTE — Progress Notes (Signed)
Patient Care Team: [name], MD as PCP - General (Internal Medicine)  DIAGNOSIS:  No diagnosis found.  SUMMARY OF ONCOLOGIC HISTORY:   Metastatic breast cancer ([place])   06/05/2006 Initial Diagnosis    Left breast biopsy: DCIS with apocrine features ER 30%, PR 13%      06/19/2006 Surgery    Left lumpectomy: stage I, pT1a, pN0(i)(sn), pMX, 0.3 cm IDC, grade 1, with extensive high-grade DCIS , margins negative ER 38 %, PR 93 %, Ki-67 10%, HER-2/neu 2+, no additional tissue available for FISH testing, with 0/1 left axillary lymph nodes.      07/30/2006 - 09/17/2006 Radiation Therapy    Adjuvant radiation therapy      09/24/2006 - 09/25/2011 Anti-estrogen oral therapy    Antiestrogen therapy with Arimidex 5 years      04/26/2016 Relapse/Recurrence    Innumerable hepatic lesions seen in both lobes most are 1-2 cm size somewhat confluent largest in the posterior right hepatic dome 3 x 4.6 cm      05/07/2016 PET scan    PET/CT scan: Extensive metastatic involvement of both lobes of the liver, primary could be liver, phalangeal or metastatic disease, hypermetabolic thoracic LN left axillary, left supraclavicular and right CP angle LN, hypermetabolic upper abdominal LN      05/16/2016 Initial Biopsy    Liver biopsy: Metastatic carcinoma breast primary strong positivity for CK 7 weak focal positivity for ER, GCDFP, negative for PR, CDX 2, TTF-1, WT 1; HER-2 positive      06/13/2016 -  Chemotherapy    Taxotere Herceptin and Perjeta every 3 weeks (Taxotere was held for elevated LFTs for cycles 1 and 2)        CHIEF COMPLIANT: Cycle 6 Taxotere  INTERVAL HISTORY: Monique Figueroa is a 83 year old with above-mentioned history of metastatic breast cancer who is on Taxotere Herceptin. She is doing well today.  She does have very mild intermittent tingling in the first three digits of her fingertips bilaterally.  No other issues such as nausea, vomiting, skin peeling.    REVIEW OF  SYSTEMS:   Review of Systems  Constitutional: Positive for malaise/fatigue. Negative for chills, fever and weight loss.  HENT: Negative for hearing loss and tinnitus.   Eyes: Negative for blurred vision, double vision, pain and discharge.  Respiratory: Negative for cough and shortness of breath.   Cardiovascular: Negative for chest pain, palpitations and leg swelling.  Gastrointestinal: Negative for abdominal pain, constipation, diarrhea, heartburn, nausea and vomiting.  Genitourinary: Negative for dysuria and urgency.  Musculoskeletal: Negative for back pain, joint pain, myalgias and neck pain.  Skin: Negative for rash.  Neurological: Negative for dizziness, weakness and headaches.  Psychiatric/Behavioral: Negative for depression. The patient is not nervous/anxious.    I have reviewed the past medical history, past surgical history, social history and family history with the patient and they are unchanged from previous note.  ALLERGIES:  is allergic to sulfa antibiotics.  MEDICATIONS:  Current Outpatient Prescriptions  Medication Sig Dispense Refill  . hydrochlorothiazide (HYDRODIURIL) 25 MG tablet Take 25 mg by mouth daily.     Marland Kitchen HYDROcodone-acetaminophen (NORCO/VICODIN) 5-325 MG tablet Take 1-2 tablets by mouth every 4 (four) hours as needed for moderate pain or severe pain. 40 tablet 0  . levothyroxine (SYNTHROID, LEVOTHROID) 125 MCG tablet Take 125 mcg by mouth daily before breakfast.     . lidocaine-prilocaine (EMLA) cream Apply to affected area once 30 g 3  . loratadine (CLARITIN) 10 MG tablet Take 10  mg by mouth daily as needed for allergies.    . metFORMIN (GLUCOPHAGE) 500 MG tablet Take 500 mg by mouth daily with breakfast.     . Multiple Vitamins-Minerals (MULTIVITAMIN WITH MINERALS) tablet Take 1 tablet by mouth daily.    Marland Kitchen omeprazole (PRILOSEC OTC) 20 MG tablet Take 20 mg by mouth daily.    . ondansetron (ZOFRAN) 8 MG tablet Take 1 tablet (8 mg total) by mouth 2 (two) times  daily as needed for refractory nausea / vomiting. 30 tablet 1  . prochlorperazine (COMPAZINE) 10 MG tablet Take 1 tablet (10 mg total) by mouth every 6 (six) hours as needed (Nausea or vomiting). 30 tablet 1  . ranitidine (ZANTAC) 150 MG tablet Take 150 mg by mouth at bedtime.    . vitamin B-12 (CYANOCOBALAMIN) 500 MCG tablet Take 500 mcg by mouth daily as needed.    . vitamin C (ASCORBIC ACID) 500 MG tablet Take 500 mg by mouth daily.     No current facility-administered medications for this visit.     PHYSICAL EXAMINATION: ECOG PERFORMANCE STATUS: 1 - Symptomatic but completely ambulatory  Vitals:   11/07/16 1310  BP: 136/65  Pulse: 86  Resp: 18  Temp: 97.7 F (36.5 C)   Filed Weights   11/07/16 1310  Weight: 168 lb 12.8 oz (76.6 kg)   GENERAL: Patient is a well appearing female in no acute distress HEENT:  Sclerae anicteric.  PERRL, Oropharynx clear and moist. No ulcerations or evidence of oropharyngeal candidiasis. Neck is supple.  NODES:  No cervical, supraclavicular, or axillary lymphadenopathy palpated.  BREAST EXAM:  Deferred. LUNGS:  Clear to auscultation bilaterally.  No wheezes or rhonchi. HEART:  Regular rate and rhythm. No murmur appreciated. ABDOMEN:  Soft, nontender.  Positive, normoactive bowel sounds. No organomegaly palpated. MSK:  No focal spinal tenderness to palpation. Full range of motion bilaterally in the upper extremities. EXTREMITIES:  No peripheral edema.   SKIN:  Clear with no obvious rashes or skin changes. No nail dyscrasia. NEURO:  Nonfocal. Well oriented.  Appropriate affect.    LABORATORY DATA:  I have reviewed the data as listed   Chemistry      Component Value Date/Time   NA 139 11/07/2016 1248   K 3.7 11/07/2016 1248   CL 105 05/16/2016 1254   CL 104 06/09/2012 1402   CO2 26 11/07/2016 1248   BUN 13.9 11/07/2016 1248   CREATININE 0.8 11/07/2016 1248      Component Value Date/Time   CALCIUM 9.5 11/07/2016 1248   ALKPHOS 59  11/07/2016 1248   AST 25 11/07/2016 1248   ALT 12 11/07/2016 1248   BILITOT 0.66 11/07/2016 1248       Lab Results  Component Value Date   WBC 5.0 11/07/2016   HGB 12.7 11/07/2016   HCT 37.4 11/07/2016   MCV 94.2 11/07/2016   PLT 215 11/07/2016   NEUTROABS 2.8 11/07/2016    ASSESSMENT & PLAN:  Metastatic breast cancer ([place]) Metastatic breast cancer with innumerable liver metastases detected on ultrasound and recent MRI of the liver on 04/26/2016 (Prior history of left breast IDC T1 1 N0 stage IA 0.3 cm grade 1 tumor that was ER 38%, PR 93%, Ki-67 10%, HER-2 2+, no tissue for FISH testing, 0/1 lymph node negative, status post lumpectomy radiation and 5 years of Arimidex completed in February 2013)  PET/CT scan: 05/07/2016: Extensive metastatic involvement of both lobes of the liver, primary could be liver, phalangeal or metastatic disease, hypermetabolic  thoracic lymph nodes left axillary, left supraclavicular and right CP angle lymph nodes, hypermetabolic upper abdominal lymph nodes  Liver biopsy10/07/2016: Metastatic carcinoma breast primary strong positivity for CK 7 weak focal positivity for ER, GCDFP, negative for PR, CDX 2, TTF-1, WT 1; HER-2 positive  Treatment plan: 1. Taxotere Herceptin and Perjeta every 3 weeks palliative chemotherapy followed by Herceptin and Perjeta maintenance after 6 cycles started 06/13/16 (Perjeta discontinued after 5 cycles due to allergy) ----------------------------------------------------------------------------------------------------------------------------------------- Current Treatment: Cycle 7day 1Herceptin, addedTaxotere with cycle 2 (completed 4 cycles of Taxotere) Perjeta was discontinued with cycle 5 due to an allergic reaction  Chemotherapy Toxicities:  1. Diarrhea due to Perjeta:Perjeta has been discontinued 2. Elevated LFTs: Being monitored closely, normal today 3. Nausea after first treatment with Herceptin and Perjeta:  Resolved. 4. Infusion reaction to Perjeta: This is in spite of adding premedications. We discontinued Perjeta. No longer an issue 5. Redness of the face 24 hours after chemotherapy.  6. Peripheral neuropathy: Intermittent right now, may need to d/c taxotere if continues or worsens.  I counseled her on b complex vitamin, vitamin e and doing finger exercises.    10/16/2016:CT CAP:  no evidence of lymphadenopathy in the chest. Previous lymph nodes appear resolved and others are stable, decrease in the bulk of the liver metastases and resolution of the abdominal lymphadenopathy.   Goals of treatment: Palliation of symptoms and prolongation of life   Lua is doing well today.  She will proceed with chemotherapy today.  Her labs are stable and I reviewed those with her in detail.    I spent 25 minutes talking to the patient of which more than half was spent in counseling and coordination of care.   The patient has a good understanding of the overall plan. she agrees with it. she will call with any problems that may develop before the next visit here.   Scot Dock, NP 11/07/16

## 2016-11-07 NOTE — Patient Instructions (Signed)
[facility] Discharge Instructions for Patients Receiving Chemotherapy  Today you received the following chemotherapy agents Herceptin/Taxotere To help prevent nausea and vomiting after your treatment, we encourage you to take your nausea medication as prescribed.   If you develop nausea and vomiting that is not controlled by your nausea medication, call the clinic.   BELOW ARE SYMPTOMS THAT SHOULD BE REPORTED IMMEDIATELY:  *FEVER GREATER THAN 100.5 F  *CHILLS WITH OR WITHOUT FEVER  NAUSEA AND VOMITING THAT IS NOT CONTROLLED WITH YOUR NAUSEA MEDICATION  *UNUSUAL SHORTNESS OF BREATH  *UNUSUAL BRUISING OR BLEEDING  TENDERNESS IN MOUTH AND THROAT WITH OR WITHOUT PRESENCE OF ULCERS  *URINARY PROBLEMS  *BOWEL PROBLEMS  UNUSUAL RASH Items with * indicate a potential emergency and should be followed up as soon as possible.  Feel free to call the clinic you have any questions or concerns. The clinic phone number is [phone].  Please show the [place] at check-in to the Emergency Department and triage nurse.

## 2016-11-08 ENCOUNTER — Ambulatory Visit (HOSPITAL_COMMUNITY)
Admission: RE | Admit: 2016-11-08 | Discharge: 2016-11-08 | Disposition: A | Discharge status: Home / Self Care | Payer: Medicare Other | Source: Ambulatory Visit | Attending: Adult Health | Admitting: Adult Health

## 2016-11-08 DIAGNOSIS — M7989 Other specified soft tissue disorders: Secondary | ICD-10-CM

## 2016-11-08 DIAGNOSIS — I8393 Asymptomatic varicose veins of bilateral lower extremities: Secondary | ICD-10-CM

## 2016-11-08 DIAGNOSIS — C50919 Malignant neoplasm of unspecified site of unspecified female breast: Secondary | ICD-10-CM

## 2016-11-08 NOTE — Progress Notes (Signed)
VASCULAR LAB PRELIMINARY  PRELIMINARY  PRELIMINARY  PRELIMINARY  Left lower extremity venous duplex completed.    Preliminary report:  Left:  No evidence of DVT, superficial thrombosis, or Baker's cyst. Multiple non thrombosed varicosities noted.  Darleth Eustache, RVS 11/08/2016, 9:19 AM

## 2016-11-27 ENCOUNTER — Telehealth: Payer: Self-pay | Admitting: *Deleted

## 2016-11-27 DIAGNOSIS — H353131 Nonexudative age-related macular degeneration, bilateral, early dry stage: Secondary | ICD-10-CM | POA: Diagnosis not present

## 2016-11-27 DIAGNOSIS — E119 Type 2 diabetes mellitus without complications: Secondary | ICD-10-CM | POA: Diagnosis not present

## 2016-11-27 NOTE — Telephone Encounter (Signed)
"  My eyes are problematic.  I do not know if the chemotherapy is causing this or if I need to see my eye doctor.  I have type II diabetes and optical migraines.  This is different from before.  Two days ago my sight was blocked to the lower part of my right eye.  Yesterday it happened again.  It lasted a few seconds.  No pain.  Had styes to both eyes recently and still have a red mark to the left eye from a stye."   Will notify Dr. [name].  Nurse instructions were to contact ophthalmologist.  [facility] nor the ED have equipment to evaluate the eye.  Eye exam results can be shared with Dr.Gudena tomorrow with this scheduled follow up visit.

## 2016-11-27 NOTE — Telephone Encounter (Signed)
Thank you for the FYI

## 2016-11-28 ENCOUNTER — Ambulatory Visit (HOSPITAL_BASED_OUTPATIENT_CLINIC_OR_DEPARTMENT_OTHER): Payer: Medicare Other

## 2016-11-28 ENCOUNTER — Encounter: Payer: Self-pay | Admitting: Pharmacist

## 2016-11-28 ENCOUNTER — Other Ambulatory Visit (HOSPITAL_BASED_OUTPATIENT_CLINIC_OR_DEPARTMENT_OTHER): Payer: Medicare Other

## 2016-11-28 ENCOUNTER — Ambulatory Visit (HOSPITAL_BASED_OUTPATIENT_CLINIC_OR_DEPARTMENT_OTHER): Payer: Medicare Other | Admitting: Hematology and Oncology

## 2016-11-28 ENCOUNTER — Other Ambulatory Visit: Payer: Medicare Other

## 2016-11-28 ENCOUNTER — Encounter: Payer: Self-pay | Admitting: Hematology and Oncology

## 2016-11-28 ENCOUNTER — Ambulatory Visit: Payer: Medicare Other | Admitting: Hematology and Oncology

## 2016-11-28 ENCOUNTER — Ambulatory Visit: Payer: Medicare Other

## 2016-11-28 DIAGNOSIS — C50919 Malignant neoplasm of unspecified site of unspecified female breast: Secondary | ICD-10-CM

## 2016-11-28 DIAGNOSIS — C778 Secondary and unspecified malignant neoplasm of lymph nodes of multiple regions: Secondary | ICD-10-CM

## 2016-11-28 DIAGNOSIS — Z5112 Encounter for antineoplastic immunotherapy: Secondary | ICD-10-CM

## 2016-11-28 DIAGNOSIS — C787 Secondary malignant neoplasm of liver and intrahepatic bile duct: Secondary | ICD-10-CM | POA: Diagnosis not present

## 2016-11-28 LAB — COMPREHENSIVE METABOLIC PANEL
ALBUMIN: 3.8 g/dL (ref 3.5–5.0)
ALK PHOS: 58 U/L (ref 40–150)
ALT: 12 U/L (ref 0–55)
AST: 24 U/L (ref 5–34)
Anion Gap: 9 mEq/L (ref 3–11)
BILIRUBIN TOTAL: 0.47 mg/dL (ref 0.20–1.20)
BUN: 14.2 mg/dL (ref 7.0–26.0)
CO2: 28 mEq/L (ref 22–29)
CREATININE: 0.8 mg/dL (ref 0.6–1.1)
Calcium: 9.5 mg/dL (ref 8.4–10.4)
Chloride: 104 mEq/L (ref 98–109)
EGFR: 73 mL/min/{1.73_m2} — ABNORMAL LOW (ref >90)
Glucose: 94 mg/dl (ref 70–140)
Potassium: 4.6 mEq/L (ref 3.5–5.1)
SODIUM: 141 meq/L (ref 136–145)
Total Protein: 6.8 g/dL (ref 6.4–8.3)

## 2016-11-28 LAB — CBC WITH DIFFERENTIAL/PLATELET
BASO%: 0.7 % (ref 0.0–2.0)
Basophils Absolute: 0 10*3/uL (ref 0.0–0.1)
EOS ABS: 0 10*3/uL (ref 0.0–0.5)
EOS%: 0.9 % (ref 0.0–7.0)
HCT: 35.6 % (ref 34.8–46.6)
HEMOGLOBIN: 12 g/dL (ref 11.6–15.9)
LYMPH%: 31.4 % (ref 14.0–49.7)
MCH: 31.7 pg (ref 25.1–34.0)
MCHC: 33.7 g/dL (ref 31.5–36.0)
MCV: 94.2 fL (ref 79.5–101.0)
MONO#: 0.6 10*3/uL (ref 0.1–0.9)
MONO%: 13.3 % (ref 0.0–14.0)
NEUT#: 2.5 10*3/uL (ref 1.5–6.5)
NEUT%: 53.7 % (ref 38.4–76.8)
Platelets: 195 10*3/uL (ref 145–400)
RBC: 3.78 10*6/uL (ref 3.70–5.45)
RDW: 13.9 % (ref 11.2–14.5)
WBC: 4.6 10*3/uL (ref 3.9–10.3)
lymph#: 1.4 10*3/uL (ref 0.9–3.3)

## 2016-11-28 MED ORDER — ACETAMINOPHEN 325 MG PO TABS
ORAL_TABLET | ORAL | Status: AC
  Filled 2016-11-28: qty 2

## 2016-11-28 MED ORDER — ACETAMINOPHEN 325 MG PO TABS
650.0000 mg | ORAL_TABLET | Freq: Once | ORAL | Status: AC
  Administered 2016-11-28: 650 mg via ORAL

## 2016-11-28 MED ORDER — TRASTUZUMAB CHEMO 150 MG IV SOLR
6.0000 mg/kg | Freq: Once | INTRAVENOUS | Status: AC
  Administered 2016-11-28: 462 mg via INTRAVENOUS
  Filled 2016-11-28: qty 22

## 2016-11-28 MED ORDER — DIPHENHYDRAMINE HCL 25 MG PO CAPS
ORAL_CAPSULE | ORAL | Status: AC
  Filled 2016-11-28: qty 2

## 2016-11-28 MED ORDER — HEPARIN SOD (PORK) LOCK FLUSH 100 UNIT/ML IV SOLN
500.0000 [IU] | Freq: Once | INTRAVENOUS | Status: AC | PRN
  Administered 2016-11-28: 500 [IU]
  Filled 2016-11-28: qty 5

## 2016-11-28 MED ORDER — DIPHENHYDRAMINE HCL 25 MG PO CAPS
50.0000 mg | ORAL_CAPSULE | Freq: Once | ORAL | Status: AC
Start: 2016-11-28 — End: 2016-11-28
  Administered 2016-11-28: 50 mg via ORAL

## 2016-11-28 MED ORDER — SODIUM CHLORIDE 0.9% FLUSH
10.0000 mL | INTRAVENOUS | Status: DC | PRN
  Administered 2016-11-28: 10 mL
  Filled 2016-11-28: qty 10

## 2016-11-28 MED ORDER — SODIUM CHLORIDE 0.9 % IV SOLN
Freq: Once | INTRAVENOUS | Status: AC
  Administered 2016-11-28: 12:00:00 via INTRAVENOUS

## 2016-11-28 NOTE — Progress Notes (Signed)
Consent documentation  Study code: [id number]  Met with patient and her son during her infusion appointment on 11/28/16 at 12:00. Provided an overview of the "Pharmacogenetic analysis of toxicities related to administration of taxanes in breast cancer patients" study.  Consent form was reviewed with the patient (reviewed the study purpose, patient's role, possible side effects, cost, risk, information protection) All of the patient's questions were answered and she agreed to participate in the study. A signed copy of the consent form was given to the patient. All eligibility criteria have been met and patient has been enrolled in the study.  Study sample was collected via buccal swab after consent was obtained. Patient was informed that the sample would be sent to the lab for processing after samples were collected from 25 patients and that it would take approximately 2 weeks for the lab to process that sample.   Met with the patient for approximately 15 minutes.  Darl Pikes, PharmD, [place] Clinical Pharmacist- Oncology Pharmacy Resident

## 2016-11-28 NOTE — Assessment & Plan Note (Signed)
Metastatic breast cancer with innumerable liver metastases detected on ultrasound and recent MRI of the liver on 04/26/2016 (Prior history of left breast IDC T1 1 N0 stage IA 0.3 cm grade 1 tumor that was ER 38%, PR 93%, Ki-67 10%, HER-2 2+, no tissue for FISH testing, 0/1 lymph node negative, status post lumpectomy radiation and 5 years of Arimidex completed in February 2013)  PET/CT scan: 05/07/2016: Extensive metastatic involvement of both lobes of the liver, primary could be liver, phalangeal or metastatic disease, hypermetabolic thoracic lymph nodes left axillary, left supraclavicular and right CP angle lymph nodes, hypermetabolic upper abdominal lymph nodes  Liver biopsy10/07/2016: Metastatic carcinoma breast primary strong positivity for CK 7 weak focal positivity for ER, GCDFP, negative for PR, CDX 2, TTF-1, WT 1; HER-2 positive  Treatment plan: 1. Taxotere Herceptin and Perjeta every 3 weeks palliative chemotherapy followed by Herceptin maintenance after 6 cycles started 06/13/16 (Perjeta discontinued after 5 cycles due to allergy) ----------------------------------------------------------------------------------------------------------------------------------------- Current Treatment: Cycle 7day 1Herceptin, addedTaxotere with cycle 2  Perjeta was discontinued with cycle 5 due to an allergic reaction  Chemotherapy Toxicities:  1. Diarrhea due to Perjeta:Perjeta has been discontinued 2. Elevated LFTs: Being monitored closely, normal today 3. Nausea after first treatment with Herceptin and Perjeta: Resolved. 4. Infusion reaction to Perjeta:This is in spite of adding premedications. We discontinued Perjeta. No longer an issue 5. Redness of the face 24 hours after chemotherapy.  6. Peripheral neuropathy: Intermittent right now, may need to d/c taxotere if continues or worsens.  I counseled her on b complex vitamin, vitamin e and doing finger exercises.    10/16/2016:CT CAP:  no  evidence of lymphadenopathy in the chest. Previous lymph nodes appear resolved and others are stable, decrease in the bulk of the liver metastases and resolution of the abdominal lymphadenopathy.   Goals of treatment: Palliation of symptoms and prolongation of life I recommended finishing this treatment and subsequently going on Herceptin maintenance.

## 2016-11-28 NOTE — Patient Instructions (Signed)
[facility] Discharge Instructions for Patients Receiving Chemotherapy  Today you received the following chemotherapy agents: Herceptin   To help prevent nausea and vomiting after your treatment, we encourage you to take your nausea medication as directed.    If you develop nausea and vomiting that is not controlled by your nausea medication, call the clinic.   BELOW ARE SYMPTOMS THAT SHOULD BE REPORTED IMMEDIATELY:  *FEVER GREATER THAN 100.5 F  *CHILLS WITH OR WITHOUT FEVER  NAUSEA AND VOMITING THAT IS NOT CONTROLLED WITH YOUR NAUSEA MEDICATION  *UNUSUAL SHORTNESS OF BREATH  *UNUSUAL BRUISING OR BLEEDING  TENDERNESS IN MOUTH AND THROAT WITH OR WITHOUT PRESENCE OF ULCERS  *URINARY PROBLEMS  *BOWEL PROBLEMS  UNUSUAL RASH Items with * indicate a potential emergency and should be followed up as soon as possible.  Feel free to call the clinic you have any questions or concerns. The clinic phone number is [phone].  Please show the CHEMO ALERT CARD at check-in to the Emergency Department and triage nurse.   

## 2016-11-28 NOTE — Progress Notes (Signed)
Patient Care Team: [name], MD as PCP - General (Internal Medicine)  DIAGNOSIS:  Encounter Diagnosis  Name Primary?  . Metastatic breast cancer ([place])     SUMMARY OF ONCOLOGIC HISTORY:   Metastatic breast cancer ([place])   06/05/2006 Initial Diagnosis    Left breast biopsy: DCIS with apocrine features ER 30%, PR 13%      06/19/2006 Surgery    Left lumpectomy: stage I, pT1a, pN0(i)(sn), pMX, 0.3 cm IDC, grade 1, with extensive high-grade DCIS , margins negative ER 38 %, PR 93 %, Ki-67 10%, HER-2/neu 2+, no additional tissue available for FISH testing, with 0/1 left axillary lymph nodes.      07/30/2006 - 09/17/2006 Radiation Therapy    Adjuvant radiation therapy      09/24/2006 - 09/25/2011 Anti-estrogen oral therapy    Antiestrogen therapy with Arimidex 5 years      04/26/2016 Relapse/Recurrence    Innumerable hepatic lesions seen in both lobes most are 1-2 cm size somewhat confluent largest in the posterior right hepatic dome 3 x 4.6 cm      05/07/2016 PET scan    PET/CT scan: Extensive metastatic involvement of both lobes of the liver, primary could be liver, phalangeal or metastatic disease, hypermetabolic thoracic LN left axillary, left supraclavicular and right CP angle LN, hypermetabolic upper abdominal LN      05/16/2016 Initial Biopsy    Liver biopsy: Metastatic carcinoma breast primary strong positivity for CK 7 weak focal positivity for ER, GCDFP, negative for PR, CDX 2, TTF-1, WT 1; HER-2 positive      06/13/2016 -  Chemotherapy    Taxotere Herceptin and Perjeta every 3 weeks (Taxotere was held for elevated LFTs for cycles 1 and 2)        CHIEF COMPLIANT: Herceptin maintenance therapy  INTERVAL HISTORY: Monique Figueroa is a 83 year old who finished 6 cycles of Taxotere chemotherapy. She is here today to receive Herceptin maintenance. She reports to me that recently she had an episode of complete blindness that resolved immediately. Ophthalmologist evaluation  suggested this may be a venous obstruction. Apart from this she has good energy and good appetite as well as good taste.  REVIEW OF SYSTEMS:   Constitutional: Denies fevers, chills or abnormal weight loss Eyes: Denies blurriness of vision Ears, nose, mouth, throat, and face: Denies mucositis or sore throat Respiratory: Denies cough, dyspnea or wheezes Cardiovascular: Denies palpitation, chest discomfort Gastrointestinal:  Denies nausea, heartburn or change in bowel habits Skin: Denies abnormal skin rashes Lymphatics: Denies new lymphadenopathy or easy bruising Neurological:Denies numbness, tingling or new weaknesses Behavioral/Psych: Mood is stable, no new changes  Extremities: No lower extremity edema Breast:  denies any pain or lumps or nodules in either breasts All other systems were reviewed with the patient and are negative.  I have reviewed the past medical history, past surgical history, social history and family history with the patient and they are unchanged from previous note.  ALLERGIES:  is allergic to perjeta [pertuzumab] and sulfa antibiotics.  MEDICATIONS:  Current Outpatient Prescriptions  Medication Sig Dispense Refill  . hydrochlorothiazide (HYDRODIURIL) 25 MG tablet Take 25 mg by mouth daily.     Marland Kitchen HYDROcodone-acetaminophen (NORCO/VICODIN) 5-325 MG tablet Take 1-2 tablets by mouth every 4 (four) hours as needed for moderate pain or severe pain. 40 tablet 0  . levothyroxine (SYNTHROID, LEVOTHROID) 125 MCG tablet Take 125 mcg by mouth daily before breakfast.     . loratadine (CLARITIN) 10 MG tablet Take 10 mg by  mouth daily as needed for allergies.    . metFORMIN (GLUCOPHAGE) 500 MG tablet Take 500 mg by mouth daily with breakfast.     . Multiple Vitamins-Minerals (MULTIVITAMIN WITH MINERALS) tablet Take 1 tablet by mouth daily.    Marland Kitchen omeprazole (PRILOSEC OTC) 20 MG tablet Take 20 mg by mouth daily.    . ranitidine (ZANTAC) 150 MG tablet Take 150 mg by mouth at bedtime.     . vitamin B-12 (CYANOCOBALAMIN) 500 MCG tablet Take 500 mcg by mouth daily as needed.    . vitamin C (ASCORBIC ACID) 500 MG tablet Take 500 mg by mouth daily.     No current facility-administered medications for this visit.    Facility-Administered Medications Ordered in Other Visits  Medication Dose Route Frequency Provider Last Rate Last Dose  . heparin lock flush 100 unit/mL  500 Units Intracatheter Once PRN Nicholas Lose, MD      . sodium chloride flush (NS) 0.9 % injection 10 mL  10 mL Intracatheter PRN Nicholas Lose, MD      . trastuzumab (HERCEPTIN) 462 mg in sodium chloride 0.9 % 250 mL chemo infusion  6 mg/kg (Treatment Plan Recorded) Intravenous Once Nicholas Lose, MD        PHYSICAL EXAMINATION: ECOG PERFORMANCE STATUS: 1 - Symptomatic but completely ambulatory  Vitals:   11/28/16 1107  BP: (!) 164/64  Pulse: 78  Resp: 18  Temp: 98.1 F (36.7 C)   Filed Weights   11/28/16 1107  Weight: 169 lb 14.4 oz (77.1 kg)    GENERAL:alert, no distress and comfortable SKIN: skin color, texture, turgor are normal, no rashes or significant lesions EYES: normal, Conjunctiva are pink and non-injected, sclera clear OROPHARYNX:no exudate, no erythema and lips, buccal mucosa, and tongue normal  NECK: supple, thyroid normal size, non-tender, without nodularity LYMPH:  no palpable lymphadenopathy in the cervical, axillary or inguinal LUNGS: clear to auscultation and percussion with normal breathing effort HEART: regular rate & rhythm and no murmurs and no lower extremity edema ABDOMEN:abdomen soft, non-tender and normal bowel sounds MUSCULOSKELETAL:no cyanosis of digits and no clubbing  NEURO: alert & oriented x 3 with fluent speech, no focal motor/sensory deficits EXTREMITIES: No lower extremity edema   LABORATORY DATA:  I have reviewed the data as listed   Chemistry      Component Value Date/Time   NA 141 11/28/2016 1020   K 4.6 11/28/2016 1020   CL 105 05/16/2016 1254   CL  104 06/09/2012 1402   CO2 28 11/28/2016 1020   BUN 14.2 11/28/2016 1020   CREATININE 0.8 11/28/2016 1020      Component Value Date/Time   CALCIUM 9.5 11/28/2016 1020   ALKPHOS 58 11/28/2016 1020   AST 24 11/28/2016 1020   ALT 12 11/28/2016 1020   BILITOT 0.47 11/28/2016 1020       Lab Results  Component Value Date   WBC 4.6 11/28/2016   HGB 12.0 11/28/2016   HCT 35.6 11/28/2016   MCV 94.2 11/28/2016   PLT 195 11/28/2016   NEUTROABS 2.5 11/28/2016    ASSESSMENT & PLAN:  Metastatic breast cancer ([facility]) Metastatic breast cancer with innumerable liver metastases detected on ultrasound and recent MRI of the liver on 04/26/2016 (Prior history of left breast IDC T1 1 N0 stage IA 0.3 cm grade 1 tumor that was ER 38%, PR 93%, Ki-67 10%, HER-2 2+, no tissue for FISH testing, 0/1 lymph node negative, status post lumpectomy radiation and 5 years of Arimidex completed in  February 2013)  PET/CT scan: 05/07/2016: Extensive metastatic involvement of both lobes of the liver, primary could be liver, phalangeal or metastatic disease, hypermetabolic thoracic lymph nodes left axillary, left supraclavicular and right CP angle lymph nodes, hypermetabolic upper abdominal lymph nodes  Liver biopsy10/07/2016: Metastatic carcinoma breast primary strong positivity for CK 7 weak focal positivity for ER, GCDFP, negative for PR, CDX 2, TTF-1, WT 1; HER-2 positive  Treatment plan: 1. Taxotere Herceptin and Perjeta every 3 weeks palliative chemotherapy started 06/13/16 (Perjeta discontinued after 5 cycles due to allergy) 2. followed by Herceptin maintenance   ----------------------------------------------------------------------------------------------------------------------------------------- Current Treatment: Herceptin maintenance Peripheral neuropathy: Intermittent right now, Recent blindness that was transient: I instructed her to Take 81 mg aspirin daily.  10/16/2016:CT CAP:  no evidence of  lymphadenopathy in the chest. Previous lymph nodes appear resolved and others are stable, decrease in the bulk of the liver metastases and resolution of the abdominal lymphadenopathy.   Goals of treatment: Palliation of symptoms and prolongation of life I continue Herceptin maintenance indefinitely as long as she is responding.  I spent 25 minutes talking to the patient of which more than half was spent in counseling and coordination of care.  No orders of the defined types were placed in this encounter.  The patient has a good understanding of the overall plan. she agrees with it. she will call with any problems that may develop before the next visit here.   Rulon Eisenmenger, MD 11/28/16

## 2016-12-18 ENCOUNTER — Other Ambulatory Visit: Payer: Self-pay | Admitting: Emergency Medicine

## 2016-12-18 DIAGNOSIS — C787 Secondary malignant neoplasm of liver and intrahepatic bile duct: Secondary | ICD-10-CM

## 2016-12-19 ENCOUNTER — Other Ambulatory Visit (HOSPITAL_BASED_OUTPATIENT_CLINIC_OR_DEPARTMENT_OTHER): Payer: Medicare Other

## 2016-12-19 ENCOUNTER — Other Ambulatory Visit: Payer: Self-pay | Admitting: Emergency Medicine

## 2016-12-19 ENCOUNTER — Ambulatory Visit (HOSPITAL_BASED_OUTPATIENT_CLINIC_OR_DEPARTMENT_OTHER): Payer: Medicare Other

## 2016-12-19 VITALS — BP 170/76 | HR 79 | Temp 97.8°F | Resp 18

## 2016-12-19 DIAGNOSIS — C778 Secondary and unspecified malignant neoplasm of lymph nodes of multiple regions: Secondary | ICD-10-CM | POA: Diagnosis not present

## 2016-12-19 DIAGNOSIS — C50919 Malignant neoplasm of unspecified site of unspecified female breast: Secondary | ICD-10-CM

## 2016-12-19 DIAGNOSIS — Z5111 Encounter for antineoplastic chemotherapy: Secondary | ICD-10-CM

## 2016-12-19 DIAGNOSIS — C787 Secondary malignant neoplasm of liver and intrahepatic bile duct: Secondary | ICD-10-CM

## 2016-12-19 LAB — COMPREHENSIVE METABOLIC PANEL
ALT: 12 U/L (ref 0–55)
AST: 23 U/L (ref 5–34)
Albumin: 3.8 g/dL (ref 3.5–5.0)
Alkaline Phosphatase: 62 U/L (ref 40–150)
Anion Gap: 8 mEq/L (ref 3–11)
BILIRUBIN TOTAL: 0.49 mg/dL (ref 0.20–1.20)
BUN: 15.1 mg/dL (ref 7.0–26.0)
CHLORIDE: 103 meq/L (ref 98–109)
CO2: 28 meq/L (ref 22–29)
CREATININE: 0.9 mg/dL (ref 0.6–1.1)
Calcium: 9.3 mg/dL (ref 8.4–10.4)
EGFR: 59 mL/min/{1.73_m2} — ABNORMAL LOW (ref >90)
GLUCOSE: 91 mg/dL (ref 70–140)
Potassium: 4.4 mEq/L (ref 3.5–5.1)
SODIUM: 139 meq/L (ref 136–145)
TOTAL PROTEIN: 7 g/dL (ref 6.4–8.3)

## 2016-12-19 LAB — CBC WITH DIFFERENTIAL/PLATELET
BASO%: 0.4 % (ref 0.0–2.0)
Basophils Absolute: 0 10*3/uL (ref 0.0–0.1)
EOS%: 2.7 % (ref 0.0–7.0)
Eosinophils Absolute: 0.1 10*3/uL (ref 0.0–0.5)
HCT: 37 % (ref 34.8–46.6)
HGB: 12.3 g/dL (ref 11.6–15.9)
LYMPH%: 32.6 % (ref 14.0–49.7)
MCH: 31.8 pg (ref 25.1–34.0)
MCHC: 33.2 g/dL (ref 31.5–36.0)
MCV: 95.6 fL (ref 79.5–101.0)
MONO#: 0.5 10*3/uL (ref 0.1–0.9)
MONO%: 9.9 % (ref 0.0–14.0)
NEUT%: 54.4 % (ref 38.4–76.8)
NEUTROS ABS: 2.6 10*3/uL (ref 1.5–6.5)
Platelets: 179 10*3/uL (ref 145–400)
RBC: 3.87 10*6/uL (ref 3.70–5.45)
RDW: 13.9 % (ref 11.2–14.5)
WBC: 4.8 10*3/uL (ref 3.9–10.3)
lymph#: 1.6 10*3/uL (ref 0.9–3.3)

## 2016-12-19 MED ORDER — ACETAMINOPHEN 325 MG PO TABS
650.0000 mg | ORAL_TABLET | Freq: Once | ORAL | Status: AC
  Administered 2016-12-19: 650 mg via ORAL

## 2016-12-19 MED ORDER — SODIUM CHLORIDE 0.9% FLUSH
10.0000 mL | INTRAVENOUS | Status: DC | PRN
  Administered 2016-12-19: 10 mL
  Filled 2016-12-19: qty 10

## 2016-12-19 MED ORDER — TRASTUZUMAB CHEMO 150 MG IV SOLR
450.0000 mg | Freq: Once | INTRAVENOUS | Status: AC
  Administered 2016-12-19: 450 mg via INTRAVENOUS
  Filled 2016-12-19: qty 21.43

## 2016-12-19 MED ORDER — SODIUM CHLORIDE 0.9 % IV SOLN
Freq: Once | INTRAVENOUS | Status: AC
  Administered 2016-12-19: 11:00:00 via INTRAVENOUS

## 2016-12-19 MED ORDER — ACETAMINOPHEN 325 MG PO TABS
ORAL_TABLET | ORAL | Status: AC
  Filled 2016-12-19: qty 2

## 2016-12-19 MED ORDER — DIPHENHYDRAMINE HCL 25 MG PO CAPS
50.0000 mg | ORAL_CAPSULE | Freq: Once | ORAL | Status: AC
  Administered 2016-12-19: 50 mg via ORAL

## 2016-12-19 MED ORDER — DIPHENHYDRAMINE HCL 25 MG PO CAPS
ORAL_CAPSULE | ORAL | Status: AC
  Filled 2016-12-19: qty 2

## 2016-12-19 MED ORDER — HEPARIN SOD (PORK) LOCK FLUSH 100 UNIT/ML IV SOLN
500.0000 [IU] | Freq: Once | INTRAVENOUS | Status: AC | PRN
  Administered 2016-12-19: 500 [IU]
  Filled 2016-12-19: qty 5

## 2016-12-19 NOTE — Progress Notes (Signed)
Echo being scheduled ok to proceed with Herceptin today per Dr. [name].

## 2016-12-19 NOTE — Patient Instructions (Signed)
[facility] Discharge Instructions for Patients Receiving Chemotherapy  Today you received the following chemotherapy agents:  Herceptin  To help prevent nausea and vomiting after your treatment, we encourage you to take your nausea medication as prescribed.   If you develop nausea and vomiting that is not controlled by your nausea medication, call the clinic.   BELOW ARE SYMPTOMS THAT SHOULD BE REPORTED IMMEDIATELY:  *FEVER GREATER THAN 100.5 F  *CHILLS WITH OR WITHOUT FEVER  NAUSEA AND VOMITING THAT IS NOT CONTROLLED WITH YOUR NAUSEA MEDICATION  *UNUSUAL SHORTNESS OF BREATH  *UNUSUAL BRUISING OR BLEEDING  TENDERNESS IN MOUTH AND THROAT WITH OR WITHOUT PRESENCE OF ULCERS  *URINARY PROBLEMS  *BOWEL PROBLEMS  UNUSUAL RASH Items with * indicate a potential emergency and should be followed up as soon as possible.  Feel free to call the clinic you have any questions or concerns. The clinic phone number is [phone].  Please show the CHEMO ALERT CARD at check-in to the Emergency Department and triage nurse.   

## 2017-01-08 ENCOUNTER — Other Ambulatory Visit: Payer: Self-pay

## 2017-01-08 DIAGNOSIS — C50919 Malignant neoplasm of unspecified site of unspecified female breast: Secondary | ICD-10-CM

## 2017-01-09 ENCOUNTER — Ambulatory Visit (HOSPITAL_BASED_OUTPATIENT_CLINIC_OR_DEPARTMENT_OTHER): Payer: Medicare Other | Admitting: Hematology and Oncology

## 2017-01-09 ENCOUNTER — Encounter: Payer: Self-pay | Admitting: Hematology and Oncology

## 2017-01-09 ENCOUNTER — Ambulatory Visit (HOSPITAL_BASED_OUTPATIENT_CLINIC_OR_DEPARTMENT_OTHER): Payer: Medicare Other

## 2017-01-09 ENCOUNTER — Other Ambulatory Visit (HOSPITAL_BASED_OUTPATIENT_CLINIC_OR_DEPARTMENT_OTHER): Payer: Medicare Other

## 2017-01-09 DIAGNOSIS — C778 Secondary and unspecified malignant neoplasm of lymph nodes of multiple regions: Secondary | ICD-10-CM

## 2017-01-09 DIAGNOSIS — Z5112 Encounter for antineoplastic immunotherapy: Secondary | ICD-10-CM | POA: Diagnosis not present

## 2017-01-09 DIAGNOSIS — C787 Secondary malignant neoplasm of liver and intrahepatic bile duct: Secondary | ICD-10-CM | POA: Diagnosis not present

## 2017-01-09 DIAGNOSIS — C50919 Malignant neoplasm of unspecified site of unspecified female breast: Secondary | ICD-10-CM

## 2017-01-09 LAB — CBC WITH DIFFERENTIAL/PLATELET
BASO%: 0.5 % (ref 0.0–2.0)
BASOS ABS: 0 10*3/uL (ref 0.0–0.1)
EOS ABS: 0.1 10*3/uL (ref 0.0–0.5)
EOS%: 2.7 % (ref 0.0–7.0)
HEMATOCRIT: 37 % (ref 34.8–46.6)
HGB: 12.3 g/dL (ref 11.6–15.9)
LYMPH#: 1.2 10*3/uL (ref 0.9–3.3)
LYMPH%: 29.5 % (ref 14.0–49.7)
MCH: 31.5 pg (ref 25.1–34.0)
MCHC: 33.2 g/dL (ref 31.5–36.0)
MCV: 94.9 fL (ref 79.5–101.0)
MONO#: 0.4 10*3/uL (ref 0.1–0.9)
MONO%: 10.2 % (ref 0.0–14.0)
NEUT%: 57.1 % (ref 38.4–76.8)
NEUTROS ABS: 2.4 10*3/uL (ref 1.5–6.5)
Platelets: 170 10*3/uL (ref 145–400)
RBC: 3.9 10*6/uL (ref 3.70–5.45)
RDW: 13.7 % (ref 11.2–14.5)
WBC: 4.1 10*3/uL (ref 3.9–10.3)

## 2017-01-09 LAB — COMPREHENSIVE METABOLIC PANEL
ALBUMIN: 3.7 g/dL (ref 3.5–5.0)
ALK PHOS: 67 U/L (ref 40–150)
ALT: 11 U/L (ref 0–55)
AST: 22 U/L (ref 5–34)
Anion Gap: 10 mEq/L (ref 3–11)
BILIRUBIN TOTAL: 0.46 mg/dL (ref 0.20–1.20)
BUN: 19.9 mg/dL (ref 7.0–26.0)
CALCIUM: 9.3 mg/dL (ref 8.4–10.4)
CO2: 29 mEq/L (ref 22–29)
Chloride: 104 mEq/L (ref 98–109)
Creatinine: 0.8 mg/dL (ref 0.6–1.1)
EGFR: 66 mL/min/{1.73_m2} — AB (ref >90)
GLUCOSE: 100 mg/dL (ref 70–140)
Potassium: 3.7 mEq/L (ref 3.5–5.1)
Sodium: 143 mEq/L (ref 136–145)
TOTAL PROTEIN: 6.7 g/dL (ref 6.4–8.3)

## 2017-01-09 MED ORDER — SODIUM CHLORIDE 0.9% FLUSH
10.0000 mL | INTRAVENOUS | Status: DC | PRN
  Administered 2017-01-09: 10 mL
  Filled 2017-01-09: qty 10

## 2017-01-09 MED ORDER — HEPARIN SOD (PORK) LOCK FLUSH 100 UNIT/ML IV SOLN
500.0000 [IU] | Freq: Once | INTRAVENOUS | Status: AC | PRN
  Administered 2017-01-09: 500 [IU]
  Filled 2017-01-09: qty 5

## 2017-01-09 MED ORDER — ACETAMINOPHEN 325 MG PO TABS
650.0000 mg | ORAL_TABLET | Freq: Once | ORAL | Status: AC
  Administered 2017-01-09: 650 mg via ORAL

## 2017-01-09 MED ORDER — DIPHENHYDRAMINE HCL 25 MG PO CAPS
50.0000 mg | ORAL_CAPSULE | Freq: Once | ORAL | Status: AC
  Administered 2017-01-09: 50 mg via ORAL

## 2017-01-09 MED ORDER — DIPHENHYDRAMINE HCL 25 MG PO CAPS
ORAL_CAPSULE | ORAL | Status: AC
  Filled 2017-01-09: qty 2

## 2017-01-09 MED ORDER — SODIUM CHLORIDE 0.9 % IV SOLN
Freq: Once | INTRAVENOUS | Status: AC
  Administered 2017-01-09: 11:00:00 via INTRAVENOUS

## 2017-01-09 MED ORDER — ACETAMINOPHEN 325 MG PO TABS
ORAL_TABLET | ORAL | Status: AC
  Filled 2017-01-09: qty 2

## 2017-01-09 MED ORDER — TRASTUZUMAB CHEMO 150 MG IV SOLR
450.0000 mg | Freq: Once | INTRAVENOUS | Status: AC
  Administered 2017-01-09: 450 mg via INTRAVENOUS
  Filled 2017-01-09: qty 21.43

## 2017-01-09 NOTE — Assessment & Plan Note (Signed)
Metastatic breast cancer with innumerable liver metastases detected on ultrasound and recent MRI of the liver on 04/26/2016 (Prior history of left breast IDC T1 1 N0 stage IA 0.3 cm grade 1 tumor that was ER 38%, PR 93%, Ki-67 10%, HER-2 2+, no tissue for FISH testing, 0/1 lymph node negative, status post lumpectomy radiation and 5 years of Arimidex completed in February 2013)  PET/CT scan: 05/07/2016: Extensive metastatic involvement of both lobes of the liver, primary could be liver, phalangeal or metastatic disease, hypermetabolic thoracic lymph nodes left axillary, left supraclavicular and right CP angle lymph nodes, hypermetabolic upper abdominal lymph nodes  Liver biopsy10/07/2016: Metastatic carcinoma breast primary strong positivity for CK 7 weak focal positivity for ER, GCDFP, negative for PR, CDX 2, TTF-1, WT 1; HER-2 positive  Treatment plan: 1. Taxotere Herceptin and Perjeta every 3 weeks palliative chemotherapy started 06/13/16 (Perjeta discontinued after 5 cycles due to allergy) 2. followed by Herceptin maintenance   ----------------------------------------------------------------------------------------------------------------------------------------- Current Treatment: Herceptin maintenance Peripheral neuropathy: Intermittent right now, Blindness that was transient: I instructed her to Take 81 mg aspirin daily.  10/16/2016:CT CAP:  no evidence of lymphadenopathy in the chest. Previous lymph nodes appear resolved and others are stable, decrease in the bulk of the liver metastases and resolution of the abdominal lymphadenopathy.   Goals of treatment: Palliation of symptoms and prolongation of life Continue Herceptin maintenance indefinitely as long as she is responding.  Return to clinic in 6 weeks for follow-up

## 2017-01-09 NOTE — Patient Instructions (Signed)
[facility] Discharge Instructions for Patients Receiving Chemotherapy  Today you received the following chemotherapy agents:  Herceptin  To help prevent nausea and vomiting after your treatment, we encourage you to take your nausea medication as prescribed.   If you develop nausea and vomiting that is not controlled by your nausea medication, call the clinic.   BELOW ARE SYMPTOMS THAT SHOULD BE REPORTED IMMEDIATELY:  *FEVER GREATER THAN 100.5 F  *CHILLS WITH OR WITHOUT FEVER  NAUSEA AND VOMITING THAT IS NOT CONTROLLED WITH YOUR NAUSEA MEDICATION  *UNUSUAL SHORTNESS OF BREATH  *UNUSUAL BRUISING OR BLEEDING  TENDERNESS IN MOUTH AND THROAT WITH OR WITHOUT PRESENCE OF ULCERS  *URINARY PROBLEMS  *BOWEL PROBLEMS  UNUSUAL RASH Items with * indicate a potential emergency and should be followed up as soon as possible.  Feel free to call the clinic you have any questions or concerns. The clinic phone number is [phone].  Please show the CHEMO ALERT CARD at check-in to the Emergency Department and triage nurse.   

## 2017-01-09 NOTE — Progress Notes (Signed)
Patient Care Team: [name], MD as PCP - General (Internal Medicine)  DIAGNOSIS:  Encounter Diagnosis  Name Primary?  . Metastatic breast cancer ([place])     SUMMARY OF ONCOLOGIC HISTORY:   Metastatic breast cancer ([place])   06/05/2006 Initial Diagnosis    Left breast biopsy: DCIS with apocrine features ER 30%, PR 13%      06/19/2006 Surgery    Left lumpectomy: stage I, pT1a, pN0(i)(sn), pMX, 0.3 cm IDC, grade 1, with extensive high-grade DCIS , margins negative ER 38 %, PR 93 %, Ki-67 10%, HER-2/neu 2+, no additional tissue available for FISH testing, with 0/1 left axillary lymph nodes.      07/30/2006 - 09/17/2006 Radiation Therapy    Adjuvant radiation therapy      09/24/2006 - 09/25/2011 Anti-estrogen oral therapy    Antiestrogen therapy with Arimidex 5 years      04/26/2016 Relapse/Recurrence    Innumerable hepatic lesions seen in both lobes most are 1-2 cm size somewhat confluent largest in the posterior right hepatic dome 3 x 4.6 cm      05/07/2016 PET scan    PET/CT scan: Extensive metastatic involvement of both lobes of the liver, primary could be liver, phalangeal or metastatic disease, hypermetabolic thoracic LN left axillary, left supraclavicular and right CP angle LN, hypermetabolic upper abdominal LN      05/16/2016 Initial Biopsy    Liver biopsy: Metastatic carcinoma breast primary strong positivity for CK 7 weak focal positivity for ER, GCDFP, negative for PR, CDX 2, TTF-1, WT 1; HER-2 positive      06/13/2016 -  Chemotherapy    Taxotere Herceptin and Perjeta every 3 weeks (Taxotere was held for elevated LFTs for cycles 1 and 2)        CHIEF COMPLIANT: Herceptin maintenance therapy  INTERVAL HISTORY: Monique Figueroa is a 83 year old with above-mentioned history metastatic breast cancer currently on Herceptin maintenance therapy. She is tolerating it fairly well. She does not have any major side effects from the treatment. Her cancer is also remained  fairly stable. She is asymptomatic from the cancer standpoint. She has excellent appetite and excellent quality of life.  REVIEW OF SYSTEMS:   Constitutional: Denies fevers, chills or abnormal weight loss Eyes: Denies blurriness of vision Ears, nose, mouth, throat, and face: Denies mucositis or sore throat Respiratory: Denies cough, dyspnea or wheezes Cardiovascular: Denies palpitation, chest discomfort Gastrointestinal:  Denies nausea, heartburn or change in bowel habits Skin: Denies abnormal skin rashes Lymphatics: Denies new lymphadenopathy or easy bruising Neurological:Denies numbness, tingling or new weaknesses Behavioral/Psych: Mood is stable, no new changes  Extremities: No lower extremity edema  All other systems were reviewed with the patient and are negative.  I have reviewed the past medical history, past surgical history, social history and family history with the patient and they are unchanged from previous note.  ALLERGIES:  is allergic to perjeta [pertuzumab] and sulfa antibiotics.  MEDICATIONS:  Current Outpatient Prescriptions  Medication Sig Dispense Refill  . hydrochlorothiazide (HYDRODIURIL) 25 MG tablet Take 25 mg by mouth daily.     Marland Kitchen HYDROcodone-acetaminophen (NORCO/VICODIN) 5-325 MG tablet Take 1-2 tablets by mouth every 4 (four) hours as needed for moderate pain or severe pain. 40 tablet 0  . levothyroxine (SYNTHROID, LEVOTHROID) 125 MCG tablet Take 125 mcg by mouth daily before breakfast.     . loratadine (CLARITIN) 10 MG tablet Take 10 mg by mouth daily as needed for allergies.    . metFORMIN (GLUCOPHAGE) 500 MG tablet  Take 500 mg by mouth daily with breakfast.     . Multiple Vitamins-Minerals (MULTIVITAMIN WITH MINERALS) tablet Take 1 tablet by mouth daily.    Marland Kitchen omeprazole (PRILOSEC OTC) 20 MG tablet Take 20 mg by mouth daily.    . ranitidine (ZANTAC) 150 MG tablet Take 150 mg by mouth at bedtime.    . vitamin B-12 (CYANOCOBALAMIN) 500 MCG tablet Take 500  mcg by mouth daily as needed.    . vitamin C (ASCORBIC ACID) 500 MG tablet Take 500 mg by mouth daily.     No current facility-administered medications for this visit.    Facility-Administered Medications Ordered in Other Visits  Medication Dose Route Frequency Provider Last Rate Last Dose  . sodium chloride flush (NS) 0.9 % injection 10 mL  10 mL Intracatheter PRN Nicholas Lose, MD   10 mL at 01/09/17 1145    PHYSICAL EXAMINATION: ECOG PERFORMANCE STATUS: 1 - Symptomatic but completely ambulatory  Vitals:   01/09/17 0944  BP: 132/72  Pulse: 78  Resp: 18  Temp: 98.3 F (36.8 C)   Filed Weights   01/09/17 0944  Weight: 168 lb 14.4 oz (76.6 kg)    GENERAL:alert, no distress and comfortable SKIN: skin color, texture, turgor are normal, no rashes or significant lesions EYES: normal, Conjunctiva are pink and non-injected, sclera clear OROPHARYNX:no exudate, no erythema and lips, buccal mucosa, and tongue normal  NECK: supple, thyroid normal size, non-tender, without nodularity LYMPH:  no palpable lymphadenopathy in the cervical, axillary or inguinal LUNGS: clear to auscultation and percussion with normal breathing effort HEART: regular rate & rhythm and no murmurs and no lower extremity edema ABDOMEN:abdomen soft, non-tender and normal bowel sounds MUSCULOSKELETAL:no cyanosis of digits and no clubbing  NEURO: alert & oriented x 3 with fluent speech, no focal motor/sensory deficits EXTREMITIES: No lower extremity edema   LABORATORY DATA:  I have reviewed the data as listed   Chemistry      Component Value Date/Time   NA 143 01/09/2017 0924   K 3.7 01/09/2017 0924   CL 105 05/16/2016 1254   CL 104 06/09/2012 1402   CO2 29 01/09/2017 0924   BUN 19.9 01/09/2017 0924   CREATININE 0.8 01/09/2017 0924      Component Value Date/Time   CALCIUM 9.3 01/09/2017 0924   ALKPHOS 67 01/09/2017 0924   AST 22 01/09/2017 0924   ALT 11 01/09/2017 0924   BILITOT 0.46 01/09/2017 0924         Lab Results  Component Value Date   WBC 4.1 01/09/2017   HGB 12.3 01/09/2017   HCT 37.0 01/09/2017   MCV 94.9 01/09/2017   PLT 170 01/09/2017   NEUTROABS 2.4 01/09/2017    ASSESSMENT & PLAN:  Metastatic breast cancer ([place]) Metastatic breast cancer with innumerable liver metastases detected on ultrasound and recent MRI of the liver on 04/26/2016 (Prior history of left breast IDC T1 1 N0 stage IA 0.3 cm grade 1 tumor that was ER 38%, PR 93%, Ki-67 10%, HER-2 2+, no tissue for FISH testing, 0/1 lymph node negative, status post lumpectomy radiation and 5 years of Arimidex completed in February 2013)  PET/CT scan: 05/07/2016: Extensive metastatic involvement of both lobes of the liver, primary could be liver, phalangeal or metastatic disease, hypermetabolic thoracic lymph nodes left axillary, left supraclavicular and right CP angle lymph nodes, hypermetabolic upper abdominal lymph nodes  Liver biopsy10/07/2016: Metastatic carcinoma breast primary strong positivity for CK 7 weak focal positivity for ER, GCDFP, negative for  PR, CDX 2, TTF-1, WT 1; HER-2 positive  Treatment plan: 1. Taxotere Herceptin and Perjeta every 3 weeks palliative chemotherapy started 06/13/16 (Perjeta discontinued after 5 cycles due to allergy) 2. followed by Herceptin maintenance   ----------------------------------------------------------------------------------------------------------------------------------------- Current Treatment: Herceptin maintenance Peripheral neuropathy: Intermittent right now, Blindness that was transient: on 81 mg aspirin daily.  10/16/2016:CT CAP:  no evidence of lymphadenopathy in the chest. Previous lymph nodes appear resolved and others are stable, decrease in the bulk of the liver metastases and resolution of the abdominal lymphadenopathy.   Goals of treatment: Palliation of symptoms and prolongation of life Continue Herceptin maintenance indefinitely as long as she is  responding.  Return to clinic in 6 weeks for follow-up. Our plans to obtain scans in September.   I spent 25 minutes talking to the patient of which more than half was spent in counseling and coordination of care.  No orders of the defined types were placed in this encounter.  The patient has a good understanding of the overall plan. she agrees with it. she will call with any problems that may develop before the next visit here.   Rulon Eisenmenger, MD 01/09/17

## 2017-01-14 ENCOUNTER — Ambulatory Visit (HOSPITAL_COMMUNITY)
Admission: RE | Admit: 2017-01-14 | Discharge: 2017-01-14 | Disposition: A | Discharge status: Home / Self Care | Payer: Medicare Other | Source: Ambulatory Visit | Attending: Hematology and Oncology | Admitting: Hematology and Oncology

## 2017-01-14 DIAGNOSIS — I420 Dilated cardiomyopathy: Secondary | ICD-10-CM | POA: Insufficient documentation

## 2017-01-14 DIAGNOSIS — C50919 Malignant neoplasm of unspecified site of unspecified female breast: Secondary | ICD-10-CM | POA: Diagnosis not present

## 2017-01-14 DIAGNOSIS — I5030 Unspecified diastolic (congestive) heart failure: Secondary | ICD-10-CM | POA: Diagnosis not present

## 2017-01-14 DIAGNOSIS — I083 Combined rheumatic disorders of mitral, aortic and tricuspid valves: Secondary | ICD-10-CM | POA: Insufficient documentation

## 2017-01-14 NOTE — Progress Notes (Signed)
  Echocardiogram 2D Echocardiogram has been performed.  Kamil Mchaffie T Evolette Pendell 01/14/2017, 10:55 AM

## 2017-01-30 ENCOUNTER — Ambulatory Visit (HOSPITAL_BASED_OUTPATIENT_CLINIC_OR_DEPARTMENT_OTHER): Payer: Medicare Other

## 2017-01-30 ENCOUNTER — Other Ambulatory Visit: Payer: Medicare Other

## 2017-01-30 VITALS — BP 157/81 | HR 77 | Temp 97.8°F | Resp 18

## 2017-01-30 DIAGNOSIS — C50919 Malignant neoplasm of unspecified site of unspecified female breast: Secondary | ICD-10-CM | POA: Diagnosis not present

## 2017-01-30 DIAGNOSIS — C787 Secondary malignant neoplasm of liver and intrahepatic bile duct: Secondary | ICD-10-CM

## 2017-01-30 DIAGNOSIS — C778 Secondary and unspecified malignant neoplasm of lymph nodes of multiple regions: Secondary | ICD-10-CM | POA: Diagnosis not present

## 2017-01-30 DIAGNOSIS — Z5112 Encounter for antineoplastic immunotherapy: Secondary | ICD-10-CM | POA: Diagnosis not present

## 2017-01-30 MED ORDER — DIPHENHYDRAMINE HCL 25 MG PO CAPS
ORAL_CAPSULE | ORAL | Status: AC
  Filled 2017-01-30: qty 2

## 2017-01-30 MED ORDER — DIPHENHYDRAMINE HCL 25 MG PO CAPS
50.0000 mg | ORAL_CAPSULE | Freq: Once | ORAL | Status: AC
  Administered 2017-01-30: 50 mg via ORAL

## 2017-01-30 MED ORDER — ACETAMINOPHEN 325 MG PO TABS
ORAL_TABLET | ORAL | Status: AC
  Filled 2017-01-30: qty 2

## 2017-01-30 MED ORDER — SODIUM CHLORIDE 0.9% FLUSH
10.0000 mL | INTRAVENOUS | Status: DC | PRN
  Administered 2017-01-30: 10 mL
  Filled 2017-01-30: qty 10

## 2017-01-30 MED ORDER — SODIUM CHLORIDE 0.9 % IV SOLN
Freq: Once | INTRAVENOUS | Status: AC
  Administered 2017-01-30: 11:00:00 via INTRAVENOUS

## 2017-01-30 MED ORDER — HEPARIN SOD (PORK) LOCK FLUSH 100 UNIT/ML IV SOLN
500.0000 [IU] | Freq: Once | INTRAVENOUS | Status: AC | PRN
  Administered 2017-01-30: 500 [IU]
  Filled 2017-01-30: qty 5

## 2017-01-30 MED ORDER — TRASTUZUMAB CHEMO 150 MG IV SOLR
450.0000 mg | Freq: Once | INTRAVENOUS | Status: AC
  Administered 2017-01-30: 450 mg via INTRAVENOUS
  Filled 2017-01-30: qty 21.43

## 2017-01-30 MED ORDER — ACETAMINOPHEN 325 MG PO TABS
650.0000 mg | ORAL_TABLET | Freq: Once | ORAL | Status: AC
  Administered 2017-01-30: 650 mg via ORAL

## 2017-01-30 NOTE — Patient Instructions (Signed)
[facility] Discharge Instructions for Patients Receiving Chemotherapy  Today you received the following chemotherapy agents: Herceptin   To help prevent nausea and vomiting after your treatment, we encourage you to take your nausea medication as directed.    If you develop nausea and vomiting that is not controlled by your nausea medication, call the clinic.   BELOW ARE SYMPTOMS THAT SHOULD BE REPORTED IMMEDIATELY:  *FEVER GREATER THAN 100.5 F  *CHILLS WITH OR WITHOUT FEVER  NAUSEA AND VOMITING THAT IS NOT CONTROLLED WITH YOUR NAUSEA MEDICATION  *UNUSUAL SHORTNESS OF BREATH  *UNUSUAL BRUISING OR BLEEDING  TENDERNESS IN MOUTH AND THROAT WITH OR WITHOUT PRESENCE OF ULCERS  *URINARY PROBLEMS  *BOWEL PROBLEMS  UNUSUAL RASH Items with * indicate a potential emergency and should be followed up as soon as possible.  Feel free to call the clinic you have any questions or concerns. The clinic phone number is [phone].  Please show the CHEMO ALERT CARD at check-in to the Emergency Department and triage nurse.   

## 2017-02-20 ENCOUNTER — Ambulatory Visit (HOSPITAL_BASED_OUTPATIENT_CLINIC_OR_DEPARTMENT_OTHER): Payer: Medicare Other

## 2017-02-20 ENCOUNTER — Ambulatory Visit (HOSPITAL_BASED_OUTPATIENT_CLINIC_OR_DEPARTMENT_OTHER): Payer: Medicare Other | Admitting: Hematology and Oncology

## 2017-02-20 ENCOUNTER — Encounter: Payer: Self-pay | Admitting: Hematology and Oncology

## 2017-02-20 VITALS — BP 145/85 | HR 83 | Temp 98.2°F | Resp 18 | Ht 65.0 in | Wt 171.2 lb

## 2017-02-20 DIAGNOSIS — C50919 Malignant neoplasm of unspecified site of unspecified female breast: Secondary | ICD-10-CM

## 2017-02-20 DIAGNOSIS — C787 Secondary malignant neoplasm of liver and intrahepatic bile duct: Secondary | ICD-10-CM

## 2017-02-20 DIAGNOSIS — Z5112 Encounter for antineoplastic immunotherapy: Secondary | ICD-10-CM

## 2017-02-20 LAB — CBC WITH DIFFERENTIAL/PLATELET
BASO%: 1 % (ref 0.0–2.0)
Basophils Absolute: 0 10*3/uL (ref 0.0–0.1)
EOS ABS: 0.1 10*3/uL (ref 0.0–0.5)
EOS%: 2.3 % (ref 0.0–7.0)
HEMATOCRIT: 37.1 % (ref 34.8–46.6)
HEMOGLOBIN: 12.5 g/dL (ref 11.6–15.9)
LYMPH#: 1.2 10*3/uL (ref 0.9–3.3)
LYMPH%: 24.6 % (ref 14.0–49.7)
MCH: 31.6 pg (ref 25.1–34.0)
MCHC: 33.7 g/dL (ref 31.5–36.0)
MCV: 93.6 fL (ref 79.5–101.0)
MONO#: 0.5 10*3/uL (ref 0.1–0.9)
MONO%: 10.4 % (ref 0.0–14.0)
NEUT%: 61.7 % (ref 38.4–76.8)
NEUTROS ABS: 2.9 10*3/uL (ref 1.5–6.5)
PLATELETS: 194 10*3/uL (ref 145–400)
RBC: 3.96 10*6/uL (ref 3.70–5.45)
RDW: 13.9 % (ref 11.2–14.5)
WBC: 4.8 10*3/uL (ref 3.9–10.3)

## 2017-02-20 LAB — COMPREHENSIVE METABOLIC PANEL
ALBUMIN: 3.7 g/dL (ref 3.5–5.0)
ALK PHOS: 73 U/L (ref 40–150)
ALT: 11 U/L (ref 0–55)
ANION GAP: 11 meq/L (ref 3–11)
AST: 24 U/L (ref 5–34)
BILIRUBIN TOTAL: 0.43 mg/dL (ref 0.20–1.20)
BUN: 22.7 mg/dL (ref 7.0–26.0)
CALCIUM: 9.3 mg/dL (ref 8.4–10.4)
CO2: 26 meq/L (ref 22–29)
CREATININE: 0.8 mg/dL (ref 0.6–1.1)
Chloride: 104 mEq/L (ref 98–109)
EGFR: 70 mL/min/{1.73_m2} — ABNORMAL LOW (ref >90)
Glucose: 80 mg/dl (ref 70–140)
Potassium: 3.8 mEq/L (ref 3.5–5.1)
Sodium: 140 mEq/L (ref 136–145)
TOTAL PROTEIN: 7 g/dL (ref 6.4–8.3)

## 2017-02-20 MED ORDER — HEPARIN SOD (PORK) LOCK FLUSH 100 UNIT/ML IV SOLN
500.0000 [IU] | Freq: Once | INTRAVENOUS | Status: AC | PRN
  Administered 2017-02-20: 500 [IU]
  Filled 2017-02-20: qty 5

## 2017-02-20 MED ORDER — SODIUM CHLORIDE 0.9% FLUSH
10.0000 mL | INTRAVENOUS | Status: DC | PRN
  Administered 2017-02-20: 10 mL
  Filled 2017-02-20: qty 10

## 2017-02-20 MED ORDER — DIPHENHYDRAMINE HCL 25 MG PO CAPS
50.0000 mg | ORAL_CAPSULE | Freq: Once | ORAL | Status: AC
Start: 2017-02-20 — End: 2017-02-20
  Administered 2017-02-20: 50 mg via ORAL

## 2017-02-20 MED ORDER — DIPHENHYDRAMINE HCL 25 MG PO CAPS
ORAL_CAPSULE | ORAL | Status: AC
  Filled 2017-02-20: qty 2

## 2017-02-20 MED ORDER — SODIUM CHLORIDE 0.9 % IV SOLN
Freq: Once | INTRAVENOUS | Status: AC
  Administered 2017-02-20: 10:00:00 via INTRAVENOUS

## 2017-02-20 MED ORDER — TRASTUZUMAB CHEMO 150 MG IV SOLR
450.0000 mg | Freq: Once | INTRAVENOUS | Status: AC
  Administered 2017-02-20: 450 mg via INTRAVENOUS
  Filled 2017-02-20: qty 21.43

## 2017-02-20 MED ORDER — ACETAMINOPHEN 325 MG PO TABS
ORAL_TABLET | ORAL | Status: AC
  Filled 2017-02-20: qty 2

## 2017-02-20 MED ORDER — ACETAMINOPHEN 325 MG PO TABS
650.0000 mg | ORAL_TABLET | Freq: Once | ORAL | Status: AC
Start: 2017-02-20 — End: 2017-02-20
  Administered 2017-02-20: 650 mg via ORAL

## 2017-02-20 NOTE — Progress Notes (Signed)
Patient Care Team: [name], MD as PCP - General (Internal Medicine)  DIAGNOSIS:  Encounter Diagnoses  Name Primary?  . Metastases to the liver ([place]) Yes  . Metastatic breast cancer ([place])     SUMMARY OF ONCOLOGIC HISTORY:   Metastatic breast cancer ([place])   06/05/2006 Initial Diagnosis    Left breast biopsy: DCIS with apocrine features ER 30%, PR 13%      06/19/2006 Surgery    Left lumpectomy: stage I, pT1a, pN0(i)(sn), pMX, 0.3 cm IDC, grade 1, with extensive high-grade DCIS , margins negative ER 38 %, PR 93 %, Ki-67 10%, HER-2/neu 2+, no additional tissue available for FISH testing, with 0/1 left axillary lymph nodes.      07/30/2006 - 09/17/2006 Radiation Therapy    Adjuvant radiation therapy      09/24/2006 - 09/25/2011 Anti-estrogen oral therapy    Antiestrogen therapy with Arimidex 5 years      04/26/2016 Relapse/Recurrence    Innumerable hepatic lesions seen in both lobes most are 1-2 cm size somewhat confluent largest in the posterior right hepatic dome 3 x 4.6 cm      05/07/2016 PET scan    PET/CT scan: Extensive metastatic involvement of both lobes of the liver, primary could be liver, phalangeal or metastatic disease, hypermetabolic thoracic LN left axillary, left supraclavicular and right CP angle LN, hypermetabolic upper abdominal LN      05/16/2016 Initial Biopsy    Liver biopsy: Metastatic carcinoma breast primary strong positivity for CK 7 weak focal positivity for ER, GCDFP, negative for PR, CDX 2, TTF-1, WT 1; HER-2 positive      06/13/2016 -  Chemotherapy    Taxotere Herceptin and Perjeta every 3 weeks (Taxotere was held for elevated LFTs for cycles 1 and 2)        CHIEF COMPLIANT: Herceptin maintenance   INTERVAL HISTORY: Monique Figueroa is a 83 year old with above-mentioned history metastatic breast cancer currently on Herceptin maintenance therapy. She is tolerating it fairly well. She has a very occasional loose stool. She denies any abdominal  pain nausea vomiting diarrhea or constipation. She is staying very active raising her [place] and and shopping and doing everything that she normally does.  REVIEW OF SYSTEMS:   Constitutional: Denies fevers, chills or abnormal weight loss Eyes: Denies blurriness of vision Ears, nose, mouth, throat, and face: Denies mucositis or sore throat Respiratory: Denies cough, dyspnea or wheezes Cardiovascular: Denies palpitation, chest discomfort Gastrointestinal:  Denies nausea, heartburn or change in bowel habits Skin: Denies abnormal skin rashes Lymphatics: Denies new lymphadenopathy or easy bruising Neurological:Denies numbness, tingling or new weaknesses Behavioral/Psych: Mood is stable, no new changes  Extremities: No lower extremity edema Breast:  denies any pain or lumps or nodules in either breasts All other systems were reviewed with the patient and are negative.  I have reviewed the past medical history, past surgical history, social history and family history with the patient and they are unchanged from previous note.  ALLERGIES:  is allergic to perjeta [pertuzumab] and sulfa antibiotics.  MEDICATIONS:  Current Outpatient Prescriptions  Medication Sig Dispense Refill  . hydrochlorothiazide (HYDRODIURIL) 25 MG tablet Take 25 mg by mouth daily.     Marland Kitchen HYDROcodone-acetaminophen (NORCO/VICODIN) 5-325 MG tablet Take 1-2 tablets by mouth every 4 (four) hours as needed for moderate pain or severe pain. 40 tablet 0  . levothyroxine (SYNTHROID, LEVOTHROID) 125 MCG tablet Take 125 mcg by mouth daily before breakfast.     . loratadine (CLARITIN) 10 MG  tablet Take 10 mg by mouth daily as needed for allergies.    . metFORMIN (GLUCOPHAGE) 500 MG tablet Take 500 mg by mouth daily with breakfast.     . Multiple Vitamins-Minerals (MULTIVITAMIN WITH MINERALS) tablet Take 1 tablet by mouth daily.    Marland Kitchen omeprazole (PRILOSEC OTC) 20 MG tablet Take 20 mg by mouth daily.    . ranitidine (ZANTAC) 150 MG  tablet Take 150 mg by mouth at bedtime.    . vitamin B-12 (CYANOCOBALAMIN) 500 MCG tablet Take 500 mcg by mouth daily as needed.    . vitamin C (ASCORBIC ACID) 500 MG tablet Take 500 mg by mouth daily.     No current facility-administered medications for this visit.     PHYSICAL EXAMINATION: ECOG PERFORMANCE STATUS: 1 - Symptomatic but completely ambulatory  Vitals:   02/20/17 0906  BP: (!) 145/85  Pulse: 83  Resp: 18  Temp: 98.2 F (36.8 C)   Filed Weights   02/20/17 0906  Weight: 171 lb 3.2 oz (77.7 kg)    GENERAL:alert, no distress and comfortable SKIN: skin color, texture, turgor are normal, no rashes or significant lesions EYES: normal, Conjunctiva are pink and non-injected, sclera clear OROPHARYNX:no exudate, no erythema and lips, buccal mucosa, and tongue normal  NECK: supple, thyroid normal size, non-tender, without nodularity LYMPH:  no palpable lymphadenopathy in the cervical, axillary or inguinal LUNGS: clear to auscultation and percussion with normal breathing effort HEART: regular rate & rhythm and no murmurs and no lower extremity edema ABDOMEN:abdomen soft, non-tender and normal bowel sounds MUSCULOSKELETAL:no cyanosis of digits and no clubbing  NEURO: alert & oriented x 3 with fluent speech, no focal motor/sensory deficits EXTREMITIES: No lower extremity edema   LABORATORY DATA:  I have reviewed the data as listed   Chemistry      Component Value Date/Time   NA 143 01/09/2017 0924   K 3.7 01/09/2017 0924   CL 105 05/16/2016 1254   CL 104 06/09/2012 1402   CO2 29 01/09/2017 0924   BUN 19.9 01/09/2017 0924   CREATININE 0.8 01/09/2017 0924      Component Value Date/Time   CALCIUM 9.3 01/09/2017 0924   ALKPHOS 67 01/09/2017 0924   AST 22 01/09/2017 0924   ALT 11 01/09/2017 0924   BILITOT 0.46 01/09/2017 0924       Lab Results  Component Value Date   WBC 4.1 01/09/2017   HGB 12.3 01/09/2017   HCT 37.0 01/09/2017   MCV 94.9 01/09/2017   PLT  170 01/09/2017   NEUTROABS 2.4 01/09/2017    ASSESSMENT & PLAN:  Metastatic breast cancer ([place]) Metastatic breast cancer with innumerable liver metastases detected on ultrasound and recent MRI of the liver on 04/26/2016 (Prior history of left breast IDC T1 1 N0 stage IA 0.3 cm grade 1 tumor that was ER 38%, PR 93%, Ki-67 10%, HER-2 2+, no tissue for FISH testing, 0/1 lymph node negative, status post lumpectomy radiation and 5 years of Arimidex completed in February 2013)  PET/CT scan: 05/07/2016: Extensive metastatic involvement of both lobes of the liver, primary could be liver, phalangeal or metastatic disease, hypermetabolic thoracic lymph nodes left axillary, left supraclavicular and right CP angle lymph nodes, hypermetabolic upper abdominal lymph nodes  Liver biopsy10/07/2016: Metastatic carcinoma breast primary strong positivity for CK 7 weak focal positivity for ER, GCDFP, negative for PR, CDX 2, TTF-1, WT 1; HER-2 positive  Treatment plan: 1. Taxotere Herceptin and Perjeta every 3 weeks palliative chemotherapy started 06/13/16 (Perjeta  discontinued after 5 cycles due to allergy) 2. followed by Herceptin maintenance  ----------------------------------------------------------------------------------------------------------------------------------------- Current Treatment:Herceptin maintenance Peripheral neuropathy: Not bothering her, Blindness that was transient: on 81 mg aspirin daily.  Goals of treatment: Palliation of symptoms and prolongation of life Continue Herceptin maintenance indefinitely as long as she is responding.  Return to clinic in 6 weeks after scans for follow-up. Our plans to obtain scans in September.   I spent 25 minutes talking to the patient of which more than half was spent in counseling and coordination of care.  Orders Placed This Encounter  Procedures  . CT Chest W Contrast    Standing Status:   Future    Standing Expiration Date:   02/20/2018      Order Specific Question:   If indicated for the ordered procedure, I authorize the administration of contrast media per Radiology protocol    Answer:   Yes    Order Specific Question:   Reason for Exam (SYMPTOM  OR DIAGNOSIS REQUIRED)    Answer:   Metastatic breast cancer restaging    Order Specific Question:   Preferred imaging location?    Answer:   [facility]    Order Specific Question:   Radiology Contrast Protocol - do NOT remove file path    Answer:   \\[email or web address]  . CT Abdomen Pelvis W Contrast    Standing Status:   Future    Standing Expiration Date:   02/20/2018    Order Specific Question:   If indicated for the ordered procedure, I authorize the administration of contrast media per Radiology protocol    Answer:   Yes    Order Specific Question:   Reason for Exam (SYMPTOM  OR DIAGNOSIS REQUIRED)    Answer:   Metastatic breast cancer restaging    Order Specific Question:   Preferred imaging location?    Answer:   [facility]    Order Specific Question:   Radiology Contrast Protocol - do NOT remove file path    Answer:   \\[email or web address]  . CBC with Differential    Standing Status:   Standing    Number of Occurrences:   20    Standing Expiration Date:   02/20/2018  . Comprehensive metabolic panel    Standing Status:   Standing    Number of Occurrences:   20    Standing Expiration Date:   02/20/2018   The patient has a good understanding of the overall plan. she agrees with it. she will call with any problems that may develop before the next visit here.   Rulon Eisenmenger, MD 02/20/17

## 2017-02-20 NOTE — Patient Instructions (Signed)
[facility] Discharge Instructions for Patients Receiving Chemotherapy  Today you received the following chemotherapy agents: Herceptin   To help prevent nausea and vomiting after your treatment, we encourage you to take your nausea medication as directed.    If you develop nausea and vomiting that is not controlled by your nausea medication, call the clinic.   BELOW ARE SYMPTOMS THAT SHOULD BE REPORTED IMMEDIATELY:  *FEVER GREATER THAN 100.5 F  *CHILLS WITH OR WITHOUT FEVER  NAUSEA AND VOMITING THAT IS NOT CONTROLLED WITH YOUR NAUSEA MEDICATION  *UNUSUAL SHORTNESS OF BREATH  *UNUSUAL BRUISING OR BLEEDING  TENDERNESS IN MOUTH AND THROAT WITH OR WITHOUT PRESENCE OF ULCERS  *URINARY PROBLEMS  *BOWEL PROBLEMS  UNUSUAL RASH Items with * indicate a potential emergency and should be followed up as soon as possible.  Feel free to call the clinic you have any questions or concerns. The clinic phone number is [phone].  Please show the CHEMO ALERT CARD at check-in to the Emergency Department and triage nurse.   

## 2017-02-20 NOTE — Assessment & Plan Note (Signed)
Metastatic breast cancer with innumerable liver metastases detected on ultrasound and recent MRI of the liver on 04/26/2016 (Prior history of left breast IDC T1 1 N0 stage IA 0.3 cm grade 1 tumor that was ER 38%, PR 93%, Ki-67 10%, HER-2 2+, no tissue for FISH testing, 0/1 lymph node negative, status post lumpectomy radiation and 5 years of Arimidex completed in February 2013)  PET/CT scan: 05/07/2016: Extensive metastatic involvement of both lobes of the liver, primary could be liver, phalangeal or metastatic disease, hypermetabolic thoracic lymph nodes left axillary, left supraclavicular and right CP angle lymph nodes, hypermetabolic upper abdominal lymph nodes  Liver biopsy10/07/2016: Metastatic carcinoma breast primary strong positivity for CK 7 weak focal positivity for ER, GCDFP, negative for PR, CDX 2, TTF-1, WT 1; HER-2 positive  Treatment plan: 1. Taxotere Herceptin and Perjeta every 3 weeks palliative chemotherapy started 06/13/16 (Perjeta discontinued after 5 cycles due to allergy) 2. followed by Herceptin maintenance  ----------------------------------------------------------------------------------------------------------------------------------------- Current Treatment:Herceptin maintenance Peripheral neuropathy: Not bothering her, Blindness that was transient: on 81 mg aspirin daily.  Goals of treatment: Palliation of symptoms and prolongation of life Continue Herceptin maintenance indefinitely as long as she is responding.  Return to clinic in 6 weeks after scans for follow-up. Our plans to obtain scans in September.

## 2017-03-13 ENCOUNTER — Ambulatory Visit (HOSPITAL_BASED_OUTPATIENT_CLINIC_OR_DEPARTMENT_OTHER): Payer: Medicare Other

## 2017-03-13 ENCOUNTER — Other Ambulatory Visit: Payer: Medicare Other

## 2017-03-13 VITALS — BP 139/66 | HR 79 | Temp 98.0°F | Resp 17

## 2017-03-13 DIAGNOSIS — C787 Secondary malignant neoplasm of liver and intrahepatic bile duct: Secondary | ICD-10-CM | POA: Diagnosis not present

## 2017-03-13 DIAGNOSIS — Z5112 Encounter for antineoplastic immunotherapy: Secondary | ICD-10-CM | POA: Diagnosis not present

## 2017-03-13 DIAGNOSIS — C50919 Malignant neoplasm of unspecified site of unspecified female breast: Secondary | ICD-10-CM | POA: Diagnosis not present

## 2017-03-13 MED ORDER — DIPHENHYDRAMINE HCL 25 MG PO CAPS
50.0000 mg | ORAL_CAPSULE | Freq: Once | ORAL | Status: AC
  Administered 2017-03-13: 50 mg via ORAL

## 2017-03-13 MED ORDER — SODIUM CHLORIDE 0.9 % IV SOLN
Freq: Once | INTRAVENOUS | Status: AC
  Administered 2017-03-13: 11:00:00 via INTRAVENOUS

## 2017-03-13 MED ORDER — ACETAMINOPHEN 325 MG PO TABS
650.0000 mg | ORAL_TABLET | Freq: Once | ORAL | Status: AC
  Administered 2017-03-13: 650 mg via ORAL

## 2017-03-13 MED ORDER — HEPARIN SOD (PORK) LOCK FLUSH 100 UNIT/ML IV SOLN
500.0000 [IU] | Freq: Once | INTRAVENOUS | Status: AC | PRN
  Administered 2017-03-13: 500 [IU]
  Filled 2017-03-13: qty 5

## 2017-03-13 MED ORDER — TRASTUZUMAB CHEMO 150 MG IV SOLR
450.0000 mg | Freq: Once | INTRAVENOUS | Status: AC
  Administered 2017-03-13: 450 mg via INTRAVENOUS
  Filled 2017-03-13: qty 21.43

## 2017-03-13 MED ORDER — ACETAMINOPHEN 325 MG PO TABS
ORAL_TABLET | ORAL | Status: AC
  Filled 2017-03-13: qty 2

## 2017-03-13 MED ORDER — DIPHENHYDRAMINE HCL 25 MG PO CAPS
ORAL_CAPSULE | ORAL | Status: AC
  Filled 2017-03-13: qty 2

## 2017-03-13 MED ORDER — SODIUM CHLORIDE 0.9% FLUSH
10.0000 mL | INTRAVENOUS | Status: DC | PRN
  Administered 2017-03-13: 10 mL
  Filled 2017-03-13: qty 10

## 2017-03-13 NOTE — Patient Instructions (Signed)
[facility] Discharge Instructions for Patients Receiving Chemotherapy  Today you received the following chemotherapy agents Herceptin  To help prevent nausea and vomiting after your treatment, we encourage you to take your nausea medication  As directed   If you develop nausea and vomiting that is not controlled by your nausea medication, call the clinic.   BELOW ARE SYMPTOMS THAT SHOULD BE REPORTED IMMEDIATELY:  *FEVER GREATER THAN 100.5 F  *CHILLS WITH OR WITHOUT FEVER  NAUSEA AND VOMITING THAT IS NOT CONTROLLED WITH YOUR NAUSEA MEDICATION  *UNUSUAL SHORTNESS OF BREATH  *UNUSUAL BRUISING OR BLEEDING  TENDERNESS IN MOUTH AND THROAT WITH OR WITHOUT PRESENCE OF ULCERS  *URINARY PROBLEMS  *BOWEL PROBLEMS  UNUSUAL RASH Items with * indicate a potential emergency and should be followed up as soon as possible.  Feel free to call the clinic you have any questions or concerns. The clinic phone number is [phone].  Please show the CHEMO ALERT CARD at check-in to the Emergency Department and triage nurse.   

## 2017-04-02 ENCOUNTER — Ambulatory Visit (HOSPITAL_COMMUNITY)
Admission: RE | Admit: 2017-04-02 | Discharge: 2017-04-02 | Disposition: A | Discharge status: Home / Self Care | Payer: Medicare Other | Source: Ambulatory Visit | Attending: Hematology and Oncology | Admitting: Hematology and Oncology

## 2017-04-02 DIAGNOSIS — D259 Leiomyoma of uterus, unspecified: Secondary | ICD-10-CM | POA: Insufficient documentation

## 2017-04-02 DIAGNOSIS — C50919 Malignant neoplasm of unspecified site of unspecified female breast: Secondary | ICD-10-CM | POA: Insufficient documentation

## 2017-04-02 DIAGNOSIS — K5730 Diverticulosis of large intestine without perforation or abscess without bleeding: Secondary | ICD-10-CM | POA: Insufficient documentation

## 2017-04-02 DIAGNOSIS — C787 Secondary malignant neoplasm of liver and intrahepatic bile duct: Secondary | ICD-10-CM | POA: Diagnosis not present

## 2017-04-02 DIAGNOSIS — I700 Atherosclerosis of aorta: Secondary | ICD-10-CM | POA: Diagnosis not present

## 2017-04-02 MED ORDER — IOPAMIDOL (ISOVUE-300) INJECTION 61%
INTRAVENOUS | Status: AC
  Filled 2017-04-02: qty 100

## 2017-04-02 MED ORDER — IOPAMIDOL (ISOVUE-300) INJECTION 61%
100.0000 mL | Freq: Once | INTRAVENOUS | Status: AC | PRN
  Administered 2017-04-02: 100 mL via INTRAVENOUS

## 2017-04-03 ENCOUNTER — Ambulatory Visit: Payer: Medicare Other

## 2017-04-03 ENCOUNTER — Other Ambulatory Visit (HOSPITAL_BASED_OUTPATIENT_CLINIC_OR_DEPARTMENT_OTHER): Payer: Medicare Other

## 2017-04-03 ENCOUNTER — Ambulatory Visit (HOSPITAL_BASED_OUTPATIENT_CLINIC_OR_DEPARTMENT_OTHER): Payer: Medicare Other

## 2017-04-03 ENCOUNTER — Encounter: Payer: Self-pay | Admitting: Hematology and Oncology

## 2017-04-03 ENCOUNTER — Ambulatory Visit (HOSPITAL_BASED_OUTPATIENT_CLINIC_OR_DEPARTMENT_OTHER): Payer: Medicare Other | Admitting: Hematology and Oncology

## 2017-04-03 DIAGNOSIS — Z5112 Encounter for antineoplastic immunotherapy: Secondary | ICD-10-CM | POA: Diagnosis not present

## 2017-04-03 DIAGNOSIS — C50919 Malignant neoplasm of unspecified site of unspecified female breast: Secondary | ICD-10-CM | POA: Diagnosis not present

## 2017-04-03 DIAGNOSIS — C787 Secondary malignant neoplasm of liver and intrahepatic bile duct: Secondary | ICD-10-CM | POA: Diagnosis not present

## 2017-04-03 DIAGNOSIS — Z95828 Presence of other vascular implants and grafts: Secondary | ICD-10-CM | POA: Insufficient documentation

## 2017-04-03 LAB — COMPREHENSIVE METABOLIC PANEL
ALT: 9 U/L (ref 0–55)
AST: 20 U/L (ref 5–34)
Albumin: 3.5 g/dL (ref 3.5–5.0)
Alkaline Phosphatase: 76 U/L (ref 40–150)
Anion Gap: 10 mEq/L (ref 3–11)
BILIRUBIN TOTAL: 0.4 mg/dL (ref 0.20–1.20)
BUN: 15.9 mg/dL (ref 7.0–26.0)
CO2: 26 meq/L (ref 22–29)
Calcium: 9.2 mg/dL (ref 8.4–10.4)
Chloride: 104 mEq/L (ref 98–109)
Creatinine: 0.8 mg/dL (ref 0.6–1.1)
EGFR: 69 mL/min/{1.73_m2} — AB (ref >90)
GLUCOSE: 194 mg/dL — AB (ref 70–140)
POTASSIUM: 3.6 meq/L (ref 3.5–5.1)
SODIUM: 140 meq/L (ref 136–145)
TOTAL PROTEIN: 6.9 g/dL (ref 6.4–8.3)

## 2017-04-03 LAB — CBC WITH DIFFERENTIAL/PLATELET
BASO%: 0.8 % (ref 0.0–2.0)
Basophils Absolute: 0 10*3/uL (ref 0.0–0.1)
EOS ABS: 0.1 10*3/uL (ref 0.0–0.5)
EOS%: 2.6 % (ref 0.0–7.0)
HCT: 36.5 % (ref 34.8–46.6)
HGB: 12.5 g/dL (ref 11.6–15.9)
LYMPH%: 22 % (ref 14.0–49.7)
MCH: 32.3 pg (ref 25.1–34.0)
MCHC: 34.2 g/dL (ref 31.5–36.0)
MCV: 94.4 fL (ref 79.5–101.0)
MONO#: 0.3 10*3/uL (ref 0.1–0.9)
MONO%: 7.7 % (ref 0.0–14.0)
NEUT#: 3 10*3/uL (ref 1.5–6.5)
NEUT%: 66.9 % (ref 38.4–76.8)
Platelets: 188 10*3/uL (ref 145–400)
RBC: 3.87 10*6/uL (ref 3.70–5.45)
RDW: 13.8 % (ref 11.2–14.5)
WBC: 4.4 10*3/uL (ref 3.9–10.3)
lymph#: 1 10*3/uL (ref 0.9–3.3)

## 2017-04-03 MED ORDER — SODIUM CHLORIDE 0.9 % IV SOLN
Freq: Once | INTRAVENOUS | Status: AC
  Administered 2017-04-03: 10:00:00 via INTRAVENOUS

## 2017-04-03 MED ORDER — DIPHENHYDRAMINE HCL 25 MG PO CAPS
50.0000 mg | ORAL_CAPSULE | Freq: Once | ORAL | Status: AC
  Administered 2017-04-03: 50 mg via ORAL

## 2017-04-03 MED ORDER — SODIUM CHLORIDE 0.9% FLUSH
10.0000 mL | INTRAVENOUS | Status: DC | PRN
  Administered 2017-04-03: 10 mL via INTRAVENOUS
  Filled 2017-04-03: qty 10

## 2017-04-03 MED ORDER — DIPHENHYDRAMINE HCL 25 MG PO CAPS
ORAL_CAPSULE | ORAL | Status: AC
  Filled 2017-04-03: qty 2

## 2017-04-03 MED ORDER — ACETAMINOPHEN 325 MG PO TABS
ORAL_TABLET | ORAL | Status: AC
  Filled 2017-04-03: qty 2

## 2017-04-03 MED ORDER — SODIUM CHLORIDE 0.9% FLUSH
10.0000 mL | INTRAVENOUS | Status: DC | PRN
  Administered 2017-04-03: 10 mL
  Filled 2017-04-03: qty 10

## 2017-04-03 MED ORDER — HEPARIN SOD (PORK) LOCK FLUSH 100 UNIT/ML IV SOLN
500.0000 [IU] | Freq: Once | INTRAVENOUS | Status: AC | PRN
  Administered 2017-04-03: 500 [IU]
  Filled 2017-04-03: qty 5

## 2017-04-03 MED ORDER — TRASTUZUMAB CHEMO 150 MG IV SOLR
450.0000 mg | Freq: Once | INTRAVENOUS | Status: AC
  Administered 2017-04-03: 450 mg via INTRAVENOUS
  Filled 2017-04-03: qty 21.43

## 2017-04-03 MED ORDER — ACETAMINOPHEN 325 MG PO TABS
650.0000 mg | ORAL_TABLET | Freq: Once | ORAL | Status: AC
  Administered 2017-04-03: 650 mg via ORAL

## 2017-04-03 NOTE — Progress Notes (Signed)
Patient Care Team: [name], MD as PCP - General (Internal Medicine)  DIAGNOSIS:  Encounter Diagnosis  Name Primary?  . Metastatic breast cancer ([place])     SUMMARY OF ONCOLOGIC HISTORY:   Metastatic breast cancer ([place])   06/05/2006 Initial Diagnosis    Left breast biopsy: DCIS with apocrine features ER 30%, PR 13%      06/19/2006 Surgery    Left lumpectomy: stage I, pT1a, pN0(i)(sn), pMX, 0.3 cm IDC, grade 1, with extensive high-grade DCIS , margins negative ER 38 %, PR 93 %, Ki-67 10%, HER-2/neu 2+, no additional tissue available for FISH testing, with 0/1 left axillary lymph nodes.      07/30/2006 - 09/17/2006 Radiation Therapy    Adjuvant radiation therapy      09/24/2006 - 09/25/2011 Anti-estrogen oral therapy    Antiestrogen therapy with Arimidex 5 years      04/26/2016 Relapse/Recurrence    Innumerable hepatic lesions seen in both lobes most are 1-2 cm size somewhat confluent largest in the posterior right hepatic dome 3 x 4.6 cm      05/07/2016 PET scan    PET/CT scan: Extensive metastatic involvement of both lobes of the liver, primary could be liver, phalangeal or metastatic disease, hypermetabolic thoracic LN left axillary, left supraclavicular and right CP angle LN, hypermetabolic upper abdominal LN      05/16/2016 Initial Biopsy    Liver biopsy: Metastatic carcinoma breast primary strong positivity for CK 7 weak focal positivity for ER, GCDFP, negative for PR, CDX 2, TTF-1, WT 1; HER-2 positive      06/13/2016 -  Chemotherapy    Taxotere Herceptin and Perjeta every 3 weeks (Taxotere was held for elevated LFTs for cycles 1 and 2)        CHIEF COMPLIANT: Follow-up after recent scans on Herceptin  INTERVAL HISTORY: Monique Figueroa is a 83 year old with above-mentioned symptoms metastatic breast cancer with liver metastases is currently on Herceptin maintenance therapy. She is tolerating it fairly well. Does not have any nausea or diarrhea or any other  issues. She stays extremely busy at home and tries to do lots of projects. She had recent CT scans and is here to discuss those results.  REVIEW OF SYSTEMS:   Constitutional: Denies fevers, chills or abnormal weight loss Eyes: Denies blurriness of vision Ears, nose, mouth, throat, and face: Denies mucositis or sore throat Respiratory: Denies cough, dyspnea or wheezes Cardiovascular: Denies palpitation, chest discomfort Gastrointestinal:  Denies nausea, heartburn or change in bowel habits Skin: Denies abnormal skin rashes Lymphatics: Denies new lymphadenopathy or easy bruising Neurological:Denies numbness, tingling or new weaknesses Behavioral/Psych: Mood is stable, no new changes  Extremities: No lower extremity edema breasts All other systems were reviewed with the patient and are negative.  I have reviewed the past medical history, past surgical history, social history and family history with the patient and they are unchanged from previous note.  ALLERGIES:  is allergic to perjeta [pertuzumab] and sulfa antibiotics.  MEDICATIONS:  Current Outpatient Prescriptions  Medication Sig Dispense Refill  . hydrochlorothiazide (HYDRODIURIL) 25 MG tablet Take 25 mg by mouth daily.     Marland Kitchen HYDROcodone-acetaminophen (NORCO/VICODIN) 5-325 MG tablet Take 1-2 tablets by mouth every 4 (four) hours as needed for moderate pain or severe pain. 40 tablet 0  . levothyroxine (SYNTHROID, LEVOTHROID) 125 MCG tablet Take 125 mcg by mouth daily before breakfast.     . loratadine (CLARITIN) 10 MG tablet Take 10 mg by mouth daily as needed for allergies.    Marland Kitchen  metFORMIN (GLUCOPHAGE) 500 MG tablet Take 500 mg by mouth daily with breakfast.     . Multiple Vitamins-Minerals (MULTIVITAMIN WITH MINERALS) tablet Take 1 tablet by mouth daily.    Marland Kitchen omeprazole (PRILOSEC OTC) 20 MG tablet Take 20 mg by mouth daily.    . ranitidine (ZANTAC) 150 MG tablet Take 150 mg by mouth at bedtime.    . vitamin B-12 (CYANOCOBALAMIN)  500 MCG tablet Take 500 mcg by mouth daily as needed.    . vitamin C (ASCORBIC ACID) 500 MG tablet Take 500 mg by mouth daily.     No current facility-administered medications for this visit.     PHYSICAL EXAMINATION: ECOG PERFORMANCE STATUS: 0 - Asymptomatic  Vitals:   04/03/17 0850  BP: (!) 166/81  Pulse: 74  Resp: 18  Temp: 98.1 F (36.7 C)  SpO2: 97%   Filed Weights   04/03/17 0850  Weight: 172 lb 12.8 oz (78.4 kg)    GENERAL:alert, no distress and comfortable SKIN: skin color, texture, turgor are normal, no rashes or significant lesions EYES: normal, Conjunctiva are pink and non-injected, sclera clear OROPHARYNX:no exudate, no erythema and lips, buccal mucosa, and tongue normal  NECK: supple, thyroid normal size, non-tender, without nodularity LYMPH:  no palpable lymphadenopathy in the cervical, axillary or inguinal LUNGS: clear to auscultation and percussion with normal breathing effort HEART: regular rate & rhythm and no murmurs and no lower extremity edema ABDOMEN:abdomen soft, non-tender and normal bowel sounds MUSCULOSKELETAL:no cyanosis of digits and no clubbing  NEURO: alert & oriented x 3 with fluent speech, no focal motor/sensory deficits EXTREMITIES: No lower extremity edema   LABORATORY DATA:  I have reviewed the data as listed   Chemistry      Component Value Date/Time   NA 140 02/20/2017 0946   K 3.8 02/20/2017 0946   CL 105 05/16/2016 1254   CL 104 06/09/2012 1402   CO2 26 02/20/2017 0946   BUN 22.7 02/20/2017 0946   CREATININE 0.8 02/20/2017 0946      Component Value Date/Time   CALCIUM 9.3 02/20/2017 0946   ALKPHOS 73 02/20/2017 0946   AST 24 02/20/2017 0946   ALT 11 02/20/2017 0946   BILITOT 0.43 02/20/2017 0946       Lab Results  Component Value Date   WBC 4.4 04/03/2017   HGB 12.5 04/03/2017   HCT 36.5 04/03/2017   MCV 94.4 04/03/2017   PLT 188 04/03/2017   NEUTROABS 3.0 04/03/2017    ASSESSMENT & PLAN:  Metastatic breast  cancer ([place]) Metastatic breast cancer with innumerable liver metastases detected on ultrasound and recent MRI of the liver on 04/26/2016 (Prior history of left breast IDC T1 1 N0 stage IA 0.3 cm grade 1 tumor that was ER 38%, PR 93%, Ki-67 10%, HER-2 2+, no tissue for FISH testing, 0/1 lymph node negative, status post lumpectomy radiation and 5 years of Arimidex completed in February 2013)  PET/CT scan: 05/07/2016: Extensive metastatic involvement of both lobes of the liver, primary could be liver, phalangeal or metastatic disease, hypermetabolic thoracic lymph nodes left axillary, left supraclavicular and right CP angle lymph nodes, hypermetabolic upper abdominal lymph nodes  Liver biopsy10/07/2016: Metastatic carcinoma breast primary strong positivity for CK 7 weak focal positivity for ER, GCDFP, negative for PR, CDX 2, TTF-1, WT 1; HER-2 positive  Treatment plan: 1. Taxotere Herceptin and Perjeta every 3 weeks palliative chemotherapy started 06/13/16 (Perjeta discontinued after 5 cycles due to allergy) 2. followed by Herceptin maintenance  ----------------------------------------------------------------------------------------------------------------------------------------- Current  Treatment:Herceptin maintenance Peripheral neuropathy: Not bothering her, Blindness that was transient: on81 mg aspirin daily.  Goals of treatment: Palliation of symptoms and prolongation of life Continue Herceptin maintenance indefinitely as long as she is responding. CT chest abdomen pelvis 04/02/2017: Liver metastases is improving largest lesion decreased from 10.1 cm to 6.6 cm  Patient stays very busy doing chores at home, weeding, painting etc. Return to clinic in 9 weeks for follow-up every 3 weeks for Herceptin.   I spent 25 minutes talking to the patient of which more than half was spent in counseling and coordination of care.  No orders of the defined types were placed in this  encounter.  The patient has a good understanding of the overall plan. she agrees with it. she will call with any problems that may develop before the next visit here.   Rulon Eisenmenger, MD 04/03/17

## 2017-04-03 NOTE — Patient Instructions (Signed)
[facility] Discharge Instructions for Patients Receiving Chemotherapy  Today you received the following chemotherapy agents:  Herceptin  To help prevent nausea and vomiting after your treatment, we encourage you to take your nausea medication as prescribed.   If you develop nausea and vomiting that is not controlled by your nausea medication, call the clinic.   BELOW ARE SYMPTOMS THAT SHOULD BE REPORTED IMMEDIATELY:  *FEVER GREATER THAN 100.5 F  *CHILLS WITH OR WITHOUT FEVER  NAUSEA AND VOMITING THAT IS NOT CONTROLLED WITH YOUR NAUSEA MEDICATION  *UNUSUAL SHORTNESS OF BREATH  *UNUSUAL BRUISING OR BLEEDING  TENDERNESS IN MOUTH AND THROAT WITH OR WITHOUT PRESENCE OF ULCERS  *URINARY PROBLEMS  *BOWEL PROBLEMS  UNUSUAL RASH Items with * indicate a potential emergency and should be followed up as soon as possible.  Feel free to call the clinic you have any questions or concerns. The clinic phone number is [phone].  Please show the CHEMO ALERT CARD at check-in to the Emergency Department and triage nurse.   

## 2017-04-03 NOTE — Assessment & Plan Note (Signed)
Metastatic breast cancer with innumerable liver metastases detected on ultrasound and recent MRI of the liver on 04/26/2016 (Prior history of left breast IDC T1 1 N0 stage IA 0.3 cm grade 1 tumor that was ER 38%, PR 93%, Ki-67 10%, HER-2 2+, no tissue for FISH testing, 0/1 lymph node negative, status post lumpectomy radiation and 5 years of Arimidex completed in February 2013)  PET/CT scan: 05/07/2016: Extensive metastatic involvement of both lobes of the liver, primary could be liver, phalangeal or metastatic disease, hypermetabolic thoracic lymph nodes left axillary, left supraclavicular and right CP angle lymph nodes, hypermetabolic upper abdominal lymph nodes  Liver biopsy10/07/2016: Metastatic carcinoma breast primary strong positivity for CK 7 weak focal positivity for ER, GCDFP, negative for PR, CDX 2, TTF-1, WT 1; HER-2 positive  Treatment plan: 1. Taxotere Herceptin and Perjeta every 3 weeks palliative chemotherapy started 06/13/16 (Perjeta discontinued after 5 cycles due to allergy) 2. followed by Herceptin maintenance  ----------------------------------------------------------------------------------------------------------------------------------------- Current Treatment:Herceptin maintenance Peripheral neuropathy: Not bothering her, Blindness that was transient: on81 mg aspirin daily.  Goals of treatment: Palliation of symptoms and prolongation of life Continue Herceptin maintenance indefinitely as long as she is responding.  Return to clinic in 6 weeks after scans for follow-up. Our plans to obtain scans in September.

## 2017-04-24 ENCOUNTER — Ambulatory Visit (HOSPITAL_BASED_OUTPATIENT_CLINIC_OR_DEPARTMENT_OTHER): Payer: Medicare Other

## 2017-04-24 ENCOUNTER — Other Ambulatory Visit: Payer: Self-pay

## 2017-04-24 ENCOUNTER — Telehealth: Payer: Self-pay

## 2017-04-24 VITALS — BP 136/74 | HR 73 | Temp 98.1°F | Resp 18

## 2017-04-24 DIAGNOSIS — C787 Secondary malignant neoplasm of liver and intrahepatic bile duct: Secondary | ICD-10-CM | POA: Diagnosis not present

## 2017-04-24 DIAGNOSIS — C50919 Malignant neoplasm of unspecified site of unspecified female breast: Secondary | ICD-10-CM | POA: Diagnosis not present

## 2017-04-24 DIAGNOSIS — Z5112 Encounter for antineoplastic immunotherapy: Secondary | ICD-10-CM | POA: Diagnosis not present

## 2017-04-24 DIAGNOSIS — Z79899 Other long term (current) drug therapy: Secondary | ICD-10-CM

## 2017-04-24 DIAGNOSIS — Z5181 Encounter for therapeutic drug level monitoring: Secondary | ICD-10-CM

## 2017-04-24 MED ORDER — ACETAMINOPHEN 325 MG PO TABS
650.0000 mg | ORAL_TABLET | Freq: Once | ORAL | Status: AC
  Administered 2017-04-24: 650 mg via ORAL

## 2017-04-24 MED ORDER — HEPARIN SOD (PORK) LOCK FLUSH 100 UNIT/ML IV SOLN
500.0000 [IU] | Freq: Once | INTRAVENOUS | Status: AC | PRN
  Administered 2017-04-24: 500 [IU]
  Filled 2017-04-24: qty 5

## 2017-04-24 MED ORDER — ACETAMINOPHEN 325 MG PO TABS
ORAL_TABLET | ORAL | Status: AC
  Filled 2017-04-24: qty 2

## 2017-04-24 MED ORDER — SODIUM CHLORIDE 0.9 % IV SOLN
Freq: Once | INTRAVENOUS | Status: AC
  Administered 2017-04-24: 10:00:00 via INTRAVENOUS

## 2017-04-24 MED ORDER — DIPHENHYDRAMINE HCL 25 MG PO CAPS
ORAL_CAPSULE | ORAL | Status: AC
  Filled 2017-04-24: qty 2

## 2017-04-24 MED ORDER — DIPHENHYDRAMINE HCL 25 MG PO CAPS
50.0000 mg | ORAL_CAPSULE | Freq: Once | ORAL | Status: AC
  Administered 2017-04-24: 50 mg via ORAL

## 2017-04-24 MED ORDER — TRASTUZUMAB CHEMO 150 MG IV SOLR
450.0000 mg | Freq: Once | INTRAVENOUS | Status: AC
  Administered 2017-04-24: 450 mg via INTRAVENOUS
  Filled 2017-04-24: qty 21.43

## 2017-04-24 MED ORDER — SODIUM CHLORIDE 0.9% FLUSH
10.0000 mL | INTRAVENOUS | Status: DC | PRN
  Administered 2017-04-24: 10 mL
  Filled 2017-04-24: qty 10

## 2017-04-24 NOTE — Patient Instructions (Signed)
[facility] Discharge Instructions for Patients Receiving Chemotherapy  Today you received the following chemotherapy agents: Herceptin   To help prevent nausea and vomiting after your treatment, we encourage you to take your nausea medication as directed.    If you develop nausea and vomiting that is not controlled by your nausea medication, call the clinic.   BELOW ARE SYMPTOMS THAT SHOULD BE REPORTED IMMEDIATELY:  *FEVER GREATER THAN 100.5 F  *CHILLS WITH OR WITHOUT FEVER  NAUSEA AND VOMITING THAT IS NOT CONTROLLED WITH YOUR NAUSEA MEDICATION  *UNUSUAL SHORTNESS OF BREATH  *UNUSUAL BRUISING OR BLEEDING  TENDERNESS IN MOUTH AND THROAT WITH OR WITHOUT PRESENCE OF ULCERS  *URINARY PROBLEMS  *BOWEL PROBLEMS  UNUSUAL RASH Items with * indicate a potential emergency and should be followed up as soon as possible.  Feel free to call the clinic you have any questions or concerns. The clinic phone number is [phone].  Please show the CHEMO ALERT CARD at check-in to the Emergency Department and triage nurse.   

## 2017-04-24 NOTE — Progress Notes (Signed)
Per Tiffany RN per Dr. [name] okay to proceed with Echo on 01/14/17.

## 2017-04-24 NOTE — Telephone Encounter (Signed)
Pt is currently in infusion informed infusion nurse of her Echo appt for Monday Sept 24th @ 9a. Infusion nurse is going to relay that appt information to the patient.

## 2017-04-28 ENCOUNTER — Ambulatory Visit (HOSPITAL_COMMUNITY)
Admission: RE | Admit: 2017-04-28 | Discharge: 2017-04-28 | Disposition: A | Discharge status: Home / Self Care | Payer: Medicare Other | Source: Ambulatory Visit | Attending: Hematology and Oncology | Admitting: Hematology and Oncology

## 2017-04-28 DIAGNOSIS — I081 Rheumatic disorders of both mitral and tricuspid valves: Secondary | ICD-10-CM | POA: Diagnosis not present

## 2017-04-28 DIAGNOSIS — Z79899 Other long term (current) drug therapy: Secondary | ICD-10-CM | POA: Diagnosis not present

## 2017-04-28 DIAGNOSIS — C50919 Malignant neoplasm of unspecified site of unspecified female breast: Secondary | ICD-10-CM | POA: Diagnosis not present

## 2017-04-28 DIAGNOSIS — Z5181 Encounter for therapeutic drug level monitoring: Secondary | ICD-10-CM

## 2017-04-28 NOTE — Progress Notes (Signed)
  Echocardiogram 2D Echocardiogram has been performed.  Darlina Sicilian M 04/28/2017, 11:21 AM

## 2017-05-15 ENCOUNTER — Telehealth: Payer: Self-pay | Admitting: *Deleted

## 2017-05-15 ENCOUNTER — Ambulatory Visit (HOSPITAL_BASED_OUTPATIENT_CLINIC_OR_DEPARTMENT_OTHER): Payer: Medicare Other

## 2017-05-15 ENCOUNTER — Other Ambulatory Visit: Payer: Self-pay | Admitting: *Deleted

## 2017-05-15 ENCOUNTER — Ambulatory Visit (HOSPITAL_COMMUNITY)
Admission: RE | Admit: 2017-05-15 | Discharge: 2017-05-15 | Disposition: A | Discharge status: Home / Self Care | Payer: Medicare Other | Source: Ambulatory Visit | Attending: Hematology and Oncology | Admitting: Hematology and Oncology

## 2017-05-15 ENCOUNTER — Ambulatory Visit: Payer: Medicare Other | Admitting: Hematology and Oncology

## 2017-05-15 ENCOUNTER — Other Ambulatory Visit: Payer: Medicare Other

## 2017-05-15 VITALS — BP 145/64 | HR 80 | Temp 98.2°F | Resp 16

## 2017-05-15 DIAGNOSIS — C50919 Malignant neoplasm of unspecified site of unspecified female breast: Secondary | ICD-10-CM | POA: Insufficient documentation

## 2017-05-15 DIAGNOSIS — Z5112 Encounter for antineoplastic immunotherapy: Secondary | ICD-10-CM

## 2017-05-15 DIAGNOSIS — M7989 Other specified soft tissue disorders: Secondary | ICD-10-CM | POA: Insufficient documentation

## 2017-05-15 DIAGNOSIS — C787 Secondary malignant neoplasm of liver and intrahepatic bile duct: Secondary | ICD-10-CM | POA: Diagnosis not present

## 2017-05-15 MED ORDER — SODIUM CHLORIDE 0.9% FLUSH
10.0000 mL | INTRAVENOUS | Status: DC | PRN
  Administered 2017-05-15: 10 mL
  Filled 2017-05-15: qty 10

## 2017-05-15 MED ORDER — SODIUM CHLORIDE 0.9 % IV SOLN
Freq: Once | INTRAVENOUS | Status: AC
  Administered 2017-05-15: 10:00:00 via INTRAVENOUS

## 2017-05-15 MED ORDER — ACETAMINOPHEN 325 MG PO TABS
ORAL_TABLET | ORAL | Status: AC
  Filled 2017-05-15: qty 2

## 2017-05-15 MED ORDER — TRASTUZUMAB CHEMO 150 MG IV SOLR
450.0000 mg | Freq: Once | INTRAVENOUS | Status: AC
  Administered 2017-05-15: 450 mg via INTRAVENOUS
  Filled 2017-05-15: qty 21.43

## 2017-05-15 MED ORDER — DIPHENHYDRAMINE HCL 25 MG PO CAPS
50.0000 mg | ORAL_CAPSULE | Freq: Once | ORAL | Status: AC
  Administered 2017-05-15: 50 mg via ORAL

## 2017-05-15 MED ORDER — HEPARIN SOD (PORK) LOCK FLUSH 100 UNIT/ML IV SOLN
500.0000 [IU] | Freq: Once | INTRAVENOUS | Status: AC | PRN
  Administered 2017-05-15: 500 [IU]
  Filled 2017-05-15: qty 5

## 2017-05-15 MED ORDER — DIPHENHYDRAMINE HCL 25 MG PO CAPS
ORAL_CAPSULE | ORAL | Status: AC
  Filled 2017-05-15: qty 2

## 2017-05-15 MED ORDER — ACETAMINOPHEN 325 MG PO TABS
650.0000 mg | ORAL_TABLET | Freq: Once | ORAL | Status: AC
  Administered 2017-05-15: 650 mg via ORAL

## 2017-05-15 NOTE — Telephone Encounter (Signed)
Received call from [place] stating that [place] was negative for DVT.  Informed Dr [name] & informed pt may go home.

## 2017-05-15 NOTE — Progress Notes (Signed)
Left message with Janifer Adie, RN with Dr. [name] that pt has c/o left arm sweeling x1week.  No other complaints/concerns.

## 2017-05-15 NOTE — Progress Notes (Signed)
*  PRELIMINARY RESULTS* Vascular Ultrasound Left upper ext venous duplex has been completed.  Preliminary findings: No evidence of DVT or superficial thrombosis.   Called results to [facility].  Landry Mellow, RDMS, RVT  05/15/2017, 1:25 PM

## 2017-05-15 NOTE — Patient Instructions (Signed)
[facility] Discharge Instructions for Patients Receiving Chemotherapy  Today you received the following chemotherapy agents herceptin   To help prevent nausea and vomiting after your treatment, we encourage you to take your nausea medication as directed   If you develop nausea and vomiting that is not controlled by your nausea medication, call the clinic.   BELOW ARE SYMPTOMS THAT SHOULD BE REPORTED IMMEDIATELY:  *FEVER GREATER THAN 100.5 F  *CHILLS WITH OR WITHOUT FEVER  NAUSEA AND VOMITING THAT IS NOT CONTROLLED WITH YOUR NAUSEA MEDICATION  *UNUSUAL SHORTNESS OF BREATH  *UNUSUAL BRUISING OR BLEEDING  TENDERNESS IN MOUTH AND THROAT WITH OR WITHOUT PRESENCE OF ULCERS  *URINARY PROBLEMS  *BOWEL PROBLEMS  UNUSUAL RASH Items with * indicate a potential emergency and should be followed up as soon as possible.  Feel free to call the clinic you have any questions or concerns. The clinic phone number is [phone].  

## 2017-05-15 NOTE — Telephone Encounter (Signed)
Received call this am from Nurse Loren in infusion room stating that pt has has some on & off swelling of her L arm x 1 wk & is noticeable today.  Discussed with Dr [name] & OK to treat with herceptin but pt should have [place] of L extremity.  Order placed.

## 2017-05-20 ENCOUNTER — Encounter: Payer: Self-pay | Admitting: Pharmacist

## 2017-05-20 DIAGNOSIS — C50919 Malignant neoplasm of unspecified site of unspecified female breast: Secondary | ICD-10-CM

## 2017-05-20 NOTE — Progress Notes (Signed)
Telephone documentation   Study code: [id number]   Attempted to call patient and follow-up with her about the "Pharmacogenetic analysis of toxicities related to administration of taxanes in breast cancer patients" study. Unable to reach patient. Provided my office number for the patient to call back.   Darl Pikes, PharmD, BCPS Hematology/Oncology Clinical Pharmacist [facility] [phone]

## 2017-05-21 NOTE — Progress Notes (Signed)
Telephone documentation  Study code: [id number]  Spoke with patient over the phone today and informed her that her buccal swab for "Pharmacogenetic analysis of toxicities related to administration of taxanes in breast cancer patients" study did NOT contain enough DNA to yield results. Provide my office number to the patient if she had additional questions.  Britaney Espaillat N. Roopa Graver, PharmD, BCPS Hematology/Oncology Clinical Pharmacist [facility] [phone]    

## 2017-06-04 NOTE — Assessment & Plan Note (Signed)
Metastatic breast cancer with innumerable liver metastases detected on ultrasound and recent MRI of the liver on 04/26/2016 (Prior history of left breast IDC T1 1 N0 stage IA 0.3 cm grade 1 tumor that was ER 38%, PR 93%, Ki-67 10%, HER-2 2+, no tissue for FISH testing, 0/1 lymph node negative, status post lumpectomy radiation and 5 years of Arimidex completed in February 2013)  PET/CT scan: 05/07/2016: Extensive metastatic involvement of both lobes of the liver, primary could be liver, phalangeal or metastatic disease, hypermetabolic thoracic lymph nodes left axillary, left supraclavicular and right CP angle lymph nodes, hypermetabolic upper abdominal lymph nodes  Liver biopsy10/07/2016: Metastatic carcinoma breast primary strong positivity for CK 7 weak focal positivity for ER, GCDFP, negative for PR, CDX 2, TTF-1, WT 1; HER-2 positive  Treatment plan: 1. Taxotere Herceptin and Perjeta every 3 weeks palliative chemotherapy started 06/13/16 (Perjeta discontinued after 5 cycles due to allergy) 2. followed by Herceptin maintenance  ----------------------------------------------------------------------------------------------------------------------------------------- Current Treatment:Herceptin maintenance Peripheral neuropathy: Not bothering her, Blindness that was transient: on81 mg aspirin daily.  Goals of treatment: Palliation of symptoms and prolongation of life Continue Herceptin maintenance indefinitely as long as she is responding. CT chest abdomen pelvis 04/02/2017: Liver metastases is improving largest lesion decreased from 10.1 cm to 6.6 cm  Patient stays very busy doing chores at home, weeding, painting etc. Return to clinic in 9 weeksfor follow-up every 3 weeks for Herceptin.   I spent 25 minutes talking to the patient of

## 2017-06-05 ENCOUNTER — Encounter: Payer: Self-pay | Admitting: Hematology and Oncology

## 2017-06-05 ENCOUNTER — Ambulatory Visit: Payer: Medicare Other

## 2017-06-05 ENCOUNTER — Telehealth: Payer: Self-pay | Admitting: Hematology and Oncology

## 2017-06-05 ENCOUNTER — Ambulatory Visit (HOSPITAL_BASED_OUTPATIENT_CLINIC_OR_DEPARTMENT_OTHER): Payer: Medicare Other

## 2017-06-05 ENCOUNTER — Other Ambulatory Visit (HOSPITAL_BASED_OUTPATIENT_CLINIC_OR_DEPARTMENT_OTHER): Payer: Medicare Other

## 2017-06-05 ENCOUNTER — Ambulatory Visit (HOSPITAL_BASED_OUTPATIENT_CLINIC_OR_DEPARTMENT_OTHER): Payer: Medicare Other | Admitting: Hematology and Oncology

## 2017-06-05 VITALS — BP 162/74 | HR 72 | Temp 98.0°F | Resp 17 | Ht 65.0 in | Wt 177.7 lb

## 2017-06-05 DIAGNOSIS — M7989 Other specified soft tissue disorders: Secondary | ICD-10-CM | POA: Diagnosis not present

## 2017-06-05 DIAGNOSIS — C787 Secondary malignant neoplasm of liver and intrahepatic bile duct: Secondary | ICD-10-CM | POA: Diagnosis not present

## 2017-06-05 DIAGNOSIS — Z95828 Presence of other vascular implants and grafts: Secondary | ICD-10-CM

## 2017-06-05 DIAGNOSIS — C50919 Malignant neoplasm of unspecified site of unspecified female breast: Secondary | ICD-10-CM | POA: Diagnosis not present

## 2017-06-05 DIAGNOSIS — Z5112 Encounter for antineoplastic immunotherapy: Secondary | ICD-10-CM | POA: Diagnosis not present

## 2017-06-05 LAB — CBC WITH DIFFERENTIAL/PLATELET
BASO%: 0.6 % (ref 0.0–2.0)
Basophils Absolute: 0 10*3/uL (ref 0.0–0.1)
EOS%: 2.5 % (ref 0.0–7.0)
Eosinophils Absolute: 0.1 10*3/uL (ref 0.0–0.5)
HCT: 35.4 % (ref 34.8–46.6)
HEMOGLOBIN: 11.8 g/dL (ref 11.6–15.9)
LYMPH#: 1.2 10*3/uL (ref 0.9–3.3)
LYMPH%: 25.9 % (ref 14.0–49.7)
MCH: 31.5 pg (ref 25.1–34.0)
MCHC: 33.3 g/dL (ref 31.5–36.0)
MCV: 94.4 fL (ref 79.5–101.0)
MONO#: 0.4 10*3/uL (ref 0.1–0.9)
MONO%: 8.6 % (ref 0.0–14.0)
NEUT#: 3 10*3/uL (ref 1.5–6.5)
NEUT%: 62.4 % (ref 38.4–76.8)
Platelets: 173 10*3/uL (ref 145–400)
RBC: 3.75 10*6/uL (ref 3.70–5.45)
RDW: 13.4 % (ref 11.2–14.5)
WBC: 4.8 10*3/uL (ref 3.9–10.3)
nRBC: 0 % (ref 0–0)

## 2017-06-05 LAB — COMPREHENSIVE METABOLIC PANEL
ALBUMIN: 3.6 g/dL (ref 3.5–5.0)
ALK PHOS: 62 U/L (ref 40–150)
ALT: 10 U/L (ref 0–55)
AST: 21 U/L (ref 5–34)
Anion Gap: 8 mEq/L (ref 3–11)
BUN: 18.6 mg/dL (ref 7.0–26.0)
CO2: 26 mEq/L (ref 22–29)
Calcium: 8.8 mg/dL (ref 8.4–10.4)
Chloride: 104 mEq/L (ref 98–109)
Creatinine: 0.7 mg/dL (ref 0.6–1.1)
GLUCOSE: 98 mg/dL (ref 70–140)
Potassium: 3.7 mEq/L (ref 3.5–5.1)
SODIUM: 139 meq/L (ref 136–145)
Total Bilirubin: 0.56 mg/dL (ref 0.20–1.20)
Total Protein: 6.7 g/dL (ref 6.4–8.3)

## 2017-06-05 MED ORDER — DIPHENHYDRAMINE HCL 25 MG PO CAPS
ORAL_CAPSULE | ORAL | Status: AC
  Filled 2017-06-05: qty 2

## 2017-06-05 MED ORDER — SODIUM CHLORIDE 0.9 % IV SOLN
450.0000 mg | Freq: Once | INTRAVENOUS | Status: AC
  Administered 2017-06-05: 450 mg via INTRAVENOUS
  Filled 2017-06-05: qty 21.43

## 2017-06-05 MED ORDER — ACETAMINOPHEN 325 MG PO TABS
650.0000 mg | ORAL_TABLET | Freq: Once | ORAL | Status: AC
  Administered 2017-06-05: 650 mg via ORAL

## 2017-06-05 MED ORDER — SODIUM CHLORIDE 0.9% FLUSH
10.0000 mL | INTRAVENOUS | Status: DC | PRN
  Administered 2017-06-05: 10 mL via INTRAVENOUS
  Filled 2017-06-05: qty 10

## 2017-06-05 MED ORDER — HEPARIN SOD (PORK) LOCK FLUSH 100 UNIT/ML IV SOLN
500.0000 [IU] | Freq: Once | INTRAVENOUS | Status: AC | PRN
  Administered 2017-06-05: 500 [IU]
  Filled 2017-06-05: qty 5

## 2017-06-05 MED ORDER — ACETAMINOPHEN 325 MG PO TABS
ORAL_TABLET | ORAL | Status: AC
  Filled 2017-06-05: qty 2

## 2017-06-05 MED ORDER — SODIUM CHLORIDE 0.9% FLUSH
10.0000 mL | INTRAVENOUS | Status: DC | PRN
  Administered 2017-06-05: 10 mL
  Filled 2017-06-05: qty 10

## 2017-06-05 MED ORDER — DIPHENHYDRAMINE HCL 25 MG PO CAPS
50.0000 mg | ORAL_CAPSULE | Freq: Once | ORAL | Status: AC
  Administered 2017-06-05: 50 mg via ORAL

## 2017-06-05 MED ORDER — SODIUM CHLORIDE 0.9 % IV SOLN
Freq: Once | INTRAVENOUS | Status: AC
  Administered 2017-06-05: 11:00:00 via INTRAVENOUS

## 2017-06-05 NOTE — Progress Notes (Signed)
Patient Care Team: [name], MD as PCP - General (Internal Medicine)  DIAGNOSIS:  Encounter Diagnosis  Name Primary?  . Metastatic breast cancer ([place]) Yes    SUMMARY OF ONCOLOGIC HISTORY:   Metastatic breast cancer ([place])   06/05/2006 Initial Diagnosis    Left breast biopsy: DCIS with apocrine features ER 30%, PR 13%      06/19/2006 Surgery    Left lumpectomy: stage I, pT1a, pN0(i)(sn), pMX, 0.3 cm IDC, grade 1, with extensive high-grade DCIS , margins negative ER 38 %, PR 93 %, Ki-67 10%, HER-2/neu 2+, no additional tissue available for FISH testing, with 0/1 left axillary lymph nodes.      07/30/2006 - 09/17/2006 Radiation Therapy    Adjuvant radiation therapy      09/24/2006 - 09/25/2011 Anti-estrogen oral therapy    Antiestrogen therapy with Arimidex 5 years      04/26/2016 Relapse/Recurrence    Innumerable hepatic lesions seen in both lobes most are 1-2 cm size somewhat confluent largest in the posterior right hepatic dome 3 x 4.6 cm      05/07/2016 PET scan    PET/CT scan: Extensive metastatic involvement of both lobes of the liver, primary could be liver, phalangeal or metastatic disease, hypermetabolic thoracic LN left axillary, left supraclavicular and right CP angle LN, hypermetabolic upper abdominal LN      05/16/2016 Initial Biopsy    Liver biopsy: Metastatic carcinoma breast primary strong positivity for CK 7 weak focal positivity for ER, GCDFP, negative for PR, CDX 2, TTF-1, WT 1; HER-2 positive      06/13/2016 -  Chemotherapy    Taxotere Herceptin and Perjeta every 3 weeks (Taxotere was held for elevated LFTs for cycles 1 and 2)        CHIEF COMPLIANT: Follow-up on Herceptin treatment  INTERVAL HISTORY: Monique Figueroa is a 83 year old with above-mentioned history of metastatic breast cancer currently on Herceptin maintenance therapy.  She is complaining of left arm swelling.  Ultrasound of the arm did not show any blood clots.  Is most likely  lymphedema.  Denies any other problems or concerns.  REVIEW OF SYSTEMS:   Constitutional: Denies fevers, chills or abnormal weight loss Eyes: Denies blurriness of vision Ears, nose, mouth, throat, and face: Denies mucositis or sore throat Respiratory: Denies cough, dyspnea or wheezes Cardiovascular: Denies palpitation, chest discomfort Gastrointestinal:  Denies nausea, heartburn or change in bowel habits Skin: Denies abnormal skin rashes Lymphatics: Denies new lymphadenopathy or easy bruising Neurological:Denies numbness, tingling or new weaknesses Behavioral/Psych: Mood is stable, no new changes  Extremities: Left arm lymphedema Breast:  denies any pain or lumps or nodules in either breasts All other systems were reviewed with the patient and are negative.  I have reviewed the past medical history, past surgical history, social history and family history with the patient and they are unchanged from previous note.  ALLERGIES:  is allergic to perjeta [pertuzumab] and sulfa antibiotics.  MEDICATIONS:  Current Outpatient Prescriptions  Medication Sig Dispense Refill  . hydrochlorothiazide (HYDRODIURIL) 25 MG tablet Take 25 mg by mouth daily.     Marland Kitchen HYDROcodone-acetaminophen (NORCO/VICODIN) 5-325 MG tablet Take 1-2 tablets by mouth every 4 (four) hours as needed for moderate pain or severe pain. 40 tablet 0  . levothyroxine (SYNTHROID, LEVOTHROID) 125 MCG tablet Take 125 mcg by mouth daily before breakfast.     . loratadine (CLARITIN) 10 MG tablet Take 10 mg by mouth daily as needed for allergies.    . metFORMIN (GLUCOPHAGE)  500 MG tablet Take 500 mg by mouth daily with breakfast.     . Multiple Vitamins-Minerals (MULTIVITAMIN WITH MINERALS) tablet Take 1 tablet by mouth daily.    Marland Kitchen omeprazole (PRILOSEC OTC) 20 MG tablet Take 20 mg by mouth daily.    . ranitidine (ZANTAC) 150 MG tablet Take 150 mg by mouth at bedtime.    . vitamin B-12 (CYANOCOBALAMIN) 500 MCG tablet Take 500 mcg by mouth  daily as needed.    . vitamin C (ASCORBIC ACID) 500 MG tablet Take 500 mg by mouth daily.     No current facility-administered medications for this visit.     PHYSICAL EXAMINATION: ECOG PERFORMANCE STATUS: 1 - Symptomatic but completely ambulatory  Vitals:   06/05/17 1007  BP: (!) 162/74  Pulse: 72  Resp: 17  Temp: 98 F (36.7 C)  SpO2: 98%   Filed Weights   06/05/17 1007  Weight: 177 lb 11.2 oz (80.6 kg)    GENERAL:alert, no distress and comfortable SKIN: skin color, texture, turgor are normal, no rashes or significant lesions EYES: normal, Conjunctiva are pink and non-injected, sclera clear OROPHARYNX:no exudate, no erythema and lips, buccal mucosa, and tongue normal  NECK: supple, thyroid normal size, non-tender, without nodularity LYMPH:  no palpable lymphadenopathy in the cervical, axillary or inguinal LUNGS: clear to auscultation and percussion with normal breathing effort HEART: regular rate & rhythm and no murmurs and no lower extremity edema ABDOMEN:abdomen soft, non-tender and normal bowel sounds MUSCULOSKELETAL:no cyanosis of digits and no clubbing  NEURO: alert & oriented x 3 with fluent speech, no focal motor/sensory deficits EXTREMITIES: Left arm swelling due to lymphedema  LABORATORY DATA:  I have reviewed the data as listed   Chemistry      Component Value Date/Time   NA 139 06/05/2017 0900   K 3.7 06/05/2017 0900   CL 105 05/16/2016 1254   CL 104 06/09/2012 1402   CO2 26 06/05/2017 0900   BUN 18.6 06/05/2017 0900   CREATININE 0.7 06/05/2017 0900      Component Value Date/Time   CALCIUM 8.8 06/05/2017 0900   ALKPHOS 62 06/05/2017 0900   AST 21 06/05/2017 0900   ALT 10 06/05/2017 0900   BILITOT 0.56 06/05/2017 0900       Lab Results  Component Value Date   WBC 4.8 06/05/2017   HGB 11.8 06/05/2017   HCT 35.4 06/05/2017   MCV 94.4 06/05/2017   PLT 173 06/05/2017   NEUTROABS 3.0 06/05/2017    ASSESSMENT & PLAN:  Metastatic breast  cancer ([place]) Metastatic breast cancer with innumerable liver metastases detected on ultrasound and recent MRI of the liver on 04/26/2016 (Prior history of left breast IDC T1 1 N0 stage IA 0.3 cm grade 1 tumor that was ER 38%, PR 93%, Ki-67 10%, HER-2 2+, no tissue for FISH testing, 0/1 lymph node negative, status post lumpectomy radiation and 5 years of Arimidex completed in February 2013)  PET/CT scan: 05/07/2016: Extensive metastatic involvement of both lobes of the liver, primary could be liver, phalangeal or metastatic disease, hypermetabolic thoracic lymph nodes left axillary, left supraclavicular and right CP angle lymph nodes, hypermetabolic upper abdominal lymph nodes  Liver biopsy10/07/2016: Metastatic carcinoma breast primary strong positivity for CK 7 weak focal positivity for ER, GCDFP, negative for PR, CDX 2, TTF-1, WT 1; HER-2 positive  Treatment plan: 1. Taxotere Herceptin and Perjeta every 3 weeks palliative chemotherapy started 06/13/16 (Perjeta discontinued after 5 cycles due to allergy) 2. followed by Herceptin maintenance  -----------------------------------------------------------------------------------------------------------------------------------------  Current Treatment:Herceptin maintenance Peripheral neuropathy: Not bothering her, Blindness that was transient: on81 mg aspirin daily.  Goals of treatment: Palliation of symptoms and prolongation of life Continue Herceptin maintenance indefinitely as long as she is responding. CT chest abdomen pelvis 04/02/2017: Liver metastases is improving largest lesion decreased from 10.1 cm to 6.6 cm  Patient stays very busy doing chores at home, weeding, painting etc. Return to clinic in 9 weeksfor follow-up every 3 weeks for Herceptin. Scans will be obtained in 3 months  I spent 25 minutes talking to the patient of   I spent 25 minutes talking to the patient of which more than half was spent in counseling and  coordination of care.  No orders of the defined types were placed in this encounter.  The patient has a good understanding of the overall plan. she agrees with it. she will call with any problems that may develop before the next visit here.   Rulon Eisenmenger, MD 06/05/17

## 2017-06-05 NOTE — Telephone Encounter (Signed)
Gave patient avs and calendar with appts per 11.1 los °

## 2017-06-05 NOTE — Patient Instructions (Signed)

## 2017-06-05 NOTE — Patient Instructions (Signed)
[facility] Discharge Instructions for Patients Receiving Chemotherapy  Today you received the following chemotherapy agents Herceptin  To help prevent nausea and vomiting after your treatment, we encourage you to take your nausea medication as directed   If you develop nausea and vomiting that is not controlled by your nausea medication, call the clinic.   BELOW ARE SYMPTOMS THAT SHOULD BE REPORTED IMMEDIATELY:  *FEVER GREATER THAN 100.5 F  *CHILLS WITH OR WITHOUT FEVER  NAUSEA AND VOMITING THAT IS NOT CONTROLLED WITH YOUR NAUSEA MEDICATION  *UNUSUAL SHORTNESS OF BREATH  *UNUSUAL BRUISING OR BLEEDING  TENDERNESS IN MOUTH AND THROAT WITH OR WITHOUT PRESENCE OF ULCERS  *URINARY PROBLEMS  *BOWEL PROBLEMS  UNUSUAL RASH Items with * indicate a potential emergency and should be followed up as soon as possible.  Feel free to call the clinic should you have any questions or concerns. The clinic phone number is [phone].  Please show the CHEMO ALERT CARD at check-in to the Emergency Department and triage nurse.   

## 2017-06-25 ENCOUNTER — Ambulatory Visit: Payer: Medicare Other | Admitting: Hematology and Oncology

## 2017-06-25 ENCOUNTER — Other Ambulatory Visit: Payer: Medicare Other

## 2017-06-27 ENCOUNTER — Ambulatory Visit (HOSPITAL_BASED_OUTPATIENT_CLINIC_OR_DEPARTMENT_OTHER): Payer: Medicare Other

## 2017-06-27 VITALS — BP 110/88 | HR 84 | Temp 97.7°F | Resp 16 | Wt 177.0 lb

## 2017-06-27 DIAGNOSIS — Z5112 Encounter for antineoplastic immunotherapy: Secondary | ICD-10-CM

## 2017-06-27 DIAGNOSIS — C787 Secondary malignant neoplasm of liver and intrahepatic bile duct: Secondary | ICD-10-CM

## 2017-06-27 DIAGNOSIS — C50919 Malignant neoplasm of unspecified site of unspecified female breast: Secondary | ICD-10-CM

## 2017-06-27 MED ORDER — SODIUM CHLORIDE 0.9% FLUSH
10.0000 mL | INTRAVENOUS | Status: DC | PRN
  Administered 2017-06-27: 10 mL
  Filled 2017-06-27: qty 10

## 2017-06-27 MED ORDER — ACETAMINOPHEN 325 MG PO TABS
ORAL_TABLET | ORAL | Status: AC
  Filled 2017-06-27: qty 2

## 2017-06-27 MED ORDER — DIPHENHYDRAMINE HCL 25 MG PO CAPS
ORAL_CAPSULE | ORAL | Status: AC
  Filled 2017-06-27: qty 1

## 2017-06-27 MED ORDER — TRASTUZUMAB CHEMO 150 MG IV SOLR
450.0000 mg | Freq: Once | INTRAVENOUS | Status: AC
  Administered 2017-06-27: 450 mg via INTRAVENOUS
  Filled 2017-06-27: qty 21.43

## 2017-06-27 MED ORDER — SODIUM CHLORIDE 0.9 % IV SOLN
Freq: Once | INTRAVENOUS | Status: AC
  Administered 2017-06-27: 09:00:00 via INTRAVENOUS

## 2017-06-27 MED ORDER — DIPHENHYDRAMINE HCL 25 MG PO CAPS
50.0000 mg | ORAL_CAPSULE | Freq: Once | ORAL | Status: AC
  Administered 2017-06-27: 50 mg via ORAL

## 2017-06-27 MED ORDER — ACETAMINOPHEN 325 MG PO TABS
650.0000 mg | ORAL_TABLET | Freq: Once | ORAL | Status: AC
  Administered 2017-06-27: 650 mg via ORAL

## 2017-06-27 MED ORDER — HEPARIN SOD (PORK) LOCK FLUSH 100 UNIT/ML IV SOLN
500.0000 [IU] | Freq: Once | INTRAVENOUS | Status: AC | PRN
  Administered 2017-06-27: 500 [IU]
  Filled 2017-06-27: qty 5

## 2017-06-27 NOTE — Patient Instructions (Signed)
[facility] Discharge Instructions for Patients Receiving Chemotherapy  Today you received the following chemotherapy agents herceptin   To help prevent nausea and vomiting after your treatment, we encourage you to take your nausea medication as directed   If you develop nausea and vomiting that is not controlled by your nausea medication, call the clinic.   BELOW ARE SYMPTOMS THAT SHOULD BE REPORTED IMMEDIATELY:  *FEVER GREATER THAN 100.5 F  *CHILLS WITH OR WITHOUT FEVER  NAUSEA AND VOMITING THAT IS NOT CONTROLLED WITH YOUR NAUSEA MEDICATION  *UNUSUAL SHORTNESS OF BREATH  *UNUSUAL BRUISING OR BLEEDING  TENDERNESS IN MOUTH AND THROAT WITH OR WITHOUT PRESENCE OF ULCERS  *URINARY PROBLEMS  *BOWEL PROBLEMS  UNUSUAL RASH Items with * indicate a potential emergency and should be followed up as soon as possible.  Feel free to call the clinic you have any questions or concerns. The clinic phone number is [phone].  

## 2017-07-17 ENCOUNTER — Other Ambulatory Visit: Payer: Medicare Other

## 2017-07-17 ENCOUNTER — Ambulatory Visit (HOSPITAL_BASED_OUTPATIENT_CLINIC_OR_DEPARTMENT_OTHER): Payer: Medicare Other

## 2017-07-17 VITALS — BP 145/62 | HR 79 | Temp 98.1°F | Resp 18

## 2017-07-17 DIAGNOSIS — Z5112 Encounter for antineoplastic immunotherapy: Secondary | ICD-10-CM

## 2017-07-17 DIAGNOSIS — C50919 Malignant neoplasm of unspecified site of unspecified female breast: Secondary | ICD-10-CM

## 2017-07-17 DIAGNOSIS — C787 Secondary malignant neoplasm of liver and intrahepatic bile duct: Secondary | ICD-10-CM | POA: Diagnosis not present

## 2017-07-17 MED ORDER — HEPARIN SOD (PORK) LOCK FLUSH 100 UNIT/ML IV SOLN
500.0000 [IU] | Freq: Once | INTRAVENOUS | Status: AC | PRN
Start: 2017-07-17 — End: 2017-07-17
  Administered 2017-07-17: 500 [IU]
  Filled 2017-07-17: qty 5

## 2017-07-17 MED ORDER — TRASTUZUMAB CHEMO 150 MG IV SOLR
450.0000 mg | Freq: Once | INTRAVENOUS | Status: AC
  Administered 2017-07-17: 450 mg via INTRAVENOUS
  Filled 2017-07-17: qty 21.43

## 2017-07-17 MED ORDER — ACETAMINOPHEN 325 MG PO TABS
ORAL_TABLET | ORAL | Status: AC
  Filled 2017-07-17: qty 2

## 2017-07-17 MED ORDER — SODIUM CHLORIDE 0.9% FLUSH
10.0000 mL | INTRAVENOUS | Status: DC | PRN
  Administered 2017-07-17: 10 mL
  Filled 2017-07-17: qty 10

## 2017-07-17 MED ORDER — ACETAMINOPHEN 325 MG PO TABS
650.0000 mg | ORAL_TABLET | Freq: Once | ORAL | Status: AC
  Administered 2017-07-17: 650 mg via ORAL

## 2017-07-17 MED ORDER — SODIUM CHLORIDE 0.9 % IV SOLN
Freq: Once | INTRAVENOUS | Status: AC
  Administered 2017-07-17: 09:00:00 via INTRAVENOUS

## 2017-07-17 MED ORDER — DIPHENHYDRAMINE HCL 25 MG PO CAPS
ORAL_CAPSULE | ORAL | Status: AC
  Filled 2017-07-17: qty 2

## 2017-07-17 MED ORDER — DIPHENHYDRAMINE HCL 25 MG PO CAPS
50.0000 mg | ORAL_CAPSULE | Freq: Once | ORAL | Status: AC
  Administered 2017-07-17: 50 mg via ORAL

## 2017-07-17 NOTE — Patient Instructions (Signed)
[facility] Discharge Instructions for Patients Receiving Chemotherapy  Today you received the following chemotherapy agents Herceptin  To help prevent nausea and vomiting after your treatment, we encourage you to take your nausea medication as directed   If you develop nausea and vomiting that is not controlled by your nausea medication, call the clinic.   BELOW ARE SYMPTOMS THAT SHOULD BE REPORTED IMMEDIATELY:  *FEVER GREATER THAN 100.5 F  *CHILLS WITH OR WITHOUT FEVER  NAUSEA AND VOMITING THAT IS NOT CONTROLLED WITH YOUR NAUSEA MEDICATION  *UNUSUAL SHORTNESS OF BREATH  *UNUSUAL BRUISING OR BLEEDING  TENDERNESS IN MOUTH AND THROAT WITH OR WITHOUT PRESENCE OF ULCERS  *URINARY PROBLEMS  *BOWEL PROBLEMS  UNUSUAL RASH Items with * indicate a potential emergency and should be followed up as soon as possible.  Feel free to call the clinic should you have any questions or concerns. The clinic phone number is [phone].  Please show the CHEMO ALERT CARD at check-in to the Emergency Department and triage nurse.   

## 2017-08-06 NOTE — Assessment & Plan Note (Signed)
Metastatic breast cancer with innumerable liver metastases detected on ultrasound and recent MRI of the liver on 04/26/2016 (Prior history of left breast IDC T1 1 N0 stage IA 0.3 cm grade 1 tumor that was ER 38%, PR 93%, Ki-67 10%, HER-2 2+, no tissue for FISH testing, 0/1 lymph node negative, status post lumpectomy radiation and 5 years of Arimidex completed in February 2013)  PET/CT scan: 05/07/2016: Extensive metastatic involvement of both lobes of the liver, primary could be liver, phalangeal or metastatic disease, hypermetabolic thoracic lymph nodes left axillary, left supraclavicular and right CP angle lymph nodes, hypermetabolic upper abdominal lymph nodes  Liver biopsy10/07/2016: Metastatic carcinoma breast primary strong positivity for CK 7 weak focal positivity for ER, GCDFP, negative for PR, CDX 2, TTF-1, WT 1; HER-2 positive  Treatment plan: 1. Taxotere Herceptin and Perjeta every 3 weeks palliative chemotherapy started 06/13/16 (Perjeta discontinued after 5 cycles due to allergy) 2. followed by Herceptin maintenance  ----------------------------------------------------------------------------------------------------------------------------------------- Current Treatment:Herceptin maintenance Peripheral neuropathy: Not bothering her, Blindness that was transient: on81 mg aspirin daily.  Goals of treatment: Palliation of symptoms and prolongation of life Continue Herceptin maintenance indefinitely as long as she is responding. CT chest abdomen pelvis 04/02/2017: Liver metastases is improving largest lesion decreased from 10.1 cm to 6.6 cm  Patient stays very busy doing chores at home, weeding, painting etc. Return to clinic in 9weeksfor follow-up every 3 weeks for Herceptin. Scans will be obtained in

## 2017-08-07 ENCOUNTER — Other Ambulatory Visit (HOSPITAL_BASED_OUTPATIENT_CLINIC_OR_DEPARTMENT_OTHER): Payer: Medicare Other

## 2017-08-07 ENCOUNTER — Ambulatory Visit (HOSPITAL_BASED_OUTPATIENT_CLINIC_OR_DEPARTMENT_OTHER): Payer: Medicare Other

## 2017-08-07 ENCOUNTER — Inpatient Hospital Stay: Payer: Medicare Other | Attending: Hematology and Oncology | Admitting: Hematology and Oncology

## 2017-08-07 ENCOUNTER — Telehealth: Payer: Self-pay | Admitting: Hematology and Oncology

## 2017-08-07 ENCOUNTER — Ambulatory Visit: Payer: Medicare Other

## 2017-08-07 VITALS — BP 171/83 | HR 77

## 2017-08-07 DIAGNOSIS — C787 Secondary malignant neoplasm of liver and intrahepatic bile duct: Secondary | ICD-10-CM

## 2017-08-07 DIAGNOSIS — C50919 Malignant neoplasm of unspecified site of unspecified female breast: Secondary | ICD-10-CM

## 2017-08-07 DIAGNOSIS — Z95828 Presence of other vascular implants and grafts: Secondary | ICD-10-CM

## 2017-08-07 DIAGNOSIS — Z5112 Encounter for antineoplastic immunotherapy: Secondary | ICD-10-CM | POA: Diagnosis not present

## 2017-08-07 LAB — CBC WITH DIFFERENTIAL/PLATELET
BASO%: 0.8 % (ref 0.0–2.0)
Basophils Absolute: 0 10*3/uL (ref 0.0–0.1)
EOS%: 2.5 % (ref 0.0–7.0)
Eosinophils Absolute: 0.1 10*3/uL (ref 0.0–0.5)
HCT: 37.3 % (ref 34.8–46.6)
HGB: 12.5 g/dL (ref 11.6–15.9)
LYMPH%: 23.7 % (ref 14.0–49.7)
MCH: 31.4 pg (ref 25.1–34.0)
MCHC: 33.5 g/dL (ref 31.5–36.0)
MCV: 93.6 fL (ref 79.5–101.0)
MONO#: 0.4 10*3/uL (ref 0.1–0.9)
MONO%: 9.2 % (ref 0.0–14.0)
NEUT#: 3.1 10*3/uL (ref 1.5–6.5)
NEUT%: 63.8 % (ref 38.4–76.8)
PLATELETS: 195 10*3/uL (ref 145–400)
RBC: 3.99 10*6/uL (ref 3.70–5.45)
RDW: 13.2 % (ref 11.2–14.5)
WBC: 4.8 10*3/uL (ref 3.9–10.3)
lymph#: 1.1 10*3/uL (ref 0.9–3.3)

## 2017-08-07 LAB — COMPREHENSIVE METABOLIC PANEL
ALK PHOS: 68 U/L (ref 40–150)
ALT: 14 U/L (ref 0–55)
AST: 22 U/L (ref 5–34)
Albumin: 3.9 g/dL (ref 3.5–5.0)
Anion Gap: 10 mEq/L (ref 3–11)
BILIRUBIN TOTAL: 0.56 mg/dL (ref 0.20–1.20)
BUN: 14.7 mg/dL (ref 7.0–26.0)
CO2: 27 mEq/L (ref 22–29)
Calcium: 9.3 mg/dL (ref 8.4–10.4)
Chloride: 102 mEq/L (ref 98–109)
Creatinine: 0.8 mg/dL (ref 0.6–1.1)
EGFR: >60 mL/min/{1.73_m2} (ref >60)
GLUCOSE: 102 mg/dL (ref 70–140)
Potassium: 3.7 mEq/L (ref 3.5–5.1)
SODIUM: 139 meq/L (ref 136–145)
TOTAL PROTEIN: 7.3 g/dL (ref 6.4–8.3)

## 2017-08-07 MED ORDER — TRASTUZUMAB CHEMO 150 MG IV SOLR
450.0000 mg | Freq: Once | INTRAVENOUS | Status: AC
  Administered 2017-08-07: 450 mg via INTRAVENOUS
  Filled 2017-08-07: qty 21.43

## 2017-08-07 MED ORDER — ACETAMINOPHEN 325 MG PO TABS
ORAL_TABLET | ORAL | Status: AC
  Filled 2017-08-07: qty 2

## 2017-08-07 MED ORDER — ACETAMINOPHEN 325 MG PO TABS
650.0000 mg | ORAL_TABLET | Freq: Once | ORAL | Status: AC
  Administered 2017-08-07: 650 mg via ORAL

## 2017-08-07 MED ORDER — DIPHENHYDRAMINE HCL 25 MG PO CAPS
50.0000 mg | ORAL_CAPSULE | Freq: Once | ORAL | Status: AC
  Administered 2017-08-07: 50 mg via ORAL

## 2017-08-07 MED ORDER — SODIUM CHLORIDE 0.9 % IV SOLN
Freq: Once | INTRAVENOUS | Status: AC
  Administered 2017-08-07: 11:00:00 via INTRAVENOUS

## 2017-08-07 MED ORDER — DIPHENHYDRAMINE HCL 25 MG PO CAPS
ORAL_CAPSULE | ORAL | Status: AC
  Filled 2017-08-07: qty 2

## 2017-08-07 MED ORDER — SODIUM CHLORIDE 0.9% FLUSH
10.0000 mL | INTRAVENOUS | Status: DC | PRN
  Administered 2017-08-07: 10 mL via INTRAVENOUS
  Filled 2017-08-07: qty 10

## 2017-08-07 MED ORDER — HEPARIN SOD (PORK) LOCK FLUSH 100 UNIT/ML IV SOLN
500.0000 [IU] | Freq: Once | INTRAVENOUS | Status: AC | PRN
  Administered 2017-08-07: 500 [IU]
  Filled 2017-08-07: qty 5

## 2017-08-07 MED ORDER — SODIUM CHLORIDE 0.9% FLUSH
10.0000 mL | INTRAVENOUS | Status: DC | PRN
  Administered 2017-08-07: 10 mL
  Filled 2017-08-07: qty 10

## 2017-08-07 NOTE — Patient Instructions (Signed)
[facility] Discharge Instructions for Patients Receiving Chemotherapy  Today you received the following chemotherapy agent: Herceptin  To help prevent nausea and vomiting after your treatment, we encourage you to take your nausea medication as directed.   If you develop nausea and vomiting that is not controlled by your nausea medication, call the clinic.   BELOW ARE SYMPTOMS THAT SHOULD BE REPORTED IMMEDIATELY:  *FEVER GREATER THAN 100.5 F  *CHILLS WITH OR WITHOUT FEVER  NAUSEA AND VOMITING THAT IS NOT CONTROLLED WITH YOUR NAUSEA MEDICATION  *UNUSUAL SHORTNESS OF BREATH  *UNUSUAL BRUISING OR BLEEDING  TENDERNESS IN MOUTH AND THROAT WITH OR WITHOUT PRESENCE OF ULCERS  *URINARY PROBLEMS  *BOWEL PROBLEMS  UNUSUAL RASH Items with * indicate a potential emergency and should be followed up as soon as possible.  Feel free to call the clinic should you have any questions or concerns. The clinic phone number is [phone].  Please show the CHEMO ALERT CARD at check-in to the Emergency Department and triage nurse.   

## 2017-08-07 NOTE — Progress Notes (Signed)
Per MD [place] desk RN Tiffany ok to treat with elevated BP

## 2017-08-07 NOTE — Telephone Encounter (Signed)
Gave patient avs and calendar with appts per 1/3 los

## 2017-08-07 NOTE — Progress Notes (Signed)
Patient Care Team: [name], MD as PCP - General (Internal Medicine)  DIAGNOSIS:  Encounter Diagnosis  Name Primary?  . Metastatic breast cancer ([place])     SUMMARY OF ONCOLOGIC HISTORY:   Metastatic breast cancer ([place])   06/05/2006 Initial Diagnosis    Left breast biopsy: DCIS with apocrine features ER 30%, PR 13%      06/19/2006 Surgery    Left lumpectomy: stage I, pT1a, pN0(i)(sn), pMX, 0.3 cm IDC, grade 1, with extensive high-grade DCIS , margins negative ER 38 %, PR 93 %, Ki-67 10%, HER-2/neu 2+, no additional tissue available for FISH testing, with 0/1 left axillary lymph nodes.      07/30/2006 - 09/17/2006 Radiation Therapy    Adjuvant radiation therapy      09/24/2006 - 09/25/2011 Anti-estrogen oral therapy    Antiestrogen therapy with Arimidex 5 years      04/26/2016 Relapse/Recurrence    Innumerable hepatic lesions seen in both lobes most are 1-2 cm size somewhat confluent largest in the posterior right hepatic dome 3 x 4.6 cm      05/07/2016 PET scan    PET/CT scan: Extensive metastatic involvement of both lobes of the liver, primary could be liver, phalangeal or metastatic disease, hypermetabolic thoracic LN left axillary, left supraclavicular and right CP angle LN, hypermetabolic upper abdominal LN      05/16/2016 Initial Biopsy    Liver biopsy: Metastatic carcinoma breast primary strong positivity for CK 7 weak focal positivity for ER, GCDFP, negative for PR, CDX 2, TTF-1, WT 1; HER-2 positive      06/13/2016 - 11/07/2016 Chemotherapy    Taxotere Herceptin and Perjeta every 3 weeks (Taxotere was held for elevated LFTs for cycles 1 and 2)       11/28/2016 -  Chemotherapy    Herceptin maintenance therapy for metastatic breast cancer every 3 weeks        CHIEF COMPLIANT: Herceptin maintenance therapy  INTERVAL HISTORY: Monique Figueroa is a 84 year old with above-mentioned history of metastatic breast cancer who is currently on Herceptin maintenance.  She  has not had any problems with Herceptin treatment.  She also does not report any new symptoms or concerns.  Denies any fevers chills night sweats.  Denies any abdominal pain nausea vomiting.  REVIEW OF SYSTEMS:   Constitutional: Denies fevers, chills or abnormal weight loss Eyes: Denies blurriness of vision Ears, nose, mouth, throat, and face: Denies mucositis or sore throat Respiratory: Denies cough, dyspnea or wheezes Cardiovascular: Denies palpitation, chest discomfort Gastrointestinal:  Denies nausea, heartburn or change in bowel habits Skin: Denies abnormal skin rashes Lymphatics: Denies new lymphadenopathy or easy bruising Neurological:Denies numbness, tingling or new weaknesses Behavioral/Psych: Mood is stable, no new changes  Extremities: No lower extremity edema Breast:  denies any pain or lumps or nodules in either breasts All other systems were reviewed with the patient and are negative.  I have reviewed the past medical history, past surgical history, social history and family history with the patient and they are unchanged from previous note.  ALLERGIES:  is allergic to perjeta [pertuzumab] and sulfa antibiotics.  MEDICATIONS:  Current Outpatient Medications  Medication Sig Dispense Refill  . hydrochlorothiazide (HYDRODIURIL) 25 MG tablet Take 25 mg by mouth daily.     Marland Kitchen HYDROcodone-acetaminophen (NORCO/VICODIN) 5-325 MG tablet Take 1-2 tablets by mouth every 4 (four) hours as needed for moderate pain or severe pain. 40 tablet 0  . levothyroxine (SYNTHROID, LEVOTHROID) 125 MCG tablet Take 125 mcg by mouth daily  before breakfast.     . loratadine (CLARITIN) 10 MG tablet Take 10 mg by mouth daily as needed for allergies.    . metFORMIN (GLUCOPHAGE) 500 MG tablet Take 500 mg by mouth daily with breakfast.     . Multiple Vitamins-Minerals (MULTIVITAMIN WITH MINERALS) tablet Take 1 tablet by mouth daily.    Marland Kitchen omeprazole (PRILOSEC OTC) 20 MG tablet Take 20 mg by mouth daily.      . ranitidine (ZANTAC) 150 MG tablet Take 150 mg by mouth at bedtime.    . vitamin B-12 (CYANOCOBALAMIN) 500 MCG tablet Take 500 mcg by mouth daily as needed.    . vitamin C (ASCORBIC ACID) 500 MG tablet Take 500 mg by mouth daily.     No current facility-administered medications for this visit.     PHYSICAL EXAMINATION: ECOG PERFORMANCE STATUS: 1 - Symptomatic but completely ambulatory  Vitals:   08/07/17 1014  BP: (!) 167/69  Pulse: 78  Resp: 18  Temp: 98.3 F (36.8 C)  SpO2: 98%   Filed Weights   08/07/17 1014  Weight: 177 lb 11.2 oz (80.6 kg)    GENERAL:alert, no distress and comfortable SKIN: skin color, texture, turgor are normal, no rashes or significant lesions EYES: normal, Conjunctiva are pink and non-injected, sclera clear OROPHARYNX:no exudate, no erythema and lips, buccal mucosa, and tongue normal  NECK: supple, thyroid normal size, non-tender, without nodularity LYMPH:  no palpable lymphadenopathy in the cervical, axillary or inguinal LUNGS: clear to auscultation and percussion with normal breathing effort HEART: regular rate & rhythm and no murmurs and no lower extremity edema ABDOMEN:abdomen soft, non-tender and normal bowel sounds MUSCULOSKELETAL:no cyanosis of digits and no clubbing  NEURO: alert & oriented x 3 with fluent speech, no focal motor/sensory deficits EXTREMITIES: No lower extremity edema  LABORATORY DATA:  I have reviewed the data as listed   Chemistry      Component Value Date/Time   NA 139 06/05/2017 0900   K 3.7 06/05/2017 0900   CL 105 05/16/2016 1254   CL 104 06/09/2012 1402   CO2 26 06/05/2017 0900   BUN 18.6 06/05/2017 0900   CREATININE 0.7 06/05/2017 0900      Component Value Date/Time   CALCIUM 8.8 06/05/2017 0900   ALKPHOS 62 06/05/2017 0900   AST 21 06/05/2017 0900   ALT 10 06/05/2017 0900   BILITOT 0.56 06/05/2017 0900       Lab Results  Component Value Date   WBC 4.8 08/07/2017   HGB 12.5 08/07/2017   HCT 37.3  08/07/2017   MCV 93.6 08/07/2017   PLT 195 08/07/2017   NEUTROABS 3.1 08/07/2017    ASSESSMENT & PLAN:  Metastatic breast cancer ([place]) Metastatic breast cancer with innumerable liver metastases detected on ultrasound and recent MRI of the liver on 04/26/2016 (Prior history of left breast IDC T1 1 N0 stage IA 0.3 cm grade 1 tumor that was ER 38%, PR 93%, Ki-67 10%, HER-2 2+, no tissue for FISH testing, 0/1 lymph node negative, status post lumpectomy radiation and 5 years of Arimidex completed in February 2013)  PET/CT scan: 05/07/2016: Extensive metastatic involvement of both lobes of the liver, primary could be liver, phalangeal or metastatic disease, hypermetabolic thoracic lymph nodes left axillary, left supraclavicular and right CP angle lymph nodes, hypermetabolic upper abdominal lymph nodes  Liver biopsy10/07/2016: Metastatic carcinoma breast primary strong positivity for CK 7 weak focal positivity for ER, GCDFP, negative for PR, CDX 2, TTF-1, WT 1; HER-2 positive  Treatment  plan: 1. Taxotere Herceptin and Perjeta every 3 weeks palliative chemotherapy started 06/13/16 (Perjeta discontinued after 5 cycles due to allergy) 2. followed by Herceptin maintenance  ----------------------------------------------------------------------------------------------------------------------------------------- Current Treatment:Herceptin maintenance Peripheral neuropathy: Not bothering her, Blindness that was transient: on81 mg aspirin daily.  Goals of treatment: Palliation of symptoms and prolongation of life Continue Herceptin maintenance indefinitely as long as she is responding. CT chest abdomen pelvis 04/02/2017: Liver metastases is improving largest lesion decreased from 10.1 cm to 6.6 cm  Patient stays very busy doing chores at home, weeding, painting etc. Return to clinic in 9weeksfor follow-up every 3 weeks for Herceptin. Scans will be obtained in 9 weeks and follow-up after  that.  I spent 25 minutes talking to the patient of which more than half was spent in counseling and coordination of care.  Orders Placed This Encounter  Procedures  . CT Chest W Contrast    Standing Status:   Future    Standing Expiration Date:   08/07/2018    Order Specific Question:   If indicated for the ordered procedure, I authorize the administration of contrast media per Radiology protocol    Answer:   Yes    Order Specific Question:   Preferred imaging location?    Answer:   [facility]    Order Specific Question:   Radiology Contrast Protocol - do NOT remove file path    Answer:   [email or web address]    Order Specific Question:   Reason for Exam additional comments    Answer:   Metastatic breast cancer restaging  . CT Abdomen Pelvis W Contrast    Standing Status:   Future    Standing Expiration Date:   08/07/2018    Order Specific Question:   If indicated for the ordered procedure, I authorize the administration of contrast media per Radiology protocol    Answer:   Yes    Order Specific Question:   Preferred imaging location?    Answer:   [facility]    Order Specific Question:   Radiology Contrast Protocol - do NOT remove file path    Answer:   [email or web address]    Order Specific Question:   Reason for Exam additional comments    Answer:   Metastatic breast cancer restaging  . ECHOCARDIOGRAM COMPLETE    Standing Status:   Future    Standing Expiration Date:   11/06/2018    Order Specific Question:   Where should this test be performed    Answer:   [facility]    Order Specific Question:   Complete or Limited study?    Answer:   Complete    Order Specific Question:   With Image Enhancing Agent or without Image Enhancing Agent?    Answer:   With Image Enhancing Agent    Order Specific Question:   Reason for exam-Echo    Answer:   Chemo  V67.2 / Z09   The patient has a good understanding of the  overall plan. she agrees with it. she will call with any problems that may develop before the next visit here.   Harriette Ohara, MD 08/07/17

## 2017-08-21 ENCOUNTER — Ambulatory Visit (HOSPITAL_COMMUNITY)
Admission: RE | Admit: 2017-08-21 | Discharge: 2017-08-21 | Disposition: A | Discharge status: Home / Self Care | Payer: Medicare Other | Source: Ambulatory Visit | Attending: Hematology and Oncology | Admitting: Hematology and Oncology

## 2017-08-21 DIAGNOSIS — C50919 Malignant neoplasm of unspecified site of unspecified female breast: Secondary | ICD-10-CM | POA: Insufficient documentation

## 2017-08-21 DIAGNOSIS — I083 Combined rheumatic disorders of mitral, aortic and tricuspid valves: Secondary | ICD-10-CM | POA: Insufficient documentation

## 2017-08-21 NOTE — Progress Notes (Signed)
  Echocardiogram 2D Echocardiogram has been performed.  Monique Figueroa T [name] 08/21/2017, 10:46 AM

## 2017-08-28 ENCOUNTER — Inpatient Hospital Stay: Payer: Medicare Other | Attending: Hematology and Oncology

## 2017-08-28 VITALS — BP 147/71 | HR 85 | Temp 98.2°F | Resp 18

## 2017-08-28 DIAGNOSIS — D0512 Intraductal carcinoma in situ of left breast: Secondary | ICD-10-CM | POA: Insufficient documentation

## 2017-08-28 DIAGNOSIS — Z170 Estrogen receptor positive status [ER+]: Secondary | ICD-10-CM | POA: Insufficient documentation

## 2017-08-28 DIAGNOSIS — Z5112 Encounter for antineoplastic immunotherapy: Secondary | ICD-10-CM | POA: Insufficient documentation

## 2017-08-28 DIAGNOSIS — C50919 Malignant neoplasm of unspecified site of unspecified female breast: Secondary | ICD-10-CM

## 2017-08-28 MED ORDER — DIPHENHYDRAMINE HCL 25 MG PO CAPS
50.0000 mg | ORAL_CAPSULE | Freq: Once | ORAL | Status: AC
  Administered 2017-08-28: 50 mg via ORAL

## 2017-08-28 MED ORDER — SODIUM CHLORIDE 0.9 % IV SOLN
Freq: Once | INTRAVENOUS | Status: AC
  Administered 2017-08-28: 09:00:00 via INTRAVENOUS

## 2017-08-28 MED ORDER — DIPHENHYDRAMINE HCL 25 MG PO CAPS
ORAL_CAPSULE | ORAL | Status: AC
Start: 2017-08-28 — End: 2017-08-28
  Filled 2017-08-28: qty 2

## 2017-08-28 MED ORDER — HEPARIN SOD (PORK) LOCK FLUSH 100 UNIT/ML IV SOLN
500.0000 [IU] | Freq: Once | INTRAVENOUS | Status: AC | PRN
  Administered 2017-08-28: 500 [IU]
  Filled 2017-08-28: qty 5

## 2017-08-28 MED ORDER — SODIUM CHLORIDE 0.9% FLUSH
10.0000 mL | INTRAVENOUS | Status: DC | PRN
  Administered 2017-08-28: 10 mL
  Filled 2017-08-28: qty 10

## 2017-08-28 MED ORDER — DIPHENHYDRAMINE HCL 25 MG PO CAPS
ORAL_CAPSULE | ORAL | Status: AC
  Filled 2017-08-28: qty 1

## 2017-08-28 MED ORDER — ACETAMINOPHEN 325 MG PO TABS
650.0000 mg | ORAL_TABLET | Freq: Once | ORAL | Status: AC
  Administered 2017-08-28: 650 mg via ORAL

## 2017-08-28 MED ORDER — ACETAMINOPHEN 325 MG PO TABS
ORAL_TABLET | ORAL | Status: AC
Start: 2017-08-28 — End: 2017-08-28
  Filled 2017-08-28: qty 2

## 2017-08-28 MED ORDER — TRASTUZUMAB CHEMO 150 MG IV SOLR
450.0000 mg | Freq: Once | INTRAVENOUS | Status: AC
  Administered 2017-08-28: 450 mg via INTRAVENOUS
  Filled 2017-08-28: qty 21.43

## 2017-08-28 NOTE — Patient Instructions (Signed)
[facility] Discharge Instructions for Patients Receiving Chemotherapy  Today you received the following chemotherapy agents Herceptin  To help prevent nausea and vomiting after your treatment, we encourage you to take your nausea medication as directed   If you develop nausea and vomiting that is not controlled by your nausea medication, call the clinic.   BELOW ARE SYMPTOMS THAT SHOULD BE REPORTED IMMEDIATELY:  *FEVER GREATER THAN 100.5 F  *CHILLS WITH OR WITHOUT FEVER  NAUSEA AND VOMITING THAT IS NOT CONTROLLED WITH YOUR NAUSEA MEDICATION  *UNUSUAL SHORTNESS OF BREATH  *UNUSUAL BRUISING OR BLEEDING  TENDERNESS IN MOUTH AND THROAT WITH OR WITHOUT PRESENCE OF ULCERS  *URINARY PROBLEMS  *BOWEL PROBLEMS  UNUSUAL RASH Items with * indicate a potential emergency and should be followed up as soon as possible.  Feel free to call the clinic should you have any questions or concerns. The clinic phone number is [phone].  Please show the CHEMO ALERT CARD at check-in to the Emergency Department and triage nurse.   

## 2017-09-18 ENCOUNTER — Inpatient Hospital Stay: Payer: Medicare Other | Attending: Hematology and Oncology

## 2017-09-18 VITALS — BP 144/76 | HR 78 | Temp 98.4°F | Resp 18

## 2017-09-18 DIAGNOSIS — C50912 Malignant neoplasm of unspecified site of left female breast: Secondary | ICD-10-CM | POA: Diagnosis not present

## 2017-09-18 DIAGNOSIS — C50919 Malignant neoplasm of unspecified site of unspecified female breast: Secondary | ICD-10-CM

## 2017-09-18 DIAGNOSIS — C787 Secondary malignant neoplasm of liver and intrahepatic bile duct: Secondary | ICD-10-CM | POA: Diagnosis not present

## 2017-09-18 DIAGNOSIS — Z5112 Encounter for antineoplastic immunotherapy: Secondary | ICD-10-CM | POA: Insufficient documentation

## 2017-09-18 MED ORDER — TRASTUZUMAB CHEMO 150 MG IV SOLR
450.0000 mg | Freq: Once | INTRAVENOUS | Status: AC
  Administered 2017-09-18: 450 mg via INTRAVENOUS
  Filled 2017-09-18: qty 21.43

## 2017-09-18 MED ORDER — ACETAMINOPHEN 325 MG PO TABS
650.0000 mg | ORAL_TABLET | Freq: Once | ORAL | Status: AC
  Administered 2017-09-18: 650 mg via ORAL

## 2017-09-18 MED ORDER — HEPARIN SOD (PORK) LOCK FLUSH 100 UNIT/ML IV SOLN
500.0000 [IU] | Freq: Once | INTRAVENOUS | Status: DC | PRN
  Filled 2017-09-18: qty 5

## 2017-09-18 MED ORDER — SODIUM CHLORIDE 0.9% FLUSH
10.0000 mL | INTRAVENOUS | Status: DC | PRN
  Filled 2017-09-18: qty 10

## 2017-09-18 MED ORDER — DIPHENHYDRAMINE HCL 25 MG PO CAPS
50.0000 mg | ORAL_CAPSULE | Freq: Once | ORAL | Status: AC
  Administered 2017-09-18: 50 mg via ORAL

## 2017-09-18 MED ORDER — DIPHENHYDRAMINE HCL 25 MG PO CAPS
ORAL_CAPSULE | ORAL | Status: AC
  Filled 2017-09-18: qty 2

## 2017-09-18 MED ORDER — ACETAMINOPHEN 325 MG PO TABS
ORAL_TABLET | ORAL | Status: AC
  Filled 2017-09-18: qty 2

## 2017-09-18 MED ORDER — SODIUM CHLORIDE 0.9 % IV SOLN
Freq: Once | INTRAVENOUS | Status: AC
  Administered 2017-09-18: 09:00:00 via INTRAVENOUS

## 2017-09-18 NOTE — Patient Instructions (Signed)
[facility] Discharge Instructions for Patients Receiving Chemotherapy  Today you received the following chemotherapy agents Herceptin  To help prevent nausea and vomiting after your treatment, we encourage you to take your nausea medication as directed   If you develop nausea and vomiting that is not controlled by your nausea medication, call the clinic.   BELOW ARE SYMPTOMS THAT SHOULD BE REPORTED IMMEDIATELY:  *FEVER GREATER THAN 100.5 F  *CHILLS WITH OR WITHOUT FEVER  NAUSEA AND VOMITING THAT IS NOT CONTROLLED WITH YOUR NAUSEA MEDICATION  *UNUSUAL SHORTNESS OF BREATH  *UNUSUAL BRUISING OR BLEEDING  TENDERNESS IN MOUTH AND THROAT WITH OR WITHOUT PRESENCE OF ULCERS  *URINARY PROBLEMS  *BOWEL PROBLEMS  UNUSUAL RASH Items with * indicate a potential emergency and should be followed up as soon as possible.  Feel free to call the clinic should you have any questions or concerns. The clinic phone number is [phone].  Please show the CHEMO ALERT CARD at check-in to the Emergency Department and triage nurse.   

## 2017-09-23 ENCOUNTER — Telehealth: Payer: Self-pay

## 2017-09-23 ENCOUNTER — Other Ambulatory Visit: Payer: Self-pay

## 2017-09-23 DIAGNOSIS — C50919 Malignant neoplasm of unspecified site of unspecified female breast: Secondary | ICD-10-CM

## 2017-09-23 NOTE — Telephone Encounter (Signed)
Left VM for patient regarding a lab appt prior to CT tomorrow. Date/time provided.  Cyndia Bent RN

## 2017-09-24 ENCOUNTER — Encounter (HOSPITAL_COMMUNITY): Payer: Self-pay

## 2017-09-24 ENCOUNTER — Ambulatory Visit (HOSPITAL_COMMUNITY)
Admission: RE | Admit: 2017-09-24 | Discharge: 2017-09-24 | Disposition: A | Discharge status: Home / Self Care | Payer: Medicare Other | Source: Ambulatory Visit | Attending: Hematology and Oncology | Admitting: Hematology and Oncology

## 2017-09-24 ENCOUNTER — Inpatient Hospital Stay: Payer: Medicare Other

## 2017-09-24 DIAGNOSIS — I517 Cardiomegaly: Secondary | ICD-10-CM | POA: Diagnosis not present

## 2017-09-24 DIAGNOSIS — K449 Diaphragmatic hernia without obstruction or gangrene: Secondary | ICD-10-CM | POA: Diagnosis not present

## 2017-09-24 DIAGNOSIS — D259 Leiomyoma of uterus, unspecified: Secondary | ICD-10-CM | POA: Insufficient documentation

## 2017-09-24 DIAGNOSIS — K429 Umbilical hernia without obstruction or gangrene: Secondary | ICD-10-CM | POA: Diagnosis not present

## 2017-09-24 DIAGNOSIS — M5136 Other intervertebral disc degeneration, lumbar region: Secondary | ICD-10-CM | POA: Diagnosis not present

## 2017-09-24 DIAGNOSIS — K5730 Diverticulosis of large intestine without perforation or abscess without bleeding: Secondary | ICD-10-CM | POA: Insufficient documentation

## 2017-09-24 DIAGNOSIS — M4316 Spondylolisthesis, lumbar region: Secondary | ICD-10-CM | POA: Diagnosis not present

## 2017-09-24 DIAGNOSIS — C50919 Malignant neoplasm of unspecified site of unspecified female breast: Secondary | ICD-10-CM | POA: Diagnosis not present

## 2017-09-24 DIAGNOSIS — K7460 Unspecified cirrhosis of liver: Secondary | ICD-10-CM | POA: Diagnosis not present

## 2017-09-24 DIAGNOSIS — R599 Enlarged lymph nodes, unspecified: Secondary | ICD-10-CM | POA: Diagnosis not present

## 2017-09-24 DIAGNOSIS — I2510 Atherosclerotic heart disease of native coronary artery without angina pectoris: Secondary | ICD-10-CM | POA: Insufficient documentation

## 2017-09-24 DIAGNOSIS — I700 Atherosclerosis of aorta: Secondary | ICD-10-CM | POA: Diagnosis not present

## 2017-09-24 DIAGNOSIS — C50912 Malignant neoplasm of unspecified site of left female breast: Secondary | ICD-10-CM | POA: Diagnosis not present

## 2017-09-24 DIAGNOSIS — C787 Secondary malignant neoplasm of liver and intrahepatic bile duct: Secondary | ICD-10-CM | POA: Diagnosis not present

## 2017-09-24 DIAGNOSIS — Z5112 Encounter for antineoplastic immunotherapy: Secondary | ICD-10-CM | POA: Diagnosis not present

## 2017-09-24 LAB — CBC WITH DIFFERENTIAL/PLATELET
BASOS ABS: 0 10*3/uL (ref 0.0–0.1)
BASOS PCT: 1 %
EOS ABS: 0.1 10*3/uL (ref 0.0–0.5)
Eosinophils Relative: 3 %
HEMATOCRIT: 38 % (ref 34.8–46.6)
Hemoglobin: 12.6 g/dL (ref 11.6–15.9)
Lymphocytes Relative: 28 %
Lymphs Abs: 1.2 10*3/uL (ref 0.9–3.3)
MCH: 31.4 pg (ref 25.1–34.0)
MCHC: 33.2 g/dL (ref 31.5–36.0)
MCV: 94.8 fL (ref 79.5–101.0)
MONOS PCT: 11 %
Monocytes Absolute: 0.5 10*3/uL (ref 0.1–0.9)
NEUTROS ABS: 2.6 10*3/uL (ref 1.5–6.5)
Neutrophils Relative %: 57 %
Platelets: 195 10*3/uL (ref 145–400)
RBC: 4.01 MIL/uL (ref 3.70–5.45)
RDW: 13.9 % (ref 11.2–14.5)
WBC: 4.5 10*3/uL (ref 3.9–10.3)

## 2017-09-24 LAB — COMPREHENSIVE METABOLIC PANEL
ALK PHOS: 64 U/L (ref 40–150)
ALT: 10 U/L (ref 0–55)
ANION GAP: 10 (ref 3–11)
AST: 20 U/L (ref 5–34)
Albumin: 4 g/dL (ref 3.5–5.0)
BILIRUBIN TOTAL: 0.6 mg/dL (ref 0.2–1.2)
BUN: 14 mg/dL (ref 7–26)
CALCIUM: 9.8 mg/dL (ref 8.4–10.4)
CO2: 28 mmol/L (ref 22–29)
CREATININE: 0.84 mg/dL (ref 0.60–1.10)
Chloride: 102 mmol/L (ref 98–109)
GFR calc Af Amer: >60 mL/min (ref >60)
GFR calc non Af Amer: >60 mL/min (ref >60)
GLUCOSE: 103 mg/dL (ref 70–140)
Potassium: 4.1 mmol/L (ref 3.5–5.1)
Sodium: 140 mmol/L (ref 136–145)
TOTAL PROTEIN: 7.3 g/dL (ref 6.4–8.3)

## 2017-09-24 MED ORDER — IOPAMIDOL (ISOVUE-300) INJECTION 61%
100.0000 mL | Freq: Once | INTRAVENOUS | Status: AC | PRN
  Administered 2017-09-24: 100 mL via INTRAVENOUS

## 2017-09-24 MED ORDER — IOPAMIDOL (ISOVUE-300) INJECTION 61%
INTRAVENOUS | Status: AC
  Filled 2017-09-24: qty 100

## 2017-10-08 ENCOUNTER — Inpatient Hospital Stay: Payer: Medicare Other

## 2017-10-08 ENCOUNTER — Inpatient Hospital Stay: Payer: Medicare Other | Attending: Hematology and Oncology | Admitting: Hematology and Oncology

## 2017-10-08 ENCOUNTER — Telehealth: Payer: Self-pay | Admitting: Hematology and Oncology

## 2017-10-08 DIAGNOSIS — Z95828 Presence of other vascular implants and grafts: Secondary | ICD-10-CM

## 2017-10-08 DIAGNOSIS — C50212 Malignant neoplasm of upper-inner quadrant of left female breast: Secondary | ICD-10-CM | POA: Diagnosis not present

## 2017-10-08 DIAGNOSIS — Z7984 Long term (current) use of oral hypoglycemic drugs: Secondary | ICD-10-CM | POA: Diagnosis not present

## 2017-10-08 DIAGNOSIS — Z923 Personal history of irradiation: Secondary | ICD-10-CM | POA: Diagnosis not present

## 2017-10-08 DIAGNOSIS — C787 Secondary malignant neoplasm of liver and intrahepatic bile duct: Secondary | ICD-10-CM | POA: Diagnosis not present

## 2017-10-08 DIAGNOSIS — Z79899 Other long term (current) drug therapy: Secondary | ICD-10-CM

## 2017-10-08 DIAGNOSIS — Z5112 Encounter for antineoplastic immunotherapy: Secondary | ICD-10-CM | POA: Insufficient documentation

## 2017-10-08 DIAGNOSIS — Z9221 Personal history of antineoplastic chemotherapy: Secondary | ICD-10-CM

## 2017-10-08 DIAGNOSIS — C50919 Malignant neoplasm of unspecified site of unspecified female breast: Secondary | ICD-10-CM

## 2017-10-08 DIAGNOSIS — C773 Secondary and unspecified malignant neoplasm of axilla and upper limb lymph nodes: Secondary | ICD-10-CM

## 2017-10-08 DIAGNOSIS — Z170 Estrogen receptor positive status [ER+]: Secondary | ICD-10-CM | POA: Diagnosis not present

## 2017-10-08 DIAGNOSIS — G629 Polyneuropathy, unspecified: Secondary | ICD-10-CM | POA: Diagnosis not present

## 2017-10-08 LAB — CMP (CANCER CENTER ONLY)
ALBUMIN: 3.9 g/dL (ref 3.5–5.0)
ALK PHOS: 69 U/L (ref 40–150)
ALT: 10 U/L (ref 0–55)
AST: 21 U/L (ref 5–34)
Anion gap: 8 (ref 3–11)
BILIRUBIN TOTAL: 0.4 mg/dL (ref 0.2–1.2)
BUN: 14 mg/dL (ref 7–26)
CO2: 27 mmol/L (ref 22–29)
Calcium: 9.1 mg/dL (ref 8.4–10.4)
Chloride: 103 mmol/L (ref 98–109)
Creatinine: 0.83 mg/dL (ref 0.60–1.10)
GFR, Est AFR Am: >60 mL/min (ref >60)
GFR, Estimated: >60 mL/min (ref >60)
GLUCOSE: 81 mg/dL (ref 70–140)
Potassium: 3.8 mmol/L (ref 3.5–5.1)
Sodium: 138 mmol/L (ref 136–145)
TOTAL PROTEIN: 7.2 g/dL (ref 6.4–8.3)

## 2017-10-08 LAB — CBC WITH DIFFERENTIAL/PLATELET
BASOS ABS: 0 10*3/uL (ref 0.0–0.1)
Basophils Relative: 1 %
Eosinophils Absolute: 0.1 10*3/uL (ref 0.0–0.5)
Eosinophils Relative: 2 %
HCT: 37 % (ref 34.8–46.6)
Hemoglobin: 12.3 g/dL (ref 11.6–15.9)
Lymphocytes Relative: 24 %
Lymphs Abs: 1.2 10*3/uL (ref 0.9–3.3)
MCH: 31.5 pg (ref 25.1–34.0)
MCHC: 33.3 g/dL (ref 31.5–36.0)
MCV: 94.4 fL (ref 79.5–101.0)
MONO ABS: 0.5 10*3/uL (ref 0.1–0.9)
Monocytes Relative: 10 %
Neutro Abs: 3.1 10*3/uL (ref 1.5–6.5)
Neutrophils Relative %: 63 %
PLATELETS: 201 10*3/uL (ref 145–400)
RBC: 3.92 MIL/uL (ref 3.70–5.45)
RDW: 14 % (ref 11.2–14.5)
WBC: 5 10*3/uL (ref 3.9–10.3)

## 2017-10-08 MED ORDER — HEPARIN SOD (PORK) LOCK FLUSH 100 UNIT/ML IV SOLN
500.0000 [IU] | Freq: Once | INTRAVENOUS | Status: AC | PRN
  Administered 2017-10-08: 500 [IU]
  Filled 2017-10-08: qty 5

## 2017-10-08 MED ORDER — ACETAMINOPHEN 325 MG PO TABS
ORAL_TABLET | ORAL | Status: AC
  Filled 2017-10-08: qty 2

## 2017-10-08 MED ORDER — SODIUM CHLORIDE 0.9% FLUSH
10.0000 mL | INTRAVENOUS | Status: DC | PRN
  Administered 2017-10-08: 10 mL
  Filled 2017-10-08: qty 10

## 2017-10-08 MED ORDER — SODIUM CHLORIDE 0.9 % IV SOLN: Freq: Once | INTRAVENOUS | Status: DC

## 2017-10-08 MED ORDER — DIPHENHYDRAMINE HCL 25 MG PO CAPS
50.0000 mg | ORAL_CAPSULE | Freq: Once | ORAL | Status: AC
  Administered 2017-10-08: 50 mg via ORAL

## 2017-10-08 MED ORDER — SODIUM CHLORIDE 0.9% FLUSH
10.0000 mL | INTRAVENOUS | Status: DC | PRN
  Administered 2017-10-08: 10 mL via INTRAVENOUS
  Filled 2017-10-08: qty 10

## 2017-10-08 MED ORDER — TRASTUZUMAB CHEMO 150 MG IV SOLR
450.0000 mg | Freq: Once | INTRAVENOUS | Status: AC
  Administered 2017-10-08: 450 mg via INTRAVENOUS
  Filled 2017-10-08: qty 21.43

## 2017-10-08 MED ORDER — ACETAMINOPHEN 325 MG PO TABS
650.0000 mg | ORAL_TABLET | Freq: Once | ORAL | Status: AC
  Administered 2017-10-08: 650 mg via ORAL

## 2017-10-08 MED ORDER — DIPHENHYDRAMINE HCL 25 MG PO CAPS
ORAL_CAPSULE | ORAL | Status: AC
  Filled 2017-10-08: qty 2

## 2017-10-08 NOTE — Progress Notes (Signed)
Patient Care Team: [name], MD as PCP - General (Internal Medicine)  DIAGNOSIS:  Encounter Diagnosis  Name Primary?  . Metastatic breast cancer ([place])     SUMMARY OF ONCOLOGIC HISTORY:   Metastatic breast cancer ([place])   06/05/2006 Initial Diagnosis    Left breast biopsy: DCIS with apocrine features ER 30%, PR 13%      06/19/2006 Surgery    Left lumpectomy: stage I, pT1a, pN0(i)(sn), pMX, 0.3 cm IDC, grade 1, with extensive high-grade DCIS , margins negative ER 38 %, PR 93 %, Ki-67 10%, HER-2/neu 2+, no additional tissue available for FISH testing, with 0/1 left axillary lymph nodes.      07/30/2006 - 09/17/2006 Radiation Therapy    Adjuvant radiation therapy      09/24/2006 - 09/25/2011 Anti-estrogen oral therapy    Antiestrogen therapy with Arimidex 5 years      04/26/2016 Relapse/Recurrence    Innumerable hepatic lesions seen in both lobes most are 1-2 cm size somewhat confluent largest in the posterior right hepatic dome 3 x 4.6 cm      05/07/2016 PET scan    PET/CT scan: Extensive metastatic involvement of both lobes of the liver, primary could be liver, phalangeal or metastatic disease, hypermetabolic thoracic LN left axillary, left supraclavicular and right CP angle LN, hypermetabolic upper abdominal LN      05/16/2016 Initial Biopsy    Liver biopsy: Metastatic carcinoma breast primary strong positivity for CK 7 weak focal positivity for ER, GCDFP, negative for PR, CDX 2, TTF-1, WT 1; HER-2 positive      06/13/2016 - 11/07/2016 Chemotherapy    Taxotere Herceptin and Perjeta every 3 weeks (Taxotere was held for elevated LFTs for cycles 1 and 2)       11/28/2016 -  Chemotherapy    Herceptin maintenance therapy for metastatic breast cancer every 3 weeks        CHIEF COMPLIANT: Follow-up on Herceptin maintenance therapy for metastatic breast cancer  INTERVAL HISTORY: Monique Figueroa is a 84 year old with above-mentioned history metastatic breast cancer who is  currently on Herceptin maintenance and appears to be tolerating it very well.  She has excellent quality of life.  The scans show slight increase in the periaortic lymph nodes but not significant enough to call it progression of disease.  The liver metastases are remaining stable.  REVIEW OF SYSTEMS:   Constitutional: Denies fevers, chills or abnormal weight loss Eyes: Denies blurriness of vision Ears, nose, mouth, throat, and face: Denies mucositis or sore throat Respiratory: Denies cough, dyspnea or wheezes Cardiovascular: Denies palpitation, chest discomfort Gastrointestinal:  Denies nausea, heartburn or change in bowel habits Skin: Denies abnormal skin rashes Lymphatics: Denies new lymphadenopathy or easy bruising Neurological:Denies numbness, tingling or new weaknesses Behavioral/Psych: Mood is stable, no new changes  Extremities: No lower extremity edema Breast:  denies any pain or lumps or nodules in either breasts All other systems were reviewed with the patient and are negative.  I have reviewed the past medical history, past surgical history, social history and family history with the patient and they are unchanged from previous note.  ALLERGIES:  is allergic to perjeta [pertuzumab] and sulfa antibiotics.  MEDICATIONS:  Current Outpatient Medications  Medication Sig Dispense Refill  . hydrochlorothiazide (HYDRODIURIL) 25 MG tablet Take 25 mg by mouth daily.     Marland Kitchen levothyroxine (SYNTHROID, LEVOTHROID) 125 MCG tablet Take 125 mcg by mouth daily before breakfast.     . loratadine (CLARITIN) 10 MG tablet Take 10  mg by mouth daily as needed for allergies.    . metFORMIN (GLUCOPHAGE) 500 MG tablet Take 500 mg by mouth daily with breakfast.     . omeprazole (PRILOSEC OTC) 20 MG tablet Take 20 mg by mouth daily.     No current facility-administered medications for this visit.     PHYSICAL EXAMINATION: ECOG PERFORMANCE STATUS: 1 - Symptomatic but completely ambulatory  Vitals:    10/08/17 1153  BP: (!) 164/85  Pulse: 86  Resp: 17  Temp: 97.7 F (36.5 C)  SpO2: 98%   Filed Weights   10/08/17 1153  Weight: 177 lb 11.2 oz (80.6 kg)    GENERAL:alert, no distress and comfortable SKIN: skin color, texture, turgor are normal, no rashes or significant lesions EYES: normal, Conjunctiva are pink and non-injected, sclera clear OROPHARYNX:no exudate, no erythema and lips, buccal mucosa, and tongue normal  NECK: supple, thyroid normal size, non-tender, without nodularity LYMPH:  no palpable lymphadenopathy in the cervical, axillary or inguinal LUNGS: clear to auscultation and percussion with normal breathing effort HEART: regular rate & rhythm and no murmurs and no lower extremity edema ABDOMEN:abdomen soft, non-tender and normal bowel sounds MUSCULOSKELETAL:no cyanosis of digits and no clubbing  NEURO: alert & oriented x 3 with fluent speech, no focal motor/sensory deficits EXTREMITIES: No lower extremity edema  LABORATORY DATA:  I have reviewed the data as listed CMP Latest Ref Rng & Units 09/24/2017 08/07/2017 06/05/2017  Glucose 70 - 140 mg/dL 103 102 98  BUN 7 - 26 mg/dL 14 14.7 18.6  Creatinine 0.60 - 1.10 mg/dL 0.84 0.8 0.7  Sodium 136 - 145 mmol/L 140 139 139  Potassium 3.5 - 5.1 mmol/L 4.1 3.7 3.7  Chloride 98 - 109 mmol/L 102 - -  CO2 22 - 29 mmol/L 28 27 26  Calcium 8.4 - 10.4 mg/dL 9.8 9.3 8.8  Total Protein 6.4 - 8.3 g/dL 7.3 7.3 6.7  Total Bilirubin 0.2 - 1.2 mg/dL 0.6 0.56 0.56  Alkaline Phos 40 - 150 U/L 64 68 62  AST 5 - 34 U/L 20 22 21  ALT 0 - 55 U/L 10 14 10    Lab Results  Component Value Date   WBC 4.5 09/24/2017   HGB 12.6 09/24/2017   HCT 38.0 09/24/2017   MCV 94.8 09/24/2017   PLT 195 09/24/2017   NEUTROABS 2.6 09/24/2017    ASSESSMENT & PLAN:  Metastatic breast cancer ([facility]) Metastatic breast cancer with innumerable liver metastases detected on ultrasound and recent MRI of the liver on 04/26/2016 (Prior history of left breast  IDC T1 1 N0 stage IA 0.3 cm grade 1 tumor that was ER 38%, PR 93%, Ki-67 10%, HER-2 2+, no tissue for FISH testing, 0/1 lymph node negative, status post lumpectomy radiation and 5 years of Arimidex completed in February 2013)  PET/CT scan: 05/07/2016: Extensive metastatic involvement of both lobes of the liver, primary could be liver, phalangeal or metastatic disease, hypermetabolic thoracic lymph nodes left axillary, left supraclavicular and right CP angle lymph nodes, hypermetabolic upper abdominal lymph nodes  Liver biopsy10/07/2016: Metastatic carcinoma breast primary strong positivity for CK 7 weak focal positivity for ER, GCDFP, negative for PR, CDX 2, TTF-1, WT 1; HER-2 positive  Treatment plan: 1. Taxotere Herceptin and Perjeta every 3 weeks palliative chemotherapy started 06/13/16 (Perjeta discontinued after 5 cycles due to allergy) 2. followed by Herceptin maintenance  ----------------------------------------------------------------------------------------------------------------------------------------- Current Treatment:Herceptin maintenance Peripheral neuropathy: Not bothering her, Blindness that was transient: on81 mg aspirin daily.  Goals of   treatment: Palliation of symptoms and prolongation of life Continue Herceptin maintenance indefinitely as long as she is responding. CT chest abdomen pelvis 09/24/2017: Previous liver metastases are stable.  Mild interval enlargement of the left periaortic lymph nodes at the level of left renal vein, increase in prominence of prevascular lymph nodes  Radiology review: I discussed with the patient that the slight increase in these lymph nodes are not confirmatory of progression of disease.  We will continue to monitor her disease with scans.  Return to clinic in 12 weeksfor follow-up every 3 weeks for Herceptin. Scans will be obtained in 12 weeks and follow-up after that.   I spent 25 minutes talking to the patient of which more  than half was spent in counseling and coordination of care.  Orders Placed This Encounter  Procedures  . CT Abdomen Pelvis W Contrast    Standing Status:   Future    Standing Expiration Date:   01/08/2018    Order Specific Question:   If indicated for the ordered procedure, I authorize the administration of contrast media per Radiology protocol    Answer:   Yes    Order Specific Question:   Preferred imaging location?    Answer:   [facility]    Order Specific Question:   Radiology Contrast Protocol - do NOT remove file path    Answer:   _[email or web address]    Order Specific Question:   Reason for Exam additional comments    Answer:   Metastatic breast cancer restaging of lymph nodes   The patient has a good understanding of the overall plan. she agrees with it. she will call with any problems that may develop before the next visit here.   Harriette Ohara, MD 10/08/17

## 2017-10-08 NOTE — Telephone Encounter (Signed)
Gave avs and calendar for march °

## 2017-10-08 NOTE — Assessment & Plan Note (Signed)
Metastatic breast cancer with innumerable liver metastases detected on ultrasound and recent MRI of the liver on 04/26/2016 (Prior history of left breast IDC T1 1 N0 stage IA 0.3 cm grade 1 tumor that was ER 38%, PR 93%, Ki-67 10%, HER-2 2+, no tissue for FISH testing, 0/1 lymph node negative, status post lumpectomy radiation and 5 years of Arimidex completed in February 2013)  PET/CT scan: 05/07/2016: Extensive metastatic involvement of both lobes of the liver, primary could be liver, phalangeal or metastatic disease, hypermetabolic thoracic lymph nodes left axillary, left supraclavicular and right CP angle lymph nodes, hypermetabolic upper abdominal lymph nodes  Liver biopsy10/07/2016: Metastatic carcinoma breast primary strong positivity for CK 7 weak focal positivity for ER, GCDFP, negative for PR, CDX 2, TTF-1, WT 1; HER-2 positive  Treatment plan: 1. Taxotere Herceptin and Perjeta every 3 weeks palliative chemotherapy started 06/13/16 (Perjeta discontinued after 5 cycles due to allergy) 2. followed by Herceptin maintenance  ----------------------------------------------------------------------------------------------------------------------------------------- Current Treatment:Herceptin maintenance Peripheral neuropathy: Not bothering her, Blindness that was transient: on81 mg aspirin daily.  Goals of treatment: Palliation of symptoms and prolongation of life Continue Herceptin maintenance indefinitely as long as she is responding. CT chest abdomen pelvis 09/24/2017: Previous liver metastases are stable.  Mild interval enlargement of the left periaortic lymph nodes at the level of left renal vein, increase in prominence of prevascular lymph nodes  Radiology review: I discussed with the patient that the slight increase in these lymph nodes are not confirmatory of progression of disease.  We will continue to monitor her disease with scans.  Return to clinic in 9weeksfor follow-up  every 3 weeks for Herceptin. Scans will be obtained in 9 weeks and follow-up after that.

## 2017-10-23 ENCOUNTER — Ambulatory Visit: Payer: Medicare Other

## 2017-10-23 ENCOUNTER — Other Ambulatory Visit: Payer: Medicare Other

## 2017-10-27 ENCOUNTER — Other Ambulatory Visit: Payer: Self-pay

## 2017-10-27 ENCOUNTER — Emergency Department (HOSPITAL_COMMUNITY): Payer: Medicare Other

## 2017-10-27 ENCOUNTER — Telehealth: Payer: Self-pay

## 2017-10-27 ENCOUNTER — Emergency Department (HOSPITAL_COMMUNITY)
Admission: EM | Admit: 2017-10-27 | Discharge: 2017-10-27 | Disposition: A | Discharge status: Home / Self Care | Payer: Medicare Other | Attending: Emergency Medicine | Admitting: Emergency Medicine

## 2017-10-27 ENCOUNTER — Encounter (HOSPITAL_COMMUNITY): Payer: Self-pay | Admitting: Emergency Medicine

## 2017-10-27 DIAGNOSIS — Z7984 Long term (current) use of oral hypoglycemic drugs: Secondary | ICD-10-CM | POA: Diagnosis not present

## 2017-10-27 DIAGNOSIS — K838 Other specified diseases of biliary tract: Secondary | ICD-10-CM | POA: Diagnosis not present

## 2017-10-27 DIAGNOSIS — E039 Hypothyroidism, unspecified: Secondary | ICD-10-CM | POA: Diagnosis not present

## 2017-10-27 DIAGNOSIS — Z8505 Personal history of malignant neoplasm of liver: Secondary | ICD-10-CM | POA: Diagnosis not present

## 2017-10-27 DIAGNOSIS — K8050 Calculus of bile duct without cholangitis or cholecystitis without obstruction: Secondary | ICD-10-CM | POA: Insufficient documentation

## 2017-10-27 DIAGNOSIS — Z853 Personal history of malignant neoplasm of breast: Secondary | ICD-10-CM | POA: Insufficient documentation

## 2017-10-27 DIAGNOSIS — R1011 Right upper quadrant pain: Secondary | ICD-10-CM | POA: Diagnosis present

## 2017-10-27 DIAGNOSIS — E119 Type 2 diabetes mellitus without complications: Secondary | ICD-10-CM | POA: Diagnosis not present

## 2017-10-27 DIAGNOSIS — C787 Secondary malignant neoplasm of liver and intrahepatic bile duct: Secondary | ICD-10-CM

## 2017-10-27 DIAGNOSIS — R112 Nausea with vomiting, unspecified: Secondary | ICD-10-CM | POA: Insufficient documentation

## 2017-10-27 DIAGNOSIS — Z79899 Other long term (current) drug therapy: Secondary | ICD-10-CM | POA: Diagnosis not present

## 2017-10-27 DIAGNOSIS — R1013 Epigastric pain: Secondary | ICD-10-CM | POA: Insufficient documentation

## 2017-10-27 DIAGNOSIS — C50919 Malignant neoplasm of unspecified site of unspecified female breast: Secondary | ICD-10-CM

## 2017-10-27 DIAGNOSIS — I10 Essential (primary) hypertension: Secondary | ICD-10-CM | POA: Insufficient documentation

## 2017-10-27 DIAGNOSIS — K7460 Unspecified cirrhosis of liver: Secondary | ICD-10-CM | POA: Diagnosis not present

## 2017-10-27 LAB — CBC WITH DIFFERENTIAL/PLATELET
BASOS PCT: 0 %
Basophils Absolute: 0 10*3/uL (ref 0.0–0.1)
Eosinophils Absolute: 0 10*3/uL (ref 0.0–0.7)
Eosinophils Relative: 0 %
HCT: 45.3 % (ref 36.0–46.0)
HEMOGLOBIN: 15 g/dL (ref 12.0–15.0)
LYMPHS ABS: 0.3 10*3/uL — AB (ref 0.7–4.0)
Lymphocytes Relative: 6 %
MCH: 31.3 pg (ref 26.0–34.0)
MCHC: 33.1 g/dL (ref 30.0–36.0)
MCV: 94.6 fL (ref 78.0–100.0)
MONO ABS: 0.1 10*3/uL (ref 0.1–1.0)
MONOS PCT: 3 %
NEUTROS PCT: 91 %
Neutro Abs: 4.3 10*3/uL (ref 1.7–7.7)
Platelets: 141 10*3/uL — ABNORMAL LOW (ref 150–400)
RBC: 4.79 MIL/uL (ref 3.87–5.11)
RDW: 13.8 % (ref 11.5–15.5)
WBC: 4.7 10*3/uL (ref 4.0–10.5)

## 2017-10-27 LAB — COMPREHENSIVE METABOLIC PANEL
ALBUMIN: 3.8 g/dL (ref 3.5–5.0)
ALT: 15 U/L (ref 14–54)
ANION GAP: 9 (ref 5–15)
AST: 30 U/L (ref 15–41)
Alkaline Phosphatase: 62 U/L (ref 38–126)
BILIRUBIN TOTAL: 0.5 mg/dL (ref 0.3–1.2)
BUN: 17 mg/dL (ref 6–20)
CO2: 24 mmol/L (ref 22–32)
Calcium: 9.1 mg/dL (ref 8.9–10.3)
Chloride: 103 mmol/L (ref 101–111)
Creatinine, Ser: 0.74 mg/dL (ref 0.44–1.00)
GLUCOSE: 194 mg/dL — AB (ref 65–99)
POTASSIUM: 3.4 mmol/L — AB (ref 3.5–5.1)
Sodium: 136 mmol/L (ref 135–145)
TOTAL PROTEIN: 6.7 g/dL (ref 6.5–8.1)

## 2017-10-27 LAB — LIPASE, BLOOD: Lipase: 32 U/L (ref 11–51)

## 2017-10-27 LAB — URINALYSIS, ROUTINE W REFLEX MICROSCOPIC
BILIRUBIN URINE: NEGATIVE
Bacteria, UA: NONE SEEN
GLUCOSE, UA: 150 mg/dL — AB
KETONES UR: 5 mg/dL — AB
NITRITE: NEGATIVE
PH: 7 (ref 5.0–8.0)
Protein, ur: NEGATIVE mg/dL
SPECIFIC GRAVITY, URINE: 1.016 (ref 1.005–1.030)

## 2017-10-27 MED ORDER — HEPARIN SOD (PORK) LOCK FLUSH 100 UNIT/ML IV SOLN
500.0000 [IU] | Freq: Once | INTRAVENOUS | Status: DC
  Filled 2017-10-27: qty 5

## 2017-10-27 MED ORDER — SODIUM CHLORIDE 0.9 % IV BOLUS (SEPSIS)
1000.0000 mL | Freq: Once | INTRAVENOUS | Status: AC
  Administered 2017-10-27: 1000 mL via INTRAVENOUS

## 2017-10-27 MED ORDER — OXYCODONE-ACETAMINOPHEN 5-325 MG PO TABS: 1.0000 | ORAL_TABLET | ORAL | 0 refills | Status: DC | PRN

## 2017-10-27 MED ORDER — ONDANSETRON 8 MG PO TBDP: 8.0000 mg | ORAL_TABLET | Freq: Three times a day (TID) | ORAL | 0 refills | Status: DC | PRN

## 2017-10-27 MED ORDER — MORPHINE SULFATE (PF) 4 MG/ML IV SOLN
4.0000 mg | Freq: Once | INTRAVENOUS | Status: AC
  Administered 2017-10-27: 4 mg via INTRAVENOUS
  Filled 2017-10-27: qty 1

## 2017-10-27 MED ORDER — ONDANSETRON HCL 4 MG/2ML IJ SOLN
4.0000 mg | Freq: Once | INTRAMUSCULAR | Status: AC
  Administered 2017-10-27: 4 mg via INTRAVENOUS
  Filled 2017-10-27: qty 2

## 2017-10-27 NOTE — ED Triage Notes (Signed)
Pt reports having upper abdominal pain on right side that started a week ago and got worse tonight. Pt reports having 4 episodes of vomiting.

## 2017-10-27 NOTE — Telephone Encounter (Signed)
Received VM from pt regarding referral request to surgeon regarding recent ER visit concerning her gallbladder.  Notified Dr [name] and received referral to [facility]. Called [facility], spoke with Judson Roch and she is working on referral and will call pt.  I called pt back and let her know to expect a call from [place] at [place].  No other needs per pt at this time.

## 2017-10-27 NOTE — Discharge Instructions (Addendum)
Stay on a low fat diet. Make an appointment to see a surgeon. If pain and nausea occur, take the ondansetron for nausea, and oxycodone-acetaminophen for pain. Return to the Emergency Department if unable to control pain and nausea at home, or if you start running a fever.

## 2017-10-27 NOTE — ED Provider Notes (Signed)
[facility] DEPT Provider Note   CSN: [id number] Arrival date & time: 10/27/17  0144     History   Chief Complaint Chief Complaint  Patient presents with  . Abdominal Pain    HPI Monique Figueroa is a 84 y.o. female.   The history is provided by the patient.   Abdominal Pain    She has a history of diabetes, hypertension, hypothyroidism, breast cancer with liver metastases and comes in with episodes of epigastric and right upper quadrant pain over the last week.  She has had 3 separate episodes of pain which is epigastric and right upper quadrant with radiation to the back.  There is associated nausea and vomiting.  Initial episode occurred after eating some black pains that she thought it was gas.  She took some Alka-Seltzer and walked around and pain gradually subsided.  She had another episode about 5 days ago which was similar.  That episode came after eating soup and salad at [employer].  Tonight, pain started at 9 PM after eating cream cheese on a pretzel.  Pain was rated at 10/10, but has subsided to 6/10.  Pain does improve somewhat after vomiting, but then worsens after a few minutes.  There is been no diarrhea.  She denies fever, chills, sweats.  Of note, over the past week, when she has not had pain, she has generally not felt well with decreased appetite.  Past Medical History:  Diagnosis Date  . Appendicitis   . Breast cancer ([place]) 06/2006   Invasive ductal and DCIS of left breast  . Breast tumor    History of bilateral breast tumors/cysts  . Cyst of spinal meninges   . Diabetes mellitus without complication ([place])   . GERD (gastroesophageal reflux disease)   . Hypertension   . Hypothyroidism   . Metastatic breast cancer ([place]) 04/26/2016   mets to liver  . PONV (postoperative nausea and vomiting)   . Tuberculosis    medullary carcinoma    Patient Active Problem List   Diagnosis Date Noted  . Port catheter in place 04/03/2017    . Infusion reaction 06/13/2016  . Metastases to the liver ([place]) 05/08/2016  . Metastatic breast cancer ([place]) 06/23/2013    Past Surgical History:  Procedure Laterality Date  . APPENDECTOMY    . BREAST CYST EXCISION Bilateral    Several asprirations and excisions  . BREAST LUMPECTOMY WITH AXILLARY LYMPH NODE BIOPSY Left 06/19/2006   Invasive ductal and in situ carcinoma, node negative  . PORTACATH PLACEMENT N/A 06/05/2016   Procedure: INSERTION PORT-A-CATH WITH [place];  Surgeon: Fanny Skates, MD;  Location: [facility];  Service: General;  Laterality: N/A;  . SPINE SURGERY    . TOTAL THYROIDECTOMY       OB History   None      Home Medications    Prior to Admission medications   Medication Sig Start Date End Date Taking? Authorizing Provider  hydrochlorothiazide (HYDRODIURIL) 25 MG tablet Take 25 mg by mouth daily.  06/01/13   [provider]  levothyroxine (SYNTHROID, LEVOTHROID) 125 MCG tablet Take 125 mcg by mouth daily before breakfast.  03/26/13   [provider]  loratadine (CLARITIN) 10 MG tablet Take 10 mg by mouth daily as needed for allergies.    [provider]  metFORMIN (GLUCOPHAGE) 500 MG tablet Take 500 mg by mouth daily with breakfast.  06/04/13   [provider]  omeprazole (PRILOSEC OTC) 20 MG tablet Take  20 mg by mouth daily.    [provider]  prochlorperazine (COMPAZINE) 10 MG tablet Take 1 tablet (10 mg total) by mouth every 6 (six) hours as needed (Nausea or vomiting). 05/28/16 11/28/16  Nicholas Lose, MD    Family History Family History  Problem Relation Age of Onset  . Rheum arthritis Mother   . Coronary artery disease Mother   . Dementia Mother   . Diabetes Mother   . Heart Problems Father   . Cancer Sister        Breast cancer  . Cancer Brother        Lung cancer  . Diabetes Sister   . Dementia Sister     Social History Social History   Tobacco Use  . Smoking status: Never Smoker   . Smokeless tobacco: Never Used  Substance Use Topics  . Alcohol use: No  . Drug use: No     Allergies   Perjeta [pertuzumab] and Sulfa antibiotics   Review of Systems Review of Systems  Gastrointestinal: Positive for abdominal pain.  All other systems reviewed and are negative.    Physical Exam Updated Vital Signs BP (!) 177/77 (BP Location: Right Arm)   Pulse 89   Temp 97.7 F (36.5 C) (Oral)   Resp (!) 26   Ht 5\' 5"  (1.651 m)   Wt 80.3 kg (177 lb)   SpO2 98%   BMI 29.45 kg/m    Physical Exam  Nursing note and vitals reviewed.  84 year old female, resting comfortably and in no acute distress. Vital signs are significant for elevated systolic blood pressure and elevated respiratory rate. Oxygen saturation is 98%, which is normal. Head is normocephalic and atraumatic. PERRLA, EOMI. Oropharynx is clear. Neck is nontender and supple without adenopathy or JVD. Back is nontender and there is no CVA tenderness. Lungs are clear without rales, wheezes, or rhonchi. Chest is nontender. Heart has regular rate and rhythm without murmur. Abdomen is soft, flat, with moderate epigastric and right upper quadrant tenderness.  There is no rebound or guarding.  There is a positive Murphy sign.  There are no masses or hepatosplenomegaly and peristalsis is hypoactive. Extremities have no cyanosis or edema, full range of motion is present. Skin is warm and dry without rash. Neurologic: Mental status is normal, cranial nerves are intact, there are no motor or sensory deficits.  ED Treatments / Results  Labs (all labs ordered are listed, but only abnormal results are displayed) Labs Reviewed  COMPREHENSIVE METABOLIC PANEL - Abnormal; Notable for the following components:      Result Value   Potassium 3.4 (*)    Glucose, Bld 194 (*)    All other components within normal limits  URINALYSIS, ROUTINE W REFLEX MICROSCOPIC - Abnormal; Notable for the following components:   Glucose, UA  150 (*)    Hgb urine dipstick MODERATE (*)    Ketones, ur 5 (*)    Leukocytes, UA SMALL (*)    Squamous Epithelial / LPF 0-5 (*)    All other components within normal limits  CBC WITH DIFFERENTIAL/PLATELET - Abnormal; Notable for the following components:   Platelets 141 (*)    Lymphs Abs 0.3 (*)    All other components within normal limits  LIPASE, BLOOD   Radiology US Abdomen Complete  Result Date: 10/27/2017 CLINICAL DATA:  Abdominal pain EXAM: ABDOMEN ULTRASOUND COMPLETE COMPARISON:  CT abdomen pelvis 09/24/2017 Abdominal MRI 04/26/2016 FINDINGS: Gallbladder: There is sludge within the gallbladder. No gallbladder wall  thickening or pericholecystic fluid. No positive sonographic Murphy sign. Common bile duct: Diameter: 9.6 mm Liver: Lobulated contours and increased echogenicity. Portal vein is patent on color Doppler imaging with normal direction of blood flow towards the liver. IVC: No abnormality visualized. Pancreas: Visualized portion unremarkable. Spleen: Size and appearance within normal limits. Right Kidney: Length: 9.4 cm. Echogenicity within normal limits. No mass or hydronephrosis visualized. Left Kidney: Length: 10.8 cm. Echogenicity within normal limits. No mass or hydronephrosis visualized. Abdominal aorta: No aneurysm visualized. Other findings: None. IMPRESSION: 1. Gallbladder sludge without other evidence of acute cholecystitis. 2. Hepatic cirrhosis and dilatation of the common bile duct. Common bile duct dilatation is chronic and unchanged compared to 04/26/2016. Electronically Signed   By: Ulyses Jarred M.D.   On: 10/27/2017 04:31    Procedures Procedures  Medications Ordered in ED Medications  sodium chloride 0.9 % bolus 1,000 mL (1,000 mLs Intravenous New Bag/Given 10/27/17 0322)  ondansetron (ZOFRAN) injection 4 mg (4 mg Intravenous Given 10/27/17 0322)  morphine 4 MG/ML injection 4 mg (4 mg Intravenous Given 10/27/17 0322)     Initial Impression / Assessment and  Plan / ED Course  I have reviewed the triage vital signs and the nursing notes.  Pertinent labs & imaging results that were available during my care of the patient were reviewed by me and considered in my medical decision making (see chart for details).  Abdominal pain suspicious for biliary colic.  Consider peptic ulcer disease, GERD, diverticulitis, pancreatitis.  She does have known liver metastases, but I do not feel that pain is from liver metastases.  Old records are reviewed confirming treatment for metastatic breast cancer with trastuzumab infusions.  Patient states that next infusion is scheduled for 3 days from now.  Also, CT scans of abdomen and pelvis have shown thickened gallbladder wall, and CT on September 24, 2017 was suggestive of gallbladder sludge.  Abdominal ultrasound has been ordered.  She will be given IV fluids, morphine, ondansetron.  She feels much better after above-noted treatment.  Ultrasound confirms presence of biliary sludge and thickened gallbladder wall, but no signs of acute cholecystitis.  Given the totality of picture, I do feel that her pain is biliary colic.  She is referred to general surgery for evaluation for possible elective cholecystectomy.  She is discharged with prescriptions for ondansetron oral dissolving tablet and oxycodone-acetaminophen.  Told to stay on a low-fat diet.  Return if symptoms are not able to be controlled at home, or if she develops a fever.  Final Clinical Impressions(s) / ED Diagnoses   Final diagnoses:  Biliary colic    ED Discharge Orders        Ordered    ondansetron (ZOFRAN-ODT) 8 MG disintegrating tablet  Every 8 hours PRN     10/27/17 0526    oxyCODONE-acetaminophen (PERCOCET) 5-325 MG tablet  Every 4 hours PRN     87/86/76 7209      Delora Fuel, MD 47/09/62 [phone]

## 2017-10-30 ENCOUNTER — Other Ambulatory Visit: Payer: Self-pay | Admitting: Hematology and Oncology

## 2017-10-30 ENCOUNTER — Inpatient Hospital Stay: Payer: Medicare Other

## 2017-10-30 VITALS — BP 147/74 | HR 90 | Temp 97.8°F | Resp 18

## 2017-10-30 DIAGNOSIS — G629 Polyneuropathy, unspecified: Secondary | ICD-10-CM | POA: Diagnosis not present

## 2017-10-30 DIAGNOSIS — Z5112 Encounter for antineoplastic immunotherapy: Secondary | ICD-10-CM | POA: Diagnosis not present

## 2017-10-30 DIAGNOSIS — Z79899 Other long term (current) drug therapy: Secondary | ICD-10-CM | POA: Diagnosis not present

## 2017-10-30 DIAGNOSIS — C773 Secondary and unspecified malignant neoplasm of axilla and upper limb lymph nodes: Secondary | ICD-10-CM | POA: Diagnosis not present

## 2017-10-30 DIAGNOSIS — C50212 Malignant neoplasm of upper-inner quadrant of left female breast: Secondary | ICD-10-CM | POA: Diagnosis not present

## 2017-10-30 DIAGNOSIS — Z923 Personal history of irradiation: Secondary | ICD-10-CM | POA: Diagnosis not present

## 2017-10-30 DIAGNOSIS — Z7984 Long term (current) use of oral hypoglycemic drugs: Secondary | ICD-10-CM | POA: Diagnosis not present

## 2017-10-30 DIAGNOSIS — C50919 Malignant neoplasm of unspecified site of unspecified female breast: Secondary | ICD-10-CM

## 2017-10-30 DIAGNOSIS — Z9221 Personal history of antineoplastic chemotherapy: Secondary | ICD-10-CM | POA: Diagnosis not present

## 2017-10-30 DIAGNOSIS — C787 Secondary malignant neoplasm of liver and intrahepatic bile duct: Secondary | ICD-10-CM | POA: Diagnosis not present

## 2017-10-30 DIAGNOSIS — Z95828 Presence of other vascular implants and grafts: Secondary | ICD-10-CM

## 2017-10-30 DIAGNOSIS — Z170 Estrogen receptor positive status [ER+]: Secondary | ICD-10-CM | POA: Diagnosis not present

## 2017-10-30 MED ORDER — DIPHENHYDRAMINE HCL 25 MG PO CAPS
50.0000 mg | ORAL_CAPSULE | Freq: Once | ORAL | Status: AC
  Administered 2017-10-30: 50 mg via ORAL

## 2017-10-30 MED ORDER — SODIUM CHLORIDE 0.9% FLUSH
10.0000 mL | INTRAVENOUS | Status: DC | PRN
  Administered 2017-10-30: 10 mL
  Filled 2017-10-30: qty 10

## 2017-10-30 MED ORDER — ALTEPLASE 2 MG IJ SOLR
2.0000 mg | Freq: Once | INTRAMUSCULAR | Status: AC | PRN
  Administered 2017-10-30: 2 mg
  Filled 2017-10-30: qty 2

## 2017-10-30 MED ORDER — ACETAMINOPHEN 325 MG PO TABS
650.0000 mg | ORAL_TABLET | Freq: Once | ORAL | Status: AC
  Administered 2017-10-30: 650 mg via ORAL

## 2017-10-30 MED ORDER — SODIUM CHLORIDE 0.9 % IV SOLN
Freq: Once | INTRAVENOUS | Status: AC
  Administered 2017-10-30: 13:00:00 via INTRAVENOUS

## 2017-10-30 MED ORDER — DIPHENHYDRAMINE HCL 25 MG PO CAPS
ORAL_CAPSULE | ORAL | Status: AC
  Filled 2017-10-30: qty 2

## 2017-10-30 MED ORDER — SODIUM CHLORIDE 0.9 % IV SOLN
450.0000 mg | Freq: Once | INTRAVENOUS | Status: AC
  Administered 2017-10-30: 450 mg via INTRAVENOUS
  Filled 2017-10-30: qty 21.43

## 2017-10-30 MED ORDER — HEPARIN SOD (PORK) LOCK FLUSH 100 UNIT/ML IV SOLN
500.0000 [IU] | Freq: Once | INTRAVENOUS | Status: AC | PRN
  Administered 2017-10-30: 500 [IU]
  Filled 2017-10-30: qty 5

## 2017-10-30 NOTE — Patient Instructions (Signed)
[facility] Discharge Instructions for Patients Receiving Chemotherapy  Today you received the following chemotherapy agents Herceptin  To help prevent nausea and vomiting after your treatment, we encourage you to take your nausea medication as directed   If you develop nausea and vomiting that is not controlled by your nausea medication, call the clinic.   BELOW ARE SYMPTOMS THAT SHOULD BE REPORTED IMMEDIATELY:  *FEVER GREATER THAN 100.5 F  *CHILLS WITH OR WITHOUT FEVER  NAUSEA AND VOMITING THAT IS NOT CONTROLLED WITH YOUR NAUSEA MEDICATION  *UNUSUAL SHORTNESS OF BREATH  *UNUSUAL BRUISING OR BLEEDING  TENDERNESS IN MOUTH AND THROAT WITH OR WITHOUT PRESENCE OF ULCERS  *URINARY PROBLEMS  *BOWEL PROBLEMS  UNUSUAL RASH Items with * indicate a potential emergency and should be followed up as soon as possible.  Feel free to call the clinic should you have any questions or concerns. The clinic phone number is [phone].  Please show the CHEMO ALERT CARD at check-in to the Emergency Department and triage nurse.   

## 2017-11-13 ENCOUNTER — Other Ambulatory Visit: Payer: Medicare Other

## 2017-11-13 ENCOUNTER — Ambulatory Visit: Payer: Medicare Other

## 2017-11-20 ENCOUNTER — Inpatient Hospital Stay: Payer: Medicare Other | Attending: Hematology and Oncology

## 2017-11-20 ENCOUNTER — Inpatient Hospital Stay: Payer: Medicare Other

## 2017-11-20 VITALS — BP 140/66 | HR 69 | Temp 98.0°F | Resp 16

## 2017-11-20 DIAGNOSIS — Z5112 Encounter for antineoplastic immunotherapy: Secondary | ICD-10-CM | POA: Insufficient documentation

## 2017-11-20 DIAGNOSIS — C787 Secondary malignant neoplasm of liver and intrahepatic bile duct: Secondary | ICD-10-CM | POA: Diagnosis not present

## 2017-11-20 DIAGNOSIS — C50919 Malignant neoplasm of unspecified site of unspecified female breast: Secondary | ICD-10-CM

## 2017-11-20 DIAGNOSIS — Z95828 Presence of other vascular implants and grafts: Secondary | ICD-10-CM

## 2017-11-20 LAB — CBC WITH DIFFERENTIAL/PLATELET
Basophils Absolute: 0 10*3/uL (ref 0.0–0.1)
Basophils Relative: 1 %
EOS ABS: 0.1 10*3/uL (ref 0.0–0.5)
EOS PCT: 3 %
HCT: 35.1 % (ref 34.8–46.6)
Hemoglobin: 11.9 g/dL (ref 11.6–15.9)
Lymphocytes Relative: 24 %
Lymphs Abs: 0.9 10*3/uL (ref 0.9–3.3)
MCH: 32.1 pg (ref 25.1–34.0)
MCHC: 33.9 g/dL (ref 31.5–36.0)
MCV: 94.6 fL (ref 79.5–101.0)
Monocytes Absolute: 0.4 10*3/uL (ref 0.1–0.9)
Monocytes Relative: 9 %
Neutro Abs: 2.4 10*3/uL (ref 1.5–6.5)
Neutrophils Relative %: 63 %
PLATELETS: 186 10*3/uL (ref 145–400)
RBC: 3.71 MIL/uL (ref 3.70–5.45)
RDW: 13.3 % (ref 11.2–14.5)
WBC: 3.9 10*3/uL (ref 3.9–10.3)

## 2017-11-20 LAB — COMPREHENSIVE METABOLIC PANEL
ALK PHOS: 84 U/L (ref 40–150)
ALT: 10 U/L (ref 0–55)
AST: 19 U/L (ref 5–34)
Albumin: 3.6 g/dL (ref 3.5–5.0)
Anion gap: 6 (ref 3–11)
BILIRUBIN TOTAL: 0.4 mg/dL (ref 0.2–1.2)
BUN: 13 mg/dL (ref 7–26)
CO2: 27 mmol/L (ref 22–29)
Calcium: 8.9 mg/dL (ref 8.4–10.4)
Chloride: 105 mmol/L (ref 98–109)
Creatinine, Ser: 0.8 mg/dL (ref 0.60–1.10)
GFR calc Af Amer: >60 mL/min (ref >60)
Glucose, Bld: 163 mg/dL — ABNORMAL HIGH (ref 70–140)
Potassium: 3.5 mmol/L (ref 3.5–5.1)
Sodium: 138 mmol/L (ref 136–145)
Total Protein: 6.5 g/dL (ref 6.4–8.3)

## 2017-11-20 MED ORDER — ACETAMINOPHEN 325 MG PO TABS
ORAL_TABLET | ORAL | Status: AC
  Filled 2017-11-20: qty 2

## 2017-11-20 MED ORDER — ACETAMINOPHEN 325 MG PO TABS
650.0000 mg | ORAL_TABLET | Freq: Once | ORAL | Status: AC
  Administered 2017-11-20: 650 mg via ORAL

## 2017-11-20 MED ORDER — DIPHENHYDRAMINE HCL 25 MG PO CAPS
50.0000 mg | ORAL_CAPSULE | Freq: Once | ORAL | Status: AC
  Administered 2017-11-20: 50 mg via ORAL

## 2017-11-20 MED ORDER — TRASTUZUMAB CHEMO 150 MG IV SOLR
450.0000 mg | Freq: Once | INTRAVENOUS | Status: AC
  Administered 2017-11-20: 450 mg via INTRAVENOUS
  Filled 2017-11-20: qty 21.43

## 2017-11-20 MED ORDER — SODIUM CHLORIDE 0.9% FLUSH
10.0000 mL | INTRAVENOUS | Status: DC | PRN
  Administered 2017-11-20: 10 mL
  Filled 2017-11-20: qty 10

## 2017-11-20 MED ORDER — DIPHENHYDRAMINE HCL 25 MG PO CAPS
ORAL_CAPSULE | ORAL | Status: AC
  Filled 2017-11-20: qty 1

## 2017-11-20 MED ORDER — SODIUM CHLORIDE 0.9% FLUSH
10.0000 mL | INTRAVENOUS | Status: DC | PRN
  Administered 2017-11-20: 10 mL via INTRAVENOUS
  Filled 2017-11-20: qty 10

## 2017-11-20 MED ORDER — SODIUM CHLORIDE 0.9 % IV SOLN
Freq: Once | INTRAVENOUS | Status: AC
  Administered 2017-11-20: 11:00:00 via INTRAVENOUS

## 2017-11-20 MED ORDER — HEPARIN SOD (PORK) LOCK FLUSH 100 UNIT/ML IV SOLN
500.0000 [IU] | Freq: Once | INTRAVENOUS | Status: AC | PRN
  Administered 2017-11-20: 500 [IU]
  Filled 2017-11-20: qty 5

## 2017-11-20 NOTE — Patient Instructions (Signed)
[facility] Discharge Instructions for Patients Receiving Chemotherapy  Today you received the following chemotherapy agents Herceptin  To help prevent nausea and vomiting after your treatment, we encourage you to take your nausea medication as directed   If you develop nausea and vomiting that is not controlled by your nausea medication, call the clinic.   BELOW ARE SYMPTOMS THAT SHOULD BE REPORTED IMMEDIATELY:  *FEVER GREATER THAN 100.5 F  *CHILLS WITH OR WITHOUT FEVER  NAUSEA AND VOMITING THAT IS NOT CONTROLLED WITH YOUR NAUSEA MEDICATION  *UNUSUAL SHORTNESS OF BREATH  *UNUSUAL BRUISING OR BLEEDING  TENDERNESS IN MOUTH AND THROAT WITH OR WITHOUT PRESENCE OF ULCERS  *URINARY PROBLEMS  *BOWEL PROBLEMS  UNUSUAL RASH Items with * indicate a potential emergency and should be followed up as soon as possible.  Feel free to call the clinic should you have any questions or concerns. The clinic phone number is [phone].  Please show the CHEMO ALERT CARD at check-in to the Emergency Department and triage nurse.   

## 2017-11-24 DIAGNOSIS — K7460 Unspecified cirrhosis of liver: Secondary | ICD-10-CM | POA: Diagnosis not present

## 2017-11-24 DIAGNOSIS — C787 Secondary malignant neoplasm of liver and intrahepatic bile duct: Secondary | ICD-10-CM | POA: Diagnosis not present

## 2017-11-24 DIAGNOSIS — R1011 Right upper quadrant pain: Secondary | ICD-10-CM | POA: Diagnosis not present

## 2017-11-24 DIAGNOSIS — C50919 Malignant neoplasm of unspecified site of unspecified female breast: Secondary | ICD-10-CM | POA: Diagnosis not present

## 2017-11-24 DIAGNOSIS — Z853 Personal history of malignant neoplasm of breast: Secondary | ICD-10-CM | POA: Diagnosis not present

## 2017-11-25 ENCOUNTER — Other Ambulatory Visit (HOSPITAL_COMMUNITY): Payer: Self-pay | Admitting: General Surgery

## 2017-11-25 DIAGNOSIS — R1011 Right upper quadrant pain: Secondary | ICD-10-CM

## 2017-12-02 ENCOUNTER — Other Ambulatory Visit: Payer: Medicare Other

## 2017-12-04 ENCOUNTER — Ambulatory Visit: Payer: Medicare Other

## 2017-12-04 ENCOUNTER — Other Ambulatory Visit: Payer: Medicare Other

## 2017-12-04 ENCOUNTER — Ambulatory Visit: Payer: Medicare Other | Admitting: Hematology and Oncology

## 2017-12-09 ENCOUNTER — Encounter (HOSPITAL_COMMUNITY): Payer: Self-pay

## 2017-12-09 ENCOUNTER — Encounter (HOSPITAL_COMMUNITY)
Admission: RE | Admit: 2017-12-09 | Discharge: 2017-12-09 | Disposition: A | Discharge status: Home / Self Care | Payer: Medicare Other | Source: Ambulatory Visit | Attending: General Surgery | Admitting: General Surgery

## 2017-12-09 DIAGNOSIS — R1011 Right upper quadrant pain: Secondary | ICD-10-CM | POA: Diagnosis not present

## 2017-12-09 MED ORDER — TECHNETIUM TC 99M MEBROFENIN IV KIT
5.5000 | PACK | Freq: Once | INTRAVENOUS | Status: AC | PRN
  Administered 2017-12-09: 5.5 via INTRAVENOUS

## 2017-12-09 MED ORDER — MORPHINE SULFATE (PF) 4 MG/ML IV SOLN
3.2000 mg | Freq: Once | INTRAVENOUS | Status: AC
  Administered 2017-12-09: 3.2 mg via INTRAVENOUS
  Filled 2017-12-09 (×2): qty 0.8

## 2017-12-10 ENCOUNTER — Telehealth: Payer: Self-pay

## 2017-12-10 NOTE — Telephone Encounter (Signed)
Pt called to inform Dr.Gudena that she had gone to have a nuclear scan of her liver yesterday for gallbladder. She just wanted to make sure that her oncologist was in the loop of all this information. No further details provided.

## 2017-12-11 ENCOUNTER — Inpatient Hospital Stay: Payer: Medicare Other

## 2017-12-11 ENCOUNTER — Inpatient Hospital Stay: Payer: Medicare Other | Attending: Hematology and Oncology

## 2017-12-11 ENCOUNTER — Telehealth: Payer: Self-pay

## 2017-12-11 VITALS — BP 133/64 | HR 65 | Temp 98.5°F | Resp 18 | Ht 65.0 in | Wt 170.0 lb

## 2017-12-11 DIAGNOSIS — Z5112 Encounter for antineoplastic immunotherapy: Secondary | ICD-10-CM | POA: Diagnosis not present

## 2017-12-11 DIAGNOSIS — C787 Secondary malignant neoplasm of liver and intrahepatic bile duct: Secondary | ICD-10-CM

## 2017-12-11 DIAGNOSIS — C50912 Malignant neoplasm of unspecified site of left female breast: Secondary | ICD-10-CM | POA: Insufficient documentation

## 2017-12-11 DIAGNOSIS — Z170 Estrogen receptor positive status [ER+]: Secondary | ICD-10-CM | POA: Diagnosis not present

## 2017-12-11 DIAGNOSIS — C50919 Malignant neoplasm of unspecified site of unspecified female breast: Secondary | ICD-10-CM

## 2017-12-11 DIAGNOSIS — Z95828 Presence of other vascular implants and grafts: Secondary | ICD-10-CM

## 2017-12-11 LAB — COMPREHENSIVE METABOLIC PANEL
ALBUMIN: 3.9 g/dL (ref 3.5–5.0)
ALT: 12 U/L (ref 0–55)
AST: 23 U/L (ref 5–34)
Alkaline Phosphatase: 89 U/L (ref 40–150)
Anion gap: 6 (ref 3–11)
BUN: 11 mg/dL (ref 7–26)
CHLORIDE: 106 mmol/L (ref 98–109)
CO2: 28 mmol/L (ref 22–29)
Calcium: 9.1 mg/dL (ref 8.4–10.4)
Creatinine, Ser: 0.77 mg/dL (ref 0.60–1.10)
GFR calc Af Amer: >60 mL/min (ref >60)
Glucose, Bld: 90 mg/dL (ref 70–140)
POTASSIUM: 3.8 mmol/L (ref 3.5–5.1)
SODIUM: 140 mmol/L (ref 136–145)
Total Bilirubin: 0.4 mg/dL (ref 0.2–1.2)
Total Protein: 6.9 g/dL (ref 6.4–8.3)

## 2017-12-11 LAB — CBC WITH DIFFERENTIAL/PLATELET
Basophils Absolute: 0 10*3/uL (ref 0.0–0.1)
Basophils Relative: 1 %
Eosinophils Absolute: 0.1 10*3/uL (ref 0.0–0.5)
Eosinophils Relative: 3 %
HEMATOCRIT: 36.3 % (ref 34.8–46.6)
HEMOGLOBIN: 12.1 g/dL (ref 11.6–15.9)
LYMPHS ABS: 1.1 10*3/uL (ref 0.9–3.3)
Lymphocytes Relative: 27 %
MCH: 31.6 pg (ref 25.1–34.0)
MCHC: 33.3 g/dL (ref 31.5–36.0)
MCV: 94.8 fL (ref 79.5–101.0)
MONO ABS: 0.4 10*3/uL (ref 0.1–0.9)
Monocytes Relative: 10 %
Neutro Abs: 2.3 10*3/uL (ref 1.5–6.5)
Neutrophils Relative %: 59 %
Platelets: 192 10*3/uL (ref 145–400)
RBC: 3.83 MIL/uL (ref 3.70–5.45)
RDW: 13.4 % (ref 11.2–14.5)
WBC: 3.9 10*3/uL (ref 3.9–10.3)

## 2017-12-11 MED ORDER — ACETAMINOPHEN 325 MG PO TABS
ORAL_TABLET | ORAL | Status: AC
  Filled 2017-12-11: qty 2

## 2017-12-11 MED ORDER — ACETAMINOPHEN 325 MG PO TABS
650.0000 mg | ORAL_TABLET | Freq: Once | ORAL | Status: AC
  Administered 2017-12-11: 650 mg via ORAL

## 2017-12-11 MED ORDER — DIPHENHYDRAMINE HCL 25 MG PO CAPS
50.0000 mg | ORAL_CAPSULE | Freq: Once | ORAL | Status: AC
  Administered 2017-12-11: 50 mg via ORAL

## 2017-12-11 MED ORDER — SODIUM CHLORIDE 0.9 % IV SOLN
Freq: Once | INTRAVENOUS | Status: AC
  Administered 2017-12-11: 12:00:00 via INTRAVENOUS

## 2017-12-11 MED ORDER — SODIUM CHLORIDE 0.9% FLUSH
10.0000 mL | INTRAVENOUS | Status: DC | PRN
  Administered 2017-12-11: 10 mL via INTRAVENOUS
  Filled 2017-12-11: qty 10

## 2017-12-11 MED ORDER — TRASTUZUMAB CHEMO 150 MG IV SOLR
450.0000 mg | Freq: Once | INTRAVENOUS | Status: AC
  Administered 2017-12-11: 450 mg via INTRAVENOUS
  Filled 2017-12-11: qty 21.43

## 2017-12-11 MED ORDER — HEPARIN SOD (PORK) LOCK FLUSH 100 UNIT/ML IV SOLN
500.0000 [IU] | Freq: Once | INTRAVENOUS | Status: AC | PRN
  Administered 2017-12-11: 500 [IU]
  Filled 2017-12-11: qty 5

## 2017-12-11 MED ORDER — DIPHENHYDRAMINE HCL 25 MG PO CAPS
ORAL_CAPSULE | ORAL | Status: AC
  Filled 2017-12-11: qty 2

## 2017-12-11 MED ORDER — SODIUM CHLORIDE 0.9% FLUSH
10.0000 mL | INTRAVENOUS | Status: DC | PRN
  Administered 2017-12-11: 10 mL
  Filled 2017-12-11: qty 10

## 2017-12-11 NOTE — Patient Instructions (Signed)
[facility] Discharge Instructions for Patients Receiving Chemotherapy  Today you received the following chemotherapy agents:  Herceptin (trastuzumab)  To help prevent nausea and vomiting after your treatment, we encourage you to take your nausea medication as prescribed.   If you develop nausea and vomiting that is not controlled by your nausea medication, call the clinic.   BELOW ARE SYMPTOMS THAT SHOULD BE REPORTED IMMEDIATELY:  *FEVER GREATER THAN 100.5 F  *CHILLS WITH OR WITHOUT FEVER  NAUSEA AND VOMITING THAT IS NOT CONTROLLED WITH YOUR NAUSEA MEDICATION  *UNUSUAL SHORTNESS OF BREATH  *UNUSUAL BRUISING OR BLEEDING  TENDERNESS IN MOUTH AND THROAT WITH OR WITHOUT PRESENCE OF ULCERS  *URINARY PROBLEMS  *BOWEL PROBLEMS  UNUSUAL RASH Items with * indicate a potential emergency and should be followed up as soon as possible.  Feel free to call the clinic should you have any questions or concerns. The clinic phone number is [phone].  Please show the CHEMO ALERT CARD at check-in to the Emergency Department and triage nurse.   

## 2017-12-11 NOTE — Telephone Encounter (Addendum)
Per Elmyra Ricks RN in infusion, she would like to make Dr [name] aware that patient's echo is overdue and can we use the last Echo from January 2019 for her to receive her Herceptin today?  Per Dr [name] this is fine but this nurse will help schedule her next Echo and CT of abd before pt's appt with him in June 2019.

## 2017-12-15 ENCOUNTER — Telehealth: Payer: Self-pay

## 2017-12-15 ENCOUNTER — Other Ambulatory Visit: Payer: Self-pay

## 2017-12-15 DIAGNOSIS — Z79899 Other long term (current) drug therapy: Principal | ICD-10-CM

## 2017-12-15 DIAGNOSIS — Z5181 Encounter for therapeutic drug level monitoring: Secondary | ICD-10-CM

## 2017-12-15 NOTE — Telephone Encounter (Signed)
Called pt to confirm appt for CT scans in June. LVM with call back number to have pt call and confirm receipt of message for CT.

## 2017-12-15 NOTE — Progress Notes (Signed)
Called pt to confirm her echo appt on 12/24/17 at 11am [facility]. Pt verbalized and confirmed time/date.

## 2017-12-24 ENCOUNTER — Encounter (HOSPITAL_COMMUNITY): Payer: Self-pay

## 2017-12-24 ENCOUNTER — Ambulatory Visit (HOSPITAL_COMMUNITY)
Admission: RE | Admit: 2017-12-24 | Discharge: 2017-12-24 | Disposition: A | Discharge status: Home / Self Care | Payer: Medicare Other | Source: Ambulatory Visit | Attending: Hematology and Oncology | Admitting: Hematology and Oncology

## 2017-12-24 DIAGNOSIS — I080 Rheumatic disorders of both mitral and aortic valves: Secondary | ICD-10-CM | POA: Diagnosis not present

## 2017-12-24 DIAGNOSIS — E119 Type 2 diabetes mellitus without complications: Secondary | ICD-10-CM | POA: Diagnosis not present

## 2017-12-24 DIAGNOSIS — C50919 Malignant neoplasm of unspecified site of unspecified female breast: Secondary | ICD-10-CM | POA: Insufficient documentation

## 2017-12-24 DIAGNOSIS — Z79899 Other long term (current) drug therapy: Secondary | ICD-10-CM

## 2017-12-24 DIAGNOSIS — I119 Hypertensive heart disease without heart failure: Secondary | ICD-10-CM | POA: Diagnosis not present

## 2017-12-24 DIAGNOSIS — Z5181 Encounter for therapeutic drug level monitoring: Secondary | ICD-10-CM | POA: Diagnosis not present

## 2017-12-24 DIAGNOSIS — A159 Respiratory tuberculosis unspecified: Secondary | ICD-10-CM | POA: Insufficient documentation

## 2017-12-24 NOTE — Progress Notes (Signed)
  Echocardiogram 2D Echocardiogram has been performed.  Merrie Roof F 12/24/2017, 12:32 PM

## 2017-12-31 ENCOUNTER — Telehealth: Payer: Self-pay

## 2017-12-31 NOTE — Telephone Encounter (Signed)
Returned pt's call regarding she needs her next Herceptin appt, didn't receive a call for tomorrow and its due every 3 weeks which would be for tomorrow.  Reviewed MD notes and clarified with Dr [name]- pt should be scheduled for tomorrow for her infusion.  Notified Melanie AD and got approval to send high priority message to scheduling and notified pt of appt for tomorrow:  0745 lab, 0800 flush, and infusion for 8:30 am.  No other needs per pt at this time.

## 2018-01-01 ENCOUNTER — Inpatient Hospital Stay: Payer: Medicare Other

## 2018-01-01 VITALS — BP 149/78 | HR 73 | Temp 97.8°F | Resp 18

## 2018-01-01 DIAGNOSIS — C50912 Malignant neoplasm of unspecified site of left female breast: Secondary | ICD-10-CM | POA: Diagnosis not present

## 2018-01-01 DIAGNOSIS — C787 Secondary malignant neoplasm of liver and intrahepatic bile duct: Secondary | ICD-10-CM | POA: Diagnosis not present

## 2018-01-01 DIAGNOSIS — C50919 Malignant neoplasm of unspecified site of unspecified female breast: Secondary | ICD-10-CM

## 2018-01-01 DIAGNOSIS — Z5112 Encounter for antineoplastic immunotherapy: Secondary | ICD-10-CM | POA: Diagnosis not present

## 2018-01-01 DIAGNOSIS — Z95828 Presence of other vascular implants and grafts: Secondary | ICD-10-CM

## 2018-01-01 DIAGNOSIS — Z170 Estrogen receptor positive status [ER+]: Secondary | ICD-10-CM | POA: Diagnosis not present

## 2018-01-01 LAB — CBC WITH DIFFERENTIAL/PLATELET
BASOS ABS: 0 10*3/uL (ref 0.0–0.1)
BASOS PCT: 1 %
EOS ABS: 0.1 10*3/uL (ref 0.0–0.5)
EOS PCT: 2 %
HCT: 36.8 % (ref 34.8–46.6)
Hemoglobin: 12.4 g/dL (ref 11.6–15.9)
Lymphocytes Relative: 21 %
Lymphs Abs: 0.8 10*3/uL — ABNORMAL LOW (ref 0.9–3.3)
MCH: 31.6 pg (ref 25.1–34.0)
MCHC: 33.8 g/dL (ref 31.5–36.0)
MCV: 93.5 fL (ref 79.5–101.0)
Monocytes Absolute: 0.4 10*3/uL (ref 0.1–0.9)
Monocytes Relative: 10 %
Neutro Abs: 2.5 10*3/uL (ref 1.5–6.5)
Neutrophils Relative %: 66 %
PLATELETS: 171 10*3/uL (ref 145–400)
RBC: 3.94 MIL/uL (ref 3.70–5.45)
RDW: 13.4 % (ref 11.2–14.5)
WBC: 3.8 10*3/uL — AB (ref 3.9–10.3)

## 2018-01-01 LAB — COMPREHENSIVE METABOLIC PANEL
ALT: 13 U/L (ref 0–55)
AST: 27 U/L (ref 5–34)
Albumin: 4.1 g/dL (ref 3.5–5.0)
Alkaline Phosphatase: 99 U/L (ref 40–150)
Anion gap: 12 — ABNORMAL HIGH (ref 3–11)
BUN: 15 mg/dL (ref 7–26)
CHLORIDE: 104 mmol/L (ref 98–109)
CO2: 25 mmol/L (ref 22–29)
CREATININE: 0.79 mg/dL (ref 0.60–1.10)
Calcium: 9.3 mg/dL (ref 8.4–10.4)
GFR calc non Af Amer: >60 mL/min (ref >60)
Glucose, Bld: 125 mg/dL (ref 70–140)
Potassium: 3.3 mmol/L — ABNORMAL LOW (ref 3.5–5.1)
SODIUM: 141 mmol/L (ref 136–145)
Total Bilirubin: 0.6 mg/dL (ref 0.2–1.2)
Total Protein: 7.1 g/dL (ref 6.4–8.3)

## 2018-01-01 MED ORDER — HEPARIN SOD (PORK) LOCK FLUSH 100 UNIT/ML IV SOLN
500.0000 [IU] | Freq: Once | INTRAVENOUS | Status: AC | PRN
  Administered 2018-01-01: 500 [IU]
  Filled 2018-01-01: qty 5

## 2018-01-01 MED ORDER — ACETAMINOPHEN 325 MG PO TABS
650.0000 mg | ORAL_TABLET | Freq: Once | ORAL | Status: AC
  Administered 2018-01-01: 650 mg via ORAL

## 2018-01-01 MED ORDER — TRASTUZUMAB CHEMO 150 MG IV SOLR
450.0000 mg | Freq: Once | INTRAVENOUS | Status: AC
  Administered 2018-01-01: 450 mg via INTRAVENOUS
  Filled 2018-01-01: qty 21.43

## 2018-01-01 MED ORDER — DIPHENHYDRAMINE HCL 25 MG PO CAPS
50.0000 mg | ORAL_CAPSULE | Freq: Once | ORAL | Status: AC
  Administered 2018-01-01: 50 mg via ORAL

## 2018-01-01 MED ORDER — SODIUM CHLORIDE 0.9 % IV SOLN
Freq: Once | INTRAVENOUS | Status: AC
  Administered 2018-01-01: 08:00:00 via INTRAVENOUS

## 2018-01-01 MED ORDER — DIPHENHYDRAMINE HCL 25 MG PO CAPS
ORAL_CAPSULE | ORAL | Status: AC
  Filled 2018-01-01: qty 2

## 2018-01-01 MED ORDER — SODIUM CHLORIDE 0.9% FLUSH
10.0000 mL | INTRAVENOUS | Status: DC | PRN
  Administered 2018-01-01: 10 mL
  Filled 2018-01-01: qty 10

## 2018-01-01 MED ORDER — ACETAMINOPHEN 325 MG PO TABS
ORAL_TABLET | ORAL | Status: AC
  Filled 2018-01-01: qty 2

## 2018-01-01 MED ORDER — SODIUM CHLORIDE 0.9% FLUSH
10.0000 mL | INTRAVENOUS | Status: DC | PRN
  Administered 2018-01-01: 10 mL via INTRAVENOUS
  Filled 2018-01-01: qty 10

## 2018-01-01 NOTE — Progress Notes (Signed)
Okay to treat with recent echo dropped to 55% from 60-65% per Dr. [name].

## 2018-01-01 NOTE — Patient Instructions (Signed)
[facility] Discharge Instructions for Patients Receiving Chemotherapy  Today you received the following chemotherapy agents Herceptin  To help prevent nausea and vomiting after your treatment, we encourage you to take your nausea medication as directed   If you develop nausea and vomiting that is not controlled by your nausea medication, call the clinic.   BELOW ARE SYMPTOMS THAT SHOULD BE REPORTED IMMEDIATELY:  *FEVER GREATER THAN 100.5 F  *CHILLS WITH OR WITHOUT FEVER  NAUSEA AND VOMITING THAT IS NOT CONTROLLED WITH YOUR NAUSEA MEDICATION  *UNUSUAL SHORTNESS OF BREATH  *UNUSUAL BRUISING OR BLEEDING  TENDERNESS IN MOUTH AND THROAT WITH OR WITHOUT PRESENCE OF ULCERS  *URINARY PROBLEMS  *BOWEL PROBLEMS  UNUSUAL RASH Items with * indicate a potential emergency and should be followed up as soon as possible.  Feel free to call the clinic should you have any questions or concerns. The clinic phone number is [phone].  Please show the CHEMO ALERT CARD at check-in to the Emergency Department and triage nurse.   

## 2018-01-08 ENCOUNTER — Ambulatory Visit (HOSPITAL_COMMUNITY)
Admission: RE | Admit: 2018-01-08 | Discharge: 2018-01-08 | Disposition: A | Discharge status: Home / Self Care | Payer: Medicare Other | Source: Ambulatory Visit | Attending: Hematology and Oncology | Admitting: Hematology and Oncology

## 2018-01-08 DIAGNOSIS — Z08 Encounter for follow-up examination after completed treatment for malignant neoplasm: Secondary | ICD-10-CM | POA: Diagnosis not present

## 2018-01-08 DIAGNOSIS — C787 Secondary malignant neoplasm of liver and intrahepatic bile duct: Secondary | ICD-10-CM | POA: Diagnosis not present

## 2018-01-08 DIAGNOSIS — K828 Other specified diseases of gallbladder: Secondary | ICD-10-CM | POA: Diagnosis not present

## 2018-01-08 DIAGNOSIS — Z853 Personal history of malignant neoplasm of breast: Secondary | ICD-10-CM | POA: Insufficient documentation

## 2018-01-08 DIAGNOSIS — C50919 Malignant neoplasm of unspecified site of unspecified female breast: Secondary | ICD-10-CM

## 2018-01-08 MED ORDER — IOPAMIDOL (ISOVUE-300) INJECTION 61%
100.0000 mL | Freq: Once | INTRAVENOUS | Status: AC | PRN
  Administered 2018-01-08: 100 mL via INTRAVENOUS

## 2018-01-08 MED ORDER — IOPAMIDOL (ISOVUE-300) INJECTION 61%
INTRAVENOUS | Status: AC
  Filled 2018-01-08: qty 100

## 2018-01-14 ENCOUNTER — Inpatient Hospital Stay: Payer: Medicare Other | Attending: Hematology and Oncology | Admitting: Hematology and Oncology

## 2018-01-14 DIAGNOSIS — Z79899 Other long term (current) drug therapy: Secondary | ICD-10-CM | POA: Diagnosis not present

## 2018-01-14 DIAGNOSIS — Z170 Estrogen receptor positive status [ER+]: Secondary | ICD-10-CM | POA: Diagnosis not present

## 2018-01-14 DIAGNOSIS — Z9221 Personal history of antineoplastic chemotherapy: Secondary | ICD-10-CM | POA: Insufficient documentation

## 2018-01-14 DIAGNOSIS — Z5112 Encounter for antineoplastic immunotherapy: Secondary | ICD-10-CM | POA: Diagnosis not present

## 2018-01-14 DIAGNOSIS — Z923 Personal history of irradiation: Secondary | ICD-10-CM | POA: Diagnosis not present

## 2018-01-14 DIAGNOSIS — C50912 Malignant neoplasm of unspecified site of left female breast: Secondary | ICD-10-CM | POA: Insufficient documentation

## 2018-01-14 DIAGNOSIS — C50919 Malignant neoplasm of unspecified site of unspecified female breast: Secondary | ICD-10-CM

## 2018-01-14 DIAGNOSIS — C787 Secondary malignant neoplasm of liver and intrahepatic bile duct: Secondary | ICD-10-CM | POA: Insufficient documentation

## 2018-01-14 NOTE — Assessment & Plan Note (Signed)
Metastatic breast cancer with innumerable liver metastases detected on ultrasound and recent MRI of the liver on 04/26/2016 (Prior history of left breast IDC T1 1 N0 stage IA 0.3 cm grade 1 tumor that was ER 38%, PR 93%, Ki-67 10%, HER-2 2+, no tissue for FISH testing, 0/1 lymph node negative, status post lumpectomy radiation and 5 years of Arimidex completed in February 2013)  PET/CT scan: 05/07/2016: Extensive metastatic involvement of both lobes of the liver, primary could be liver, phalangeal or metastatic disease, hypermetabolic thoracic lymph nodes left axillary, left supraclavicular and right CP angle lymph nodes, hypermetabolic upper abdominal lymph nodes  Liver biopsy10/07/2016: Metastatic carcinoma breast primary strong positivity for CK 7 weak focal positivity for ER, GCDFP, negative for PR, CDX 2, TTF-1, WT 1; HER-2 positive  Treatment plan: 1. Taxotere Herceptin and Perjeta every 3 weeks palliative chemotherapy started 06/13/16 (Perjeta discontinued after 5 cycles due to allergy) 2. followed by Herceptin maintenance  ----------------------------------------------------------------------------------------------------------------------------------------- Current Treatment:Herceptin maintenance Peripheral neuropathy: Not bothering her, Blindness that was transient: on81 mg aspirin daily.  Goals of treatment: Palliation of symptoms and prolongation of life CT abdomen and pelvis 01/08/2018: No convincing evidence of breast cancer recurrence.  I recommend continuation of Herceptin treatments. Our plan is to do scans every 6 months.

## 2018-01-14 NOTE — Progress Notes (Signed)
Patient Care Team: [name], MD as PCP - General (Internal Medicine)  DIAGNOSIS:  Encounter Diagnosis  Name Primary?  . Metastatic breast cancer ([place])     SUMMARY OF ONCOLOGIC HISTORY:   Metastatic breast cancer ([place])   06/05/2006 Initial Diagnosis    Left breast biopsy: DCIS with apocrine features ER 30%, PR 13%      06/19/2006 Surgery    Left lumpectomy: stage I, pT1a, pN0(i)(sn), pMX, 0.3 cm IDC, grade 1, with extensive high-grade DCIS , margins negative ER 38 %, PR 93 %, Ki-67 10%, HER-2/neu 2+, no additional tissue available for FISH testing, with 0/1 left axillary lymph nodes.      07/30/2006 - 09/17/2006 Radiation Therapy    Adjuvant radiation therapy      09/24/2006 - 09/25/2011 Anti-estrogen oral therapy    Antiestrogen therapy with Arimidex 5 years      04/26/2016 Relapse/Recurrence    Innumerable hepatic lesions seen in both lobes most are 1-2 cm size somewhat confluent largest in the posterior right hepatic dome 3 x 4.6 cm      05/07/2016 PET scan    PET/CT scan: Extensive metastatic involvement of both lobes of the liver, primary could be liver, phalangeal or metastatic disease, hypermetabolic thoracic LN left axillary, left supraclavicular and right CP angle LN, hypermetabolic upper abdominal LN      05/16/2016 Initial Biopsy    Liver biopsy: Metastatic carcinoma breast primary strong positivity for CK 7 weak focal positivity for ER, GCDFP, negative for PR, CDX 2, TTF-1, WT 1; HER-2 positive      06/13/2016 - 11/07/2016 Chemotherapy    Taxotere Herceptin and Perjeta every 3 weeks (Taxotere was held for elevated LFTs for cycles 1 and 2)       11/28/2016 -  Chemotherapy    Herceptin maintenance therapy for metastatic breast cancer every 3 weeks        CHIEF COMPLIANT: Herceptin maintenance  INTERVAL HISTORY: Monique Figueroa is a 84 year old with above-mentioned history of metastatic breast cancer who is currently on Herceptin maintenance and appears to  be tolerating it extremely well.  She had a CT abdomen pelvis recently which did not show any evidence of liver metastases.  So she continues to remain in remission.  REVIEW OF SYSTEMS:   Constitutional: Denies fevers, chills or abnormal weight loss Eyes: Denies blurriness of vision Ears, nose, mouth, throat, and face: Denies mucositis or sore throat Respiratory: Denies cough, dyspnea or wheezes Cardiovascular: Denies palpitation, chest discomfort Gastrointestinal:  Denies nausea, heartburn or change in bowel habits Skin: Denies abnormal skin rashes Lymphatics: Denies new lymphadenopathy or easy bruising Neurological:Denies numbness, tingling or new weaknesses Behavioral/Psych: Mood is stable, no new changes  Extremities: No lower extremity edema  All other systems were reviewed with the patient and are negative.  I have reviewed the past medical history, past surgical history, social history and family history with the patient and they are unchanged from previous note.  ALLERGIES:  is allergic to perjeta [pertuzumab] and sulfa antibiotics.  MEDICATIONS:  Current Outpatient Medications  Medication Sig Dispense Refill  . acetaminophen (TYLENOL) 325 MG tablet Take 325 mg by mouth every 6 (six) hours as needed for moderate pain.    Marland Kitchen BIOTIN PO Take 1 tablet by mouth daily.    Marland Kitchen levothyroxine (SYNTHROID, LEVOTHROID) 125 MCG tablet Take 125 mcg by mouth daily before breakfast.     . metFORMIN (GLUCOPHAGE) 500 MG tablet Take 500 mg by mouth daily with breakfast.     .  omeprazole (PRILOSEC OTC) 20 MG tablet Take 20 mg by mouth daily.    . ondansetron (ZOFRAN-ODT) 8 MG disintegrating tablet Take 1 tablet (8 mg total) by mouth every 8 (eight) hours as needed for nausea or vomiting. 20 tablet 0  . oxyCODONE-acetaminophen (PERCOCET) 5-325 MG tablet Take 1 tablet by mouth every 4 (four) hours as needed for moderate pain. 15 tablet 0  . Propylene Glycol (SYSTANE BALANCE) 0.6 % SOLN Apply 1 drop to  eye 2 (two) times daily.     No current facility-administered medications for this visit.     PHYSICAL EXAMINATION: ECOG PERFORMANCE STATUS: 1 - Symptomatic but completely ambulatory  Vitals:   01/14/18 1051  BP: (!) 167/77  Pulse: 84  Resp: 17  Temp: 98.4 F (36.9 C)  SpO2: 100%   Filed Weights   01/14/18 1051  Weight: 164 lb 14.4 oz (74.8 kg)    GENERAL:alert, no distress and comfortable SKIN: skin color, texture, turgor are normal, no rashes or significant lesions EYES: normal, Conjunctiva are pink and non-injected, sclera clear OROPHARYNX:no exudate, no erythema and lips, buccal mucosa, and tongue normal  NECK: supple, thyroid normal size, non-tender, without nodularity LYMPH:  no palpable lymphadenopathy in the cervical, axillary or inguinal LUNGS: clear to auscultation and percussion with normal breathing effort HEART: regular rate & rhythm and no murmurs and no lower extremity edema ABDOMEN:abdomen soft, non-tender and normal bowel sounds MUSCULOSKELETAL:no cyanosis of digits and no clubbing  NEURO: alert & oriented x 3 with fluent speech, no focal motor/sensory deficits EXTREMITIES: No lower extremity edema Is is LABORATORY DATA:  I have reviewed the data as listed CMP Latest Ref Rng & Units 01/01/2018 12/11/2017 11/20/2017  Glucose 70 - 140 mg/dL 125 90 163(H)  BUN 7 - 26 mg/dL '15 11 13  '$ Creatinine 0.60 - 1.10 mg/dL 0.79 0.77 0.80  Sodium 136 - 145 mmol/L 141 140 138  Potassium 3.5 - 5.1 mmol/L 3.3(L) 3.8 3.5  Chloride 98 - 109 mmol/L 104 106 105  CO2 22 - 29 mmol/L '25 28 27  '$ Calcium 8.4 - 10.4 mg/dL 9.3 9.1 8.9  Total Protein 6.4 - 8.3 g/dL 7.1 6.9 6.5  Total Bilirubin 0.2 - 1.2 mg/dL 0.6 0.4 0.4  Alkaline Phos 40 - 150 U/L 99 89 84  AST 5 - 34 U/L '27 23 19  '$ ALT 0 - 55 U/L '13 12 10    '$ Lab Results  Component Value Date   WBC 3.8 (L) 01/01/2018   HGB 12.4 01/01/2018   HCT 36.8 01/01/2018   MCV 93.5 01/01/2018   PLT 171 01/01/2018   NEUTROABS 2.5  01/01/2018    ASSESSMENT & PLAN:  Metastatic breast cancer ([place]) Metastatic breast cancer with innumerable liver metastases detected on ultrasound and recent MRI of the liver on 04/26/2016 (Prior history of left breast IDC T1 1 N0 stage IA 0.3 cm grade 1 tumor that was ER 38%, PR 93%, Ki-67 10%, HER-2 2+, no tissue for FISH testing, 0/1 lymph node negative, status post lumpectomy radiation and 5 years of Arimidex completed in February 2013)  PET/CT scan: 05/07/2016: Extensive metastatic involvement of both lobes of the liver, primary could be liver, phalangeal or metastatic disease, hypermetabolic thoracic lymph nodes left axillary, left supraclavicular and right CP angle lymph nodes, hypermetabolic upper abdominal lymph nodes  Liver biopsy10/07/2016: Metastatic carcinoma breast primary strong positivity for CK 7 weak focal positivity for ER, GCDFP, negative for PR, CDX 2, TTF-1, WT 1; HER-2 positive  Treatment plan: 1.  Taxotere Herceptin and Perjeta every 3 weeks palliative chemotherapy started 06/13/16 (Perjeta discontinued after 5 cycles due to allergy) 2. followed by Herceptin maintenance  ----------------------------------------------------------------------------------------------------------------------------------------- Current Treatment:Herceptin maintenance Peripheral neuropathy: Not bothering her, Blindness that was transient: on81 mg aspirin daily.  Goals of treatment: Palliation of symptoms and prolongation of life CT abdomen and pelvis 01/08/2018: No convincing evidence of breast cancer recurrence.  Gallbladder issues: I sent a message to Dr. Dalbert Batman that if he thinks that she needs a cholecystectomy this would be right time to do it.  Patient has been on a broth and no fat diet and has been losing some weight.  She would be scared of having a gallbladder attack as soon as she eats fatty food.  Ejection fraction 55%: Previously she was up to 60 to 65%.  I sent a message  to Dr. Haroldine Laws to review her echocardiograms and decide if she needs any changes to her Herceptin treatment.  I recommend continuation of Herceptin treatments. Our plan is to do scans every 6 months.     No orders of the defined types were placed in this encounter.  The patient has a good understanding of the overall plan. she agrees with it. she will call with any problems that may develop before the next visit here.   Harriette Ohara, MD 01/14/18

## 2018-01-21 DIAGNOSIS — E119 Type 2 diabetes mellitus without complications: Secondary | ICD-10-CM | POA: Diagnosis not present

## 2018-01-21 DIAGNOSIS — H43813 Vitreous degeneration, bilateral: Secondary | ICD-10-CM | POA: Diagnosis not present

## 2018-01-21 DIAGNOSIS — H18469 Peripheral corneal degeneration, unspecified eye: Secondary | ICD-10-CM | POA: Diagnosis not present

## 2018-01-21 DIAGNOSIS — H04123 Dry eye syndrome of bilateral lacrimal glands: Secondary | ICD-10-CM | POA: Diagnosis not present

## 2018-01-21 DIAGNOSIS — H353131 Nonexudative age-related macular degeneration, bilateral, early dry stage: Secondary | ICD-10-CM | POA: Diagnosis not present

## 2018-01-22 ENCOUNTER — Inpatient Hospital Stay: Payer: Medicare Other

## 2018-01-22 VITALS — BP 160/68 | HR 74 | Temp 97.9°F | Resp 18 | Wt 162.8 lb

## 2018-01-22 DIAGNOSIS — C787 Secondary malignant neoplasm of liver and intrahepatic bile duct: Secondary | ICD-10-CM | POA: Diagnosis not present

## 2018-01-22 DIAGNOSIS — C50912 Malignant neoplasm of unspecified site of left female breast: Secondary | ICD-10-CM | POA: Diagnosis not present

## 2018-01-22 DIAGNOSIS — Z5112 Encounter for antineoplastic immunotherapy: Secondary | ICD-10-CM | POA: Diagnosis not present

## 2018-01-22 DIAGNOSIS — Z79899 Other long term (current) drug therapy: Secondary | ICD-10-CM | POA: Diagnosis not present

## 2018-01-22 DIAGNOSIS — Z170 Estrogen receptor positive status [ER+]: Secondary | ICD-10-CM | POA: Diagnosis not present

## 2018-01-22 DIAGNOSIS — C50919 Malignant neoplasm of unspecified site of unspecified female breast: Secondary | ICD-10-CM

## 2018-01-22 DIAGNOSIS — Z9221 Personal history of antineoplastic chemotherapy: Secondary | ICD-10-CM | POA: Diagnosis not present

## 2018-01-22 DIAGNOSIS — Z923 Personal history of irradiation: Secondary | ICD-10-CM | POA: Diagnosis not present

## 2018-01-22 LAB — CBC WITH DIFFERENTIAL/PLATELET
BASOS PCT: 1 %
Basophils Absolute: 0 10*3/uL (ref 0.0–0.1)
EOS ABS: 0.1 10*3/uL (ref 0.0–0.5)
EOS PCT: 2 %
HCT: 36.8 % (ref 34.8–46.6)
HEMOGLOBIN: 12.2 g/dL (ref 11.6–15.9)
LYMPHS ABS: 0.7 10*3/uL — AB (ref 0.9–3.3)
Lymphocytes Relative: 11 %
MCH: 31.5 pg (ref 25.1–34.0)
MCHC: 33.2 g/dL (ref 31.5–36.0)
MCV: 95.1 fL (ref 79.5–101.0)
MONOS PCT: 9 %
Monocytes Absolute: 0.6 10*3/uL (ref 0.1–0.9)
NEUTROS PCT: 77 %
Neutro Abs: 4.7 10*3/uL (ref 1.5–6.5)
PLATELETS: 208 10*3/uL (ref 145–400)
RBC: 3.87 MIL/uL (ref 3.70–5.45)
RDW: 13.7 % (ref 11.2–14.5)
WBC: 6.1 10*3/uL (ref 3.9–10.3)

## 2018-01-22 LAB — COMPREHENSIVE METABOLIC PANEL
ALBUMIN: 3.9 g/dL (ref 3.5–5.0)
ALK PHOS: 115 U/L (ref 40–150)
ALT: 11 U/L (ref 0–55)
ANION GAP: 10 (ref 3–11)
AST: 29 U/L (ref 5–34)
BUN: 12 mg/dL (ref 7–26)
CHLORIDE: 104 mmol/L (ref 98–109)
CO2: 26 mmol/L (ref 22–29)
Calcium: 9.7 mg/dL (ref 8.4–10.4)
Creatinine, Ser: 0.78 mg/dL (ref 0.60–1.10)
GFR calc non Af Amer: >60 mL/min (ref >60)
GLUCOSE: 122 mg/dL (ref 70–140)
Potassium: 3.6 mmol/L (ref 3.5–5.1)
SODIUM: 140 mmol/L (ref 136–145)
Total Bilirubin: 0.5 mg/dL (ref 0.2–1.2)
Total Protein: 7.1 g/dL (ref 6.4–8.3)

## 2018-01-22 MED ORDER — ACETAMINOPHEN 325 MG PO TABS
ORAL_TABLET | ORAL | Status: AC
  Filled 2018-01-22: qty 2

## 2018-01-22 MED ORDER — DIPHENHYDRAMINE HCL 25 MG PO CAPS
50.0000 mg | ORAL_CAPSULE | Freq: Once | ORAL | Status: AC
  Administered 2018-01-22: 50 mg via ORAL

## 2018-01-22 MED ORDER — DIPHENHYDRAMINE HCL 25 MG PO CAPS
ORAL_CAPSULE | ORAL | Status: AC
  Filled 2018-01-22: qty 2

## 2018-01-22 MED ORDER — SODIUM CHLORIDE 0.9% FLUSH
10.0000 mL | INTRAVENOUS | Status: DC | PRN
  Administered 2018-01-22: 10 mL
  Filled 2018-01-22: qty 10

## 2018-01-22 MED ORDER — SODIUM CHLORIDE 0.9 % IV SOLN
Freq: Once | INTRAVENOUS | Status: AC
Start: 2018-01-22 — End: 2018-01-22
  Administered 2018-01-22: 09:00:00 via INTRAVENOUS

## 2018-01-22 MED ORDER — ACETAMINOPHEN 325 MG PO TABS
650.0000 mg | ORAL_TABLET | Freq: Once | ORAL | Status: AC
  Administered 2018-01-22: 650 mg via ORAL

## 2018-01-22 MED ORDER — TRASTUZUMAB CHEMO 150 MG IV SOLR
450.0000 mg | Freq: Once | INTRAVENOUS | Status: AC
  Administered 2018-01-22: 450 mg via INTRAVENOUS
  Filled 2018-01-22: qty 21.43

## 2018-01-22 MED ORDER — HEPARIN SOD (PORK) LOCK FLUSH 100 UNIT/ML IV SOLN
500.0000 [IU] | Freq: Once | INTRAVENOUS | Status: AC | PRN
Start: 2018-01-22 — End: 2018-01-22
  Administered 2018-01-22: 500 [IU]
  Filled 2018-01-22: qty 5

## 2018-01-22 NOTE — Patient Instructions (Signed)
[facility] Discharge Instructions for Patients Receiving Chemotherapy  Today you received the following chemotherapy agents Herceptin  To help prevent nausea and vomiting after your treatment, we encourage you to take your nausea medication as directed   If you develop nausea and vomiting that is not controlled by your nausea medication, call the clinic.   BELOW ARE SYMPTOMS THAT SHOULD BE REPORTED IMMEDIATELY:  *FEVER GREATER THAN 100.5 F  *CHILLS WITH OR WITHOUT FEVER  NAUSEA AND VOMITING THAT IS NOT CONTROLLED WITH YOUR NAUSEA MEDICATION  *UNUSUAL SHORTNESS OF BREATH  *UNUSUAL BRUISING OR BLEEDING  TENDERNESS IN MOUTH AND THROAT WITH OR WITHOUT PRESENCE OF ULCERS  *URINARY PROBLEMS  *BOWEL PROBLEMS  UNUSUAL RASH Items with * indicate a potential emergency and should be followed up as soon as possible.  Feel free to call the clinic should you have any questions or concerns. The clinic phone number is [phone].  Please show the CHEMO ALERT CARD at check-in to the Emergency Department and triage nurse.   

## 2018-02-09 ENCOUNTER — Telehealth: Payer: Self-pay | Admitting: Hematology and Oncology

## 2018-02-09 NOTE — Telephone Encounter (Signed)
Left vm for pt re possibly moving lab to 7/10 due to lab meeting

## 2018-02-11 NOTE — Assessment & Plan Note (Signed)
Metastatic breast cancer with innumerable liver metastases detected on ultrasound and recent MRI of the liver on 04/26/2016 (Prior history of left breast IDC T1 1 N0 stage IA 0.3 cm grade 1 tumor that was ER 38%, PR 93%, Ki-67 10%, HER-2 2+, no tissue for FISH testing, 0/1 lymph node negative, status post lumpectomy radiation and 5 years of Arimidex completed in February 2013)  PET/CT scan: 05/07/2016: Extensive metastatic involvement of both lobes of the liver, primary could be liver, phalangeal or metastatic disease, hypermetabolic thoracic lymph nodes left axillary, left supraclavicular and right CP angle lymph nodes, hypermetabolic upper abdominal lymph nodes  Liver biopsy10/07/2016: Metastatic carcinoma breast primary strong positivity for CK 7 weak focal positivity for ER, GCDFP, negative for PR, CDX 2, TTF-1, WT 1; HER-2 positive  Treatment plan: 1. Taxotere Herceptin and Perjeta every 3 weeks palliative chemotherapy started 06/13/16 (Perjeta discontinued after 5 cycles due to allergy) 2. followed by Herceptin maintenance  ----------------------------------------------------------------------------------------------------------------------------------------- Current Treatment:Herceptin maintenance Peripheral neuropathy: Not bothering her, Blindness that was transient: on81 mg aspirin daily.  Goals of treatment: Palliation of symptoms and prolongation of life CT abdomen and pelvis 01/08/2018: No convincing evidence of breast cancer recurrence.  I recommend continuation of Herceptin treatments. Our plan is to do scans every 6 months.  

## 2018-02-12 ENCOUNTER — Other Ambulatory Visit: Payer: Self-pay

## 2018-02-12 ENCOUNTER — Inpatient Hospital Stay: Payer: Medicare Other | Attending: Hematology and Oncology

## 2018-02-12 ENCOUNTER — Telehealth: Payer: Self-pay | Admitting: Hematology and Oncology

## 2018-02-12 ENCOUNTER — Inpatient Hospital Stay: Payer: Medicare Other

## 2018-02-12 ENCOUNTER — Inpatient Hospital Stay (HOSPITAL_BASED_OUTPATIENT_CLINIC_OR_DEPARTMENT_OTHER): Payer: Medicare Other | Admitting: Hematology and Oncology

## 2018-02-12 DIAGNOSIS — R110 Nausea: Secondary | ICD-10-CM | POA: Insufficient documentation

## 2018-02-12 DIAGNOSIS — Z79899 Other long term (current) drug therapy: Secondary | ICD-10-CM | POA: Diagnosis not present

## 2018-02-12 DIAGNOSIS — C50912 Malignant neoplasm of unspecified site of left female breast: Secondary | ICD-10-CM

## 2018-02-12 DIAGNOSIS — C787 Secondary malignant neoplasm of liver and intrahepatic bile duct: Secondary | ICD-10-CM | POA: Insufficient documentation

## 2018-02-12 DIAGNOSIS — Z95828 Presence of other vascular implants and grafts: Secondary | ICD-10-CM

## 2018-02-12 DIAGNOSIS — Z7984 Long term (current) use of oral hypoglycemic drugs: Secondary | ICD-10-CM

## 2018-02-12 DIAGNOSIS — Z9221 Personal history of antineoplastic chemotherapy: Secondary | ICD-10-CM | POA: Diagnosis not present

## 2018-02-12 DIAGNOSIS — Z5112 Encounter for antineoplastic immunotherapy: Secondary | ICD-10-CM | POA: Diagnosis not present

## 2018-02-12 DIAGNOSIS — Z923 Personal history of irradiation: Secondary | ICD-10-CM | POA: Diagnosis not present

## 2018-02-12 DIAGNOSIS — C50919 Malignant neoplasm of unspecified site of unspecified female breast: Secondary | ICD-10-CM

## 2018-02-12 DIAGNOSIS — Z170 Estrogen receptor positive status [ER+]: Secondary | ICD-10-CM | POA: Insufficient documentation

## 2018-02-12 DIAGNOSIS — T8090XD Unspecified complication following infusion and therapeutic injection, subsequent encounter: Secondary | ICD-10-CM

## 2018-02-12 LAB — COMPREHENSIVE METABOLIC PANEL
ALBUMIN: 4 g/dL (ref 3.5–5.0)
ALT: 23 U/L (ref 0–44)
AST: 40 U/L (ref 15–41)
Alkaline Phosphatase: 165 U/L — ABNORMAL HIGH (ref 38–126)
Anion gap: 10 (ref 5–15)
BUN: 9 mg/dL (ref 8–23)
CHLORIDE: 105 mmol/L (ref 98–111)
CO2: 25 mmol/L (ref 22–32)
Calcium: 9.1 mg/dL (ref 8.9–10.3)
Creatinine, Ser: 0.8 mg/dL (ref 0.44–1.00)
GFR calc Af Amer: >60 mL/min (ref >60)
Glucose, Bld: 126 mg/dL — ABNORMAL HIGH (ref 70–99)
POTASSIUM: 3.9 mmol/L (ref 3.5–5.1)
Sodium: 140 mmol/L (ref 135–145)
Total Bilirubin: 0.7 mg/dL (ref 0.3–1.2)
Total Protein: 7.1 g/dL (ref 6.5–8.1)

## 2018-02-12 LAB — CBC WITH DIFFERENTIAL/PLATELET
Basophils Absolute: 0 10*3/uL (ref 0.0–0.1)
Basophils Relative: 1 %
EOS PCT: 3 %
Eosinophils Absolute: 0.1 10*3/uL (ref 0.0–0.5)
HEMATOCRIT: 37.3 % (ref 34.8–46.6)
HEMOGLOBIN: 12.4 g/dL (ref 11.6–15.9)
LYMPHS ABS: 0.9 10*3/uL (ref 0.9–3.3)
LYMPHS PCT: 21 %
MCH: 31.6 pg (ref 25.1–34.0)
MCHC: 33.2 g/dL (ref 31.5–36.0)
MCV: 95.2 fL (ref 79.5–101.0)
Monocytes Absolute: 0.4 10*3/uL (ref 0.1–0.9)
Monocytes Relative: 11 %
NEUTROS ABS: 2.6 10*3/uL (ref 1.5–6.5)
Neutrophils Relative %: 64 %
PLATELETS: 214 10*3/uL (ref 145–400)
RBC: 3.92 MIL/uL (ref 3.70–5.45)
RDW: 13.7 % (ref 11.2–14.5)
WBC: 4.1 10*3/uL (ref 3.9–10.3)

## 2018-02-12 MED ORDER — SODIUM CHLORIDE 0.9% FLUSH
10.0000 mL | INTRAVENOUS | Status: DC | PRN
  Administered 2018-02-12: 10 mL via INTRAVENOUS
  Filled 2018-02-12: qty 10

## 2018-02-12 MED ORDER — SODIUM CHLORIDE 0.9 % IV SOLN
Freq: Once | INTRAVENOUS | Status: AC
  Administered 2018-02-12: 10:00:00 via INTRAVENOUS

## 2018-02-12 MED ORDER — HEPARIN SOD (PORK) LOCK FLUSH 100 UNIT/ML IV SOLN
500.0000 [IU] | Freq: Once | INTRAVENOUS | Status: DC | PRN
  Filled 2018-02-12: qty 5

## 2018-02-12 MED ORDER — SODIUM CHLORIDE 0.9% FLUSH
10.0000 mL | INTRAVENOUS | Status: DC | PRN
  Filled 2018-02-12: qty 10

## 2018-02-12 MED ORDER — DIPHENHYDRAMINE HCL 25 MG PO CAPS
50.0000 mg | ORAL_CAPSULE | Freq: Once | ORAL | Status: AC
  Administered 2018-02-12: 50 mg via ORAL

## 2018-02-12 MED ORDER — ACETAMINOPHEN 325 MG PO TABS
650.0000 mg | ORAL_TABLET | Freq: Once | ORAL | Status: AC
  Administered 2018-02-12: 650 mg via ORAL

## 2018-02-12 MED ORDER — TRASTUZUMAB CHEMO 150 MG IV SOLR
450.0000 mg | Freq: Once | INTRAVENOUS | Status: AC
  Administered 2018-02-12: 450 mg via INTRAVENOUS
  Filled 2018-02-12: qty 21.43

## 2018-02-12 MED ORDER — ONDANSETRON 8 MG PO TBDP: 8.0000 mg | ORAL_TABLET | Freq: Three times a day (TID) | ORAL | 1 refills | Status: DC | PRN

## 2018-02-12 MED ORDER — OXYCODONE-ACETAMINOPHEN 5-325 MG PO TABS: 1.0000 | ORAL_TABLET | ORAL | 0 refills | Status: DC | PRN

## 2018-02-12 MED ORDER — DIPHENHYDRAMINE HCL 25 MG PO CAPS
ORAL_CAPSULE | ORAL | Status: AC
  Filled 2018-02-12: qty 2

## 2018-02-12 MED ORDER — ACETAMINOPHEN 325 MG PO TABS
ORAL_TABLET | ORAL | Status: AC
Start: 2018-02-12 — End: ?
  Filled 2018-02-12: qty 2

## 2018-02-12 NOTE — Patient Instructions (Signed)
[facility] Discharge Instructions for Patients Receiving Chemotherapy  Today you received the following chemotherapy agents Herceptin  To help prevent nausea and vomiting after your treatment, we encourage you to take your nausea medication as directed   If you develop nausea and vomiting that is not controlled by your nausea medication, call the clinic.   BELOW ARE SYMPTOMS THAT SHOULD BE REPORTED IMMEDIATELY:  *FEVER GREATER THAN 100.5 F  *CHILLS WITH OR WITHOUT FEVER  NAUSEA AND VOMITING THAT IS NOT CONTROLLED WITH YOUR NAUSEA MEDICATION  *UNUSUAL SHORTNESS OF BREATH  *UNUSUAL BRUISING OR BLEEDING  TENDERNESS IN MOUTH AND THROAT WITH OR WITHOUT PRESENCE OF ULCERS  *URINARY PROBLEMS  *BOWEL PROBLEMS  UNUSUAL RASH Items with * indicate a potential emergency and should be followed up as soon as possible.  Feel free to call the clinic should you have any questions or concerns. The clinic phone number is [phone].  Please show the CHEMO ALERT CARD at check-in to the Emergency Department and triage nurse.   

## 2018-02-12 NOTE — Telephone Encounter (Signed)
Per 7/11 los already done gave avs and calendar

## 2018-02-12 NOTE — Progress Notes (Signed)
Patient Care Team: [name], MD as PCP - General (Internal Medicine)  DIAGNOSIS:  Encounter Diagnosis  Name Primary?  . Metastatic breast cancer ([place])     SUMMARY OF ONCOLOGIC HISTORY:   Metastatic breast cancer ([place])   06/05/2006 Initial Diagnosis    Left breast biopsy: DCIS with apocrine features ER 30%, PR 13%      06/19/2006 Surgery    Left lumpectomy: stage I, pT1a, pN0(i)(sn), pMX, 0.3 cm IDC, grade 1, with extensive high-grade DCIS , margins negative ER 38 %, PR 93 %, Ki-67 10%, HER-2/neu 2+, no additional tissue available for FISH testing, with 0/1 left axillary lymph nodes.      07/30/2006 - 09/17/2006 Radiation Therapy    Adjuvant radiation therapy      09/24/2006 - 09/25/2011 Anti-estrogen oral therapy    Antiestrogen therapy with Arimidex 5 years      04/26/2016 Relapse/Recurrence    Innumerable hepatic lesions seen in both lobes most are 1-2 cm size somewhat confluent largest in the posterior right hepatic dome 3 x 4.6 cm      05/07/2016 PET scan    PET/CT scan: Extensive metastatic involvement of both lobes of the liver, primary could be liver, phalangeal or metastatic disease, hypermetabolic thoracic LN left axillary, left supraclavicular and right CP angle LN, hypermetabolic upper abdominal LN      05/16/2016 Initial Biopsy    Liver biopsy: Metastatic carcinoma breast primary strong positivity for CK 7 weak focal positivity for ER, GCDFP, negative for PR, CDX 2, TTF-1, WT 1; HER-2 positive      06/13/2016 - 11/07/2016 Chemotherapy    Taxotere Herceptin and Perjeta every 3 weeks (Taxotere was held for elevated LFTs for cycles 1 and 2)       11/28/2016 -  Chemotherapy    Herceptin maintenance therapy for metastatic breast cancer every 3 weeks        CHIEF COMPLIANT: Follow-up on Herceptin maintenance  INTERVAL HISTORY: Monique Figueroa is a 84 year old with above-mentioned history of metastatic breast cancer who had a complete response to therapy with  no evidence of disease in the liver and is currently on Herceptin maintenance therapy.  She has no side effects of Herceptin.  Recently she was diagnosed with gallbladder problems probably cholecystitis and is seeing surgery to evaluate for surgical options.  She takes Percocets and she ran out of Percocets and and is requiring some new prescription for that.  She also takes Zofran because of the nausea that cholecystitis is causing.  REVIEW OF SYSTEMS:   Constitutional: Denies fevers, chills or abnormal weight loss Eyes: Denies blurriness of vision Ears, nose, mouth, throat, and face: Denies mucositis or sore throat Respiratory: Denies cough, dyspnea or wheezes Cardiovascular: Denies palpitation, chest discomfort Gastrointestinal: Nausea and right upper quadrant abdominal pain Skin: Denies abnormal skin rashes Lymphatics: Denies new lymphadenopathy or easy bruising Neurological:Denies numbness, tingling or new weaknesses Behavioral/Psych: Mood is stable, no new changes  Extremities: No lower extremity edema Breast:  denies any pain or lumps or nodules in either breasts All other systems were reviewed with the patient and are negative.  I have reviewed the past medical history, past surgical history, social history and family history with the patient and they are unchanged from previous note.  ALLERGIES:  is allergic to perjeta [pertuzumab] and sulfa antibiotics.  MEDICATIONS:  Current Outpatient Medications  Medication Sig Dispense Refill  . acetaminophen (TYLENOL) 325 MG tablet Take 325 mg by mouth every 6 (six) hours as needed  for moderate pain.    Marland Kitchen BIOTIN PO Take 1 tablet by mouth daily.    Marland Kitchen levothyroxine (SYNTHROID, LEVOTHROID) 125 MCG tablet Take 125 mcg by mouth daily before breakfast.     . metFORMIN (GLUCOPHAGE) 500 MG tablet Take 500 mg by mouth daily with breakfast.     . omeprazole (PRILOSEC OTC) 20 MG tablet Take 20 mg by mouth daily.    . ondansetron (ZOFRAN-ODT) 8 MG  disintegrating tablet Take 1 tablet (8 mg total) by mouth every 8 (eight) hours as needed for nausea or vomiting. 20 tablet 1  . oxyCODONE-acetaminophen (PERCOCET) 5-325 MG tablet Take 1 tablet by mouth every 4 (four) hours as needed for moderate pain. 30 tablet 0  . Propylene Glycol (SYSTANE BALANCE) 0.6 % SOLN Apply 1 drop to eye 2 (two) times daily.     No current facility-administered medications for this visit.     PHYSICAL EXAMINATION: ECOG PERFORMANCE STATUS: 1 - Symptomatic but completely ambulatory  Vitals:   02/12/18 0820  BP: (!) 148/79  Pulse: 77  Resp: 17  Temp: 98 F (36.7 C)  SpO2: 98%   Filed Weights   02/12/18 0820  Weight: 160 lb (72.6 kg)    GENERAL:alert, no distress and comfortable SKIN: skin color, texture, turgor are normal, no rashes or significant lesions EYES: normal, Conjunctiva are pink and non-injected, sclera clear OROPHARYNX:no exudate, no erythema and lips, buccal mucosa, and tongue normal  NECK: supple, thyroid normal size, non-tender, without nodularity LYMPH:  no palpable lymphadenopathy in the cervical, axillary or inguinal LUNGS: clear to auscultation and percussion with normal breathing effort HEART: regular rate & rhythm and no murmurs and no lower extremity edema ABDOMEN: Right upper quadrant abdominal pain MUSCULOSKELETAL:no cyanosis of digits and no clubbing  NEURO: alert & oriented x 3 with fluent speech, no focal motor/sensory deficits EXTREMITIES: No lower extremity edema   LABORATORY DATA:  I have reviewed the data as listed CMP Latest Ref Rng & Units 02/12/2018 01/22/2018 01/01/2018  Glucose 70 - 99 mg/dL 126(H) 122 125  BUN 8 - 23 mg/dL _0 Creatinine 0.44 - 1.00 mg/dL 0.80 0.78 0.79  Sodium 135 - 145 mmol/L 140 140 141  Potassium 3.5 - 5.1 mmol/L 3.9 3.6 3.3(L)  Chloride 98 - 111 mmol/L 105 104 104  CO2 22 - 32 mmol/L _1 Calcium 8.9 - 10.3 mg/dL 9.1 9.7 9.3  Total Protein 6.5 - 8.1 g/dL 7.1 7.1 7.1  Total  Bilirubin 0.3 - 1.2 mg/dL 0.7 0.5 0.6  Alkaline Phos 38 - 126 U/L 165(H) 115 99  AST 15 - 41 U/L 40 29 27  ALT 0 - 44 U/L _2 Lab Results  Component Value Date   WBC 4.1 02/12/2018   HGB 12.4 02/12/2018   HCT 37.3 02/12/2018   MCV 95.2 02/12/2018   PLT 214 02/12/2018   NEUTROABS 2.6 02/12/2018    ASSESSMENT & PLAN:  Metastatic breast cancer ([facility]) Metastatic breast cancer with innumerable liver metastases detected on ultrasound and recent MRI of the liver on 04/26/2016 (Prior history of left breast IDC T1 1 N0 stage IA 0.3 cm grade 1 tumor that was ER 38%, PR 93%, Ki-67 10%, HER-2 2+, no tissue for FISH testing, 0/1 lymph node negative, status post lumpectomy radiation and 5 years of Arimidex completed in February 2013)  PET/CT scan: 05/07/2016: Extensive metastatic involvement of both lobes of the liver, primary could be liver, phalangeal or metastatic  disease, hypermetabolic thoracic lymph nodes left axillary, left supraclavicular and right CP angle lymph nodes, hypermetabolic upper abdominal lymph nodes  Liver biopsy10/07/2016: Metastatic carcinoma breast primary strong positivity for CK 7 weak focal positivity for ER, GCDFP, negative for PR, CDX 2, TTF-1, WT 1; HER-2 positive  Treatment plan: 1. Taxotere Herceptin and Perjeta every 3 weeks palliative chemotherapy started 06/13/16 (Perjeta discontinued after 5 cycles due to allergy) 2. followed by Herceptin maintenance  ----------------------------------------------------------------------------------------------------------------------------------------- Current Treatment:Herceptin maintenance Peripheral neuropathy: Not bothering her, Blindness that was transient: on81 mg aspirin daily.  Right upper quadrant abdominal pain: Probably cholecystitis.  She is seeing surgery to discuss her options. Since she does not have any active liver metastases, she could undergo the surgery from oncology standpoint.  Goals of  treatment: Palliation of symptoms and prolongation of life CT abdomen and pelvis 01/08/2018: No convincing evidence of breast cancer recurrence.  I recommend continuation of Herceptin treatments. Our plan is to do scans every 6 months.  No orders of the defined types were placed in this encounter.  The patient has a good understanding of the overall plan. she agrees with it. she will call with any problems that may develop before the next visit here.   Harriette Ohara, MD 02/12/18

## 2018-02-25 ENCOUNTER — Telehealth: Payer: Self-pay

## 2018-02-25 ENCOUNTER — Other Ambulatory Visit: Payer: Self-pay | Admitting: General Surgery

## 2018-02-25 DIAGNOSIS — K811 Chronic cholecystitis: Secondary | ICD-10-CM | POA: Diagnosis not present

## 2018-02-25 DIAGNOSIS — K7460 Unspecified cirrhosis of liver: Secondary | ICD-10-CM | POA: Diagnosis not present

## 2018-02-25 DIAGNOSIS — C50919 Malignant neoplasm of unspecified site of unspecified female breast: Secondary | ICD-10-CM | POA: Diagnosis not present

## 2018-02-25 DIAGNOSIS — Z853 Personal history of malignant neoplasm of breast: Secondary | ICD-10-CM | POA: Diagnosis not present

## 2018-02-25 DIAGNOSIS — C787 Secondary malignant neoplasm of liver and intrahepatic bile duct: Secondary | ICD-10-CM | POA: Diagnosis not present

## 2018-02-25 NOTE — Telephone Encounter (Signed)
Patient called regarding gallbladder surgery being scheduled for 03/24/2018.  Patient scheduled for Herceptin infusion on 03/26/2018, inquiring if she should keep appointment or reschedule.  Patient is maintenance Herceptin (lifelong).  Informed patient we would speak with Dr. [name] and get his recommendations and follow up next week when he returns to office.  Patient voiced understanding and agreement.

## 2018-03-02 ENCOUNTER — Telehealth: Payer: Self-pay

## 2018-03-02 NOTE — Telephone Encounter (Signed)
Outgoing call to patient regarding recent telephone note per Kathlee Nations RN - pt is scheduled for Herceptin on 8-22 but has gallbladder surgery on 8/20, so is it ok to keep appt or reschedule later in week?  Per Dr [name], ok to keep appt, Herceptin can be given 2 days after surgery.  Pt voiced understanding and is agreeable to keeping her scheduled appt for 8/22- she will call us if any need to reschedule but is ok with 8/22 for now. No other needs per pt at this time.

## 2018-03-03 ENCOUNTER — Other Ambulatory Visit: Payer: Self-pay | Admitting: Hematology and Oncology

## 2018-03-05 ENCOUNTER — Other Ambulatory Visit: Payer: Medicare Other

## 2018-03-05 ENCOUNTER — Ambulatory Visit: Payer: Medicare Other | Admitting: Hematology and Oncology

## 2018-03-05 ENCOUNTER — Inpatient Hospital Stay: Payer: Medicare Other | Attending: Hematology and Oncology

## 2018-03-05 VITALS — BP 145/68 | HR 69 | Temp 97.8°F | Resp 18

## 2018-03-05 DIAGNOSIS — Z5112 Encounter for antineoplastic immunotherapy: Secondary | ICD-10-CM | POA: Diagnosis not present

## 2018-03-05 DIAGNOSIS — C50912 Malignant neoplasm of unspecified site of left female breast: Secondary | ICD-10-CM | POA: Diagnosis not present

## 2018-03-05 DIAGNOSIS — Z170 Estrogen receptor positive status [ER+]: Secondary | ICD-10-CM | POA: Diagnosis not present

## 2018-03-05 DIAGNOSIS — C50919 Malignant neoplasm of unspecified site of unspecified female breast: Secondary | ICD-10-CM

## 2018-03-05 MED ORDER — HEPARIN SOD (PORK) LOCK FLUSH 100 UNIT/ML IV SOLN
500.0000 [IU] | Freq: Once | INTRAVENOUS | Status: AC | PRN
  Administered 2018-03-05: 500 [IU]
  Filled 2018-03-05: qty 5

## 2018-03-05 MED ORDER — ACETAMINOPHEN 325 MG PO TABS
650.0000 mg | ORAL_TABLET | Freq: Once | ORAL | Status: AC
  Administered 2018-03-05: 650 mg via ORAL

## 2018-03-05 MED ORDER — DIPHENHYDRAMINE HCL 25 MG PO CAPS
ORAL_CAPSULE | ORAL | Status: AC
  Filled 2018-03-05: qty 2

## 2018-03-05 MED ORDER — TRASTUZUMAB CHEMO 150 MG IV SOLR
450.0000 mg | Freq: Once | INTRAVENOUS | Status: AC
  Administered 2018-03-05: 450 mg via INTRAVENOUS
  Filled 2018-03-05: qty 21.43

## 2018-03-05 MED ORDER — DIPHENHYDRAMINE HCL 25 MG PO CAPS
50.0000 mg | ORAL_CAPSULE | Freq: Once | ORAL | Status: AC
  Administered 2018-03-05: 50 mg via ORAL

## 2018-03-05 MED ORDER — SODIUM CHLORIDE 0.9 % IV SOLN
Freq: Once | INTRAVENOUS | Status: AC
  Administered 2018-03-05: 12:00:00 via INTRAVENOUS
  Filled 2018-03-05: qty 250

## 2018-03-05 MED ORDER — ACETAMINOPHEN 325 MG PO TABS
ORAL_TABLET | ORAL | Status: AC
Start: 2018-03-05 — End: ?
  Filled 2018-03-05: qty 2

## 2018-03-05 MED ORDER — SODIUM CHLORIDE 0.9% FLUSH
10.0000 mL | INTRAVENOUS | Status: DC | PRN
  Administered 2018-03-05: 10 mL
  Filled 2018-03-05: qty 10

## 2018-03-13 NOTE — Progress Notes (Signed)
Echo 12-24-17 epic   Ct chest 09-24-17 epic

## 2018-03-13 NOTE — Patient Instructions (Signed)
Monique Figueroa  03/13/2018   Your procedure is scheduled on: 03-24-18    Report to [facility] Main  Entrance    Report to admitting at 6:00AM    Call this number if you have problems the morning of surgery [phone]     Remember: NO SOLID FOOD AFTER MIDNIGHT THE NIGHT PRIOR TO SURGERY. NOTHING BY MOUTH EXCEPT CLEAR LIQUIDS UNTIL 3 HOURS PRIOR TO Keith SURGERY. PLEASE FINISH ENSURE DRINK PER SURGEON ORDER 3 HOURS PRIOR TO SCHEDULED SURGERY TIME WHICH NEEDS TO BE COMPLETED AT _____5:00AM_______.   CLEAR LIQUID DIET   Foods Allowed                                                                     Foods Excluded  Coffee and tea, regular and decaf                             liquids that you cannot  Plain Jell-O in any flavor                                             see through such as: Fruit ices (not with fruit pulp)                                     milk, soups, orange juice  Iced Popsicles                                    All solid food Carbonated beverages, regular and diet                                    Cranberry, grape and apple juices Sports drinks like Gatorade Lightly seasoned clear broth or consume(fat free) Sugar, honey syrup  Sample Menu Breakfast                                Lunch                                     Supper Cranberry juice                    Beef broth                            Chicken broth Jell-O                                     Grape juice  Apple juice Coffee or tea                        Jell-O                                      Popsicle                                                Coffee or tea                        Coffee or tea  _____________________________________________________________________       Take these medicines the morning of surgery with A SIP OF WATER: LEVOTHYROXINE, PRILOSEC IF NEEDED, TYLENOL IF NEEDED, OXYCODONE IF NEEDED                              You may not have any metal on your body including hair pins and              piercings  Do not wear jewelry, make-up, lotions, powders or perfumes, deodorant             Do not wear nail polish.  Do not shave  48 hours prior to surgery.       Do not bring valuables to the hospital. [place].  Contacts, dentures or bridgework may not be worn into surgery.      Patients discharged the day of surgery will not be allowed to drive home.  Name and phone number of your driver:  Special Instructions: N/A              Please read over the following fact sheets you were given: _____________________________________________________________________             How to Manage Your Diabetes Before and After Surgery  Why is it important to control my blood sugar before and after surgery? . Improving blood sugar levels before and after surgery helps healing and can limit problems. . A way of improving blood sugar control is eating a healthy diet by: o  Eating less sugar and carbohydrates o  Increasing activity/exercise o  Talking with your doctor about reaching your blood sugar goals . High blood sugars (greater than 180 mg/dL) can raise your risk of infections and slow your recovery, so you will need to focus on controlling your diabetes during the weeks before surgery. . Make sure that the doctor who takes care of your diabetes knows about your planned surgery including the date and location.  How do I manage my blood sugar before surgery? . Check your blood sugar at least 4 times a day, starting 2 days before surgery, to make sure that the level is not too high or low. o Check your blood sugar the morning of your surgery when you wake up and every 2 hours until you get to the Short Stay unit. . If your blood sugar is less than 70 mg/dL, you will need to treat for low blood sugar: o Do not take insulin. o Treat a low  blood sugar (less  than 70 mg/dL) with  cup of clear juice (cranberry or apple), 4 glucose tablets, OR glucose gel. o Recheck blood sugar in 15 minutes after treatment (to make sure it is greater than 70 mg/dL). If your blood sugar is not greater than 70 mg/dL on recheck, call [phone] for further instructions. . Report your blood sugar to the short stay nurse when you get to Short Stay.  . If you are admitted to the hospital after surgery: o Your blood sugar will be checked by the staff and you will probably be given insulin after surgery (instead of oral diabetes medicines) to make sure you have good blood sugar levels. o The goal for blood sugar control after surgery is 80-180 mg/dL.   WHAT DO I DO ABOUT MY DIABETES MEDICATION?       . THE MORNING OF SURGERY, Do not take oral diabetes medicines (pills) !   Patient Signature:  Date:   Nurse Signature:  Date:   Reviewed and Endorsed by [facility] Patient Education Committee, August 2015    [facility] - Preparing for Surgery Before surgery, you can play an important role.  Because skin is not sterile, your skin needs to be as free of germs as possible.  You can reduce the number of germs on your skin by washing with CHG (chlorahexidine gluconate) soap before surgery.  CHG is an antiseptic cleaner which kills germs and bonds with the skin to continue killing germs even after washing. Please DO NOT use if you have an allergy to CHG or antibacterial soaps.  If your skin becomes reddened/irritated stop using the CHG and inform your nurse when you arrive at Short Stay. Do not shave (including legs and underarms) for at least 48 hours prior to the first CHG shower.  You may shave your face/neck. Please follow these instructions carefully:  1.  Shower with CHG Soap the night before surgery and the  morning of Surgery.  2.  If you choose to wash your hair, wash your hair first as usual with your  normal  shampoo.  3.  After you shampoo, rinse your hair  and body thoroughly to remove the  shampoo.                           4.  Use CHG as you would any other liquid soap.  You can apply chg directly  to the skin and wash                       Gently with a scrungie or clean washcloth.  5.  Apply the CHG Soap to your body ONLY FROM THE NECK DOWN.   Do not use on face/ open                           Wound or open sores. Avoid contact with eyes, ears mouth and genitals (private parts).                       Wash face,  Genitals (private parts) with your normal soap.             6.  Wash thoroughly, paying special attention to the area where your surgery  will be performed.  7.  Thoroughly rinse your body with warm water from the neck down.  8.  DO NOT shower/wash  with your normal soap after using and rinsing off  the CHG Soap.                9.  Pat yourself dry with a clean towel.            10.  Wear clean pajamas.            11.  Place clean sheets on your bed the night of your first shower and do not  sleep with pets. Day of Surgery : Do not apply any lotions/deodorants the morning of surgery.  Please wear clean clothes to the hospital/surgery center.  FAILURE TO FOLLOW THESE INSTRUCTIONS MAY RESULT IN THE CANCELLATION OF YOUR SURGERY PATIENT SIGNATURE_________________________________  NURSE SIGNATURE__________________________________  ________________________________________________________________________

## 2018-03-16 ENCOUNTER — Other Ambulatory Visit: Payer: Self-pay

## 2018-03-16 ENCOUNTER — Encounter (HOSPITAL_COMMUNITY)
Admission: RE | Admit: 2018-03-16 | Discharge: 2018-03-16 | Disposition: A | Discharge status: Home / Self Care | Payer: Medicare Other | Source: Ambulatory Visit | Attending: General Surgery | Admitting: General Surgery

## 2018-03-16 ENCOUNTER — Encounter (HOSPITAL_COMMUNITY): Payer: Self-pay

## 2018-03-16 DIAGNOSIS — I10 Essential (primary) hypertension: Secondary | ICD-10-CM | POA: Diagnosis not present

## 2018-03-16 DIAGNOSIS — Z01810 Encounter for preprocedural cardiovascular examination: Secondary | ICD-10-CM | POA: Diagnosis not present

## 2018-03-16 DIAGNOSIS — R9431 Abnormal electrocardiogram [ECG] [EKG]: Secondary | ICD-10-CM | POA: Insufficient documentation

## 2018-03-16 DIAGNOSIS — E119 Type 2 diabetes mellitus without complications: Secondary | ICD-10-CM | POA: Insufficient documentation

## 2018-03-16 DIAGNOSIS — I4510 Unspecified right bundle-branch block: Secondary | ICD-10-CM | POA: Diagnosis not present

## 2018-03-16 DIAGNOSIS — Z01812 Encounter for preprocedural laboratory examination: Secondary | ICD-10-CM | POA: Diagnosis not present

## 2018-03-16 DIAGNOSIS — K811 Chronic cholecystitis: Secondary | ICD-10-CM | POA: Insufficient documentation

## 2018-03-16 HISTORY — DX: Urinary tract infection, site not specified: N39.0

## 2018-03-16 HISTORY — DX: Cholecystitis, unspecified: K81.9

## 2018-03-16 LAB — COMPREHENSIVE METABOLIC PANEL
ALBUMIN: 4 g/dL (ref 3.5–5.0)
ALT: 68 U/L — ABNORMAL HIGH (ref 0–44)
AST: 99 U/L — ABNORMAL HIGH (ref 15–41)
Alkaline Phosphatase: 189 U/L — ABNORMAL HIGH (ref 38–126)
Anion gap: 9 (ref 5–15)
BILIRUBIN TOTAL: 0.7 mg/dL (ref 0.3–1.2)
BUN: 9 mg/dL (ref 8–23)
CO2: 27 mmol/L (ref 22–32)
Calcium: 9.3 mg/dL (ref 8.9–10.3)
Chloride: 104 mmol/L (ref 98–111)
Creatinine, Ser: 0.71 mg/dL (ref 0.44–1.00)
GFR calc Af Amer: >60 mL/min (ref >60)
GFR calc non Af Amer: >60 mL/min (ref >60)
Glucose, Bld: 102 mg/dL — ABNORMAL HIGH (ref 70–99)
POTASSIUM: 4.3 mmol/L (ref 3.5–5.1)
Sodium: 140 mmol/L (ref 135–145)
TOTAL PROTEIN: 7 g/dL (ref 6.5–8.1)

## 2018-03-16 LAB — CBC WITH DIFFERENTIAL/PLATELET
BASOS PCT: 0 %
Basophils Absolute: 0 10*3/uL (ref 0.0–0.1)
Eosinophils Absolute: 0.1 10*3/uL (ref 0.0–0.7)
Eosinophils Relative: 2 %
HEMATOCRIT: 36.4 % (ref 36.0–46.0)
HEMOGLOBIN: 12.2 g/dL (ref 12.0–15.0)
Lymphocytes Relative: 19 %
Lymphs Abs: 0.8 10*3/uL (ref 0.7–4.0)
MCH: 32 pg (ref 26.0–34.0)
MCHC: 33.5 g/dL (ref 30.0–36.0)
MCV: 95.5 fL (ref 78.0–100.0)
MONO ABS: 0.5 10*3/uL (ref 0.1–1.0)
Monocytes Relative: 12 %
NEUTROS ABS: 2.9 10*3/uL (ref 1.7–7.7)
NEUTROS PCT: 67 %
Platelets: 201 10*3/uL (ref 150–400)
RBC: 3.81 MIL/uL — ABNORMAL LOW (ref 3.87–5.11)
RDW: 13.9 % (ref 11.5–15.5)
WBC: 4.3 10*3/uL (ref 4.0–10.5)

## 2018-03-16 LAB — GLUCOSE, CAPILLARY: GLUCOSE-CAPILLARY: 107 mg/dL — AB (ref 70–99)

## 2018-03-17 LAB — HEMOGLOBIN A1C
HEMOGLOBIN A1C: 5.5 % (ref 4.8–5.6)
MEAN PLASMA GLUCOSE: 111 mg/dL

## 2018-03-17 NOTE — Progress Notes (Signed)
cmp routed via epic to Dr Dalbert Batman

## 2018-03-21 NOTE — H&P (Signed)
Monique Figueroa Location: [facility] Patient #: [id number] DOB: 1934-02-19 Married / Language: English / Race: White Female       History of Present Illness       This is an 84 year old female who returns with continued gallbladder symptoms to discuss elective cholecystectomy. She also has stage IV breast cancer with liver metastasis but apparently they're under good control. She gets Herceptin chemotherapy every few weeks and is tolerating that well. Her performance status is very good. Monique Figueroa is her PCP. Her son is with her throughout the encounter.      On June 19, 2006 Dr. Lennie Hummer performed left lumpectomy for a stage I cancer with negative margins and positive receptors and HER-2 positive. She received radiation therapy and Arimidex for 5 years. She has some vague abdominal discomfort for 2 years. Baseline October 2017 she had CT scans and a PET scan which showed multiple liver metastasis and positive left axillary supraclavicular and right costophrenic angle and upper abdominal lymph nodes. Liver biopsy was positive for metastatic carcinoma. ER week. HER-2 positive. We put a Port-A-Cath and it she's been stable on Herceptin chemotherapy ever since.      She is now developed intermittent attacks of biliary colic. She describes this as every 3 or 4 weeks. Right upper quadrant. She has bloating and burping and nausea and some bilious vomiting. This last for a few hours and then goes away. Her ultrasound shows sludge but no wall thickening. CBD is chronically dilated at 9.6 mm. Liver function tests have been normal. CT scan on September 25, 2007 2019 shows liver lesions and cirrhosis and a small umbilical hernia. More recent imaging shows that the liver metastasis under good control. We went ahead with hepatobiliary scan which showed nonvisualization of her gallbladder She continues to have intermittent attacks and wants her gallbladder removed Dr.  [name] says that she would be a good candidate at this time due to control of the metastasis in the liver. On imaging studies I do not see any obvious involvement of the gallbladder by tumor.     Social history reveals her husband died about 20 months ago. Earlier in their marriage she had a gangrenous gallbladder.      Exam today was unremarkable I think she is having gallbladder attacks She will be scheduled for laparoscopic cholecystectomy with cholangiogram, possible open cholecystectomy told her and her son that this might be straightforward or it could be complicated if the liver metastasis interfered with the surgical technique. Possibility of partial cholecystectomy was discussed. Increased risk for complications. Not prohibitive, however I discussed the indications, details, techniques, and numerous risk of surgery with her and her son. We went over the patient information booklet and I gave her a copy as well. The surgery will be scheduled urgently.   Problem List/Past Medical HISTORY OF LEFT BREAST CANCER (Z85.3)  RUQ PAIN (R10.11)  SEBACEOUS CYST OF LEFT AXILLA (L72.3)  BREAST CANCER METASTASIZED TO LIVER, UNSPECIFIED LATERALITY (C50.919)  CIRRHOSIS, NONALCOHOLIC (W10.93)  HYPERTENSION, BENIGN (I10)  DIABETES TYPE 2, CONTROLLED (E11.9)  CHRONIC GERD (K21.9)  HISTORY OF THYROID CANCER (Z85.850)   Past Surgical History  Appendectomy  Breast Biopsy  Bilateral. Breast Mass; Local Excision  Left. Cataract Surgery  Bilateral. Spinal Surgery - Lower Back  Thyroid Surgery   Diagnostic Studies History Colonoscopy  never Mammogram  within last year Pap Smear  >5 years ago  Allergies  Sulfa Drugs   Medication History  Levothyroxine Sodium (125MCG  Tablet, Oral) Active. MetFORMIN HCl ('500MG'$  Tablet, Oral) Active. Omeprazole ('20MG'$  Capsule DR, Oral) Active. Ondansetron ('8MG'$  Tablet Disint, Oral) Active. Oxycodone-Acetaminophen (5-'325MG'$  Tablet, Oral)  Active. Propylene Glycol (0.6% Solution, Ophthalmic) Active. Tylenol ('325MG'$  Tablet, Oral) Active. Biotin (Oral) Specific strength unknown - Active. Medications Reconciled  Social History  Caffeine use  Coffee. No alcohol use  No drug use  Tobacco use  Never smoker.  Family History  Arthritis  Mother, Sister. Breast Cancer  Sister. Cancer  Brother. Diabetes Mellitus  Brother, Mother, Son. Hypertension  Mother.  Pregnancy / Birth History Age at menarche  89 years. Age of menopause  56-55 Gravida  2 Maternal age  73-25 Para  2  Other Problems  Arthritis  Breast Cancer  Diabetes Mellitus  Gastroesophageal Reflux Disease  Thyroid Cancer  Thyroid Disease   Vitals  Weight: 160.4 lb Height: 65in Body Surface Area: 1.8 m Body Mass Index: 26.69 kg/m  BP: 132/84 (Sitting, Left Arm, Standard)     Physical Exam  General Mental Status-Alert. General Appearance-Not in acute distress. Build & Nutrition-Well nourished. Posture-Normal posture. Gait-Normal.  Head and Neck Note: No cervical or supraclavicular adenopathy. 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 Trachea midline. Voice normal.   Chest and Lung Exam Chest and lung exam reveals -on auscultation, normal breath sounds, no adventitious sounds and normal vocal resonance. Note: Port-A-Cath site looks fine.   Breast Note: I did not examine her breasts today   Cardiovascular Cardiovascular examination reveals -normal heart sounds, regular rate and rhythm with no murmurs and femoral artery auscultation bilaterally reveals normal pulses, no bruits, no thrills.  Abdomen Inspection Inspection of the abdomen reveals - No Hernias. Palpation/Percussion Palpation and Percussion of the abdomen reveal - Soft, Non Tender, No Rigidity (guarding), No hepatosplenomegaly and No Palpable abdominal masses. Note: I cannot palpate her liver.  The right upper quadrant is nontender. Lower midline scar noted.   Neurologic Neurologic evaluation reveals -alert and oriented x 3 with no impairment of recent or remote memory, normal attention span and ability to concentrate, normal sensation and normal coordination.  Musculoskeletal Normal Exam - Bilateral-Upper Extremity Strength Normal and Lower Extremity Strength Normal.    Assessment & Plan  CHRONIC CHOLECYSTITIS (K81.1)      You continue to have monthly attacks of right upper quadrant pain, bloating, nausea and sometimes vomiting This is very typical of gallbladder attacks Your nuclear medicine scan shows that the gallbladder does not function. This is consistent with pain originating in your gallbladder from chronic cholecystitis Dr. [name] says that sure liver disease is under good control, which is good news      You'll be scheduled for laparoscopic cholecystectomy with cholangiogram, possible open cholecystectomy We will do this at [facility] long hospital at your request I have discussed the indications, techniques, and risk of this surgery in detail with you and your son We are going to try to expedite this surgery We will usually wait a week or 2 after your Herceptin treatment to do the surgery.  BREAST CANCER METASTASIZED TO LIVER, UNSPECIFIED LATERALITY (C50.919) HISTORY OF LEFT BREAST CANCER (Z85.3) CIRRHOSIS, NONALCOHOLIC (A26.33) HISTORY OF THYROID CANCER (Z85.850) CHRONIC GERD (K21.9) DIABETES TYPE 2, CONTROLLED (E11.9) HYPERTENSION, BENIGN (I10)    Nickola Lenig M. Dalbert Batman, M.D., [facility] General and Minimally invasive Surgery Breast and Colorectal Surgery Office:   [phone] Pager:   [phone]

## 2018-03-24 ENCOUNTER — Ambulatory Visit (HOSPITAL_COMMUNITY): Payer: Medicare Other

## 2018-03-24 ENCOUNTER — Ambulatory Visit (HOSPITAL_COMMUNITY)
Admission: RE | Admit: 2018-03-24 | Discharge: 2018-03-24 | Disposition: A | Discharge status: Home / Self Care | Payer: Medicare Other | Source: Ambulatory Visit | Attending: General Surgery | Admitting: General Surgery

## 2018-03-24 ENCOUNTER — Ambulatory Visit (HOSPITAL_COMMUNITY): Payer: Medicare Other | Admitting: Anesthesiology

## 2018-03-24 ENCOUNTER — Encounter (HOSPITAL_COMMUNITY): Payer: Self-pay | Admitting: Emergency Medicine

## 2018-03-24 ENCOUNTER — Encounter (HOSPITAL_COMMUNITY)
Admission: RE | Disposition: A | Discharge status: Home / Self Care | Payer: Self-pay | Source: Ambulatory Visit | Attending: General Surgery

## 2018-03-24 DIAGNOSIS — Z923 Personal history of irradiation: Secondary | ICD-10-CM | POA: Insufficient documentation

## 2018-03-24 DIAGNOSIS — Z79899 Other long term (current) drug therapy: Secondary | ICD-10-CM | POA: Diagnosis not present

## 2018-03-24 DIAGNOSIS — Z7952 Long term (current) use of systemic steroids: Secondary | ICD-10-CM | POA: Insufficient documentation

## 2018-03-24 DIAGNOSIS — Z7984 Long term (current) use of oral hypoglycemic drugs: Secondary | ICD-10-CM | POA: Insufficient documentation

## 2018-03-24 DIAGNOSIS — Z853 Personal history of malignant neoplasm of breast: Secondary | ICD-10-CM | POA: Diagnosis not present

## 2018-03-24 DIAGNOSIS — C7889 Secondary malignant neoplasm of other digestive organs: Secondary | ICD-10-CM | POA: Insufficient documentation

## 2018-03-24 DIAGNOSIS — C787 Secondary malignant neoplasm of liver and intrahepatic bile duct: Secondary | ICD-10-CM | POA: Diagnosis not present

## 2018-03-24 DIAGNOSIS — E119 Type 2 diabetes mellitus without complications: Secondary | ICD-10-CM | POA: Insufficient documentation

## 2018-03-24 DIAGNOSIS — K7460 Unspecified cirrhosis of liver: Secondary | ICD-10-CM | POA: Insufficient documentation

## 2018-03-24 DIAGNOSIS — K219 Gastro-esophageal reflux disease without esophagitis: Secondary | ICD-10-CM | POA: Insufficient documentation

## 2018-03-24 DIAGNOSIS — K811 Chronic cholecystitis: Secondary | ICD-10-CM | POA: Diagnosis not present

## 2018-03-24 DIAGNOSIS — Z419 Encounter for procedure for purposes other than remedying health state, unspecified: Secondary | ICD-10-CM

## 2018-03-24 DIAGNOSIS — Z85850 Personal history of malignant neoplasm of thyroid: Secondary | ICD-10-CM | POA: Diagnosis not present

## 2018-03-24 DIAGNOSIS — K828 Other specified diseases of gallbladder: Secondary | ICD-10-CM | POA: Diagnosis not present

## 2018-03-24 DIAGNOSIS — I10 Essential (primary) hypertension: Secondary | ICD-10-CM | POA: Diagnosis not present

## 2018-03-24 DIAGNOSIS — Z79890 Hormone replacement therapy (postmenopausal): Secondary | ICD-10-CM | POA: Insufficient documentation

## 2018-03-24 DIAGNOSIS — C50919 Malignant neoplasm of unspecified site of unspecified female breast: Secondary | ICD-10-CM | POA: Diagnosis not present

## 2018-03-24 HISTORY — DX: Other specified diseases of gallbladder: K82.8

## 2018-03-24 HISTORY — PX: CHOLECYSTECTOMY: SHX55

## 2018-03-24 LAB — GLUCOSE, CAPILLARY: GLUCOSE-CAPILLARY: 228 mg/dL — AB (ref 70–99)

## 2018-03-24 SURGERY — LAPAROSCOPIC CHOLECYSTECTOMY WITH INTRAOPERATIVE CHOLANGIOGRAM
Anesthesia: General

## 2018-03-24 MED ORDER — ROCURONIUM BROMIDE 10 MG/ML (PF) SYRINGE
PREFILLED_SYRINGE | INTRAVENOUS | Status: DC | PRN
  Administered 2018-03-24: 40 mg via INTRAVENOUS

## 2018-03-24 MED ORDER — LACTATED RINGERS IV SOLN: INTRAVENOUS | Status: DC

## 2018-03-24 MED ORDER — OXYCODONE HCL 5 MG PO TABS: 5.0000 mg | ORAL_TABLET | ORAL | Status: DC | PRN

## 2018-03-24 MED ORDER — SODIUM CHLORIDE 0.9% FLUSH: 3.0000 mL | INTRAVENOUS | Status: DC | PRN

## 2018-03-24 MED ORDER — LIDOCAINE 2% (20 MG/ML) 5 ML SYRINGE
INTRAMUSCULAR | Status: AC
  Filled 2018-03-24: qty 5

## 2018-03-24 MED ORDER — ONDANSETRON HCL 4 MG PO TABS: 4.0000 mg | ORAL_TABLET | Freq: Every day | ORAL | 1 refills | Status: DC | PRN

## 2018-03-24 MED ORDER — EPHEDRINE 5 MG/ML INJ
INTRAVENOUS | Status: AC
  Filled 2018-03-24: qty 10

## 2018-03-24 MED ORDER — GLYCOPYRROLATE PF 0.2 MG/ML IJ SOSY
PREFILLED_SYRINGE | INTRAMUSCULAR | Status: DC | PRN
  Administered 2018-03-24: .2 mg via INTRAVENOUS

## 2018-03-24 MED ORDER — SUGAMMADEX SODIUM 500 MG/5ML IV SOLN
INTRAVENOUS | Status: AC
  Filled 2018-03-24: qty 5

## 2018-03-24 MED ORDER — PROPOFOL 10 MG/ML IV BOLUS
INTRAVENOUS | Status: DC | PRN
  Administered 2018-03-24: 150 mg via INTRAVENOUS

## 2018-03-24 MED ORDER — CHLORHEXIDINE GLUCONATE CLOTH 2 % EX PADS: 6.0000 | MEDICATED_PAD | Freq: Once | CUTANEOUS | Status: DC

## 2018-03-24 MED ORDER — ACETAMINOPHEN 325 MG PO TABS: 650.0000 mg | ORAL_TABLET | ORAL | Status: DC | PRN

## 2018-03-24 MED ORDER — ONDANSETRON HCL 4 MG/2ML IJ SOLN
INTRAMUSCULAR | Status: DC | PRN
  Administered 2018-03-24: 4 mg via INTRAVENOUS

## 2018-03-24 MED ORDER — MEPERIDINE HCL 50 MG/ML IJ SOLN: 6.2500 mg | INTRAMUSCULAR | Status: DC | PRN

## 2018-03-24 MED ORDER — HYDROMORPHONE HCL 1 MG/ML IJ SOLN: 0.2500 mg | INTRAMUSCULAR | Status: DC | PRN

## 2018-03-24 MED ORDER — FENTANYL CITRATE (PF) 100 MCG/2ML IJ SOLN
INTRAMUSCULAR | Status: AC
  Filled 2018-03-24: qty 2

## 2018-03-24 MED ORDER — CEFAZOLIN SODIUM-DEXTROSE 2-4 GM/100ML-% IV SOLN
INTRAVENOUS | Status: AC
  Filled 2018-03-24: qty 100

## 2018-03-24 MED ORDER — SUCCINYLCHOLINE CHLORIDE 200 MG/10ML IV SOSY
PREFILLED_SYRINGE | INTRAVENOUS | Status: AC
  Filled 2018-03-24: qty 10

## 2018-03-24 MED ORDER — FENTANYL CITRATE (PF) 100 MCG/2ML IJ SOLN
25.0000 ug | INTRAMUSCULAR | Status: DC | PRN
  Administered 2018-03-24: 50 ug via INTRAVENOUS

## 2018-03-24 MED ORDER — LACTATED RINGERS IR SOLN
Status: DC | PRN
  Administered 2018-03-24: 1000 mL

## 2018-03-24 MED ORDER — ONDANSETRON HCL 4 MG/2ML IJ SOLN
INTRAMUSCULAR | Status: AC
  Filled 2018-03-24: qty 2

## 2018-03-24 MED ORDER — DEXAMETHASONE SODIUM PHOSPHATE 10 MG/ML IJ SOLN
INTRAMUSCULAR | Status: DC | PRN
  Administered 2018-03-24: 4 mg via INTRAVENOUS

## 2018-03-24 MED ORDER — ROCURONIUM BROMIDE 10 MG/ML (PF) SYRINGE
PREFILLED_SYRINGE | INTRAVENOUS | Status: AC
  Filled 2018-03-24: qty 10

## 2018-03-24 MED ORDER — SODIUM CHLORIDE 0.9% FLUSH: 3.0000 mL | Freq: Two times a day (BID) | INTRAVENOUS | Status: DC

## 2018-03-24 MED ORDER — ACETAMINOPHEN 650 MG RE SUPP
650.0000 mg | RECTAL | Status: DC | PRN
  Filled 2018-03-24: qty 1

## 2018-03-24 MED ORDER — PHENYLEPHRINE 40 MCG/ML (10ML) SYRINGE FOR IV PUSH (FOR BLOOD PRESSURE SUPPORT)
PREFILLED_SYRINGE | INTRAVENOUS | Status: DC | PRN
  Administered 2018-03-24: 120 ug via INTRAVENOUS
  Administered 2018-03-24: 80 ug via INTRAVENOUS

## 2018-03-24 MED ORDER — GABAPENTIN 300 MG PO CAPS
300.0000 mg | ORAL_CAPSULE | ORAL | Status: AC
  Administered 2018-03-24: 300 mg via ORAL
  Filled 2018-03-24: qty 1

## 2018-03-24 MED ORDER — ACETAMINOPHEN 500 MG PO TABS
1000.0000 mg | ORAL_TABLET | ORAL | Status: AC
  Administered 2018-03-24: 1000 mg via ORAL
  Filled 2018-03-24: qty 2

## 2018-03-24 MED ORDER — FENTANYL CITRATE (PF) 100 MCG/2ML IJ SOLN
INTRAMUSCULAR | Status: DC | PRN
  Administered 2018-03-24: 100 ug via INTRAVENOUS

## 2018-03-24 MED ORDER — IOPAMIDOL (ISOVUE-300) INJECTION 61%
INTRAVENOUS | Status: AC
  Filled 2018-03-24: qty 50

## 2018-03-24 MED ORDER — GLYCOPYRROLATE PF 0.2 MG/ML IJ SOSY
PREFILLED_SYRINGE | INTRAMUSCULAR | Status: AC
  Filled 2018-03-24: qty 1

## 2018-03-24 MED ORDER — HYDROCODONE-ACETAMINOPHEN 5-325 MG PO TABS: 1.0000 | ORAL_TABLET | Freq: Four times a day (QID) | ORAL | 0 refills | Status: DC | PRN

## 2018-03-24 MED ORDER — PROPOFOL 10 MG/ML IV BOLUS
INTRAVENOUS | Status: AC
  Filled 2018-03-24: qty 20

## 2018-03-24 MED ORDER — SODIUM CHLORIDE 0.9 % IV SOLN: 250.0000 mL | INTRAVENOUS | Status: DC | PRN

## 2018-03-24 MED ORDER — ONDANSETRON HCL 4 MG/2ML IJ SOLN
4.0000 mg | Freq: Once | INTRAMUSCULAR | Status: AC | PRN
  Administered 2018-03-24: 4 mg via INTRAVENOUS

## 2018-03-24 MED ORDER — BUPIVACAINE-EPINEPHRINE 0.5% -1:200000 IJ SOLN
INTRAMUSCULAR | Status: DC | PRN
  Administered 2018-03-24: 24 mL

## 2018-03-24 MED ORDER — CELECOXIB 200 MG PO CAPS
200.0000 mg | ORAL_CAPSULE | ORAL | Status: AC
  Administered 2018-03-24: 200 mg via ORAL
  Filled 2018-03-24: qty 1

## 2018-03-24 MED ORDER — EPHEDRINE SULFATE-NACL 50-0.9 MG/10ML-% IV SOSY
PREFILLED_SYRINGE | INTRAVENOUS | Status: DC | PRN
  Administered 2018-03-24: 15 mg via INTRAVENOUS

## 2018-03-24 MED ORDER — BUPIVACAINE-EPINEPHRINE (PF) 0.5% -1:200000 IJ SOLN
INTRAMUSCULAR | Status: AC
  Filled 2018-03-24: qty 30

## 2018-03-24 MED ORDER — CEFAZOLIN SODIUM-DEXTROSE 2-4 GM/100ML-% IV SOLN
2.0000 g | INTRAVENOUS | Status: AC
  Administered 2018-03-24: 2 g via INTRAVENOUS
  Filled 2018-03-24: qty 100

## 2018-03-24 MED ORDER — SUGAMMADEX SODIUM 500 MG/5ML IV SOLN
INTRAVENOUS | Status: DC | PRN
  Administered 2018-03-24: 200 mg via INTRAVENOUS

## 2018-03-24 MED ORDER — PHENYLEPHRINE 40 MCG/ML (10ML) SYRINGE FOR IV PUSH (FOR BLOOD PRESSURE SUPPORT)
PREFILLED_SYRINGE | INTRAVENOUS | Status: AC
  Filled 2018-03-24: qty 10

## 2018-03-24 MED ORDER — LIDOCAINE 2% (20 MG/ML) 5 ML SYRINGE
INTRAMUSCULAR | Status: DC | PRN
  Administered 2018-03-24: 100 mg via INTRAVENOUS

## 2018-03-24 MED ORDER — DEXAMETHASONE SODIUM PHOSPHATE 10 MG/ML IJ SOLN
INTRAMUSCULAR | Status: AC
  Filled 2018-03-24: qty 1

## 2018-03-24 MED ORDER — LACTATED RINGERS IV SOLN
INTRAVENOUS | Status: DC
  Administered 2018-03-24 (×2): via INTRAVENOUS

## 2018-03-24 SURGICAL SUPPLY — 31 items
APPLIER CLIP ROT 10 11.4 M/L (STAPLE) ×3
CABLE HIGH FREQUENCY MONO STRZ (ELECTRODE) ×3 IMPLANT
CLIP APPLIE ROT 10 11.4 M/L (STAPLE) ×1 IMPLANT
COVER MAYO STAND STRL (DRAPES) ×3 IMPLANT
COVER SURGICAL LIGHT HANDLE (MISCELLANEOUS) ×3 IMPLANT
DECANTER SPIKE VIAL GLASS SM (MISCELLANEOUS) ×3 IMPLANT
DERMABOND ADVANCED (GAUZE/BANDAGES/DRESSINGS) ×2
DERMABOND ADVANCED .7 DNX12 (GAUZE/BANDAGES/DRESSINGS) ×1 IMPLANT
DRAPE C-ARM 42X120 X-RAY (DRAPES) ×3 IMPLANT
ELECT REM PT RETURN 15FT ADLT (MISCELLANEOUS) ×3 IMPLANT
GLOVE EUDERMIC 7 POWDERFREE (GLOVE) ×3 IMPLANT
GOWN STRL REUS W/TWL XL LVL3 (GOWN DISPOSABLE) ×12 IMPLANT
HEMOSTAT SNOW SURGICEL 2X4 (HEMOSTASIS) IMPLANT
KIT BASIN OR (CUSTOM PROCEDURE TRAY) ×3 IMPLANT
POSITIONER SURGICAL ARM (MISCELLANEOUS) IMPLANT
POUCH RETRIEVAL ECOSAC 10 (ENDOMECHANICALS) IMPLANT
POUCH RETRIEVAL ECOSAC 10MM (ENDOMECHANICALS)
POUCH SPECIMEN RETRIEVAL 10MM (ENDOMECHANICALS) IMPLANT
SCISSORS LAP 5X35 DISP (ENDOMECHANICALS) ×3 IMPLANT
SET CHOLANGIOGRAPH MIX (MISCELLANEOUS) ×3 IMPLANT
SET IRRIG TUBING LAPAROSCOPIC (IRRIGATION / IRRIGATOR) ×3 IMPLANT
SLEEVE XCEL OPT CAN 5 100 (ENDOMECHANICALS) ×3 IMPLANT
SUT MNCRL AB 4-0 PS2 18 (SUTURE) ×3 IMPLANT
TAPE CLOTH 4X10 WHT NS (GAUZE/BANDAGES/DRESSINGS) IMPLANT
TOWEL OR 17X26 10 PK STRL BLUE (TOWEL DISPOSABLE) ×3 IMPLANT
TOWEL OR NON WOVEN STRL DISP B (DISPOSABLE) ×3 IMPLANT
TRAY LAPAROSCOPIC (CUSTOM PROCEDURE TRAY) ×3 IMPLANT
TROCAR BLADELESS OPT 5 100 (ENDOMECHANICALS) ×3 IMPLANT
TROCAR XCEL BLUNT TIP 100MML (ENDOMECHANICALS) ×3 IMPLANT
TROCAR XCEL NON-BLD 11X100MML (ENDOMECHANICALS) ×3 IMPLANT
TUBING INSUF HEATED (TUBING) ×3 IMPLANT

## 2018-03-24 NOTE — Anesthesia Procedure Notes (Signed)
Procedure Name: Intubation Date/Time: 03/24/2018 8:01 AM Performed by: Lind Covert, CRNA Pre-anesthesia Checklist: Patient identified, Emergency Drugs available, Suction available, Patient being monitored and Timeout performed Patient Re-evaluated:Patient Re-evaluated prior to induction Oxygen Delivery Method: Circle system utilized Preoxygenation: Pre-oxygenation with 100% oxygen Induction Type: IV induction Ventilation: Mask ventilation without difficulty Laryngoscope Size: 3 Grade View: Grade I Tube type: Oral Tube size: 7.0 mm Number of attempts: 1 (intubated per Winferd Humphrey EMT) Airway Equipment and Method: Stylet Placement Confirmation: ETT inserted through vocal cords under direct vision and positive ETCO2 Secured at: 21 cm Tube secured with: Tape Dental Injury: Teeth and Oropharynx as per pre-operative assessment

## 2018-03-24 NOTE — Progress Notes (Signed)
Pharmacy notified to come down to add medication to medication list.

## 2018-03-24 NOTE — Interval H&P Note (Signed)
History and Physical Interval Note:  03/24/2018 5:54 AM  Monique Figueroa  has presented today for surgery, with the diagnosis of CHOLECYSTITIS  The various methods of treatment have been discussed with the patient and family. After consideration of risks, benefits and other options for treatment, the patient has consented to  Procedure(s): LAPAROSCOPIC CHOLECYSTECTOMY WITH INTRAOPERATIVE CHOLANGIOGRAM (N/A) as a surgical intervention .  The patient's history has been reviewed, patient examined, no change in status, stable for surgery.  I have reviewed the patient's chart and labs.  Questions were answered to the patient's satisfaction.     Adin Hector

## 2018-03-24 NOTE — Anesthesia Postprocedure Evaluation (Signed)
Anesthesia Post Note  Patient: Monique Figueroa  Procedure(s) Performed: DIAGNOSTIC LAPAROSCOPY (N/A )     Patient location during evaluation: PACU Anesthesia Type: General Level of consciousness: awake and alert Pain management: pain level controlled Vital Signs Assessment: post-procedure vital signs reviewed and stable Respiratory status: spontaneous breathing, nonlabored ventilation, respiratory function stable and patient connected to nasal cannula oxygen Cardiovascular status: blood pressure returned to baseline and stable Postop Assessment: no apparent nausea or vomiting Anesthetic complications: no    Last Vitals:  Vitals:   03/24/18 0930 03/24/18 0945  BP: (!) 148/80 (!) 145/75  Pulse: 96 95  Resp: (!) 31 14  Temp:  36.4 C  SpO2: 99% 100%    Last Pain:  Vitals:   03/24/18 0915  TempSrc:   PainSc: Asleep                 Joachim Carton DAVID

## 2018-03-24 NOTE — Anesthesia Preprocedure Evaluation (Signed)
Anesthesia Evaluation  Patient identified by MRN, date of birth, ID band Patient awake    Reviewed: Allergy & Precautions, NPO status , Patient's Chart, lab work & pertinent test results  History of Anesthesia Complications (+) PONV  Airway Mallampati: I  TM Distance: >3 FB Neck ROM: Full    Dental   Pulmonary    Pulmonary exam normal        Cardiovascular hypertension, Pt. on medications Normal cardiovascular exam     Neuro/Psych    GI/Hepatic GERD  Medicated and Controlled,  Endo/Other  diabetes, Type 2, Oral Hypoglycemic Agents  Renal/GU      Musculoskeletal   Abdominal   Peds  Hematology   Anesthesia Other Findings   Reproductive/Obstetrics                             Anesthesia Physical Anesthesia Plan  ASA: II  Anesthesia Plan: General   Post-op Pain Management:    Induction: Intravenous  PONV Risk Score and Plan: 3 and Ondansetron and Treatment may vary due to age or medical condition  Airway Management Planned: Oral ETT  Additional Equipment:   Intra-op Plan:   Post-operative Plan: Extubation in OR  Informed Consent: I have reviewed the patients History and Physical, chart, labs and discussed the procedure including the risks, benefits and alternatives for the proposed anesthesia with the patient or authorized representative who has indicated his/her understanding and acceptance.     Plan Discussed with: CRNA and Surgeon  Anesthesia Plan Comments:         Anesthesia Quick Evaluation

## 2018-03-24 NOTE — Discharge Instructions (Signed)
[facility] ______[facility] °LAPAROSCOPIC SURGERY: POST OP INSTRUCTIONS °Always review your discharge instruction sheet given to you by the facility where your surgery was performed. °IF YOU HAVE DISABILITY OR FAMILY LEAVE FORMS, YOU MUST BRING THEM TO THE OFFICE FOR PROCESSING.   °DO NOT GIVE THEM TO YOUR DOCTOR. ° °1. A prescription for pain medication may be given to you upon discharge.  Take your pain medication as prescribed, if needed.  If narcotic pain medicine is not needed, then you may take acetaminophen (Tylenol) or ibuprofen (Advil) as needed. °2. Take your usually prescribed medications unless otherwise directed. °3. If you need a refill on your pain medication, please contact your pharmacy.  They will contact our office to request authorization. Prescriptions will not be filled after 5pm or on week-ends. °4. You should follow a light diet the first few days after arrival home, such as soup and crackers, etc.  Be sure to include lots of fluids daily. °5. Most patients will experience some swelling and bruising in the area of the incisions.  Ice packs will help.  Swelling and bruising can take several days to resolve.  °6. It is common to experience some constipation if taking pain medication after surgery.  Increasing fluid intake and taking a stool softener (such as Colace) will usually help or prevent this problem from occurring.  A mild laxative (Milk of Magnesia or Miralax) should be taken according to package instructions if there are no bowel movements after 48 hours. °7. Unless discharge instructions indicate otherwise, you may remove your bandages 24-48 hours after surgery, and you may shower at that time.  You may have steri-strips (small skin tapes) in place directly over the incision.  These strips should be left on the skin for 7-10 days.  If your surgeon used skin glue on the incision, you may shower in 24 hours.  The glue will flake off over the next 2-3 weeks.  Any sutures or  staples will be removed at the office during your follow-up visit. °8. ACTIVITIES:  You may resume regular (light) daily activities beginning the next day--such as daily self-care, walking, climbing stairs--gradually increasing activities as tolerated.  You may have sexual intercourse when it is comfortable.  Refrain from any heavy lifting or straining until approved by your doctor. °a. You may drive when you are no longer taking prescription pain medication, you can comfortably wear a seatbelt, and you can safely maneuver your car and apply brakes. °b. RETURN TO WORK:  __________________________________________________________ °9. You should see your doctor in the office for a follow-up appointment approximately 2-3 weeks after your surgery.  Make sure that you call for this appointment within a day or two after you arrive home to insure a convenient appointment time. °10. OTHER INSTRUCTIONS: __________________________________________________________________________________________________________________________ __________________________________________________________________________________________________________________________ °WHEN TO CALL YOUR DOCTOR: °1. Fever over 101.0 °2. Inability to urinate °3. Continued bleeding from incision. °4. Increased pain, redness, or drainage from the incision. °5. Increasing abdominal pain ° °The clinic staff is available to answer your questions during regular business hours.  Please don’t hesitate to call and ask to speak to one of the nurses for clinical concerns.  If you have a medical emergency, go to the nearest emergency room or call 911.  A surgeon from [facility] is always on call at the hospital. °[address], [address], [place], [place]  [address] ? [address] [address], [place], [place]   [address] °[phone] ? [phone] ? FAX [phone] °Web site:   [email or web address] °

## 2018-03-24 NOTE — Transfer of Care (Signed)
Immediate Anesthesia Transfer of Care Note  Patient: Raoul Pitch  Procedure(s) Performed: DIAGNOSTIC LAPAROSCOPY (N/A )  Patient Location: PACU  Anesthesia Type:General  Level of Consciousness: sedated  Airway & Oxygen Therapy: Patient Spontanous Breathing and Patient connected to face mask oxygen  Post-op Assessment: Report given to RN and Post -op Vital signs reviewed and stable  Post vital signs: Reviewed and stable  Last Vitals:  Vitals Value Taken Time  BP 149/82 03/24/2018  8:52 AM  Temp    Pulse    Resp 15 03/24/2018  8:53 AM  SpO2    Vitals shown include unvalidated device data.  Last Pain:  Vitals:   03/24/18 0652  TempSrc:   PainSc: 0-No pain      Patients Stated Pain Goal: 4 (12/52/47 9980)  Complications: No apparent anesthesia complications

## 2018-03-24 NOTE — Op Note (Addendum)
Patient Name:           Monique Figueroa   Date of Surgery:        03/24/2018  Pre op Diagnosis:      Chronic cholecystitis                                       Breast cancer metastatic to liver                                       Nonalcoholic cirrhosis                                         Post op Diagnosis:    Breast cancer metastatic to liver with extensive involvement of gallbladder  Procedure:                 Diagnostic laparoscopy  Surgeon:                     Edsel Petrin. Dalbert Batman, M.D., FACS  Assistant:                    Armandina Gemma, MD   Indication for Assistant:   Complex anatomy.  Complex decision-making  Operative indications:  This is an 84 year old female who returns with continued gallbladder symptoms to attempt elective cholecystectomy. She also has stage IV breast cancer with liver metastasis but apparently they're under good control from a medical oncology standpoint.. She gets Herceptin chemotherapy every few weeks and is tolerating that well. Her performance status is very good. Monique Figueroa is her PCP. Her son is with her throughout the encounter.      On June 19, 2006 Dr. Lennie Hummer performed left lumpectomy for a stage I cancer with negative margins and positive receptors and HER-2 positive. She received radiation therapy and Arimidex for 5 years. She has some vague abdominal discomfort for 2 years. Baseline October 2017 she had CT scans and a PET scan which showed multiple liver metastasis and positive left axillary supraclavicular and right costophrenic angle and upper abdominal lymph nodes. Liver biopsy was positive for metastatic carcinoma. ER week. HER-2 positive. We put a Port-A-Cath and it she's been stable on Herceptin chemotherapy ever since.      She is now developed intermittent attacks of biliary colic. She describes this as every 3 or 4 weeks. Right upper quadrant. She has bloating and burping and nausea and some bilious vomiting. This last  for a few hours and then goes away. Her ultrasound shows sludge but no wall thickening. CBD is chronically dilated at 9.6 mm. Liver function tests have been normal. CT scan on September 25, 2007 2019 shows liver lesions and cirrhosis and a small umbilical hernia. More recent imaging shows that the liver metastasis under good control. We went ahead with hepatobiliary scan which showed nonvisualization of her gallbladder She continues to have intermittent attacks and wants her gallbladder removed Dr. [name] says that she would be a good candidate at this time due to control of the metastasis in the liver. On imaging studies I do not see any obvious involvement of the gallbladder by tumor. I think she is having gallbladder attacks She will  be scheduled for laparoscopic cholecystectomy with cholangiogram, possible open cholecystectomy told her and her son that this might be straightforward or it could be complicated if the liver metastasis interfered with the surgical technique. Possibility of partial cholecystectomy was discussed. Increased risk for complications. Not prohibitive, however I discussed the indications, details, techniques, and numerous risk of surgery with her and her son. We went over the patient information booklet and I gave her a copy as well.   Operative Findings:       The gallbladder and porta hepatis were encased in cancer.  The gallbladder was rockhard and not distensible.  There was tumor palpable in the porta hepatis and in the fatty tissue overlying the portal triad.  There are extensive lucent liver lesions.  The liver was somewhat large and very dense and heavy.  There were no peritoneal implants.      Dr. [name] and I discussed the options and felt that the best outcome and quality of life  would be to leave the gallbladder in situ.  Any attempt at complete or incomplete cholecystectomy or drainage would have a high likelihood of complications, bleeding, common duct  injury, or chronic biliary drainage.  If she develops acute cholecystitis percutaneous cholecystostomy could be considered.  Every surgical option that we could contemplate had a high likelihood of adverse outcome and adverse effect on her quality of life.  Therefore only diagnostic laparoscopy was performed.  Photographs were taken and will be given to her oncologist  Procedure in Detail:          Following the induction of general endotracheal anesthesia the patient's abdomen was prepped and draped in sterile fashion.  Intravenous antibiotics were given.  Surgical timeout was performed.  0.5% Marcaine with epinephrine was used as local infiltration anesthetic.  A vertical incision was made in the lower rim of the umbilicus.  The fascia was incised in the midline.  The abdominal cavity was entered under direct vision.  An 11 mm Hassan trocar was inserted and secured the pursestring suture of 0 Vicryl.  Video camera was inserted after achieving pneumoperitoneum.  11 mm trocar placed in the subxiphoid region and two 5 mm trochars placed in the right upper quadrant.  We surveyed the entire abdomen and pelvis.  Extensive liver lesions were noted as above.  The gallbladder projected laterally.  There is extensive tumor just lateral to the gallbladder.  There is extensive tumor encasing the gallbladder from the fundus all the way down to the portal triad.  After lengthy discussion and contemplation of different surgical options we chose to abandon cholecystectomy and felt that medical management her symptoms would probably lead to the best out, and quality of life.       After taking photographs the pneumoperitoneum was released.  The fascia at the umbilicus was closed with 0 Vicryl sutures.  The skin incisions were closed with subcuticular 4-0 Monocryl and Dermabond.  Patient tolerated the procedure well was taken to PACU in stable condition.  EBL 20 cc.  Counts correct.  Complications none.    Addendum: I logged  onto the [employer] and reviewed her prescription medication history     Tammela Bales M. Dalbert Batman, M.D., FACS General and Minimally Invasive Surgery Breast and Colorectal Surgery  03/24/2018 9:02 AM

## 2018-03-26 ENCOUNTER — Other Ambulatory Visit: Payer: Self-pay

## 2018-03-26 ENCOUNTER — Inpatient Hospital Stay: Payer: Medicare Other

## 2018-03-26 ENCOUNTER — Encounter (HOSPITAL_COMMUNITY): Payer: Self-pay | Admitting: General Surgery

## 2018-03-26 VITALS — BP 140/75 | HR 86 | Temp 98.2°F | Resp 16

## 2018-03-26 DIAGNOSIS — C50919 Malignant neoplasm of unspecified site of unspecified female breast: Secondary | ICD-10-CM

## 2018-03-26 DIAGNOSIS — Z170 Estrogen receptor positive status [ER+]: Secondary | ICD-10-CM | POA: Diagnosis not present

## 2018-03-26 DIAGNOSIS — C50912 Malignant neoplasm of unspecified site of left female breast: Secondary | ICD-10-CM | POA: Diagnosis not present

## 2018-03-26 DIAGNOSIS — Z5112 Encounter for antineoplastic immunotherapy: Secondary | ICD-10-CM | POA: Diagnosis not present

## 2018-03-26 MED ORDER — ACETAMINOPHEN 325 MG PO TABS
650.0000 mg | ORAL_TABLET | Freq: Once | ORAL | Status: AC
  Administered 2018-03-26: 650 mg via ORAL

## 2018-03-26 MED ORDER — SODIUM CHLORIDE 0.9% FLUSH
10.0000 mL | INTRAVENOUS | Status: DC | PRN
  Administered 2018-03-26: 10 mL
  Filled 2018-03-26: qty 10

## 2018-03-26 MED ORDER — DIPHENHYDRAMINE HCL 25 MG PO CAPS
50.0000 mg | ORAL_CAPSULE | Freq: Once | ORAL | Status: AC
  Administered 2018-03-26: 50 mg via ORAL

## 2018-03-26 MED ORDER — ACETAMINOPHEN 325 MG PO TABS
ORAL_TABLET | ORAL | Status: AC
  Filled 2018-03-26: qty 2

## 2018-03-26 MED ORDER — TRASTUZUMAB CHEMO 150 MG IV SOLR
450.0000 mg | Freq: Once | INTRAVENOUS | Status: AC
  Administered 2018-03-26: 450 mg via INTRAVENOUS
  Filled 2018-03-26: qty 21.43

## 2018-03-26 MED ORDER — DIPHENHYDRAMINE HCL 25 MG PO CAPS
ORAL_CAPSULE | ORAL | Status: AC
  Filled 2018-03-26: qty 2

## 2018-03-26 MED ORDER — PROCHLORPERAZINE MALEATE 10 MG PO TABS: 10.0000 mg | ORAL_TABLET | Freq: Four times a day (QID) | ORAL | 1 refills | Status: DC | PRN

## 2018-03-26 MED ORDER — SODIUM CHLORIDE 0.9 % IV SOLN
Freq: Once | INTRAVENOUS | Status: AC
  Administered 2018-03-26: 10:00:00 via INTRAVENOUS
  Filled 2018-03-26: qty 250

## 2018-03-26 MED ORDER — HEPARIN SOD (PORK) LOCK FLUSH 100 UNIT/ML IV SOLN
500.0000 [IU] | Freq: Once | INTRAVENOUS | Status: AC | PRN
  Administered 2018-03-26: 500 [IU]
  Filled 2018-03-26: qty 5

## 2018-03-26 NOTE — Progress Notes (Signed)
Patient did not have gallbladder removed yesterday. Patient and son report surgery reviled cancerous tumors in gallbladder and surrounding. Surgery was aborted. Patient reports increased nausea since anesthesia. She took zofran at 75am

## 2018-03-26 NOTE — Patient Instructions (Signed)
[facility] Discharge Instructions for Patients Receiving Chemotherapy  Today you received the following chemotherapy agents Herceptin  To help prevent nausea and vomiting after your treatment, we encourage you to take your nausea medication as directed   If you develop nausea and vomiting that is not controlled by your nausea medication, call the clinic.   BELOW ARE SYMPTOMS THAT SHOULD BE REPORTED IMMEDIATELY:  *FEVER GREATER THAN 100.5 F  *CHILLS WITH OR WITHOUT FEVER  NAUSEA AND VOMITING THAT IS NOT CONTROLLED WITH YOUR NAUSEA MEDICATION  *UNUSUAL SHORTNESS OF BREATH  *UNUSUAL BRUISING OR BLEEDING  TENDERNESS IN MOUTH AND THROAT WITH OR WITHOUT PRESENCE OF ULCERS  *URINARY PROBLEMS  *BOWEL PROBLEMS  UNUSUAL RASH Items with * indicate a potential emergency and should be followed up as soon as possible.  Feel free to call the clinic should you have any questions or concerns. The clinic phone number is [phone].  Please show the CHEMO ALERT CARD at check-in to the Emergency Department and triage nurse.   

## 2018-03-26 NOTE — Progress Notes (Signed)
Last ECHO 12/24/17, confirmed w/ Dr. [name] okay to treat today and he will get her scheduled for next ECHO.   Demetrius Charity, PharmD Oncology Pharmacist  Pharmacy Phone: [phone] 03/26/2018

## 2018-04-09 ENCOUNTER — Other Ambulatory Visit: Payer: Self-pay | Admitting: General Surgery

## 2018-04-09 DIAGNOSIS — C787 Secondary malignant neoplasm of liver and intrahepatic bile duct: Principal | ICD-10-CM

## 2018-04-09 DIAGNOSIS — C50919 Malignant neoplasm of unspecified site of unspecified female breast: Secondary | ICD-10-CM

## 2018-04-10 ENCOUNTER — Ambulatory Visit
Admission: RE | Admit: 2018-04-10 | Discharge: 2018-04-10 | Disposition: A | Discharge status: Home / Self Care | Payer: Medicare Other | Source: Ambulatory Visit | Attending: General Surgery | Admitting: General Surgery

## 2018-04-10 DIAGNOSIS — C787 Secondary malignant neoplasm of liver and intrahepatic bile duct: Principal | ICD-10-CM

## 2018-04-10 DIAGNOSIS — R109 Unspecified abdominal pain: Secondary | ICD-10-CM | POA: Diagnosis not present

## 2018-04-10 DIAGNOSIS — C50919 Malignant neoplasm of unspecified site of unspecified female breast: Secondary | ICD-10-CM

## 2018-04-10 MED ORDER — IOPAMIDOL (ISOVUE-300) INJECTION 61%
100.0000 mL | Freq: Once | INTRAVENOUS | Status: AC | PRN
  Administered 2018-04-10: 100 mL via INTRAVENOUS

## 2018-04-13 ENCOUNTER — Telehealth: Payer: Self-pay | Admitting: Gastroenterology

## 2018-04-13 ENCOUNTER — Telehealth: Payer: Self-pay

## 2018-04-13 MED ORDER — PROCHLORPERAZINE 25 MG RE SUPP: 25.0000 mg | Freq: Two times a day (BID) | RECTAL | 0 refills | Status: DC | PRN

## 2018-04-13 NOTE — Telephone Encounter (Signed)
Patient with nausea and vomiting per daughter. Daughter stated she is able to tolerate small amount of food and liquids.  Ok per Dr. [name] for compazine suppositories.  Patient's daughter aware and voiced agreement.  Encouraged to continue with bland foods, and liquids.  If nausea vomiting gets worse or is not relieved with suppositories they will contact office.  No further needs at this time.

## 2018-04-14 ENCOUNTER — Telehealth: Payer: Self-pay

## 2018-04-14 ENCOUNTER — Other Ambulatory Visit: Payer: Self-pay

## 2018-04-14 DIAGNOSIS — K838 Other specified diseases of biliary tract: Secondary | ICD-10-CM

## 2018-04-14 NOTE — Telephone Encounter (Signed)
-----  Message from Gabriel Mansouraty Jr., MD sent at 04/14/2018  8:08 AM EDT ----- Regarding: Patient for urgent ERCP add-on Dear Patty, I hope you are well. Dan heard about this patient and we need to get in for an urgent ERCP for decompression.  "Dr. Haywood Ingram from [facility].  This woman has known metastatic breast cancer to the liver.  He recently did an attempted gallbladder resection 3 or 4 weeks ago but found her gallbladder was basically replaced by cancer as well.  In the past week or so she has developed jaundice.  Outside labs show a total bilirubin is now 3.8 and alk phos in the 600s.  Her common duct looks fine but then it looks like the duct is lost probably at the level of the gallbladder up until biliary right and left bifurcation.  Then she has fairly prominent bilateral intrahepatic duct dilation."  Looking at my schedule for next week, I'd like to see if we can do the following to try and accommodate this urgent procedure:  1) First case which is the duodenal EMR, let's try and reschedule that for a few weeks from now 2) Direct Colon, Dan thought he could add this on to his schedule later in the week 3) Place this patient for an ERCP on Monday at 730 slot  I think this will allow us to get her in and try and help her sooner rather than later. Dan, as we spoke earlier, if you are OK with this, we can add her on Monday. Thank you Patty for working on this.  Gabe 

## 2018-04-14 NOTE — Telephone Encounter (Signed)
-----  Message from Gabriel Mansouraty Jr., MD sent at 04/14/2018  8:08 AM EDT ----- Regarding: Patient for urgent ERCP add-on Dear Maxine Fredman, I hope you are well. Dan heard about this patient and we need to get in for an urgent ERCP for decompression.  "Dr. Haywood Ingram from [facility].  This woman has known metastatic breast cancer to the liver.  He recently did an attempted gallbladder resection 3 or 4 weeks ago but found her gallbladder was basically replaced by cancer as well.  In the past week or so she has developed jaundice.  Outside labs show a total bilirubin is now 3.8 and alk phos in the 600s.  Her common duct looks fine but then it looks like the duct is lost probably at the level of the gallbladder up until biliary right and left bifurcation.  Then she has fairly prominent bilateral intrahepatic duct dilation."  Looking at my schedule for next week, I'd like to see if we can do the following to try and accommodate this urgent procedure:  1) First case which is the duodenal EMR, let's try and reschedule that for a few weeks from now 2) Direct Colon, Dan thought he could add this on to his schedule later in the week 3) Place this patient for an ERCP on Monday at 730 slot  I think this will allow us to get her in and try and help her sooner rather than later. Dan, as we spoke earlier, if you are OK with this, we can add her on Monday. Thank you Tawan Corkern for working on this.  Gabe 

## 2018-04-14 NOTE — Telephone Encounter (Signed)
04/20/18 [facility] ERCP 730 am Left message on machine to call back

## 2018-04-14 NOTE — Telephone Encounter (Signed)
ERCP scheduled, pt instructed and medications reviewed.  Patient instructions mailed to home.  Patient to call with any questions or concerns.  

## 2018-04-15 ENCOUNTER — Other Ambulatory Visit: Payer: Self-pay

## 2018-04-15 DIAGNOSIS — C50919 Malignant neoplasm of unspecified site of unspecified female breast: Secondary | ICD-10-CM

## 2018-04-16 ENCOUNTER — Inpatient Hospital Stay: Payer: Medicare Other | Admitting: Hematology and Oncology

## 2018-04-16 ENCOUNTER — Other Ambulatory Visit: Payer: Self-pay

## 2018-04-16 ENCOUNTER — Inpatient Hospital Stay: Payer: Medicare Other

## 2018-04-16 ENCOUNTER — Inpatient Hospital Stay: Payer: Medicare Other | Attending: Hematology and Oncology

## 2018-04-16 DIAGNOSIS — R5383 Other fatigue: Secondary | ICD-10-CM

## 2018-04-16 DIAGNOSIS — C778 Secondary and unspecified malignant neoplasm of lymph nodes of multiple regions: Secondary | ICD-10-CM | POA: Diagnosis not present

## 2018-04-16 DIAGNOSIS — R531 Weakness: Secondary | ICD-10-CM | POA: Diagnosis not present

## 2018-04-16 DIAGNOSIS — Z79899 Other long term (current) drug therapy: Secondary | ICD-10-CM | POA: Diagnosis not present

## 2018-04-16 DIAGNOSIS — C787 Secondary malignant neoplasm of liver and intrahepatic bile duct: Secondary | ICD-10-CM

## 2018-04-16 DIAGNOSIS — C50912 Malignant neoplasm of unspecified site of left female breast: Secondary | ICD-10-CM | POA: Insufficient documentation

## 2018-04-16 DIAGNOSIS — C50919 Malignant neoplasm of unspecified site of unspecified female breast: Secondary | ICD-10-CM

## 2018-04-16 DIAGNOSIS — K811 Chronic cholecystitis: Secondary | ICD-10-CM

## 2018-04-16 DIAGNOSIS — C7889 Secondary malignant neoplasm of other digestive organs: Secondary | ICD-10-CM

## 2018-04-16 DIAGNOSIS — Z7984 Long term (current) use of oral hypoglycemic drugs: Secondary | ICD-10-CM | POA: Insufficient documentation

## 2018-04-16 DIAGNOSIS — Z5181 Encounter for therapeutic drug level monitoring: Secondary | ICD-10-CM

## 2018-04-16 DIAGNOSIS — R110 Nausea: Secondary | ICD-10-CM

## 2018-04-16 DIAGNOSIS — Z170 Estrogen receptor positive status [ER+]: Secondary | ICD-10-CM | POA: Insufficient documentation

## 2018-04-16 DIAGNOSIS — Z923 Personal history of irradiation: Secondary | ICD-10-CM | POA: Insufficient documentation

## 2018-04-16 DIAGNOSIS — Z95828 Presence of other vascular implants and grafts: Secondary | ICD-10-CM

## 2018-04-16 DIAGNOSIS — K831 Obstruction of bile duct: Secondary | ICD-10-CM | POA: Insufficient documentation

## 2018-04-16 DIAGNOSIS — Z9221 Personal history of antineoplastic chemotherapy: Secondary | ICD-10-CM | POA: Diagnosis not present

## 2018-04-16 LAB — CBC WITH DIFFERENTIAL (CANCER CENTER ONLY)
BASOS ABS: 0 10*3/uL (ref 0.0–0.1)
Basophils Relative: 1 %
EOS PCT: 2 %
Eosinophils Absolute: 0.1 10*3/uL (ref 0.0–0.5)
HEMATOCRIT: 32.5 % — AB (ref 34.8–46.6)
Hemoglobin: 11.2 g/dL — ABNORMAL LOW (ref 11.6–15.9)
LYMPHS ABS: 0.4 10*3/uL — AB (ref 0.9–3.3)
LYMPHS PCT: 11 %
MCH: 33.6 pg (ref 25.1–34.0)
MCHC: 34.6 g/dL (ref 31.5–36.0)
MCV: 97.2 fL (ref 79.5–101.0)
MONO ABS: 0.4 10*3/uL (ref 0.1–0.9)
MONOS PCT: 11 %
NEUTROS ABS: 3 10*3/uL (ref 1.5–6.5)
Neutrophils Relative %: 75 %
PLATELETS: 220 10*3/uL (ref 145–400)
RBC: 3.35 MIL/uL — ABNORMAL LOW (ref 3.70–5.45)
RDW: 15.3 % — AB (ref 11.2–14.5)
WBC Count: 4 10*3/uL (ref 3.9–10.3)

## 2018-04-16 LAB — CMP (CANCER CENTER ONLY)
ALT: 333 U/L (ref 0–44)
AST: 357 U/L — AB (ref 15–41)
Albumin: 3.3 g/dL — ABNORMAL LOW (ref 3.5–5.0)
Alkaline Phosphatase: 827 U/L — ABNORMAL HIGH (ref 38–126)
Anion gap: 12 (ref 5–15)
BILIRUBIN TOTAL: 10.8 mg/dL — AB (ref 0.3–1.2)
BUN: 11 mg/dL (ref 8–23)
CHLORIDE: 97 mmol/L — AB (ref 98–111)
CO2: 27 mmol/L (ref 22–32)
Calcium: 9.7 mg/dL (ref 8.9–10.3)
Creatinine: 0.79 mg/dL (ref 0.44–1.00)
Glucose, Bld: 155 mg/dL — ABNORMAL HIGH (ref 70–99)
POTASSIUM: 3.2 mmol/L — AB (ref 3.5–5.1)
Sodium: 136 mmol/L (ref 135–145)
TOTAL PROTEIN: 7.2 g/dL (ref 6.5–8.1)

## 2018-04-16 MED ORDER — HEPARIN SOD (PORK) LOCK FLUSH 100 UNIT/ML IV SOLN
500.0000 [IU] | Freq: Once | INTRAVENOUS | Status: AC | PRN
  Administered 2018-04-16: 500 [IU]
  Filled 2018-04-16: qty 5

## 2018-04-16 MED ORDER — TRASTUZUMAB CHEMO 150 MG IV SOLR
450.0000 mg | Freq: Once | INTRAVENOUS | Status: AC
  Administered 2018-04-16: 450 mg via INTRAVENOUS
  Filled 2018-04-16: qty 21.43

## 2018-04-16 MED ORDER — DIPHENHYDRAMINE HCL 25 MG PO CAPS
ORAL_CAPSULE | ORAL | Status: AC
  Filled 2018-04-16: qty 2

## 2018-04-16 MED ORDER — ONDANSETRON 8 MG PO TBDP: 8.0000 mg | ORAL_TABLET | Freq: Three times a day (TID) | ORAL | 3 refills | Status: DC | PRN

## 2018-04-16 MED ORDER — SODIUM CHLORIDE 0.9 % IV SOLN
Freq: Once | INTRAVENOUS | Status: AC
  Administered 2018-04-16: 12:00:00 via INTRAVENOUS
  Filled 2018-04-16: qty 250

## 2018-04-16 MED ORDER — ACETAMINOPHEN 325 MG PO TABS
ORAL_TABLET | ORAL | Status: AC
  Filled 2018-04-16: qty 2

## 2018-04-16 MED ORDER — SODIUM CHLORIDE 0.9% FLUSH
10.0000 mL | INTRAVENOUS | Status: DC | PRN
  Administered 2018-04-16: 10 mL
  Filled 2018-04-16: qty 10

## 2018-04-16 MED ORDER — DIPHENHYDRAMINE HCL 25 MG PO CAPS
50.0000 mg | ORAL_CAPSULE | Freq: Once | ORAL | Status: AC
  Administered 2018-04-16: 50 mg via ORAL

## 2018-04-16 MED ORDER — SODIUM CHLORIDE 0.9% FLUSH
10.0000 mL | INTRAVENOUS | Status: DC | PRN
  Administered 2018-04-16: 10 mL via INTRAVENOUS
  Filled 2018-04-16: qty 10

## 2018-04-16 MED ORDER — ACETAMINOPHEN 325 MG PO TABS
650.0000 mg | ORAL_TABLET | Freq: Once | ORAL | Status: AC
  Administered 2018-04-16: 650 mg via ORAL

## 2018-04-16 MED ORDER — ONDANSETRON HCL 4 MG PO TABS: 4.0000 mg | ORAL_TABLET | Freq: Every day | ORAL | 3 refills | Status: DC | PRN

## 2018-04-16 NOTE — Assessment & Plan Note (Signed)
Metastatic breast cancer with innumerable liver metastases detected on ultrasound and recent MRI of the liver on 04/26/2016 (Prior history of left breast IDC T1 1 N0 stage IA 0.3 cm grade 1 tumor that was ER 38%, PR 93%, Ki-67 10%, HER-2 2+, no tissue for FISH testing, 0/1 lymph node negative, status post lumpectomy radiation and 5 years of Arimidex completed in February 2013)  PET/CT scan: 05/07/2016: Extensive metastatic involvement of both lobes of the liver, primary could be liver, phalangeal or metastatic disease, hypermetabolic thoracic lymph nodes left axillary, left supraclavicular and right CP angle lymph nodes, hypermetabolic upper abdominal lymph nodes  Liver biopsy10/07/2016: Metastatic carcinoma breast primary strong positivity for CK 7 weak focal positivity for ER, GCDFP, negative for PR, CDX 2, TTF-1, WT 1; HER-2 positive  Treatment plan: 1. Taxotere Herceptin and Perjeta every 3 weeks palliative chemotherapy started 06/13/16 (Perjeta discontinued after 5 cycles due to allergy) 2. followed by Herceptin maintenance  ----------------------------------------------------------------------------------------------------------------------------------------- Current Treatment:Herceptin maintenance Peripheral neuropathy: Not bothering her, Blindness that was transient: on81 mg aspirin daily  Chronic cholecystitis: 03/24/2018 diagnostic laparoscopy: Due to intermittent attacks of biliary colic: The gallbladder was rockhard and not distensible with tumor palpated in the porta hepatis, extensive lucent liver lesions, decision was made to stop the surgery.  Patient is scheduled to undergo ERCP and a gastroenterology follow-up.  This will help facilitate if there is a possibility of stenting.  Otherwise she may need a percutaneous biliary drain for persistent jaundice.  Systemic treatment discussion: I discussed with the patient about the findings noted on the laparotomy.  We may have to go  back on chemotherapy to get some control of her disease. Different options discussed include adding Perjeta versus adding chemo and Perjeta.  Await the results of ERCP.

## 2018-04-16 NOTE — Progress Notes (Signed)
Okay to treat with echocardiogram from 12/24/2017 per Dr. [name].  Nurse will schedule appointment for echo.

## 2018-04-16 NOTE — Patient Instructions (Signed)
[facility] Discharge Instructions for Patients Receiving Chemotherapy  Today you received the following chemotherapy agents Herceptin  To help prevent nausea and vomiting after your treatment, we encourage you to take your nausea medication as directed   If you develop nausea and vomiting that is not controlled by your nausea medication, call the clinic.   BELOW ARE SYMPTOMS THAT SHOULD BE REPORTED IMMEDIATELY:  *FEVER GREATER THAN 100.5 F  *CHILLS WITH OR WITHOUT FEVER  NAUSEA AND VOMITING THAT IS NOT CONTROLLED WITH YOUR NAUSEA MEDICATION  *UNUSUAL SHORTNESS OF BREATH  *UNUSUAL BRUISING OR BLEEDING  TENDERNESS IN MOUTH AND THROAT WITH OR WITHOUT PRESENCE OF ULCERS  *URINARY PROBLEMS  *BOWEL PROBLEMS  UNUSUAL RASH Items with * indicate a potential emergency and should be followed up as soon as possible.  Feel free to call the clinic should you have any questions or concerns. The clinic phone number is [phone].  Please show the CHEMO ALERT CARD at check-in to the Emergency Department and triage nurse.   

## 2018-04-16 NOTE — Progress Notes (Signed)
Patient Care Team: [name], MD as PCP - General (Internal Medicine)  DIAGNOSIS:  Encounter Diagnosis  Name Primary?  . Metastatic breast cancer ([place])     SUMMARY OF ONCOLOGIC HISTORY:   Metastatic breast cancer ([place])   06/05/2006 Initial Diagnosis    Left breast biopsy: DCIS with apocrine features ER 30%, PR 13%    06/19/2006 Surgery    Left lumpectomy: stage I, pT1a, pN0(i)(sn), pMX, 0.3 cm IDC, grade 1, with extensive high-grade DCIS , margins negative ER 38 %, PR 93 %, Ki-67 10%, HER-2/neu 2+, no additional tissue available for FISH testing, with 0/1 left axillary lymph nodes.    07/30/2006 - 09/17/2006 Radiation Therapy    Adjuvant radiation therapy    09/24/2006 - 09/25/2011 Anti-estrogen oral therapy    Antiestrogen therapy with Arimidex 5 years    04/26/2016 Relapse/Recurrence    Innumerable hepatic lesions seen in both lobes most are 1-2 cm size somewhat confluent largest in the posterior right hepatic dome 3 x 4.6 cm    05/07/2016 PET scan    PET/CT scan: Extensive metastatic involvement of both lobes of the liver, primary could be liver, phalangeal or metastatic disease, hypermetabolic thoracic LN left axillary, left supraclavicular and right CP angle LN, hypermetabolic upper abdominal LN    05/16/2016 Initial Biopsy    Liver biopsy: Metastatic carcinoma breast primary strong positivity for CK 7 weak focal positivity for ER, GCDFP, negative for PR, CDX 2, TTF-1, WT 1; HER-2 positive    06/13/2016 - 11/07/2016 Chemotherapy    Taxotere Herceptin and Perjeta every 3 weeks (Taxotere was held for elevated LFTs for cycles 1 and 2)     11/28/2016 -  Chemotherapy    Herceptin maintenance therapy for metastatic breast cancer every 3 weeks     03/24/2018 Surgery    Chronic cholecystitis: Diagnostic laparoscopy: Due to intermittent attacks of biliary colic: The gallbladder was rockhard and not distensible with tumor palpated in the porta hepatis, extensive lucent liver lesions,  decision was made to stop the surgery.     CHIEF COMPLIANT: Obstructive jaundice, Herceptin treatment  INTERVAL HISTORY: Monique Figueroa is a 84 year old with above-mentioned history metastatic breast cancer who was on Herceptin maintenance every 3 weeks and was having gallbladder pain.  She underwent laparotomy by Dr. Dalbert Batman and they closed her back because there was extensive metastatic disease which made the gallbladder rocksolid and also tumor extending into the biliary tree.  She was referred to gastroenterology who are planning to do ERCP next Monday.  The plan if the ERCP does not work would be to send her for interventional radiology for percutaneous biliary drainage procedure.  REVIEW OF SYSTEMS:   Constitutional: Generalized weakness and sluggishness Eyes: Denies blurriness of vision Ears, nose, mouth, throat, and face: Denies mucositis or sore throat Respiratory: Denies cough, dyspnea or wheezes Cardiovascular: Denies palpitation, chest discomfort Gastrointestinal:  Denies nausea, heartburn or change in bowel habits Skin: Jaundice Lymphatics: Denies new lymphadenopathy or easy bruising Neurological:Denies numbness, tingling or new weaknesses Behavioral/Psych: Mood is stable, no new changes  Extremities: No lower extremity edema  All other systems were reviewed with the patient and are negative.  I have reviewed the past medical history, past surgical history, social history and family history with the patient and they are unchanged from previous note.  ALLERGIES:  is allergic to perjeta [pertuzumab] and sulfa antibiotics.  MEDICATIONS:  Current Outpatient Medications  Medication Sig Dispense Refill  . acetaminophen (TYLENOL) 325 MG tablet Take 325  mg by mouth daily as needed for moderate pain or headache.     . benzocaine (ORAJEL) 10 % mucosal gel Use as directed 1 application in the mouth or throat as needed for mouth pain.    . calcium carbonate (TUMS - DOSED IN MG  ELEMENTAL CALCIUM) 500 MG chewable tablet Chew 1 tablet by mouth daily as needed for indigestion or heartburn.    . Cholecalciferol (VITAMIN D) 2000 units tablet Take 2,000 Units by mouth daily.    . diphenhydrAMINE (BENADRYL) 25 MG tablet Take 25 mg by mouth daily as needed for allergies.    . hydrochlorothiazide (HYDRODIURIL) 25 MG tablet Take 25 mg by mouth daily.  0  . HYDROcodone-acetaminophen (NORCO) 5-325 MG tablet Take 1-2 tablets by mouth every 6 (six) hours as needed for moderate pain or severe pain. (Patient not taking: Reported on 04/15/2018) 30 tablet 0  . levothyroxine (SYNTHROID, LEVOTHROID) 125 MCG tablet Take 125 mcg by mouth daily before breakfast.     . metFORMIN (GLUCOPHAGE) 500 MG tablet Take 500 mg by mouth daily with breakfast.     . omeprazole (PRILOSEC OTC) 20 MG tablet Take 20 mg by mouth daily as needed (acid reflux).     . ondansetron (ZOFRAN) 4 MG tablet Take 1 tablet (4 mg total) by mouth daily as needed for nausea or vomiting. (Patient not taking: Reported on 04/15/2018) 30 tablet 1  . ondansetron (ZOFRAN-ODT) 8 MG disintegrating tablet Take 1 tablet (8 mg total) by mouth every 8 (eight) hours as needed for nausea or vomiting. 20 tablet 1  . oxyCODONE-acetaminophen (PERCOCET) 5-325 MG tablet Take 1 tablet by mouth every 4 (four) hours as needed for moderate pain. 30 tablet 0  . prochlorperazine (COMPAZINE) 10 MG tablet Take 1 tablet (10 mg total) by mouth every 6 (six) hours as needed (Nausea or vomiting). 30 tablet 1  . prochlorperazine (COMPAZINE) 25 MG suppository Place 1 suppository (25 mg total) rectally every 12 (twelve) hours as needed for nausea or vomiting. 12 suppository 0  . Propylene Glycol (SYSTANE COMPLETE) 0.6 % SOLN Place 1 drop into both eyes 2 (two) times daily.    . sodium chloride (OCEAN) 0.65 % SOLN nasal spray Place 1 spray into both nostrils as needed for congestion.     No current facility-administered medications for this visit.     PHYSICAL  EXAMINATION: ECOG PERFORMANCE STATUS: 1 - Symptomatic but completely ambulatory  Vitals:   04/16/18 1016  BP: (!) 145/75  Pulse: 94  Resp: 16  Temp: 97.8 F (36.6 C)  SpO2: 99%   Filed Weights   04/16/18 1016  Weight: 149 lb 9.6 oz (67.9 kg)    GENERAL:alert, no distress and comfortable SKIN: Severe jaundice EYES: normal, Conjunctiva are pink and non-injected, sclera clear OROPHARYNX:no exudate, no erythema and lips, buccal mucosa, and tongue normal  NECK: supple, thyroid normal size, non-tender, without nodularity LYMPH:  no palpable lymphadenopathy in the cervical, axillary or inguinal LUNGS: clear to auscultation and percussion with normal breathing effort HEART: regular rate & rhythm and no murmurs and no lower extremity edema ABDOMEN:abdomen soft, non-tender and normal bowel sounds MUSCULOSKELETAL:no cyanosis of digits and no clubbing  NEURO: alert & oriented x 3 with fluent speech, no focal motor/sensory deficits EXTREMITIES: No lower extremity edema   LABORATORY DATA:  I have reviewed the data as listed CMP Latest Ref Rng & Units 03/16/2018 02/12/2018 01/22/2018  Glucose 70 - 99 mg/dL 102(H) 126(H) 122  BUN 8 - 23 mg/dL  _0 Creatinine 0.44 - 1.00 mg/dL 0.71 0.80 0.78  Sodium 135 - 145 mmol/L 140 140 140  Potassium 3.5 - 5.1 mmol/L 4.3 3.9 3.6  Chloride 98 - 111 mmol/L 104 105 104  CO2 22 - 32 mmol/L _1 Calcium 8.9 - 10.3 mg/dL 9.3 9.1 9.7  Total Protein 6.5 - 8.1 g/dL 7.0 7.1 7.1  Total Bilirubin 0.3 - 1.2 mg/dL 0.7 0.7 0.5  Alkaline Phos 38 - 126 U/L 189(H) 165(H) 115  AST 15 - 41 U/L 99(H) 40 29  ALT 0 - 44 U/L 68(H) 23 11    Lab Results  Component Value Date   WBC 4.0 04/16/2018   HGB 11.2 (L) 04/16/2018   HCT 32.5 (L) 04/16/2018   MCV 97.2 04/16/2018   PLT 220 04/16/2018   NEUTROABS 3.0 04/16/2018    ASSESSMENT & PLAN:  Metastatic breast cancer ([place]) Metastatic breast cancer with innumerable liver metastases detected on ultrasound and  recent MRI of the liver on 04/26/2016 (Prior history of left breast IDC T1 1 N0 stage IA 0.3 cm grade 1 tumor that was ER 38%, PR 93%, Ki-67 10%, HER-2 2+, no tissue for FISH testing, 0/1 lymph node negative, status post lumpectomy radiation and 5 years of Arimidex completed in February 2013)  PET/CT scan: 05/07/2016: Extensive metastatic involvement of both lobes of the liver, primary could be liver, phalangeal or metastatic disease, hypermetabolic thoracic lymph nodes left axillary, left supraclavicular and right CP angle lymph nodes, hypermetabolic upper abdominal lymph nodes  Liver biopsy10/07/2016: Metastatic carcinoma breast primary strong positivity for CK 7 weak focal positivity for ER, GCDFP, negative for PR, CDX 2, TTF-1, WT 1; HER-2 positive  Treatment plan: 1. Taxotere Herceptin and Perjeta every 3 weeks palliative chemotherapy started 06/13/16 (Perjeta discontinued after 5 cycles due to allergy) 2. followed by Herceptin maintenance  ----------------------------------------------------------------------------------------------------------------------------------------- Current Treatment:Herceptin maintenance Peripheral neuropathy: Not bothering her, Blindness that was transient: on81 mg aspirin daily  Chronic cholecystitis: 03/24/2018 diagnostic laparoscopy: Due to intermittent attacks of biliary colic: The gallbladder was rockhard and not distensible with tumor palpated in the porta hepatis, extensive lucent liver lesions, decision was made to stop the surgery.  Patient is scheduled to undergo ERCP and a gastroenterology follow-up.  This will help facilitate if there is a possibility of stenting.  Otherwise she may need a percutaneous biliary drain for persistent jaundice.  Systemic treatment discussion: I discussed with the patient about the findings noted on the laparotomy.  We may have to go back on chemotherapy to get some control of her disease. Different options discussed  adding Abraxane versus [place] versus carboplatin. Because she has peripheral neuropathy and was slightly intolerant to taxanes, I would like to add carboplatin to her next Herceptin in 3 weeks.   Elevated bilirubin and AST and ALT: Due to hepatic metastatic disease and obstructive jaundice. Await the results of ERCP. If the ERCP is not successful in improving her jaundice then we will plan for percutaneous biliary drainage.     No orders of the defined types were placed in this encounter.  The patient has a good understanding of the overall plan. she agrees with it. she will call with any problems that may develop before the next visit here.   Harriette Ohara, MD 04/16/18

## 2018-04-16 NOTE — Progress Notes (Signed)
Okay for treatment with CMP results today per Dr. [name].

## 2018-04-17 ENCOUNTER — Other Ambulatory Visit: Payer: Self-pay

## 2018-04-17 ENCOUNTER — Encounter (HOSPITAL_COMMUNITY): Payer: Self-pay | Admitting: *Deleted

## 2018-04-17 NOTE — Progress Notes (Signed)
Spoke with pt for pre-op call. Pt denies cardiac history. Pt is a type 2 diabetic, last A1C was 5.5 on 03/16/18. States she has not checked her blood sugar in quite awhile.   Echo - 12/24/17 in Epic EKG - 03/16/18 in [place]

## 2018-04-20 ENCOUNTER — Ambulatory Visit (HOSPITAL_COMMUNITY): Payer: Medicare Other

## 2018-04-20 ENCOUNTER — Encounter (HOSPITAL_COMMUNITY)
Admission: RE | Disposition: A | Discharge status: Home / Self Care | Payer: Self-pay | Source: Ambulatory Visit | Attending: Gastroenterology

## 2018-04-20 ENCOUNTER — Ambulatory Visit (HOSPITAL_COMMUNITY): Payer: Medicare Other | Admitting: Anesthesiology

## 2018-04-20 ENCOUNTER — Ambulatory Visit (HOSPITAL_COMMUNITY)
Admission: RE | Admit: 2018-04-20 | Discharge: 2018-04-20 | Disposition: A | Discharge status: Home / Self Care | Payer: Medicare Other | Source: Ambulatory Visit | Attending: Gastroenterology | Admitting: Gastroenterology

## 2018-04-20 ENCOUNTER — Encounter (HOSPITAL_COMMUNITY): Payer: Self-pay | Admitting: *Deleted

## 2018-04-20 ENCOUNTER — Other Ambulatory Visit: Payer: Self-pay | Admitting: Gastroenterology

## 2018-04-20 DIAGNOSIS — Z853 Personal history of malignant neoplasm of breast: Secondary | ICD-10-CM | POA: Diagnosis not present

## 2018-04-20 DIAGNOSIS — E119 Type 2 diabetes mellitus without complications: Secondary | ICD-10-CM | POA: Insufficient documentation

## 2018-04-20 DIAGNOSIS — C787 Secondary malignant neoplasm of liver and intrahepatic bile duct: Secondary | ICD-10-CM | POA: Insufficient documentation

## 2018-04-20 DIAGNOSIS — K8050 Calculus of bile duct without cholangitis or cholecystitis without obstruction: Secondary | ICD-10-CM | POA: Diagnosis not present

## 2018-04-20 DIAGNOSIS — K831 Obstruction of bile duct: Secondary | ICD-10-CM | POA: Diagnosis not present

## 2018-04-20 DIAGNOSIS — Z85850 Personal history of malignant neoplasm of thyroid: Secondary | ICD-10-CM | POA: Insufficient documentation

## 2018-04-20 DIAGNOSIS — Z9049 Acquired absence of other specified parts of digestive tract: Secondary | ICD-10-CM | POA: Diagnosis not present

## 2018-04-20 DIAGNOSIS — K828 Other specified diseases of gallbladder: Secondary | ICD-10-CM | POA: Diagnosis not present

## 2018-04-20 DIAGNOSIS — E039 Hypothyroidism, unspecified: Secondary | ICD-10-CM | POA: Diagnosis not present

## 2018-04-20 DIAGNOSIS — K838 Other specified diseases of biliary tract: Secondary | ICD-10-CM

## 2018-04-20 DIAGNOSIS — I10 Essential (primary) hypertension: Secondary | ICD-10-CM | POA: Insufficient documentation

## 2018-04-20 DIAGNOSIS — K219 Gastro-esophageal reflux disease without esophagitis: Secondary | ICD-10-CM | POA: Insufficient documentation

## 2018-04-20 HISTORY — PX: BALLOON DILATION: SHX5330

## 2018-04-20 HISTORY — PX: BILIARY STENT PLACEMENT: SHX5538

## 2018-04-20 HISTORY — DX: Anemia, unspecified: D64.9

## 2018-04-20 HISTORY — DX: Unspecified osteoarthritis, unspecified site: M19.90

## 2018-04-20 HISTORY — PX: SPHINCTEROTOMY: SHX5544

## 2018-04-20 HISTORY — DX: Malignant neoplasm of thyroid gland: C73

## 2018-04-20 HISTORY — PX: ENDOSCOPIC RETROGRADE CHOLANGIOPANCREATOGRAPHY (ERCP) WITH PROPOFOL: SHX5810

## 2018-04-20 LAB — GLUCOSE, CAPILLARY: Glucose-Capillary: 90 mg/dL (ref 70–99)

## 2018-04-20 SURGERY — ENDOSCOPIC RETROGRADE CHOLANGIOPANCREATOGRAPHY (ERCP) WITH PROPOFOL
Anesthesia: General

## 2018-04-20 MED ORDER — LIDOCAINE 2% (20 MG/ML) 5 ML SYRINGE
INTRAMUSCULAR | Status: DC | PRN
  Administered 2018-04-20: 60 mg via INTRAVENOUS

## 2018-04-20 MED ORDER — PROMETHAZINE HCL 25 MG/ML IJ SOLN
6.2500 mg | INTRAMUSCULAR | Status: DC | PRN
  Administered 2018-04-20: 6.25 mg via INTRAVENOUS

## 2018-04-20 MED ORDER — SODIUM CHLORIDE 0.9 % IV SOLN: INTRAVENOUS | Status: DC

## 2018-04-20 MED ORDER — IOPAMIDOL (ISOVUE-300) INJECTION 61%
INTRAVENOUS | Status: DC | PRN
  Administered 2018-04-20: 75 mL via INTRAVENOUS

## 2018-04-20 MED ORDER — FENTANYL CITRATE (PF) 100 MCG/2ML IJ SOLN
INTRAMUSCULAR | Status: DC | PRN
  Administered 2018-04-20: 100 ug via INTRAVENOUS

## 2018-04-20 MED ORDER — OXYCODONE HCL 5 MG PO TABS: 5.0000 mg | ORAL_TABLET | Freq: Once | ORAL | Status: DC | PRN

## 2018-04-20 MED ORDER — CIPROFLOXACIN IN D5W 400 MG/200ML IV SOLN
INTRAVENOUS | Status: AC
  Filled 2018-04-20: qty 200

## 2018-04-20 MED ORDER — FENTANYL CITRATE (PF) 100 MCG/2ML IJ SOLN
INTRAMUSCULAR | Status: AC
  Filled 2018-04-20: qty 2

## 2018-04-20 MED ORDER — INDOMETHACIN 50 MG RE SUPP
RECTAL | Status: AC
  Filled 2018-04-20: qty 2

## 2018-04-20 MED ORDER — IOPAMIDOL (ISOVUE-300) INJECTION 61%
INTRAVENOUS | Status: AC
  Filled 2018-04-20: qty 100

## 2018-04-20 MED ORDER — PHENYLEPHRINE HCL 10 MG/ML IJ SOLN
INTRAMUSCULAR | Status: DC | PRN
  Administered 2018-04-20 (×2): 120 ug via INTRAVENOUS
  Administered 2018-04-20: 80 ug via INTRAVENOUS

## 2018-04-20 MED ORDER — PROPOFOL 10 MG/ML IV BOLUS
INTRAVENOUS | Status: DC | PRN
  Administered 2018-04-20: 150 mg via INTRAVENOUS

## 2018-04-20 MED ORDER — INDOMETHACIN 50 MG RE SUPP
RECTAL | Status: DC | PRN
  Administered 2018-04-20: 100 mg via RECTAL

## 2018-04-20 MED ORDER — CIPROFLOXACIN IN D5W 400 MG/200ML IV SOLN
400.0000 mg | Freq: Once | INTRAVENOUS | Status: AC
  Administered 2018-04-20: 400 mg via INTRAVENOUS

## 2018-04-20 MED ORDER — GLUCAGON HCL RDNA (DIAGNOSTIC) 1 MG IJ SOLR
INTRAMUSCULAR | Status: DC | PRN
  Administered 2018-04-20 (×3): .25 mL via INTRAVENOUS

## 2018-04-20 MED ORDER — DEXAMETHASONE SODIUM PHOSPHATE 10 MG/ML IJ SOLN
INTRAMUSCULAR | Status: DC | PRN
  Administered 2018-04-20: 10 mg via INTRAVENOUS

## 2018-04-20 MED ORDER — ONDANSETRON HCL 4 MG/2ML IJ SOLN
INTRAMUSCULAR | Status: DC | PRN
  Administered 2018-04-20: 4 mg via INTRAVENOUS

## 2018-04-20 MED ORDER — SUGAMMADEX SODIUM 200 MG/2ML IV SOLN
INTRAVENOUS | Status: DC | PRN
  Administered 2018-04-20: 200 mg via INTRAVENOUS

## 2018-04-20 MED ORDER — FENTANYL CITRATE (PF) 100 MCG/2ML IJ SOLN
25.0000 ug | INTRAMUSCULAR | Status: DC | PRN
  Administered 2018-04-20: 25 ug via INTRAVENOUS

## 2018-04-20 MED ORDER — GLUCAGON HCL RDNA (DIAGNOSTIC) 1 MG IJ SOLR
INTRAMUSCULAR | Status: AC
  Filled 2018-04-20: qty 1

## 2018-04-20 MED ORDER — OXYCODONE HCL 5 MG/5ML PO SOLN: 5.0000 mg | Freq: Once | ORAL | Status: DC | PRN

## 2018-04-20 MED ORDER — ONDANSETRON HCL 4 MG/2ML IJ SOLN
INTRAMUSCULAR | Status: AC
  Filled 2018-04-20: qty 2

## 2018-04-20 MED ORDER — CIPROFLOXACIN HCL 500 MG PO TABS: 500.0000 mg | ORAL_TABLET | Freq: Two times a day (BID) | ORAL | 0 refills | Status: AC

## 2018-04-20 MED ORDER — LACTATED RINGERS IV SOLN
INTRAVENOUS | Status: DC
  Administered 2018-04-20 (×2): via INTRAVENOUS

## 2018-04-20 MED ORDER — ROCURONIUM BROMIDE 50 MG/5ML IV SOSY
PREFILLED_SYRINGE | INTRAVENOUS | Status: DC | PRN
  Administered 2018-04-20: 40 mg via INTRAVENOUS

## 2018-04-20 MED ORDER — ONDANSETRON HCL 4 MG/2ML IJ SOLN
4.0000 mg | Freq: Once | INTRAMUSCULAR | Status: AC
  Administered 2018-04-20: 4 mg via INTRAVENOUS

## 2018-04-20 MED ORDER — PROMETHAZINE HCL 25 MG/ML IJ SOLN
INTRAMUSCULAR | Status: AC
  Filled 2018-04-20: qty 1

## 2018-04-20 MED ORDER — ONDANSETRON HCL 4 MG/2ML IJ SOLN: 4.0000 mg | Freq: Once | INTRAMUSCULAR | Status: DC | PRN

## 2018-04-20 NOTE — Progress Notes (Signed)
This note also relates to the following rows which could not be included: Pulse Rate - Cannot attach notes to unvalidated device data ECG Heart Rate - Cannot attach notes to unvalidated device data Resp - Cannot attach notes to unvalidated device data BP - Cannot attach notes to unvalidated device data SpO2 - Cannot attach notes to unvalidated device data  Patient nauseas and dry heaving

## 2018-04-20 NOTE — Op Note (Signed)
[facility] Patient Name: Monique Figueroa Procedure Date : 04/20/2018 MRN: [id number] Attending MD: Justice Britain , MD Date of Birth: 06-12-1934 CSN: [id number] Age: 84 Admit Type: Outpatient Procedure:                ERCP Indications:              Malignant Bismuth type I stricture (limited to the                            common hepatic duct distal to the confluence of the                            right and left hepatic ducts), Biliary dilation on                            Computed Tomogram Scan, Jaundice, Abnormal liver                            function test Providers:                Justice Britain, MD, Kingsley Plan, RN, Elspeth Cho Tech., Technician, Gershon Crane CRNA, CRNA Referring MD:             Arlyss Repress, Nicholas Lose, MD Medicines:                General Anesthesia, Indomethacin 100 mg PR, Cipro                            779 mg IV Complications:            No immediate complications. Estimated Blood Loss:     Estimated blood loss was minimal. Procedure:                Pre-Anesthesia Assessment:                           - Prior to the procedure, a History and Physical                            was performed, and patient medications and                            allergies were reviewed. The patient's tolerance of                            previous anesthesia was also reviewed. The risks                            and benefits of the procedure and the sedation                            options and risks were discussed with the patient.  All questions were answered, and informed consent                            was obtained. Prior Anticoagulants: The patient has                            taken no previous anticoagulant or antiplatelet                            agents. ASA Grade Assessment: III - A patient with                            severe systemic disease. After reviewing  the risks                            and benefits, the patient was deemed in                            satisfactory condition to undergo the procedure.                           After obtaining informed consent, the scope was                            passed under direct vision. Throughout the                            procedure, the patient's blood pressure, pulse, and                            oxygen saturations were monitored continuously. The                            [id number]-[id number] ([id number]) Olympus ERCP was introduced                            through the mouth, and used to inject contrast into                            and used to inject contrast into the bile duct. The                            ERCP was accomplished without difficulty. The                            patient tolerated the procedure. Fluoroscopic                            images on Canopy/PACS. Scope In: Scope Out: Findings:      The scout film was normal.      The esophagus was successfully intubated under direct vision without       detailed examination of the pharynx, larynx, and associated structures,       and upper GI tract.  The major papilla was adjacent to a diverticulum.       The major papilla was otherwise normal.      A short 0.035 inch Soft Jagwire was passed into the biliary tree. The       short-nosed traction sphincterotome was passed over the guidewire and       the bile duct was then deeply cannulated. Contrast was injected. I       personally interpreted the bile duct images. Ductal flow of contrast was       adequate. Image quality was adequate. Contrast extended to the hepatic       ducts. Opacification of the left main hepatic duct and the presumed       right main hepatic duct was successful. The maximum diameter of the       common bile duct was 5 mm. The common hepatic duct contained a single       severe stenosis 5 mm in length. The left main hepatic duct contained a       single  moderate stenosis 5 mm in length. The left main hepatic duct and       left intrahepatic branches were moderately dilated, secondary to       aforementioned strictures. The largest diameter was 9 mm. The presumed       right main hepatic duct contained a single mild stenosis 3 mm in length       (though based on the overall injection of contrast, this could in       hindsight be another branch off of the left main). The upstream presumed       right main hepatic duct and right intrahepatic branches were mildly       dilated, secondary to aforementioned stricture. The largest diameter was       4-5 mm. A 6 mm biliary sphincterotomy was made with a monofilament short       nose sphincterotome using ERBE electrocautery. There was no       post-sphincterotomy bleeding. To discover objects, the biliary tree was       swept with a retrieval balloon starting at the bifurcation. Sludge was       swept from the duct. A second short 0.035 inch Soft Jagwire was passed       into the biliary tree and able to cross into the region of the presumed       right hepatic duct. The upper third of the main bile duct and the left       main hepatic duct, where the strictures were present were successfully       dilated with a Hurricane 4 mm balloon and a Hurricane 6 mm balloon       dilator. The right main hepatic duct was successfully dilated with a       Hurricane 4 mm balloon and a Hurricane 6 mm balloon dilator. To discover       objects, the biliary tree was swept with a retrieval balloon starting at       the bifurcation. Nothing was found. Occlusion cholangiogram was       performed showing the aforementioned areas of the biliary tree but no       other issues. After reviewing the imaging and the size of the distal       CBD, I had concerns about the ability to successfully place 2 stents       within the biliary tree.  Due to the much more singificant dilation in       the left system, decision was made  to attempt placement of stent in that       region for highest priority. The wire from the presumed right hepatic       duct was removed. One [place] 7 Fr by 12 cm transpapillary plastic       biliary stent with a single external flap and a single internal flap was       placed into the left hepatic duct. Bile flowed through the stent. The       stent was in good position.      Pancreatogram was not performed.      The endoscope was withdrawn from the patient. Impression:               - The major papilla was adjacent to a diverticulum                            but otherwise was normal.                           - A single severe biliary stricture was found in                            the common hepatic duct and the left hepatic duct.                            The strictures were malignant appearing. Upstream                            biliary tree was dilated. These strictures were                            dilated. These strictures were traversed with a                            stent into the left hepatic duct system.                           - A single mild biliary stricture was found in the                            presumed right main hepatic duct (though based on                            fluoroscopy, this could have been a branch off of                            the left system was well). The stricture was                            malignant appearing. This stricture was dilated.                            The upstream ducts were mildly  dilated but not to                            extent of other strictures.                           - Due to size of CBD, concern to be able to                            successfully place 2 stents in region, we placed                            stent in the higher priority strictures.                           - A biliary sphincterotomy was performed.                           - The biliary tree was swept and sludge was found                             initially. Recommendation:           - The patient will be observed post-procedure,                            until all discharge criteria are met.                           - Discharge patient to home.                           - Patient has a contact number available for                            emergencies. The signs and symptoms of potential                            delayed complications were discussed with the                            patient. Return to normal activities tomorrow.                            Written discharge instructions were provided to the                            patient.                           - Check liver enzymes (AST, ALT, alkaline                            phosphatase, bilirubin) at appointment to be  scheduled by referring provider within the next                            week.                           - Observe patient's clinical course.                           - Ciprofloxacin 500 mg BID for 5-days (Rx sent to                            pharmacy) to decrease risk of post-ERCP infectious                            complications.                           - Watch for pancreatitis, bleeding, perforation,                            and cholangitis.                           - Repeat ERCP in 3 months to exchange stent.                           - Dependent on overall clinical effect of ERCP with                            stenting, will consider, if she has improvement in                            her numbers to obtain Intraductal EPIC stents that                            could be used for a more permanent solution to                            drainage of the region.                           - If no significant improvement in LFTs over the                            next week, would discuss with Interventional                            Radiology if there is a target on repeat imaging to                             go after at that time. Her disease burden in her  right lobe may be causing elevation in LFTs as well.                           - The findings and recommendations were discussed                            with the patient.                           - The findings and recommendations were discussed                            with the patient's family. Procedure Code(s):        --- Professional ---                           [phone], Endoscopic retrograde                            cholangiopancreatography (ERCP); with placement of                            endoscopic stent into biliary or pancreatic duct,                            including pre- and post-dilation and guide wire                            passage, when performed, including sphincterotomy,                            when performed, each stent                           [id number], Endoscopic retrograde                            cholangiopancreatography (ERCP); with removal of                            calculi/debris from biliary/pancreatic duct(s) Diagnosis Code(s):        --- Professional ---                           K83.1, Obstruction of bile duct                           R17, Unspecified jaundice                           R94.5, Abnormal results of liver function studies                           K83.8, Other specified diseases of biliary tract CPT copyright 2017 [employer]. All rights reserved. The codes documented in this report are preliminary and upon coder review may  be revised to meet current compliance  requirements. Justice Britain, MD 04/20/2018 9:39:42 AM Number of Addenda: 0

## 2018-04-20 NOTE — Discharge Instructions (Signed)
YOU HAD AN ENDOSCOPIC PROCEDURE TODAY: Refer to the procedure report and other information in the discharge instructions given to you for any specific questions about what was found during the examination. If this information does not answer your questions, please call [place] office at [phone] to clarify.   YOU SHOULD EXPECT: Some feelings of bloating in the abdomen. Passage of more gas than usual. Walking can help get rid of the air that was put into your GI tract during the procedure and reduce the bloating. If you had a lower endoscopy (such as a colonoscopy or flexible sigmoidoscopy) you may notice spotting of blood in your stool or on the toilet paper. Some abdominal soreness may be present for a day or two, also.  DIET: Your first meal following the procedure should be a light meal and then it is ok to progress to your normal diet. A half-sandwich or bowl of soup is an example of a good first meal. Heavy or fried foods are harder to digest and may make you feel nauseous or bloated. Drink plenty of fluids but you should avoid alcoholic beverages for 24 hours. If you had a esophageal dilation, please see attached instructions for diet.    ACTIVITY: Your care partner should take you home directly after the procedure. You should plan to take it easy, moving slowly for the rest of the day. You can resume normal activity the day after the procedure however YOU SHOULD NOT DRIVE, use power tools, machinery or perform tasks that involve climbing or major physical exertion for 24 hours (because of the sedation medicines used during the test).   SYMPTOMS TO REPORT IMMEDIATELY: A gastroenterologist can be reached at any hour. Please call [phone]  for any of the following symptoms:   Following upper endoscopy (EGD, EUS, ERCP, esophageal dilation) Vomiting of blood or coffee ground material  New, significant abdominal pain  New, significant chest pain or pain under the shoulder blades  Painful or  persistently difficult swallowing  New shortness of breath  Black, tarry-looking or red, bloody stools  FOLLOW UP:  If any biopsies were taken you will be contacted by phone or by letter within the next 1-3 weeks. Call [phone]  if you have not heard about the biopsies in 3 weeks.  Please also call with any specific questions about appointments or follow up tests.   Thank you for allowing [place] to provide your healthcare today.

## 2018-04-20 NOTE — Anesthesia Preprocedure Evaluation (Signed)
Anesthesia Evaluation  Patient identified by MRN, date of birth, ID band Patient awake    Reviewed: Allergy & Precautions, NPO status , Patient's Chart, lab work & pertinent test results  History of Anesthesia Complications (+) PONV and history of anesthetic complications  Airway Mallampati: II  TM Distance: >3 FB Neck ROM: Full    Dental no notable dental hx.    Pulmonary neg pulmonary ROS,    Pulmonary exam normal        Cardiovascular hypertension, Normal cardiovascular exam     Neuro/Psych negative neurological ROS  negative psych ROS   GI/Hepatic GERD  Controlled,  Endo/Other  diabetesHypothyroidism   Renal/GU      Musculoskeletal  (+) Arthritis ,   Abdominal   Peds  Hematology negative hematology ROS (+)   Anesthesia Other Findings   Reproductive/Obstetrics                             Anesthesia Physical Anesthesia Plan  ASA: III  Anesthesia Plan: General   Post-op Pain Management:    Induction: Intravenous  PONV Risk Score and Plan: 4 or greater and Ondansetron, Dexamethasone, Promethazine and Treatment may vary due to age or medical condition  Airway Management Planned:   Additional Equipment:   Intra-op Plan:   Post-operative Plan: Extubation in OR  Informed Consent: I have reviewed the patients History and Physical, chart, labs and discussed the procedure including the risks, benefits and alternatives for the proposed anesthesia with the patient or authorized representative who has indicated his/her understanding and acceptance.     Plan Discussed with:   Anesthesia Plan Comments:         Anesthesia Quick Evaluation

## 2018-04-20 NOTE — H&P (Signed)
GASTROENTEROLOGY OUTPATIENT PROCEDURE H&P NOTE   Primary Care Physician: [name], MD  HPI: Monique Figueroa is a 84 y.o. female who presents for ERCP in setting of obstructive jaundice and abnormal imaging.  Past Medical History:  Diagnosis Date  . Anemia   . Appendicitis   . Arthritis   . Biliary dyskinesia 03/24/2018  . Breast cancer ([place]) 06/2006   Invasive ductal and DCIS of left breast  . Breast tumor    History of bilateral breast tumors/cysts  . Cholecystitis   . Cyst of spinal meninges   . Diabetes mellitus without complication ([place])   . GERD (gastroesophageal reflux disease)   . Hypertension   . Hypothyroidism   . Metastatic breast cancer ([place]) 04/26/2016   mets to liver  . PONV (postoperative nausea and vomiting)   . Thyroid cancer ([place])   . Tuberculosis    medullary carcinoma ; denies TB   . UTI (urinary tract infection)    hx   Past Surgical History:  Procedure Laterality Date  . APPENDECTOMY    . BREAST CYST EXCISION Bilateral    Several asprirations and excisions  . BREAST LUMPECTOMY WITH AXILLARY LYMPH NODE BIOPSY Left 06/19/2006   Invasive ductal and in situ carcinoma, node negative  . CATARACT EXTRACTION, BILATERAL  2009  . CHOLECYSTECTOMY N/A 03/24/2018   Procedure: DIAGNOSTIC LAPAROSCOPY;  Surgeon: Fanny Skates, MD;  Location: [facility];  Service: General;  Laterality: N/A;  . PORTACATH PLACEMENT N/A 06/05/2016   Procedure: INSERTION PORT-A-CATH WITH [place];  Surgeon: Fanny Skates, MD;  Location: [facility];  Service: General;  Laterality: N/A;  . SPINE SURGERY    . TOTAL THYROIDECTOMY     Current Facility-Administered Medications  Medication Dose Route Frequency Provider Last Rate Last Dose  . 0.9 %  sodium chloride infusion   Intravenous Continuous Mansouraty, Telford Nab., MD      . ciprofloxacin (CIPRO) IVPB 400 mg  400 mg Intravenous Once Mansouraty, Telford Nab., MD      . lactated ringers infusion   Intravenous Continuous  Mansouraty, Telford Nab., MD 50 mL/hr at 04/20/18 9381     Allergies  Allergen Reactions  . Perjeta [Pertuzumab] Shortness Of Breath and Palpitations    Turned red   . Sulfa Antibiotics Swelling   Family History  Problem Relation Age of Onset  . Rheum arthritis Mother   . Coronary artery disease Mother   . Dementia Mother   . Diabetes Mother   . Heart Problems Father   . Cancer Sister        Breast cancer  . Cancer Brother        Lung cancer  . Diabetes Sister   . Dementia Sister    Social History   Socioeconomic History  . Marital status: Widowed    Spouse name: Not on file  . Number of children: Not on file  . Years of education: Not on file  . Highest education level: Not on file  Occupational History  . Not on file  Social Needs  . Financial resource strain: Not on file  . Food insecurity:    Worry: Not on file    Inability: Not on file  . Transportation needs:    Medical: Not on file    Non-medical: Not on file  Tobacco Use  . Smoking status: Never Smoker  . Smokeless tobacco: Never Used  Substance and Sexual Activity  . Alcohol use: No  . Drug use: No  .  Sexual activity: Not Currently    Birth control/protection: Post-menopausal  Lifestyle  . Physical activity:    Days per week: Not on file    Minutes per session: Not on file  . Stress: Not on file  Relationships  . Social connections:    Talks on phone: Not on file    Gets together: Not on file    Attends religious service: Not on file    Active member of club or organization: Not on file    Attends meetings of clubs or organizations: Not on file    Relationship status: Not on file  . Intimate partner violence:    Fear of current or ex partner: Not on file    Emotionally abused: Not on file    Physically abused: Not on file    Forced sexual activity: Not on file  Other Topics Concern  . Not on file  Social History Narrative  . Not on file    Physical Exam: Vital signs in last 24  hours: Temp:  [97.6 F (36.4 C)] 97.6 F (36.4 C) (09/16 2951) Pulse Rate:  [80] 80 (09/16 0632) Resp:  [13] 13 (09/16 0632) BP: (169)/(68) 169/68 (09/16 0632) SpO2:  [96 %] 96 % (09/16 8841)   GEN: NAD EYE: Sclerae icteric ENT: MMM CV: RR without R/Gs  RESP: CTAB posteriorly GI: Soft, mild TTP in RUQ, ND NEURO:  Alert & Oriented x 3  Lab Results: No results for input(s): WBC, HGB, HCT, PLT in the last 72 hours. BMET No results for input(s): NA, K, CL, CO2, GLUCOSE, BUN, CREATININE, CALCIUM in the last 72 hours. LFT No results for input(s): PROT, ALBUMIN, AST, ALT, ALKPHOS, BILITOT, BILIDIR, IBILI in the last 72 hours. PT/INR No results for input(s): LABPROT, INR in the last 72 hours.   Impression / Plan: This is a 84 y.o.female who presents for ERCP attempt in setting of obstructive jaundice.  The risks of an ERCP were discussed at length, including but not limited to the risk of perforation, bleeding, abdominal pain, post-ERCP pancreatitis (while usually mild can be severe and even life threatening).  The risks and benefits of endoscopic evaluation were discussed with the patient; these include but are not limited to the risk of perforation, infection, bleeding, missed lesions, lack of diagnosis, severe illness requiring hospitalization, as well as anesthesia and sedation related illnesses.  The patient and son are agreeable to proceed.    Justice Britain, MD [facility] Office # [phone]

## 2018-04-21 ENCOUNTER — Encounter (HOSPITAL_COMMUNITY): Payer: Self-pay | Admitting: Gastroenterology

## 2018-04-21 ENCOUNTER — Ambulatory Visit (HOSPITAL_COMMUNITY)
Admission: RE | Admit: 2018-04-21 | Discharge: 2018-04-21 | Disposition: A | Discharge status: Home / Self Care | Payer: Medicare Other | Source: Ambulatory Visit | Attending: Hematology and Oncology | Admitting: Hematology and Oncology

## 2018-04-21 DIAGNOSIS — I272 Pulmonary hypertension, unspecified: Secondary | ICD-10-CM | POA: Diagnosis not present

## 2018-04-21 DIAGNOSIS — Z79899 Other long term (current) drug therapy: Secondary | ICD-10-CM

## 2018-04-21 DIAGNOSIS — I10 Essential (primary) hypertension: Secondary | ICD-10-CM | POA: Diagnosis not present

## 2018-04-21 DIAGNOSIS — I351 Nonrheumatic aortic (valve) insufficiency: Secondary | ICD-10-CM | POA: Diagnosis not present

## 2018-04-21 DIAGNOSIS — Z5181 Encounter for therapeutic drug level monitoring: Secondary | ICD-10-CM | POA: Diagnosis not present

## 2018-04-21 NOTE — Telephone Encounter (Signed)
-----   Message from Irving Copas., MD sent at 04/20/2018  6:09 PM EDT ----- Chong Sicilian, Please put patient in for follow up/repeat ERCP recall in 63-months for stent exchange. Thank you. Chester Holstein

## 2018-04-21 NOTE — Telephone Encounter (Signed)
Recall in EPIC

## 2018-04-21 NOTE — Anesthesia Postprocedure Evaluation (Signed)
Anesthesia Post Note  Patient: Monique Figueroa  Procedure(s) Performed: ENDOSCOPIC RETROGRADE CHOLANGIOPANCREATOGRAPHY (ERCP) WITH PROPOFOL  (N/A ) SPHINCTEROTOMY BALLOON DILATION (N/A ) BILIARY STENT PLACEMENT     Patient location during evaluation: PACU Anesthesia Type: General Level of consciousness: awake and alert Pain management: pain level controlled Vital Signs Assessment: post-procedure vital signs reviewed and stable Respiratory status: spontaneous breathing, nonlabored ventilation, respiratory function stable and patient connected to nasal cannula oxygen Cardiovascular status: stable Postop Assessment: no apparent nausea or vomiting Anesthetic complications: no    Last Vitals:  Vitals:   04/20/18 1025 04/20/18 1035  BP: (!) 187/64 (!) 189/62  Pulse: 88 86  Resp: 17 13  Temp:    SpO2: 96% 92%    Last Pain:  Vitals:   04/20/18 1045  TempSrc:   PainSc: 3                  Lidia Collum

## 2018-04-21 NOTE — Progress Notes (Signed)
  Echocardiogram 2D Echocardiogram has been performed.  Monique Figueroa 04/21/2018, 9:42 AM

## 2018-04-21 NOTE — Transfer of Care (Signed)
Immediate Anesthesia Transfer of Care Note  Patient: Raoul Pitch  Procedure(s) Performed: ENDOSCOPIC RETROGRADE CHOLANGIOPANCREATOGRAPHY (ERCP) WITH PROPOFOL  (N/A ) SPHINCTEROTOMY BALLOON DILATION (N/A ) BILIARY STENT PLACEMENT  Patient Location: Endoscopy Unit  Anesthesia Type:General  Level of Consciousness: awake, alert  and oriented  Airway & Oxygen Therapy: Patient connected to nasal cannula oxygen  Post-op Assessment: Post -op Vital signs reviewed and stable  Post vital signs: stable  Last Vitals:  Vitals Value Taken Time  BP    Temp    Pulse    Resp    SpO2      Last Pain:  Vitals:   04/20/18 1045  TempSrc:   PainSc: 3          Complications: No apparent anesthesia complications

## 2018-04-24 ENCOUNTER — Other Ambulatory Visit: Payer: Self-pay | Admitting: *Deleted

## 2018-04-24 ENCOUNTER — Telehealth: Payer: Self-pay | Admitting: *Deleted

## 2018-04-24 DIAGNOSIS — C50919 Malignant neoplasm of unspecified site of unspecified female breast: Secondary | ICD-10-CM

## 2018-04-24 DIAGNOSIS — C787 Secondary malignant neoplasm of liver and intrahepatic bile duct: Secondary | ICD-10-CM

## 2018-04-24 NOTE — Telephone Encounter (Signed)
This RN returned VM from pt stating " I had a procedure done and they said I need to get my labs for my liver rechecked"  Per discussion - pt states after the procedure earlier this week she was told to " call the referring doctor and get them to recheck the labs "  Monique Figueroa states Dr Dalbert Batman referred her for procedure " but I come to your office every 3 weeks and thought I could just get them rechecked at my next visit "  Pt is receiving treatment every 3 weeks with next expected treatment 05/07/2018- not yet scheduled.  Noted lab abnormalities from last week with pt having an ERCP on 04/20/2018.  Pt is unable to come in today due to " don't have transportation ".  Appointment made for AM on Monday 9/23 with pt understanding to wait for nurse post lab draw for review for possible other needs.  Presently pt states she had resolution of nausea and vomiting after the procedure but nausea has mildly returned today.  She is resting and " just babying myself ". Pt has anti nausea medications and is using with benefit. She is maintaining hydration.  This RN discussed concern for symptoms worsening over the weekend- with advice if symptoms do not improve or worsen she should proceed to the ER.   Pt verbalized understanding.

## 2018-04-27 ENCOUNTER — Inpatient Hospital Stay: Payer: Medicare Other

## 2018-04-27 ENCOUNTER — Other Ambulatory Visit: Payer: Medicare Other

## 2018-04-27 DIAGNOSIS — R531 Weakness: Secondary | ICD-10-CM | POA: Diagnosis not present

## 2018-04-27 DIAGNOSIS — K831 Obstruction of bile duct: Secondary | ICD-10-CM | POA: Diagnosis not present

## 2018-04-27 DIAGNOSIS — Z170 Estrogen receptor positive status [ER+]: Secondary | ICD-10-CM | POA: Diagnosis not present

## 2018-04-27 DIAGNOSIS — Z79899 Other long term (current) drug therapy: Secondary | ICD-10-CM | POA: Diagnosis not present

## 2018-04-27 DIAGNOSIS — K811 Chronic cholecystitis: Secondary | ICD-10-CM | POA: Diagnosis not present

## 2018-04-27 DIAGNOSIS — Z923 Personal history of irradiation: Secondary | ICD-10-CM | POA: Diagnosis not present

## 2018-04-27 DIAGNOSIS — C778 Secondary and unspecified malignant neoplasm of lymph nodes of multiple regions: Secondary | ICD-10-CM | POA: Diagnosis not present

## 2018-04-27 DIAGNOSIS — C7889 Secondary malignant neoplasm of other digestive organs: Secondary | ICD-10-CM | POA: Diagnosis not present

## 2018-04-27 DIAGNOSIS — Z7984 Long term (current) use of oral hypoglycemic drugs: Secondary | ICD-10-CM | POA: Diagnosis not present

## 2018-04-27 DIAGNOSIS — C50912 Malignant neoplasm of unspecified site of left female breast: Secondary | ICD-10-CM | POA: Diagnosis not present

## 2018-04-27 DIAGNOSIS — C787 Secondary malignant neoplasm of liver and intrahepatic bile duct: Secondary | ICD-10-CM | POA: Diagnosis not present

## 2018-04-27 DIAGNOSIS — C50919 Malignant neoplasm of unspecified site of unspecified female breast: Secondary | ICD-10-CM

## 2018-04-27 DIAGNOSIS — R5383 Other fatigue: Secondary | ICD-10-CM | POA: Diagnosis not present

## 2018-04-27 DIAGNOSIS — Z9221 Personal history of antineoplastic chemotherapy: Secondary | ICD-10-CM | POA: Diagnosis not present

## 2018-04-27 LAB — CBC WITH DIFFERENTIAL (CANCER CENTER ONLY)
BASOS ABS: 0 10*3/uL (ref 0.0–0.1)
Basophils Relative: 1 %
EOS PCT: 6 %
Eosinophils Absolute: 0.3 10*3/uL (ref 0.0–0.5)
HCT: 32.5 % — ABNORMAL LOW (ref 34.8–46.6)
HEMOGLOBIN: 10.6 g/dL — AB (ref 11.6–15.9)
LYMPHS PCT: 12 %
Lymphs Abs: 0.7 10*3/uL — ABNORMAL LOW (ref 0.9–3.3)
MCH: 33.1 pg (ref 25.1–34.0)
MCHC: 32.6 g/dL (ref 31.5–36.0)
MCV: 101.6 fL — AB (ref 79.5–101.0)
Monocytes Absolute: 0.5 10*3/uL (ref 0.1–0.9)
Monocytes Relative: 10 %
NEUTROS ABS: 4.1 10*3/uL (ref 1.5–6.5)
NEUTROS PCT: 71 %
PLATELETS: 280 10*3/uL (ref 145–400)
RBC: 3.2 MIL/uL — AB (ref 3.70–5.45)
RDW: 14.7 % — ABNORMAL HIGH (ref 11.2–14.5)
WBC: 5.6 10*3/uL (ref 3.9–10.3)

## 2018-04-27 LAB — CMP (CANCER CENTER ONLY)
ALT: 63 U/L — ABNORMAL HIGH (ref 0–44)
ANION GAP: 8 (ref 5–15)
AST: 45 U/L — ABNORMAL HIGH (ref 15–41)
Albumin: 3.2 g/dL — ABNORMAL LOW (ref 3.5–5.0)
Alkaline Phosphatase: 394 U/L — ABNORMAL HIGH (ref 38–126)
BILIRUBIN TOTAL: 3.3 mg/dL — AB (ref 0.3–1.2)
BUN: 8 mg/dL (ref 8–23)
CO2: 28 mmol/L (ref 22–32)
Calcium: 9 mg/dL (ref 8.9–10.3)
Chloride: 104 mmol/L (ref 98–111)
Creatinine: 0.79 mg/dL (ref 0.44–1.00)
Glucose, Bld: 134 mg/dL — ABNORMAL HIGH (ref 70–99)
POTASSIUM: 4.2 mmol/L (ref 3.5–5.1)
Sodium: 140 mmol/L (ref 135–145)
TOTAL PROTEIN: 6.8 g/dL (ref 6.5–8.1)

## 2018-04-29 ENCOUNTER — Telehealth: Payer: Self-pay

## 2018-04-29 ENCOUNTER — Telehealth: Payer: Self-pay | Admitting: Hematology and Oncology

## 2018-04-29 NOTE — Telephone Encounter (Signed)
Scheduled appt per 9/25 sch message - pt is aware of appt date and time.   

## 2018-04-29 NOTE — Telephone Encounter (Signed)
Called pt to confirm her appt for 05/06/18. Pt confirmed lab/fl/md/inf.

## 2018-05-04 ENCOUNTER — Other Ambulatory Visit: Payer: Self-pay | Admitting: Hematology and Oncology

## 2018-05-04 DIAGNOSIS — C50919 Malignant neoplasm of unspecified site of unspecified female breast: Secondary | ICD-10-CM

## 2018-05-05 ENCOUNTER — Other Ambulatory Visit: Payer: Self-pay

## 2018-05-05 DIAGNOSIS — C50919 Malignant neoplasm of unspecified site of unspecified female breast: Secondary | ICD-10-CM

## 2018-05-06 ENCOUNTER — Inpatient Hospital Stay: Payer: Medicare Other

## 2018-05-06 ENCOUNTER — Telehealth: Payer: Self-pay | Admitting: Hematology and Oncology

## 2018-05-06 ENCOUNTER — Telehealth: Payer: Self-pay

## 2018-05-06 ENCOUNTER — Inpatient Hospital Stay: Payer: Medicare Other | Attending: Hematology and Oncology

## 2018-05-06 ENCOUNTER — Inpatient Hospital Stay: Payer: Medicare Other | Admitting: Hematology and Oncology

## 2018-05-06 VITALS — BP 139/70 | HR 101 | Temp 98.0°F | Resp 18 | Ht 65.0 in | Wt 152.8 lb

## 2018-05-06 VITALS — HR 97

## 2018-05-06 DIAGNOSIS — C50912 Malignant neoplasm of unspecified site of left female breast: Secondary | ICD-10-CM | POA: Insufficient documentation

## 2018-05-06 DIAGNOSIS — C787 Secondary malignant neoplasm of liver and intrahepatic bile duct: Secondary | ICD-10-CM

## 2018-05-06 DIAGNOSIS — Z170 Estrogen receptor positive status [ER+]: Secondary | ICD-10-CM | POA: Insufficient documentation

## 2018-05-06 DIAGNOSIS — Z7984 Long term (current) use of oral hypoglycemic drugs: Secondary | ICD-10-CM

## 2018-05-06 DIAGNOSIS — K811 Chronic cholecystitis: Secondary | ICD-10-CM | POA: Diagnosis not present

## 2018-05-06 DIAGNOSIS — G629 Polyneuropathy, unspecified: Secondary | ICD-10-CM

## 2018-05-06 DIAGNOSIS — C50919 Malignant neoplasm of unspecified site of unspecified female breast: Secondary | ICD-10-CM

## 2018-05-06 DIAGNOSIS — Z5112 Encounter for antineoplastic immunotherapy: Secondary | ICD-10-CM | POA: Diagnosis not present

## 2018-05-06 DIAGNOSIS — Z79899 Other long term (current) drug therapy: Secondary | ICD-10-CM | POA: Insufficient documentation

## 2018-05-06 DIAGNOSIS — Z9221 Personal history of antineoplastic chemotherapy: Secondary | ICD-10-CM | POA: Diagnosis not present

## 2018-05-06 DIAGNOSIS — C7931 Secondary malignant neoplasm of brain: Secondary | ICD-10-CM | POA: Diagnosis not present

## 2018-05-06 LAB — CMP (CANCER CENTER ONLY)
ALT: 19 U/L (ref 0–44)
ANION GAP: 10 (ref 5–15)
AST: 32 U/L (ref 15–41)
Albumin: 3.5 g/dL (ref 3.5–5.0)
Alkaline Phosphatase: 256 U/L — ABNORMAL HIGH (ref 38–126)
BILIRUBIN TOTAL: 2.4 mg/dL — AB (ref 0.3–1.2)
BUN: 10 mg/dL (ref 8–23)
CO2: 26 mmol/L (ref 22–32)
Calcium: 9.1 mg/dL (ref 8.9–10.3)
Chloride: 103 mmol/L (ref 98–111)
Creatinine: 0.85 mg/dL (ref 0.44–1.00)
GFR, Est AFR Am: >60 mL/min (ref >60)
GLUCOSE: 116 mg/dL — AB (ref 70–99)
POTASSIUM: 3.9 mmol/L (ref 3.5–5.1)
Sodium: 139 mmol/L (ref 135–145)
TOTAL PROTEIN: 7 g/dL (ref 6.5–8.1)

## 2018-05-06 LAB — CBC WITH DIFFERENTIAL (CANCER CENTER ONLY)
Basophils Absolute: 0.1 10*3/uL (ref 0.0–0.1)
Basophils Relative: 1 %
EOS ABS: 0.1 10*3/uL (ref 0.0–0.5)
EOS PCT: 3 %
HCT: 33.1 % — ABNORMAL LOW (ref 34.8–46.6)
Hemoglobin: 11.4 g/dL — ABNORMAL LOW (ref 11.6–15.9)
Lymphocytes Relative: 16 %
Lymphs Abs: 0.7 10*3/uL — ABNORMAL LOW (ref 0.9–3.3)
MCH: 34.2 pg — ABNORMAL HIGH (ref 25.1–34.0)
MCHC: 34.2 g/dL (ref 31.5–36.0)
MCV: 99.9 fL (ref 79.5–101.0)
MONO ABS: 0.4 10*3/uL (ref 0.1–0.9)
MONOS PCT: 9 %
NEUTROS ABS: 3.2 10*3/uL (ref 1.5–6.5)
Neutrophils Relative %: 71 %
PLATELETS: 222 10*3/uL (ref 145–400)
RBC: 3.32 MIL/uL — ABNORMAL LOW (ref 3.70–5.45)
RDW: 14.7 % — AB (ref 11.2–14.5)
WBC Count: 4.4 10*3/uL (ref 3.9–10.3)

## 2018-05-06 MED ORDER — HEPARIN SOD (PORK) LOCK FLUSH 100 UNIT/ML IV SOLN
500.0000 [IU] | Freq: Once | INTRAVENOUS | Status: AC | PRN
  Administered 2018-05-06: 500 [IU]
  Filled 2018-05-06: qty 5

## 2018-05-06 MED ORDER — TRASTUZUMAB CHEMO 150 MG IV SOLR
450.0000 mg | Freq: Once | INTRAVENOUS | Status: AC
  Administered 2018-05-06: 450 mg via INTRAVENOUS
  Filled 2018-05-06: qty 21.43

## 2018-05-06 MED ORDER — SODIUM CHLORIDE 0.9 % IV SOLN
Freq: Once | INTRAVENOUS | Status: AC
  Administered 2018-05-06: 09:00:00 via INTRAVENOUS
  Filled 2018-05-06: qty 250

## 2018-05-06 MED ORDER — DIPHENHYDRAMINE HCL 25 MG PO CAPS
ORAL_CAPSULE | ORAL | Status: AC
  Filled 2018-05-06: qty 2

## 2018-05-06 MED ORDER — SODIUM CHLORIDE 0.9% FLUSH
10.0000 mL | INTRAVENOUS | Status: DC | PRN
  Administered 2018-05-06: 10 mL
  Filled 2018-05-06: qty 10

## 2018-05-06 MED ORDER — ACETAMINOPHEN 325 MG PO TABS
650.0000 mg | ORAL_TABLET | Freq: Once | ORAL | Status: AC
  Administered 2018-05-06: 650 mg via ORAL

## 2018-05-06 MED ORDER — ACETAMINOPHEN 325 MG PO TABS
ORAL_TABLET | ORAL | Status: AC
  Filled 2018-05-06: qty 2

## 2018-05-06 MED ORDER — DIPHENHYDRAMINE HCL 25 MG PO CAPS
50.0000 mg | ORAL_CAPSULE | Freq: Once | ORAL | Status: AC
  Administered 2018-05-06: 50 mg via ORAL

## 2018-05-06 NOTE — Telephone Encounter (Signed)
Spoke with patient regarding appointment for MRI.    Patient notified of apt for 05/07/2018 @ 5pm.  Patient aware to arrive at 4pm at address of [address]    No further needs at this time.

## 2018-05-06 NOTE — Assessment & Plan Note (Signed)
Metastatic breast cancer with innumerable liver metastases detected on ultrasound and recent MRI of the liver on 04/26/2016 (Prior history of left breast IDC T1 1 N0 stage IA 0.3 cm grade 1 tumor that was ER 38%, PR 93%, Ki-67 10%, HER-2 2+, no tissue for FISH testing, 0/1 lymph node negative, status post lumpectomy radiation and 5 years of Arimidex completed in February 2013)  PET/CT scan: 05/07/2016: Extensive metastatic involvement of both lobes of the liver, primary could be liver, phalangeal or metastatic disease, hypermetabolic thoracic lymph nodes left axillary, left supraclavicular and right CP angle lymph nodes, hypermetabolic upper abdominal lymph nodes  Liver biopsy10/07/2016: Metastatic carcinoma breast primary strong positivity for CK 7 weak focal positivity for ER, GCDFP, negative for PR, CDX 2, TTF-1, WT 1; HER-2 positive  Treatment plan: 1. Taxotere Herceptin and Perjeta every 3 weeks palliative chemotherapy started 06/13/16 (Perjeta discontinued after 5 cycles due to allergy) 2. followed by Herceptin maintenance  ----------------------------------------------------------------------------------------------------------------------------------------- Current Treatment:Herceptin maintenance Peripheral neuropathy: Not bothering her, Blindness that was transient: on81 mg aspirin daily  Chronic cholecystitis: 03/24/2018 diagnostic laparoscopy: Due to intermittent attacks of biliary colic: The gallbladder was rockhard and not distensible with tumor palpated in the porta hepatis, extensive lucent liver lesions, decision was made to stop the surgery.   Systemic treatment discussion: I recommended switching her from Herceptin to Kadcyla from next week from next cycle.  I counseled extensively about cancer risks and benefits.

## 2018-05-06 NOTE — Patient Instructions (Signed)
Implanted Port Home Guide An implanted port is a type of central line that is placed under the skin. Central lines are used to provide IV access when treatment or nutrition needs to be given through a person's veins. Implanted ports are used for long-term IV access. An implanted port may be placed because:  You need IV medicine that would be irritating to the small veins in your hands or arms.  You need long-term IV medicines, such as antibiotics.  You need IV nutrition for a long period.  You need frequent blood draws for lab tests.  You need dialysis.  Implanted ports are usually placed in the chest area, but they can also be placed in the upper arm, the abdomen, or the leg. An implanted port has two main parts:  Reservoir. The reservoir is round and will appear as a small, raised area under your skin. The reservoir is the part where a needle is inserted to give medicines or draw blood.  Catheter. The catheter is a thin, flexible tube that extends from the reservoir. The catheter is placed into a large vein. Medicine that is inserted into the reservoir goes into the catheter and then into the vein.  How will I care for my incision site? Do not get the incision site wet. Bathe or shower as directed by your health care provider. How is my port accessed? Special steps must be taken to access the port:  Before the port is accessed, a numbing cream can be placed on the skin. This helps numb the skin over the port site.  Your health care provider uses a sterile technique to access the port. ? Your health care provider must put on a mask and sterile gloves. ? The skin over your port is cleaned carefully with an antiseptic and allowed to dry. ? The port is gently pinched between sterile gloves, and a needle is inserted into the port.  Only "non-coring" port needles should be used to access the port. Once the port is accessed, a blood return should be checked. This helps ensure that the port  is in the vein and is not clogged.  If your port needs to remain accessed for a constant infusion, a clear (transparent) bandage will be placed over the needle site. The bandage and needle will need to be changed every week, or as directed by your health care provider.  Keep the bandage covering the needle clean and dry. Do not get it wet. Follow your health care provider's instructions on how to take a shower or bath while the port is accessed.  If your port does not need to stay accessed, no bandage is needed over the port.  What is flushing? Flushing helps keep the port from getting clogged. Follow your health care provider's instructions on how and when to flush the port. Ports are usually flushed with saline solution or a medicine called heparin. The need for flushing will depend on how the port is used.  If the port is used for intermittent medicines or blood draws, the port will need to be flushed: ? After medicines have been given. ? After blood has been drawn. ? As part of routine maintenance.  If a constant infusion is running, the port may not need to be flushed.  How long will my port stay implanted? The port can stay in for as long as your health care provider thinks it is needed. When it is time for the port to come out, surgery will be   done to remove it. The procedure is similar to the one performed when the port was put in. When should I seek immediate medical care? When you have an implanted port, you should seek immediate medical care if:  You notice a bad smell coming from the incision site.  You have swelling, redness, or drainage at the incision site.  You have more swelling or pain at the port site or the surrounding area.  You have a fever that is not controlled with medicine.  This information is not intended to replace advice given to you by your health care provider. Make sure you discuss any questions you have with your health care provider. Document  Released: 07/22/2005 Document Revised: 12/28/2015 Document Reviewed: 03/29/2013 Elsevier Interactive Patient Education  2017 [employer]  

## 2018-05-06 NOTE — Patient Instructions (Signed)
[facility] Discharge Instructions for Patients Receiving Chemotherapy  Today you received the following chemotherapy agents Herceptin  To help prevent nausea and vomiting after your treatment, we encourage you to take your nausea medication as directed   If you develop nausea and vomiting that is not controlled by your nausea medication, call the clinic.   BELOW ARE SYMPTOMS THAT SHOULD BE REPORTED IMMEDIATELY:  *FEVER GREATER THAN 100.5 F  *CHILLS WITH OR WITHOUT FEVER  NAUSEA AND VOMITING THAT IS NOT CONTROLLED WITH YOUR NAUSEA MEDICATION  *UNUSUAL SHORTNESS OF BREATH  *UNUSUAL BRUISING OR BLEEDING  TENDERNESS IN MOUTH AND THROAT WITH OR WITHOUT PRESENCE OF ULCERS  *URINARY PROBLEMS  *BOWEL PROBLEMS  UNUSUAL RASH Items with * indicate a potential emergency and should be followed up as soon as possible.  Feel free to call the clinic should you have any questions or concerns. The clinic phone number is [phone].  Please show the CHEMO ALERT CARD at check-in to the Emergency Department and triage nurse.   

## 2018-05-06 NOTE — Progress Notes (Signed)
Patient Care Team: [name], MD as PCP - General (Internal Medicine)  DIAGNOSIS:  Encounter Diagnosis  Name Primary?  . Metastatic breast cancer ([place]) Yes    SUMMARY OF ONCOLOGIC HISTORY:   Metastatic breast cancer ([place])   06/05/2006 Initial Diagnosis    Left breast biopsy: DCIS with apocrine features ER 30%, PR 13%    06/19/2006 Surgery    Left lumpectomy: stage I, pT1a, pN0(i)(sn), pMX, 0.3 cm IDC, grade 1, with extensive high-grade DCIS , margins negative ER 38 %, PR 93 %, Ki-67 10%, HER-2/neu 2+, no additional tissue available for FISH testing, with 0/1 left axillary lymph nodes.    07/30/2006 - 09/17/2006 Radiation Therapy    Adjuvant radiation therapy    09/24/2006 - 09/25/2011 Anti-estrogen oral therapy    Antiestrogen therapy with Arimidex 5 years    04/26/2016 Relapse/Recurrence    Innumerable hepatic lesions seen in both lobes most are 1-2 cm size somewhat confluent largest in the posterior right hepatic dome 3 x 4.6 cm    05/07/2016 PET scan    PET/CT scan: Extensive metastatic involvement of both lobes of the liver, primary could be liver, phalangeal or metastatic disease, hypermetabolic thoracic LN left axillary, left supraclavicular and right CP angle LN, hypermetabolic upper abdominal LN    05/16/2016 Initial Biopsy    Liver biopsy: Metastatic carcinoma breast primary strong positivity for CK 7 weak focal positivity for ER, GCDFP, negative for PR, CDX 2, TTF-1, WT 1; HER-2 positive    06/13/2016 - 11/07/2016 Chemotherapy    Taxotere Herceptin and Perjeta every 3 weeks (Taxotere was held for elevated LFTs for cycles 1 and 2)     11/28/2016 -  Chemotherapy    Herceptin maintenance therapy for metastatic breast cancer every 3 weeks     03/24/2018 Surgery    Chronic cholecystitis: Diagnostic laparoscopy: Due to intermittent attacks of biliary colic: The gallbladder was rockhard and not distensible with tumor palpated in the porta hepatis, extensive lucent liver  lesions, decision was made to stop the surgery.     CHIEF COMPLIANT: Follow-up on Herceptin treatment, obstructive jaundice is improving  INTERVAL HISTORY: Monique Figueroa is a 84 year old with above-mentioned some metastatic breast cancer who is been on Herceptin maintenance therapy.  We have noticed on scans that her cancer was progressing.  She had symptoms of chronic cholecystitis and underwent surgery but the tumor was rocksolid and it could not be removed from the gallbladder bed.  She then subsequently developed obstructive jaundice and ERCP and stent was placed.  This led to marked improvement in her bilirubin levels.  Today she is here accompanied by her son for Herceptin and to discuss the subsequent treatment plan.  She is complaining of profound headaches as well as unsteadiness in the gait.  She also complains of feeling of fullness in the right ear  REVIEW OF SYSTEMS:   Constitutional: Denies fevers, chills or abnormal weight loss Eyes: Denies blurriness of vision Ears, nose, mouth, throat, and face: Feeling of fullness in the right ear Respiratory: Denies cough, dyspnea or wheezes Cardiovascular: Denies palpitation, chest discomfort Gastrointestinal:  Denies nausea, heartburn or change in bowel habits Skin: Slightly jaundiced Lymphatics: Denies new lymphadenopathy or easy bruising Neurological profound headaches and unsteadiness in the gait Behavioral/Psych: Mood is stable, no new changes  Extremities: No lower extremity edema   All other systems were reviewed with the patient and are negative.  I have reviewed the past medical history, past surgical history, social history and  family history with the patient and they are unchanged from previous note.  ALLERGIES:  is allergic to perjeta [pertuzumab] and sulfa antibiotics.  MEDICATIONS:  Current Outpatient Medications  Medication Sig Dispense Refill  . acetaminophen (TYLENOL) 325 MG tablet Take 325 mg by mouth daily as  needed for moderate pain or headache.     . benzocaine (ORAJEL) 10 % mucosal gel Use as directed 1 application in the mouth or throat as needed for mouth pain.    . calcium carbonate (TUMS - DOSED IN MG ELEMENTAL CALCIUM) 500 MG chewable tablet Chew 1 tablet by mouth daily as needed for indigestion or heartburn.    . Cholecalciferol (VITAMIN D) 2000 units tablet Take 2,000 Units by mouth daily.    . diphenhydrAMINE (BENADRYL) 25 MG tablet Take 25 mg by mouth daily as needed for allergies.    Marland Kitchen levothyroxine (SYNTHROID, LEVOTHROID) 125 MCG tablet Take 125 mcg by mouth daily before breakfast.     . metFORMIN (GLUCOPHAGE) 500 MG tablet Take 500 mg by mouth daily with breakfast.     . omeprazole (PRILOSEC OTC) 20 MG tablet Take 20 mg by mouth daily as needed (acid reflux).     . ondansetron (ZOFRAN) 4 MG tablet Take 1 tablet (4 mg total) by mouth daily as needed for nausea or vomiting. 30 tablet 3  . ondansetron (ZOFRAN-ODT) 8 MG disintegrating tablet Take 1 tablet (8 mg total) by mouth every 8 (eight) hours as needed for nausea or vomiting. 30 tablet 3  . oxyCODONE-acetaminophen (PERCOCET) 5-325 MG tablet Take 1 tablet by mouth every 4 (four) hours as needed for moderate pain. 30 tablet 0  . prochlorperazine (COMPAZINE) 10 MG tablet TAKE 1 TABLET (10 MG TOTAL) BY MOUTH EVERY 6 (SIX) HOURS AS NEEDED (NAUSEA OR VOMITING). 30 tablet 1  . prochlorperazine (COMPAZINE) 25 MG suppository Place 1 suppository (25 mg total) rectally every 12 (twelve) hours as needed for nausea or vomiting. 12 suppository 0  . Propylene Glycol (SYSTANE COMPLETE) 0.6 % SOLN Place 1 drop into both eyes 2 (two) times daily.    . sodium chloride (OCEAN) 0.65 % SOLN nasal spray Place 1 spray into both nostrils as needed for congestion.     No current facility-administered medications for this visit.    Facility-Administered Medications Ordered in Other Visits  Medication Dose Route Frequency Provider Last Rate Last Dose  . sodium  chloride flush (NS) 0.9 % injection 10 mL  10 mL Intracatheter PRN Nicholas Lose, MD   10 mL at 05/06/18 1017    PHYSICAL EXAMINATION: ECOG PERFORMANCE STATUS: 2 - Symptomatic, <50% confined to bed  Vitals:   05/06/18 0814  BP: 139/70  Pulse: (!) 101  Resp: 18  Temp: 98 F (36.7 C)  SpO2: 99%   Filed Weights   05/06/18 0814  Weight: 152 lb 12.8 oz (69.3 kg)    GENERAL:alert, no distress and comfortable SKIN: Slight jaundice EYES: normal, Conjunctiva are pink and non-injected, sclera clear OROPHARYNX:no exudate, no erythema and lips, buccal mucosa, and tongue normal  NECK: supple, thyroid normal size, non-tender, without nodularity LYMPH:  no palpable lymphadenopathy in the cervical, axillary or inguinal LUNGS: clear to auscultation and percussion with normal breathing effort HEART: regular rate & rhythm and no murmurs and no lower extremity edema ABDOMEN:abdomen soft, non-tender and normal bowel sounds MUSCULOSKELETAL:no cyanosis of digits and no clubbing  NEURO: alert & oriented x 3 with fluent speech, no focal motor/sensory deficits EXTREMITIES: No lower extremity edema  LABORATORY DATA:  I have reviewed the data as listed CMP Latest Ref Rng & Units 05/06/2018 04/27/2018 04/16/2018  Glucose 70 - 99 mg/dL 116(H) 134(H) 155(H)  BUN 8 - 23 mg/dL _0 Creatinine 0.44 - 1.00 mg/dL 0.85 0.79 0.79  Sodium 135 - 145 mmol/L 139 140 136  Potassium 3.5 - 5.1 mmol/L 3.9 4.2 3.2(L)  Chloride 98 - 111 mmol/L 103 104 97(L)  CO2 22 - 32 mmol/L _1 Calcium 8.9 - 10.3 mg/dL 9.1 9.0 9.7  Total Protein 6.5 - 8.1 g/dL 7.0 6.8 7.2  Total Bilirubin 0.3 - 1.2 mg/dL 2.4(H) 3.3(H) 10.8(HH)  Alkaline Phos 38 - 126 U/L 256(H) 394(H) 827(H)  AST 15 - 41 U/L 32 45(H) 357(HH)  ALT 0 - 44 U/L 19 63(H) 333(HH)    Lab Results  Component Value Date   WBC 4.4 05/06/2018   HGB 11.4 (L) 05/06/2018   HCT 33.1 (L) 05/06/2018   MCV 99.9 05/06/2018   PLT 222 05/06/2018   NEUTROABS 3.2  05/06/2018    ASSESSMENT & PLAN:  Metastatic breast cancer ([place]) Metastatic breast cancer with innumerable liver metastases detected on ultrasound and recent MRI of the liver on 04/26/2016 (Prior history of left breast IDC T1 1 N0 stage IA 0.3 cm grade 1 tumor that was ER 38%, PR 93%, Ki-67 10%, HER-2 2+, no tissue for FISH testing, 0/1 lymph node negative, status post lumpectomy radiation and 5 years of Arimidex completed in February 2013)  PET/CT scan: 05/07/2016: Extensive metastatic involvement of both lobes of the liver, primary could be liver, phalangeal or metastatic disease, hypermetabolic thoracic lymph nodes left axillary, left supraclavicular and right CP angle lymph nodes, hypermetabolic upper abdominal lymph nodes  Liver biopsy10/07/2016: Metastatic carcinoma breast primary strong positivity for CK 7 weak focal positivity for ER, GCDFP, negative for PR, CDX 2, TTF-1, WT 1; HER-2 positive  Treatment plan: 1. Taxotere Herceptin and Perjeta every 3 weeks palliative chemotherapy started 06/13/16 (Perjeta discontinued after 5 cycles due to allergy) 2. followed by Herceptin maintenance  ----------------------------------------------------------------------------------------------------------------------------------------- Current Treatment:Herceptin maintenance Peripheral neuropathy: Not bothering her, Blindness that was transient: on81 mg aspirin daily  Headaches and fullness in the right ear and ataxia: I would like to obtain a stat brain MRI.  Systemic treatment discussion: I recommended switching her from Herceptin to Kadcyla from next cycle. I counseled extensively about cancer risks and benefits.   Orders Placed This Encounter  Procedures  . MR Brain W Wo Contrast    Standing Status:   Future    Standing Expiration Date:   05/06/2019    Order Specific Question:   ** REASON FOR EXAM (FREE TEXT)    Answer:   Severe headaches and Right ear clogging    Order Specific  Question:   If indicated for the ordered procedure, I authorize the administration of contrast media per Radiology protocol    Answer:   Yes    Order Specific Question:   What is the patient's sedation requirement?    Answer:   No Sedation    Order Specific Question:   Does the patient have a pacemaker or implanted devices?    Answer:   No    Order Specific Question:   Use SRS Protocol?    Answer:   No    Order Specific Question:   Radiology Contrast Protocol - do NOT remove file path    Answer:   [email or web address]    Order Specific Question:  Preferred imaging location?    Answer:   [facility] (table limit-350 lbs)   The patient has a good understanding of the overall plan. she agrees with it. she will call with any problems that may develop before the next visit here.   Harriette Ohara, MD 05/06/18

## 2018-05-06 NOTE — Telephone Encounter (Signed)
Gave avs and calendar asked nurse about moving day

## 2018-05-06 NOTE — Progress Notes (Signed)
DISCONTINUE ON PATHWAY REGIMEN - Breast     A cycle is every 21 days:     Pertuzumab        Dose Mod: None     Pertuzumab        Dose Mod: None     Trastuzumab        Dose Mod: None     Trastuzumab        Dose Mod: None     Docetaxel        Dose Mod: None  **Always confirm dose/schedule in your pharmacy ordering system**  REASON: Disease Progression PRIOR TREATMENT: [id number]: Docetaxel + Trastuzumab + Pertuzumab (THP) q21 Days Until Progression or Toxicity TREATMENT RESPONSE: Progressive Disease (PD)  START ON PATHWAY REGIMEN - Breast     A cycle is every 21 days:     Ado-trastuzumab emtansine   **Always confirm dose/schedule in your pharmacy ordering system**  Patient Characteristics: Distant Metastases or Locoregional Recurrent Disease - Unresected or Locally Advanced Unresectable Disease Progressing after Neoadjuvant and Local Therapies, HER2 Positive, ER Negative/Unknown, Chemotherapy, Second Line Therapeutic Status: Distant Metastases BRCA Mutation Status: Absent ER Status: Negative (-) HER2 Status: Positive (+) PR Status: Negative (-) Line of therapy: Second Line Intent of Therapy: Non-Curative / Palliative Intent, Discussed with Patient

## 2018-05-07 ENCOUNTER — Ambulatory Visit (HOSPITAL_COMMUNITY)
Admission: RE | Admit: 2018-05-07 | Discharge: 2018-05-07 | Disposition: A | Discharge status: Home / Self Care | Payer: Medicare Other | Source: Ambulatory Visit | Attending: Hematology and Oncology | Admitting: Hematology and Oncology

## 2018-05-07 ENCOUNTER — Other Ambulatory Visit: Payer: Self-pay | Admitting: Hematology

## 2018-05-07 DIAGNOSIS — Z853 Personal history of malignant neoplasm of breast: Secondary | ICD-10-CM | POA: Diagnosis not present

## 2018-05-07 DIAGNOSIS — C7931 Secondary malignant neoplasm of brain: Secondary | ICD-10-CM | POA: Diagnosis not present

## 2018-05-07 DIAGNOSIS — C50919 Malignant neoplasm of unspecified site of unspecified female breast: Secondary | ICD-10-CM | POA: Insufficient documentation

## 2018-05-07 MED ORDER — DEXAMETHASONE 4 MG PO TABS: 8.0000 mg | ORAL_TABLET | Freq: Two times a day (BID) | ORAL | 0 refills | Status: DC

## 2018-05-07 MED ORDER — GADOBUTROL 1 MMOL/ML IV SOLN
7.0000 mL | Freq: Once | INTRAVENOUS | Status: AC | PRN
  Administered 2018-05-07: 7 mL via INTRAVENOUS

## 2018-05-08 ENCOUNTER — Ambulatory Visit
Admission: RE | Admit: 2018-05-08 | Discharge: 2018-05-08 | Disposition: A | Discharge status: Home / Self Care | Payer: Medicare Other | Source: Ambulatory Visit | Attending: Radiation Oncology | Admitting: Radiation Oncology

## 2018-05-08 ENCOUNTER — Other Ambulatory Visit: Payer: Self-pay

## 2018-05-08 ENCOUNTER — Telehealth: Payer: Self-pay

## 2018-05-08 ENCOUNTER — Encounter: Payer: Self-pay | Admitting: Radiation Oncology

## 2018-05-08 VITALS — BP 139/77 | HR 96 | Temp 98.1°F | Resp 16 | Ht 65.0 in | Wt 153.8 lb

## 2018-05-08 DIAGNOSIS — Z882 Allergy status to sulfonamides status: Secondary | ICD-10-CM | POA: Diagnosis not present

## 2018-05-08 DIAGNOSIS — Z853 Personal history of malignant neoplasm of breast: Secondary | ICD-10-CM | POA: Insufficient documentation

## 2018-05-08 DIAGNOSIS — Z85850 Personal history of malignant neoplasm of thyroid: Secondary | ICD-10-CM | POA: Diagnosis not present

## 2018-05-08 DIAGNOSIS — C7931 Secondary malignant neoplasm of brain: Secondary | ICD-10-CM | POA: Insufficient documentation

## 2018-05-08 DIAGNOSIS — Z79899 Other long term (current) drug therapy: Secondary | ICD-10-CM | POA: Diagnosis not present

## 2018-05-08 DIAGNOSIS — Z9221 Personal history of antineoplastic chemotherapy: Secondary | ICD-10-CM | POA: Insufficient documentation

## 2018-05-08 DIAGNOSIS — Z7984 Long term (current) use of oral hypoglycemic drugs: Secondary | ICD-10-CM | POA: Diagnosis not present

## 2018-05-08 DIAGNOSIS — C7949 Secondary malignant neoplasm of other parts of nervous system: Principal | ICD-10-CM

## 2018-05-08 DIAGNOSIS — Z803 Family history of malignant neoplasm of breast: Secondary | ICD-10-CM | POA: Diagnosis not present

## 2018-05-08 DIAGNOSIS — Z7952 Long term (current) use of systemic steroids: Secondary | ICD-10-CM | POA: Diagnosis not present

## 2018-05-08 DIAGNOSIS — Z510 Encounter for antineoplastic radiation therapy: Secondary | ICD-10-CM | POA: Diagnosis not present

## 2018-05-08 DIAGNOSIS — Z923 Personal history of irradiation: Secondary | ICD-10-CM | POA: Diagnosis not present

## 2018-05-08 NOTE — Progress Notes (Signed)
Location/Histology of Brain Tumor: metastatic breast cancer  Patient presented with symptoms of:  Nausea and dizziness for months. Headaches that start in her neck radiate up behind her ears, left eye and top of her head x 2 weeks.  Past or anticipated interventions, if any, per neurosurgery: no  Past or anticipated interventions, if any, per medical oncology: Herceptin every three weeks.  Dose of Decadron, if applicable: prescribed decadron 8 mg bid by Burr Medico but not picked up yet  Recent neurologic symptoms, if any:   Seizures: no  Headaches: yes  Nausea: yes  Dizziness/ataxia: yes  Difficulty with hand coordination: no  Focal numbness/weakness: no  Visual deficits/changes: yes, increased blurry vision in left eye  Confusion/Memory deficits: yes, decline in short term memory  Painful bone metastases at present, if any: no  SAFETY ISSUES:  Prior radiation? yes, ten years ago for breast ca  Pacemaker/ICD? no  Possible current pregnancy? no  Is the patient on methotrexate? no  Additional Complaints / other details: 84 year old female. Widowed. Accompanied by son.

## 2018-05-08 NOTE — Telephone Encounter (Signed)
Spoke with patient informed her she should be receiving a call from Radiation/Oncology about coming in today to see Dr. Lisbeth Renshaw, a results of the Brain MRI, patient is aware, informed her to call us back by noon today if she has not heard from them.  She verbalized an understanding.

## 2018-05-08 NOTE — Progress Notes (Signed)
See progress note under physician encounter. 

## 2018-05-08 NOTE — Telephone Encounter (Signed)
-----   Message from Truitt Merle, MD sent at 05/08/2018  8:36 AM EDT ----- Monique Figueroa,  Please call pt and let her know that she needs come in to see rad/onc today. I have spoke with Dr. Lisbeth Renshaw. Her brain MRI showed metastasis, Dr. [name] is out of office today   Thanks   Krista Blue  ----- Message ----- From: Truitt Merle, MD Sent: 05/07/2018   6:14 PM EDT To: Tyler Pita, MD, Nicholas Lose, MD  Loleta Dicker,  I got a call from radiology that her brain MRI showed 5 cerebellar metastasis narrowing the fourth ventricle and obex, at risk for obstructive hydrocephalus. I know you saw her yesterday and she is symptomatic with headache and gait issue. But it does not sound too bad. So I called in dexa today and the radiologist will tell her.   I copied Catalina Antigua here, to get his thoughts on SBRT.   Please follow up tomorrow.   Let me know if you are concerned, and feels she needs a hospital admission.   Thanks  yan

## 2018-05-08 NOTE — Progress Notes (Signed)
  Radiation Oncology         [phone] ________________________________  Name: Monique Figueroa MRN: [id number]  Date: 05/08/2018  DOB: 06-10-1934  SIMULATION AND TREATMENT PLANNING NOTE    ICD-10-CM   1. Brain metastases ([place]) C79.31     DIAGNOSIS:  84 yo woman with 5 cerebellar brain metastases from breast cancer  NARRATIVE:  The patient was brought to the [facility].  Identity was confirmed.  All relevant records and images related to the planned course of therapy were reviewed.  The patient freely provided informed written consent to proceed with treatment after reviewing the details related to the planned course of therapy. The consent form was witnessed and verified by the simulation staff.  Then, the patient was set-up in a stable reproducible  supine position for radiation therapy.  CT images were obtained.  Surface markings were placed.  The CT images were loaded into the planning software.  Then the target and avoidance structures were contoured.  Treatment planning then occurred.  The radiation prescription was entered and confirmed.  Then, I designed and supervised the construction of a total of 3 medically necessary complex treatment devices, including a custom made thermoplastic mask used for immobilization and two complex multileaf collimators to cover the entire posterior fossa to include the cerebellum and blocking the cerebral cortices maximally, while shielding the eyes and face.  Each [facility] is independently created to account for beam divergence.  The right and left lateral fields will be treated with 6 MV X-rays.  I have requested : Isodose Plan.    PLAN:  The posterior fossa will be treated to 30 Gy in 10 fractions.  ________________________________  Sheral Apley Tammi Klippel, M.D.

## 2018-05-08 NOTE — Progress Notes (Addendum)
Radiation Oncology         [phone] ________________________________  Initial outpatient Consultation  Name: Monique Figueroa MRN: [id number]  Date of Service: 05/08/2018 DOB: 12-27-1933  CC:Kim, Jeneen Rinks, MD  Nicholas Lose, MD   REFERRING PHYSICIAN: Nicholas Lose, MD  DIAGNOSIS: 84 y.o. female with 5 new cerebellar brain metastases from Stage IV breast cancer.     ICD-10-CM   1. Brain metastases ([place]) C79.31     HISTORY OF PRESENT ILLNESS: Monique Figueroa is a 84 y.o. female seen for urgent consult at the request of Dr. [name]. She is accompanied by her son and primary care taker. She was initially diagnosed in 06/2006 with Stage I, [id number] invasive ductal carcinoma, grade 1, ER/PR positive, Her-2 positive with extensive high-grade DCIS of the left breast.  She underwent lumpectomy 06/19/06 followed by adjuvant radiotherapy from 07/30/06 - 09/17/06 with Dr. Elba Barman.  She completed 5 years of antiestrogen therapy with Arimidex from 09/2006 - 09/2011.  Unfortunately, she developed extensive liver metastases in 04/2016 with innumerable hepatic lesions throughout both lobes.  She was treated with systemic chemotherapy with Taxotere, Herceptin and Perjeta from 06/2016 -  11/2016 followed by Herceptin maintenance therapy which she started on 11/28/2016 and has continued to tolerate well.  Recent follow-up scans had shown progression of disease and she developed symptoms of chronic cholecystitis.  At the time of surgery, the gallbladder was rocksolid and not distensible with tumor palpated in the porta hepatis, extensive lucent liver lesions.  The decision was made to stop the surgery.  She then subsequently developed obstructive jaundice and underwent ERCP for stent placement.  This led to marked improvement in her bilirubin levels.   She reports nausea, dry heaves, and dizziness for several months which she was attributing to her gallbladder and liver metastases. However, more recently on 05/06/18, she  presented with complaints of headaches spreading from her neck to behind her left eye, blurry vision in her left eye, unsteadiness of gait, fullness in the right ear and decline in short term memory present over the last 2 weeks. A brain MRI performed yesterday, 05/07/18, revealed 5 cerebellar metastases narrowing the fourth ventricle and obex, at risk for obstructive hydrocephalus.  A prescription for Decadron 8 mg p.o. twice daily was prescribed last night but the patient has not yet picked up the prescription from her pharmacy.  She presents today to discuss the potential role for radiotherapy in the management of her newly diagnosed brain metastases.  PREVIOUS RADIATION THERAPY: Yes 07/30/06 - 09/17/06: Breast, Left  PAST MEDICAL HISTORY:  Past Medical History:  Diagnosis Date  . Anemia   . Appendicitis   . Arthritis   . Biliary dyskinesia 03/24/2018  . Breast cancer ([place]) 06/2006   Invasive ductal and DCIS of left breast  . Breast tumor    History of bilateral breast tumors/cysts  . Cholecystitis   . Cyst of spinal meninges   . Diabetes mellitus without complication ([place])   . GERD (gastroesophageal reflux disease)   . Hypertension   . Hypothyroidism   . Metastatic breast cancer ([place]) 04/26/2016   mets to liver  . PONV (postoperative nausea and vomiting)   . Thyroid cancer ([place])   . Tuberculosis    medullary carcinoma ; denies TB   . UTI (urinary tract infection)    hx      PAST SURGICAL HISTORY: Past Surgical History:  Procedure Laterality Date  . APPENDECTOMY    . BALLOON DILATION N/A 04/20/2018  Procedure: BALLOON DILATION;  Surgeon: Irving Copas., MD;  Location: [place];  Service: Gastroenterology;  Laterality: N/A;  . BILIARY STENT PLACEMENT  04/20/2018   Procedure: BILIARY STENT PLACEMENT;  Surgeon: Rush Landmark Telford Nab., MD;  Location: [place];  Service: Gastroenterology;;  . BREAST CYST EXCISION Bilateral    Several asprirations and excisions   . BREAST LUMPECTOMY WITH AXILLARY LYMPH NODE BIOPSY Left 06/19/2006   Invasive ductal and in situ carcinoma, node negative  . CATARACT EXTRACTION, BILATERAL  2009  . CHOLECYSTECTOMY N/A 03/24/2018   Procedure: DIAGNOSTIC LAPAROSCOPY;  Surgeon: Fanny Skates, MD;  Location: [facility];  Service: General;  Laterality: N/A;  . ENDOSCOPIC RETROGRADE CHOLANGIOPANCREATOGRAPHY (ERCP) WITH PROPOFOL N/A 04/20/2018   Procedure: ENDOSCOPIC RETROGRADE CHOLANGIOPANCREATOGRAPHY (ERCP) WITH PROPOFOL ;  Surgeon: Irving Copas., MD;  Location: [place];  Service: Gastroenterology;  Laterality: N/A;  . PORTACATH PLACEMENT N/A 06/05/2016   Procedure: INSERTION PORT-A-CATH WITH [place];  Surgeon: Fanny Skates, MD;  Location: [place];  Service: General;  Laterality: N/A;  . SPHINCTEROTOMY  04/20/2018   Procedure: SPHINCTEROTOMY;  Surgeon: Mansouraty, Telford Nab., MD;  Location: [place];  Service: Gastroenterology;;  . [place]    . TOTAL THYROIDECTOMY      FAMILY HISTORY:  Family History  Problem Relation Age of Onset  . Rheum arthritis Mother   . Coronary artery disease Mother   . Dementia Mother   . Diabetes Mother   . Heart Problems Father   . Cancer Sister        Breast cancer  . Cancer Brother        Lung cancer  . Diabetes Sister   . Dementia Sister     SOCIAL HISTORY:  Social History   Socioeconomic History  . Marital status: Widowed    Spouse name: Not on file  . Number of children: 1  . Years of education: Not on file  . Highest education level: Not on file  Occupational History  . Not on file  Social Needs  . Financial resource strain: Not on file  . Food insecurity:    Worry: Not on file    Inability: Not on file  . Transportation needs:    Medical: Not on file    Non-medical: Not on file  Tobacco Use  . Smoking status: Never Smoker  . Smokeless tobacco: Never Used  Substance and Sexual Activity  . Alcohol use: No  . Drug use: No  .  Sexual activity: Not Currently    Birth control/protection: Post-menopausal  Lifestyle  . Physical activity:    Days per week: Not on file    Minutes per session: Not on file  . Stress: Not on file  Relationships  . Social connections:    Talks on phone: Not on file    Gets together: Not on file    Attends religious service: Not on file    Active member of club or organization: Not on file    Attends meetings of clubs or organizations: Not on file    Relationship status: Not on file  . Intimate partner violence:    Fear of current or ex partner: Not on file    Emotionally abused: Not on file    Physically abused: Not on file    Forced sexual activity: Not on file  Other Topics Concern  . Not on file  Social History Narrative  . Not on file    ALLERGIES: Perjeta [pertuzumab] and Sulfa antibiotics  MEDICATIONS:  Current Outpatient Medications  Medication Sig Dispense Refill  . acetaminophen (TYLENOL) 325 MG tablet Take 325 mg by mouth daily as needed for moderate pain or headache.     . calcium carbonate (TUMS - DOSED IN MG ELEMENTAL CALCIUM) 500 MG chewable tablet Chew 1 tablet by mouth daily as needed for indigestion or heartburn.    . Cholecalciferol (VITAMIN D) 2000 units tablet Take 2,000 Units by mouth daily.    Marland Kitchen levothyroxine (SYNTHROID, LEVOTHROID) 125 MCG tablet Take 125 mcg by mouth daily before breakfast.     . metFORMIN (GLUCOPHAGE) 500 MG tablet Take 500 mg by mouth daily with breakfast.     . omeprazole (PRILOSEC OTC) 20 MG tablet Take 20 mg by mouth daily as needed (acid reflux).     . ondansetron (ZOFRAN) 4 MG tablet Take 1 tablet (4 mg total) by mouth daily as needed for nausea or vomiting. 30 tablet 3  . ondansetron (ZOFRAN-ODT) 8 MG disintegrating tablet Take 1 tablet (8 mg total) by mouth every 8 (eight) hours as needed for nausea or vomiting. 30 tablet 3  . Propylene Glycol (SYSTANE COMPLETE) 0.6 % SOLN Place 1 drop into both eyes 2 (two) times daily.    .  sodium chloride (OCEAN) 0.65 % SOLN nasal spray Place 1 spray into both nostrils as needed for congestion.    . benzocaine (ORAJEL) 10 % mucosal gel Use as directed 1 application in the mouth or throat as needed for mouth pain.    Marland Kitchen dexamethasone (DECADRON) 4 MG tablet Take 2 tablets (8 mg total) by mouth 2 (two) times daily. (Patient not taking: Reported on 05/08/2018) 30 tablet 0  . diphenhydrAMINE (BENADRYL) 25 MG tablet Take 25 mg by mouth daily as needed for allergies.    Marland Kitchen oxyCODONE-acetaminophen (PERCOCET) 5-325 MG tablet Take 1 tablet by mouth every 4 (four) hours as needed for moderate pain. (Patient not taking: Reported on 05/08/2018) 30 tablet 0  . prochlorperazine (COMPAZINE) 10 MG tablet TAKE 1 TABLET (10 MG TOTAL) BY MOUTH EVERY 6 (SIX) HOURS AS NEEDED (NAUSEA OR VOMITING). (Patient not taking: Reported on 05/08/2018) 30 tablet 1  . prochlorperazine (COMPAZINE) 25 MG suppository Place 1 suppository (25 mg total) rectally every 12 (twelve) hours as needed for nausea or vomiting. (Patient not taking: Reported on 05/08/2018) 12 suppository 0   No current facility-administered medications for this encounter.     REVIEW OF SYSTEMS:  On review of systems, the patient reports that she is doing well overall. She denies any chest pain, shortness of breath, cough, fevers, chills, night sweats, unintended weight changes.  She has not had any focal weakness, paresthesias, tinnitus, or seizure activity.  She denies any bowel or bladder disturbances, and denies abdominal pain or vomiting. She denies any new musculoskeletal or joint aches or pains. She denies any speech issues. She reports trouble starting her stream with voiding but denies dysuria, gross hematuria or flank pain. A complete review of systems is obtained and is otherwise negative.    PHYSICAL EXAM:  Wt Readings from Last 3 Encounters:  05/08/18 153 lb 12.8 oz (69.8 kg)  05/06/18 152 lb 12.8 oz (69.3 kg)  04/16/18 149 lb 9.6 oz (67.9 kg)     Temp Readings from Last 3 Encounters:  05/08/18 98.1 F (36.7 C) (Oral)  05/06/18 98 F (36.7 C) (Oral)  04/20/18 98.5 F (36.9 C) (Oral)   BP Readings from Last 3 Encounters:  05/08/18 139/77  05/06/18 139/70  04/20/18 Marland Kitchen)  189/62   Pulse Readings from Last 3 Encounters:  05/08/18 96  05/06/18 97  05/06/18 (!) 101   Pain Assessment Pain Score: 0-No pain/10  In general this is a well appearing Caucasian woman in no acute distress. She is alert and oriented x4 and appropriate throughout the examination. HEENT reveals that the patient is normocephalic, atraumatic. EOMs are intact. PERRLA. Skin is intact without any evidence of gross lesions. Cardiovascular exam reveals a regular rate and rhythm, no clicks rubs or murmurs are auscultated. Chest is clear to auscultation bilaterally. Lymphatic assessment is performed and does not reveal any adenopathy in the cervical, supraclavicular, axillary, or inguinal chains. Abdomen has active bowel sounds in all quadrants and is intact. The abdomen is soft, non tender, non distended. Lower extremities are negative for deep calf tenderness, cyanosis or clubbing. 1+ pitting edema to bilateral lower extremities, L>R.    KPS = 90  100 - Normal; no complaints; no evidence of disease. 90   - Able to carry on normal activity; minor signs or symptoms of disease. 80   - Normal activity with effort; some signs or symptoms of disease. 64   - Cares for self; unable to carry on normal activity or to do active work. 60   - Requires occasional assistance, but is able to care for most of his personal needs. 50   - Requires considerable assistance and frequent medical care. 41   - Disabled; requires special care and assistance. 87   - Severely disabled; hospital admission is indicated although death not imminent. 24   - Very sick; hospital admission necessary; active supportive treatment necessary. 10   - Moribund; fatal processes progressing rapidly. 0     -  Dead  Karnofsky DA, Abelmann [place], Craver LS and Burchenal [facility] [phone]) The use of the nitrogen mustards in the palliative treatment of carcinoma: with particular reference to bronchogenic carcinoma Cancer 1 634-56  LABORATORY DATA:  Lab Results  Component Value Date   WBC 4.4 05/06/2018   HGB 11.4 (L) 05/06/2018   HCT 33.1 (L) 05/06/2018   MCV 99.9 05/06/2018   PLT 222 05/06/2018   Lab Results  Component Value Date   NA 139 05/06/2018   K 3.9 05/06/2018   CL 103 05/06/2018   CO2 26 05/06/2018   Lab Results  Component Value Date   ALT 19 05/06/2018   AST 32 05/06/2018   ALKPHOS 256 (H) 05/06/2018   BILITOT 2.4 (H) 05/06/2018     RADIOGRAPHY: Mr Jeri Cos OE Contrast  Addendum Date: 05/07/2018   ADDENDUM REPORT: 05/07/2018 18:17 ADDENDUM: Acute findings discussed with and reconfirmed by Elson Clan on 05/07/2018 at 6:17 pm. Electronically Signed   By: Elon Alas M.D.   On: 05/07/2018 18:17   Result Date: 05/07/2018 CLINICAL DATA:  Severe headaches and RIGHT ear clogging. History of metastatic breast cancer, hypertension. EXAM: MRI HEAD WITHOUT AND WITH CONTRAST TECHNIQUE: Multiplanar, multiecho pulse sequences of the brain and surrounding structures were obtained without and with intravenous contrast. CONTRAST:  7 cc Gadavist COMPARISON:  CT HEAD February 12, 2015 FINDINGS: INTRACRANIAL CONTENTS: 5 cerebellar avidly enhancing mass metastasis including 1.9 x 2.4 cm metastasis effacing the RIGHT foramen of [place]ka and the foramen image of [place]. Largest lesion is 2.2 x 2.6 cm RIGHT cerebellum. Extensive vasogenic edema narrowing the fourth ventricle. No hydrocephalus. No parenchymal brain volume loss for age. Patchy supratentorial FLAIR T2 hyperintensities with T2 bright cystic component. No supratentorial midline shift. Prominent basal ganglia and  thalami perivascular spaces associated chronic small vessel ischemic changes. No abnormal supratentorial enhancement. No reduced diffusion to  suggest acute ischemia. No susceptibility artifact to suggest hemorrhage. No abnormal extra-axial fluid collections. VASCULAR: Normal major intracranial vascular flow voids present at skull base. SKULL AND UPPER CERVICAL SPINE: No abnormal sellar expansion. No suspicious calvarial bone marrow signal. Mildly heterogeneous bone marrow signal without abnormal enhancement. Craniocervical junction maintained. SINUSES/ORBITS: The mastoid air-cells and included paranasal sinuses are well-aerated.The included ocular globes and orbital contents are non-suspicious. OTHER: None. IMPRESSION: 1. 5 cerebellar metastasis narrowing the fourth ventricle and obex, at risk for obstructive hydrocephalus. 2. Moderate chronic small vessel ischemic changes. Electronically Signed: By: Elon Alas M.D. On: 05/07/2018 17:52   Ct Abdomen Pelvis W Contrast  Result Date: 04/12/2018 CLINICAL DATA:  Metastatic breast cancer on chemotherapy. Nausea. Abdominal pain. EXAM: CT ABDOMEN AND PELVIS WITH CONTRAST TECHNIQUE: Multidetector CT imaging of the abdomen and pelvis was performed using the standard protocol following bolus administration of intravenous contrast. CONTRAST:  136m ISOVUE-300 IOPAMIDOL (ISOVUE-300) INJECTION 61% COMPARISON:  01/08/2018 CT abdomen/pelvis. FINDINGS: Lower chest: No significant pulmonary nodules or acute consolidative airspace disease. Right coronary atherosclerosis. Hepatobiliary: There is a heterogeneously enhancing 3.2 x 3.1 cm segment 6 right liver lobe mass (series 2/image 24), increased from 2.5 x 2.2 cm on 01/08/2018 CT. Heterogeneous hypoenhancing 6.4 x 5.3 cm focus in the segment 7 right liver lobe (series 2/image 16), previously 6.3 x 5.4 cm, not appreciably changed. Innumerable additional small hypodense foci scattered throughout the liver measuring up to 1.2 cm in the inferior left liver lobe (series 2/image 31) are unchanged. No new liver lesions. Stable nodular liver contour compatible with  pseudo cirrhosis. Cholecystectomy. Mild central intrahepatic biliary ductal dilatation is mildly increased. Normal caliber CBD (5 mm diameter). Pancreas: Normal, with no mass or duct dilation. Spleen: Normal size. No mass. Adrenals/Urinary Tract: Normal adrenals. Scattered subcentimeter hypodense right renal cortical lesions are too small to characterize and unchanged, considered benign. No new renal lesions. No hydronephrosis. Normal bladder. Stomach/Bowel: Small hiatal hernia. Otherwise normal nondistended stomach. Normal caliber small bowel with no small bowel wall thickening. Appendectomy. Marked left colonic diverticulosis, most prominent in the sigmoid colon, with no large bowel wall thickening or acute pericolonic fat stranding. Vascular/Lymphatic: Atherosclerotic nonaneurysmal abdominal aorta. Patent main portal, splenic, hepatic and renal veins. Chronic occlusion of the right portal vein. Stable mildly enlarged 1.0 cm left para-aortic node (series 2/image 27). Stable mildly enlarged 1.1 cm aortocaval node (series 2/image 30). No new pathologically enlarged lymph nodes in the abdomen or pelvis. Reproductive: No adnexal mass. Stable coarsely calcified small uterine fibroids. Other: No pneumoperitoneum, ascites or focal fluid collection. Musculoskeletal: No aggressive appearing focal osseous lesions. Marked lower lumbar degenerative disc disease. IMPRESSION: 1. Interval growth of segment 6 right liver lobe metastasis. Additional liver lesions are stable. Stable pseudo cirrhotic appearance of the liver. 2. Stable chronic occlusion of the right portal vein. 3. Stable retroperitoneal adenopathy. 4. Mild central intrahepatic biliary ductal dilatation is mildly increased. Normal caliber CBD. Developing malignant central biliary stricture is not excluded. Recommend correlation with serum bilirubin levels and continued attention on follow-up imaging. 5.  Aortic Atherosclerosis (ICD10-I70.0). Electronically Signed    By: JIlona SorrelM.D.   On: 04/12/2018 16:19   Dg Ercp  Result Date: 04/20/2018 CLINICAL DATA:  Common bile duct dilation. 11 min of FL time. EXAM: ERCP TECHNIQUE: Multiple spot images obtained with the fluoroscopic device and submitted for interpretation post-procedure. COMPARISON:  CT 04/10/2018  FINDINGS: A series of fluoroscopic spot images document endoscopic cannulation and opacification of the CBD. Focal areas of narrowing or filling defects at the biliary confluence and in the proximal CBD. There is mild dilatation of central intrahepatic ducts. Subsequent images document passage of a balloon catheter into the proximal CBD just distal to the confluence, and subsequent placement of a plastic biliary stent extending from above the confluence to the duodenum, with decompression of the biliary tree. IMPRESSION: Endoscopic CBD cannulation and intervention as above. These images were submitted for radiologic interpretation only. Please see the procedural report for the amount of contrast and the fluoroscopy time utilized. Electronically Signed   By: Lucrezia Europe M.D.   On: 04/20/2018 09:47      IMPRESSION/PLAN: 1. 84 y.o. female with 5 new cerebellar brain metastases from Stage IV breast cancer.  At this point, the patient would potentially benefit from radiotherapy. The options include whole brain irradiation, focused, hypo-fractionated radiotherapy versus stereotactic radiosurgery. There are pros and cons associated with each of these potential treatment options which we discussed today. Whole brain radiotherapy would treat the known metastatic deposits and help provide some reduction of risk for future brain metastases. However, whole brain radiotherapy carries potential risks including hair loss, subacute somnolence, and neurocognitive changes including a possible reduction in short-term memory. Whole brain radiotherapy also may carry a lower likelihood of tumor control at the treatment sites because of  the low-dose used. Focused, hypofractionated radiotherapy and stereotactic radiosurgery carry a higher likelihood for local tumor control at the targeted sites with lower associated risk for neurocognitive changes such as memory loss. However, the use of these treatments in this setting may leave the patient at increased risk for new brain metastases elsewhere in the brain as high as 50-60%. Accordingly, patients who receive focused, hypofractionated radiotherapy or stereotactic radiosurgery in this setting should undergo ongoing surveillance imaging with brain MRI more frequently in order to identify and treat new small brain metastases before they become symptomatic. Focused, hypofractionated radiotherapy or stereotactic radiosurgery do carry some different risks, including a risk of radionecrosis.  PLAN: Today, Dr. Tammi Klippel and I reviewed the findings and workup thus far with the patient. We discussed the dilemma regarding whole brain radiotherapy versus focused, hypofractionated radiotherapy versus stereotactic radiosurgery. We discussed the pros and cons of each. We also discussed the logistics and delivery of each. We reviewed the results associated with each of the treatments described above.  The patient and her son were encouraged to ask questions that were answered to their stated satisfaction.  She seems to understand the treatment options and would like to proceed with focused, hypofractionated radiotherapy delivered in 10 daily treatments over a course of 2 weeks.  She has freely signed written consent to proceed today in the office and will undergo CT simulation today in anticipation of beginning treatment on Monday, May 11, 2018.  She will pick up her prescription for Decadron 8 mg p.o. twice daily and begin taking this as prescribed.  We will plan to taper her off the steroids at the completion of her radiotherapy.  I spent 60 minutes minutes face to face with the patient and more than 50% of  that time was spent in counseling and/or coordination of care.    Nicholos Johns, PA-C    Tyler Pita, MD  [facility] Direct Dial: [phone]  Fax: [phone] [email or web address]  Skype  [email or web address]  This document serves as a record of services personally performed by  Tyler Pita, MD and Freeman Caldron, PA-C. It was created on their behalf by Wilburn Mylar, a trained medical scribe. The creation of this record is based on the scribe's personal observations and the provider's statements to them. This document has been checked and approved by the attending provider.

## 2018-05-11 ENCOUNTER — Ambulatory Visit
Admission: RE | Admit: 2018-05-11 | Discharge: 2018-05-11 | Disposition: A | Discharge status: Home / Self Care | Payer: Medicare Other | Source: Ambulatory Visit | Attending: Radiation Oncology | Admitting: Radiation Oncology

## 2018-05-11 ENCOUNTER — Ambulatory Visit (HOSPITAL_COMMUNITY): Payer: Medicare Other

## 2018-05-11 DIAGNOSIS — Z510 Encounter for antineoplastic radiation therapy: Secondary | ICD-10-CM | POA: Diagnosis not present

## 2018-05-11 DIAGNOSIS — C7931 Secondary malignant neoplasm of brain: Secondary | ICD-10-CM | POA: Diagnosis not present

## 2018-05-11 DIAGNOSIS — Z853 Personal history of malignant neoplasm of breast: Secondary | ICD-10-CM | POA: Diagnosis not present

## 2018-05-12 ENCOUNTER — Ambulatory Visit
Admission: RE | Admit: 2018-05-12 | Discharge: 2018-05-12 | Disposition: A | Discharge status: Home / Self Care | Payer: Medicare Other | Source: Ambulatory Visit | Attending: Radiation Oncology | Admitting: Radiation Oncology

## 2018-05-12 DIAGNOSIS — Z853 Personal history of malignant neoplasm of breast: Secondary | ICD-10-CM | POA: Diagnosis not present

## 2018-05-12 DIAGNOSIS — Z510 Encounter for antineoplastic radiation therapy: Secondary | ICD-10-CM | POA: Diagnosis not present

## 2018-05-12 DIAGNOSIS — C7931 Secondary malignant neoplasm of brain: Secondary | ICD-10-CM | POA: Diagnosis not present

## 2018-05-13 ENCOUNTER — Ambulatory Visit
Admission: RE | Admit: 2018-05-13 | Discharge: 2018-05-13 | Disposition: A | Discharge status: Home / Self Care | Payer: Medicare Other | Source: Ambulatory Visit | Attending: Radiation Oncology | Admitting: Radiation Oncology

## 2018-05-13 DIAGNOSIS — C7931 Secondary malignant neoplasm of brain: Secondary | ICD-10-CM | POA: Diagnosis not present

## 2018-05-13 DIAGNOSIS — Z510 Encounter for antineoplastic radiation therapy: Secondary | ICD-10-CM | POA: Diagnosis not present

## 2018-05-13 DIAGNOSIS — Z853 Personal history of malignant neoplasm of breast: Secondary | ICD-10-CM | POA: Diagnosis not present

## 2018-05-14 ENCOUNTER — Ambulatory Visit
Admission: RE | Admit: 2018-05-14 | Discharge: 2018-05-14 | Disposition: A | Discharge status: Home / Self Care | Payer: Medicare Other | Source: Ambulatory Visit | Attending: Radiation Oncology | Admitting: Radiation Oncology

## 2018-05-14 DIAGNOSIS — Z510 Encounter for antineoplastic radiation therapy: Secondary | ICD-10-CM | POA: Diagnosis not present

## 2018-05-14 DIAGNOSIS — C7931 Secondary malignant neoplasm of brain: Secondary | ICD-10-CM | POA: Diagnosis not present

## 2018-05-14 DIAGNOSIS — Z853 Personal history of malignant neoplasm of breast: Secondary | ICD-10-CM | POA: Diagnosis not present

## 2018-05-15 ENCOUNTER — Ambulatory Visit
Admission: RE | Admit: 2018-05-15 | Discharge: 2018-05-15 | Disposition: A | Discharge status: Home / Self Care | Payer: Medicare Other | Source: Ambulatory Visit | Attending: Radiation Oncology | Admitting: Radiation Oncology

## 2018-05-15 ENCOUNTER — Other Ambulatory Visit: Payer: Self-pay | Admitting: Urology

## 2018-05-15 DIAGNOSIS — Z853 Personal history of malignant neoplasm of breast: Secondary | ICD-10-CM | POA: Diagnosis not present

## 2018-05-15 DIAGNOSIS — C7931 Secondary malignant neoplasm of brain: Secondary | ICD-10-CM | POA: Diagnosis not present

## 2018-05-15 DIAGNOSIS — Z510 Encounter for antineoplastic radiation therapy: Secondary | ICD-10-CM | POA: Diagnosis not present

## 2018-05-15 MED ORDER — DEXAMETHASONE 4 MG PO TABS: 4.0000 mg | ORAL_TABLET | Freq: Three times a day (TID) | ORAL | 0 refills | Status: DC

## 2018-05-18 ENCOUNTER — Ambulatory Visit
Admission: RE | Admit: 2018-05-18 | Discharge: 2018-05-18 | Disposition: A | Discharge status: Home / Self Care | Payer: Medicare Other | Source: Ambulatory Visit | Attending: Radiation Oncology | Admitting: Radiation Oncology

## 2018-05-18 DIAGNOSIS — Z510 Encounter for antineoplastic radiation therapy: Secondary | ICD-10-CM | POA: Diagnosis not present

## 2018-05-18 DIAGNOSIS — C7931 Secondary malignant neoplasm of brain: Secondary | ICD-10-CM | POA: Diagnosis not present

## 2018-05-18 DIAGNOSIS — Z853 Personal history of malignant neoplasm of breast: Secondary | ICD-10-CM | POA: Diagnosis not present

## 2018-05-19 ENCOUNTER — Ambulatory Visit
Admission: RE | Admit: 2018-05-19 | Discharge: 2018-05-19 | Disposition: A | Discharge status: Home / Self Care | Payer: Medicare Other | Source: Ambulatory Visit | Attending: Radiation Oncology | Admitting: Radiation Oncology

## 2018-05-19 DIAGNOSIS — Z853 Personal history of malignant neoplasm of breast: Secondary | ICD-10-CM | POA: Diagnosis not present

## 2018-05-19 DIAGNOSIS — Z510 Encounter for antineoplastic radiation therapy: Secondary | ICD-10-CM | POA: Diagnosis not present

## 2018-05-19 DIAGNOSIS — C7931 Secondary malignant neoplasm of brain: Secondary | ICD-10-CM | POA: Diagnosis not present

## 2018-05-20 ENCOUNTER — Ambulatory Visit
Admission: RE | Admit: 2018-05-20 | Discharge: 2018-05-20 | Disposition: A | Discharge status: Home / Self Care | Payer: Medicare Other | Source: Ambulatory Visit | Attending: Radiation Oncology | Admitting: Radiation Oncology

## 2018-05-20 DIAGNOSIS — C7931 Secondary malignant neoplasm of brain: Secondary | ICD-10-CM | POA: Diagnosis not present

## 2018-05-20 DIAGNOSIS — Z853 Personal history of malignant neoplasm of breast: Secondary | ICD-10-CM | POA: Diagnosis not present

## 2018-05-20 DIAGNOSIS — Z510 Encounter for antineoplastic radiation therapy: Secondary | ICD-10-CM | POA: Diagnosis not present

## 2018-05-21 ENCOUNTER — Ambulatory Visit
Admission: RE | Admit: 2018-05-21 | Discharge: 2018-05-21 | Disposition: A | Discharge status: Home / Self Care | Payer: Medicare Other | Source: Ambulatory Visit | Attending: Radiation Oncology | Admitting: Radiation Oncology

## 2018-05-21 DIAGNOSIS — C7931 Secondary malignant neoplasm of brain: Secondary | ICD-10-CM | POA: Diagnosis not present

## 2018-05-21 DIAGNOSIS — Z853 Personal history of malignant neoplasm of breast: Secondary | ICD-10-CM | POA: Diagnosis not present

## 2018-05-21 DIAGNOSIS — Z510 Encounter for antineoplastic radiation therapy: Secondary | ICD-10-CM | POA: Diagnosis not present

## 2018-05-22 ENCOUNTER — Encounter: Payer: Self-pay | Admitting: Radiation Oncology

## 2018-05-22 ENCOUNTER — Ambulatory Visit
Admission: RE | Admit: 2018-05-22 | Discharge: 2018-05-22 | Disposition: A | Discharge status: Home / Self Care | Payer: Medicare Other | Source: Ambulatory Visit | Attending: Radiation Oncology | Admitting: Radiation Oncology

## 2018-05-22 DIAGNOSIS — C7931 Secondary malignant neoplasm of brain: Secondary | ICD-10-CM | POA: Diagnosis not present

## 2018-05-22 DIAGNOSIS — Z853 Personal history of malignant neoplasm of breast: Secondary | ICD-10-CM | POA: Diagnosis not present

## 2018-05-22 DIAGNOSIS — Z510 Encounter for antineoplastic radiation therapy: Secondary | ICD-10-CM | POA: Diagnosis not present

## 2018-05-27 ENCOUNTER — Ambulatory Visit: Payer: Medicare Other | Admitting: Hematology and Oncology

## 2018-05-27 ENCOUNTER — Other Ambulatory Visit: Payer: Medicare Other

## 2018-05-27 ENCOUNTER — Ambulatory Visit: Payer: Medicare Other

## 2018-05-29 ENCOUNTER — Inpatient Hospital Stay: Payer: Medicare Other

## 2018-05-29 ENCOUNTER — Inpatient Hospital Stay: Payer: Medicare Other | Admitting: Hematology and Oncology

## 2018-05-29 ENCOUNTER — Telehealth: Payer: Self-pay | Admitting: Hematology and Oncology

## 2018-05-29 DIAGNOSIS — C50919 Malignant neoplasm of unspecified site of unspecified female breast: Secondary | ICD-10-CM

## 2018-05-29 DIAGNOSIS — Z9221 Personal history of antineoplastic chemotherapy: Secondary | ICD-10-CM | POA: Diagnosis not present

## 2018-05-29 DIAGNOSIS — G629 Polyneuropathy, unspecified: Secondary | ICD-10-CM | POA: Diagnosis not present

## 2018-05-29 DIAGNOSIS — C50912 Malignant neoplasm of unspecified site of left female breast: Secondary | ICD-10-CM | POA: Diagnosis not present

## 2018-05-29 DIAGNOSIS — K811 Chronic cholecystitis: Secondary | ICD-10-CM | POA: Diagnosis not present

## 2018-05-29 DIAGNOSIS — Z79899 Other long term (current) drug therapy: Secondary | ICD-10-CM | POA: Diagnosis not present

## 2018-05-29 DIAGNOSIS — C7931 Secondary malignant neoplasm of brain: Secondary | ICD-10-CM | POA: Diagnosis not present

## 2018-05-29 DIAGNOSIS — C787 Secondary malignant neoplasm of liver and intrahepatic bile duct: Secondary | ICD-10-CM

## 2018-05-29 DIAGNOSIS — Z170 Estrogen receptor positive status [ER+]: Secondary | ICD-10-CM

## 2018-05-29 DIAGNOSIS — Z5112 Encounter for antineoplastic immunotherapy: Secondary | ICD-10-CM | POA: Diagnosis not present

## 2018-05-29 DIAGNOSIS — Z7984 Long term (current) use of oral hypoglycemic drugs: Secondary | ICD-10-CM

## 2018-05-29 DIAGNOSIS — Z95828 Presence of other vascular implants and grafts: Secondary | ICD-10-CM

## 2018-05-29 LAB — CBC WITH DIFFERENTIAL (CANCER CENTER ONLY)
Abs Immature Granulocytes: 0.08 10*3/uL — ABNORMAL HIGH (ref 0.00–0.07)
BASOS PCT: 0 %
Basophils Absolute: 0 10*3/uL (ref 0.0–0.1)
EOS ABS: 0 10*3/uL (ref 0.0–0.5)
Eosinophils Relative: 0 %
HEMATOCRIT: 37.2 % (ref 36.0–46.0)
Hemoglobin: 12.5 g/dL (ref 12.0–15.0)
Immature Granulocytes: 1 %
LYMPHS ABS: 0.4 10*3/uL — AB (ref 0.7–4.0)
Lymphocytes Relative: 5 %
MCH: 33.4 pg (ref 26.0–34.0)
MCHC: 33.6 g/dL (ref 30.0–36.0)
MCV: 99.5 fL (ref 80.0–100.0)
MONO ABS: 0.3 10*3/uL (ref 0.1–1.0)
MONOS PCT: 4 %
Neutro Abs: 7.6 10*3/uL (ref 1.7–7.7)
Neutrophils Relative %: 90 %
PLATELETS: 139 10*3/uL — AB (ref 150–400)
RBC: 3.74 MIL/uL — ABNORMAL LOW (ref 3.87–5.11)
RDW: 13.2 % (ref 11.5–15.5)
WBC Count: 8.4 10*3/uL (ref 4.0–10.5)
nRBC: 0 % (ref 0.0–0.2)

## 2018-05-29 LAB — CMP (CANCER CENTER ONLY)
ALT: 24 U/L (ref 0–44)
AST: 22 U/L (ref 15–41)
Albumin: 3 g/dL — ABNORMAL LOW (ref 3.5–5.0)
Alkaline Phosphatase: 111 U/L (ref 38–126)
Anion gap: 10 (ref 5–15)
BILIRUBIN TOTAL: 1.3 mg/dL — AB (ref 0.3–1.2)
BUN: 22 mg/dL (ref 8–23)
CALCIUM: 8.4 mg/dL — AB (ref 8.9–10.3)
CO2: 26 mmol/L (ref 22–32)
Chloride: 102 mmol/L (ref 98–111)
Creatinine: 0.71 mg/dL (ref 0.44–1.00)
Glucose, Bld: 124 mg/dL — ABNORMAL HIGH (ref 70–99)
Potassium: 3.9 mmol/L (ref 3.5–5.1)
Sodium: 138 mmol/L (ref 135–145)
Total Protein: 6 g/dL — ABNORMAL LOW (ref 6.5–8.1)

## 2018-05-29 MED ORDER — DIPHENHYDRAMINE HCL 25 MG PO CAPS
ORAL_CAPSULE | ORAL | Status: AC
  Filled 2018-05-29: qty 2

## 2018-05-29 MED ORDER — HEPARIN SOD (PORK) LOCK FLUSH 100 UNIT/ML IV SOLN
500.0000 [IU] | Freq: Once | INTRAVENOUS | Status: DC | PRN
  Filled 2018-05-29: qty 5

## 2018-05-29 MED ORDER — SODIUM CHLORIDE 0.9% FLUSH
10.0000 mL | INTRAVENOUS | Status: DC | PRN
  Administered 2018-05-29: 10 mL via INTRAVENOUS
  Filled 2018-05-29: qty 10

## 2018-05-29 MED ORDER — SODIUM CHLORIDE 0.9% FLUSH
10.0000 mL | INTRAVENOUS | Status: DC | PRN
  Filled 2018-05-29: qty 10

## 2018-05-29 MED ORDER — SODIUM CHLORIDE 0.9 % IV SOLN
3.0000 mg/kg | Freq: Once | INTRAVENOUS | Status: AC
  Administered 2018-05-29: 200 mg via INTRAVENOUS
  Filled 2018-05-29: qty 10

## 2018-05-29 MED ORDER — DIPHENHYDRAMINE HCL 25 MG PO CAPS
50.0000 mg | ORAL_CAPSULE | Freq: Once | ORAL | Status: AC
  Administered 2018-05-29: 50 mg via ORAL

## 2018-05-29 MED ORDER — SODIUM CHLORIDE 0.9 % IV SOLN
Freq: Once | INTRAVENOUS | Status: AC
  Administered 2018-05-29: 10:00:00 via INTRAVENOUS
  Filled 2018-05-29: qty 250

## 2018-05-29 MED ORDER — ACETAMINOPHEN 325 MG PO TABS
650.0000 mg | ORAL_TABLET | Freq: Once | ORAL | Status: AC
  Administered 2018-05-29: 650 mg via ORAL

## 2018-05-29 MED ORDER — ACETAMINOPHEN 325 MG PO TABS
ORAL_TABLET | ORAL | Status: AC
  Filled 2018-05-29: qty 2

## 2018-05-29 NOTE — Telephone Encounter (Signed)
LVM for pt regarding appts per 10/25

## 2018-05-29 NOTE — Assessment & Plan Note (Signed)
Metastatic breast cancer with innumerable liver metastases detected on ultrasound and recent MRI of the liver on 04/26/2016 (Prior history of left breast IDC T1 1 N0 stage IA 0.3 cm grade 1 tumor that was ER 38%, PR 93%, Ki-67 10%, HER-2 2+, no tissue for FISH testing, 0/1 lymph node negative, status post lumpectomy radiation and 5 years of Arimidex completed in February 2013)  PET/CT scan: 05/07/2016: Extensive metastatic involvement of both lobes of the liver, primary could be liver, phalangeal or metastatic disease, hypermetabolic thoracic lymph nodes left axillary, left supraclavicular and right CP angle lymph nodes, hypermetabolic upper abdominal lymph nodes  Liver biopsy10/07/2016: Metastatic carcinoma breast primary strong positivity for CK 7 weak focal positivity for ER, GCDFP, negative for PR, CDX 2, TTF-1, WT 1; HER-2 positive  Treatment plan: 1. Taxotere Herceptin and Perjeta every 3 weeks palliative chemotherapy started 06/13/16 (Perjeta discontinued after 5 cycles due to allergy) 2. followed by Herceptin maintenance stopped 05/08/2018 due to progression ----------------------------------------------------------------------------------------------------------------------------------------- Current treatment: Kadcyla starting 05/29/2018 Patient completed palliative radiation therapy to the brain  Return to clinic in 3 weeks for toxicity check.

## 2018-05-29 NOTE — Progress Notes (Signed)
Patient Care Team: [name], MD as PCP - General (Internal Medicine)  DIAGNOSIS:  Encounter Diagnosis  Name Primary?  . Metastatic breast cancer ([place])     SUMMARY OF ONCOLOGIC HISTORY:   Metastatic breast cancer ([place])   06/05/2006 Initial Diagnosis    Left breast biopsy: DCIS with apocrine features ER 30%, PR 13%    06/19/2006 Surgery    Left lumpectomy: stage I, pT1a, pN0(i)(sn), pMX, 0.3 cm IDC, grade 1, with extensive high-grade DCIS , margins negative ER 38 %, PR 93 %, Ki-67 10%, HER-2/neu 2+, no additional tissue available for FISH testing, with 0/1 left axillary lymph nodes.    07/30/2006 - 09/17/2006 Radiation Therapy    Adjuvant radiation therapy    09/24/2006 - 09/25/2011 Anti-estrogen oral therapy    Antiestrogen therapy with Arimidex 5 years    04/26/2016 Relapse/Recurrence    Innumerable hepatic lesions seen in both lobes most are 1-2 cm size somewhat confluent largest in the posterior right hepatic dome 3 x 4.6 cm    05/07/2016 PET scan    PET/CT scan: Extensive metastatic involvement of both lobes of the liver, primary could be liver, phalangeal or metastatic disease, hypermetabolic thoracic LN left axillary, left supraclavicular and right CP angle LN, hypermetabolic upper abdominal LN    05/16/2016 Initial Biopsy    Liver biopsy: Metastatic carcinoma breast primary strong positivity for CK 7 weak focal positivity for ER, GCDFP, negative for PR, CDX 2, TTF-1, WT 1; HER-2 positive    06/13/2016 - 11/07/2016 Chemotherapy    Taxotere Herceptin and Perjeta every 3 weeks (Taxotere was held for elevated LFTs for cycles 1 and 2)     11/28/2016 - 05/06/2018 Chemotherapy    Herceptin maintenance therapy for metastatic breast cancer every 3 weeks     03/24/2018 Surgery    Chronic cholecystitis: Diagnostic laparoscopy: Due to intermittent attacks of biliary colic: The gallbladder was rockhard and not distensible with tumor palpated in the porta hepatis, extensive lucent liver  lesions, decision was made to stop the surgery.    05/12/2018 - 05/22/2018 Radiation Therapy    Radiation to the brain    05/29/2018 -  Chemotherapy    Kadcyla      CHIEF COMPLIANT: Kadcyla cycle 1  INTERVAL HISTORY: Monique Figueroa is a 84 year old with above-mentioned history of metastatic breast cancer who was on long-term control with Herceptin and then started developing progressive metastatic disease in the liver as well as to the brain.  She underwent palliative radiation therapy to the brain and tolerated it fairly well.  She is slightly unsteady on the gait.  She is here today to begin her first treatment with Kadcyla.  REVIEW OF SYSTEMS:   Constitutional: Denies fevers, chills or abnormal weight loss Eyes: Denies blurriness of vision Ears, nose, mouth, throat, and face: Denies mucositis or sore throat Respiratory: Denies cough, dyspnea or wheezes Cardiovascular: Denies palpitation, chest discomfort Gastrointestinal:  Denies nausea, heartburn or change in bowel habits Skin: Denies abnormal skin rashes Lymphatics: Denies new lymphadenopathy or easy bruising Neurological: Generalized weakness, unsteadiness on the gait Behavioral/Psych: Mood is stable, no new changes  Extremities: No lower extremity edema  All other systems were reviewed with the patient and are negative.  I have reviewed the past medical history, past surgical history, social history and family history with the patient and they are unchanged from previous note.  ALLERGIES:  is allergic to perjeta [pertuzumab] and sulfa antibiotics.  MEDICATIONS:  Current Outpatient Medications  Medication Sig  Dispense Refill  . acetaminophen (TYLENOL) 325 MG tablet Take 325 mg by mouth daily as needed for moderate pain or headache.     . benzocaine (ORAJEL) 10 % mucosal gel Use as directed 1 application in the mouth or throat as needed for mouth pain.    . calcium carbonate (TUMS - DOSED IN MG ELEMENTAL CALCIUM) 500 MG  chewable tablet Chew 1 tablet by mouth daily as needed for indigestion or heartburn.    . Cholecalciferol (VITAMIN D) 2000 units tablet Take 2,000 Units by mouth daily.    Marland Kitchen dexamethasone (DECADRON) 4 MG tablet Take 2 tablets (8 mg total) by mouth 2 (two) times daily. (Patient not taking: Reported on 05/08/2018) 30 tablet 0  . dexamethasone (DECADRON) 4 MG tablet Take 1 tablet (4 mg total) by mouth 3 (three) times daily with meals. Follow taper instructions provided by Dr. Lisbeth Renshaw. 50 tablet 0  . diphenhydrAMINE (BENADRYL) 25 MG tablet Take 25 mg by mouth daily as needed for allergies.    Marland Kitchen levothyroxine (SYNTHROID, LEVOTHROID) 125 MCG tablet Take 125 mcg by mouth daily before breakfast.     . metFORMIN (GLUCOPHAGE) 500 MG tablet Take 500 mg by mouth daily with breakfast.     . omeprazole (PRILOSEC OTC) 20 MG tablet Take 20 mg by mouth daily as needed (acid reflux).     . ondansetron (ZOFRAN) 4 MG tablet Take 1 tablet (4 mg total) by mouth daily as needed for nausea or vomiting. 30 tablet 3  . ondansetron (ZOFRAN-ODT) 8 MG disintegrating tablet Take 1 tablet (8 mg total) by mouth every 8 (eight) hours as needed for nausea or vomiting. 30 tablet 3  . oxyCODONE-acetaminophen (PERCOCET) 5-325 MG tablet Take 1 tablet by mouth every 4 (four) hours as needed for moderate pain. (Patient not taking: Reported on 05/08/2018) 30 tablet 0  . prochlorperazine (COMPAZINE) 10 MG tablet TAKE 1 TABLET (10 MG TOTAL) BY MOUTH EVERY 6 (SIX) HOURS AS NEEDED (NAUSEA OR VOMITING). (Patient not taking: Reported on 05/08/2018) 30 tablet 1  . prochlorperazine (COMPAZINE) 25 MG suppository Place 1 suppository (25 mg total) rectally every 12 (twelve) hours as needed for nausea or vomiting. (Patient not taking: Reported on 05/08/2018) 12 suppository 0  . Propylene Glycol (SYSTANE COMPLETE) 0.6 % SOLN Place 1 drop into both eyes 2 (two) times daily.    . sodium chloride (OCEAN) 0.65 % SOLN nasal spray Place 1 spray into both nostrils as  needed for congestion.     No current facility-administered medications for this visit.     PHYSICAL EXAMINATION: ECOG PERFORMANCE STATUS: 2 - Symptomatic, <50% confined to bed  Vitals:   05/29/18 0905  BP: (!) 124/92  Pulse: 75  Resp: 16  Temp: 97.6 F (36.4 C)  SpO2: 100%   Filed Weights   05/29/18 0905  Weight: 149 lb 14.4 oz (68 kg)    GENERAL:alert, no distress and comfortable SKIN: skin color, texture, turgor are normal, no rashes or significant lesions EYES: normal, Conjunctiva are pink and non-injected, sclera clear OROPHARYNX:no exudate, no erythema and lips, buccal mucosa, and tongue normal  NECK: supple, thyroid normal size, non-tender, without nodularity LYMPH:  no palpable lymphadenopathy in the cervical, axillary or inguinal LUNGS: clear to auscultation and percussion with normal breathing effort HEART: regular rate & rhythm and no murmurs and no lower extremity edema ABDOMEN:abdomen soft, non-tender and normal bowel sounds MUSCULOSKELETAL:no cyanosis of digits and no clubbing  NEURO: alert & oriented x 3 with fluent speech, no  focal motor/sensory deficits EXTREMITIES: No lower extremity edema   LABORATORY DATA:  I have reviewed the data as listed CMP Latest Ref Rng & Units 05/29/2018 05/06/2018 04/27/2018  Glucose 70 - 99 mg/dL 124(H) 116(H) 134(H)  BUN 8 - 23 mg/dL _0 Creatinine 0.44 - 1.00 mg/dL 0.71 0.85 0.79  Sodium 135 - 145 mmol/L 138 139 140  Potassium 3.5 - 5.1 mmol/L 3.9 3.9 4.2  Chloride 98 - 111 mmol/L 102 103 104  CO2 22 - 32 mmol/L _1 Calcium 8.9 - 10.3 mg/dL 8.4(L) 9.1 9.0  Total Protein 6.5 - 8.1 g/dL 6.0(L) 7.0 6.8  Total Bilirubin 0.3 - 1.2 mg/dL 1.3(H) 2.4(H) 3.3(H)  Alkaline Phos 38 - 126 U/L 111 256(H) 394(H)  AST 15 - 41 U/L 22 32 45(H)  ALT 0 - 44 U/L 24 19 63(H)    Lab Results  Component Value Date   WBC 8.4 05/29/2018   HGB 12.5 05/29/2018   HCT 37.2 05/29/2018   MCV 99.5 05/29/2018   PLT 139 (L) 05/29/2018     NEUTROABS 7.6 05/29/2018    ASSESSMENT & PLAN:  Metastatic breast cancer ([place]) Metastatic breast cancer with innumerable liver metastases detected on ultrasound and recent MRI of the liver on 04/26/2016 (Prior history of left breast IDC T1 1 N0 stage IA 0.3 cm grade 1 tumor that was ER 38%, PR 93%, Ki-67 10%, HER-2 2+, no tissue for FISH testing, 0/1 lymph node negative, status post lumpectomy radiation and 5 years of Arimidex completed in February 2013)  PET/CT scan: 05/07/2016: Extensive metastatic involvement of both lobes of the liver, primary could be liver, phalangeal or metastatic disease, hypermetabolic thoracic lymph nodes left axillary, left supraclavicular and right CP angle lymph nodes, hypermetabolic upper abdominal lymph nodes  Liver biopsy10/07/2016: Metastatic carcinoma breast primary strong positivity for CK 7 weak focal positivity for ER, GCDFP, negative for PR, CDX 2, TTF-1, WT 1; HER-2 positive  Treatment plan: 1. Taxotere Herceptin and Perjeta every 3 weeks palliative chemotherapy started 06/13/16 (Perjeta discontinued after 5 cycles due to allergy) 2. followed by Herceptin maintenance stopped 05/08/2018 due to progression ----------------------------------------------------------------------------------------------------------------------------------------- Current treatment: Kadcyla starting 05/29/2018 Patient completed palliative radiation therapy to the brain I counseled extensively about Kadcyla related side effects and she is ready to start treatment.  Prior severe jaundice: Bilirubin has come down to 1.5 Return to clinic in 3 weeks for cycle 2 and toxicity check.    No orders of the defined types were placed in this encounter.  The patient has a good understanding of the overall plan. she agrees with it. she will call with any problems that may develop before the next visit here.   Harriette Ohara, MD 05/29/18

## 2018-05-29 NOTE — Patient Instructions (Addendum)
Ado-Trastuzumab Emtansine for injection What is this medicine? ADO-TRASTUZUMAB EMTANSINE (ADD oh traz TOO zuh mab em TAN zine) is a monoclonal antibody combined with chemotherapy. It is used to treat breast cancer. This medicine may be used for other purposes; ask your health care provider or pharmacist if you have questions. COMMON BRAND NAME(S): Kadcyla What should I tell my health care provider before I take this medicine? They need to know if you have any of these conditions: -heart disease -heart failure -infection (especially a virus infection such as chickenpox, cold sores, or herpes) -liver disease -lung or breathing disease, like asthma -an unusual or allergic reaction to ado-trastuzumab emtansine, other medications, foods, dyes, or preservatives -pregnant or trying to get pregnant -breast-feeding How should I use this medicine? This medicine is for infusion into a vein. It is given by a health care professional in a hospital or clinic setting. Talk to your pediatrician regarding the use of this medicine in children. Special care may be needed. Overdosage: If you think you have taken too much of this medicine contact a poison control center or emergency room at once. NOTE: This medicine is only for you. Do not share this medicine with others. What if I miss a dose? It is important not to miss your dose. Call your doctor or health care professional if you are unable to keep an appointment. What may interact with this medicine? This medicine may also interact with the following medications: -atazanavir -boceprevir -clarithromycin -delavirdine -indinavir -dalfopristin; quinupristin -isoniazid, INH -itraconazole -ketoconazole -nefazodone -nelfinavir -ritonavir -telaprevir -telithromycin -tipranavir -voriconazole This list may not describe all possible interactions. Give your health care provider a list of all the medicines, herbs, non-prescription drugs, or dietary  supplements you use. Also tell them if you smoke, drink alcohol, or use illegal drugs. Some items may interact with your medicine. What should I watch for while using this medicine? Visit your doctor for checks on your progress. This drug may make you feel generally unwell. This is not uncommon, as chemotherapy can affect healthy cells as well as cancer cells. Report any side effects. Continue your course of treatment even though you feel ill unless your doctor tells you to stop. You may need blood work done while you are taking this medicine. Call your doctor or health care professional for advice if you get a fever, chills or sore throat, or other symptoms of a cold or flu. Do not treat yourself. This drug decreases your body's ability to fight infections. Try to avoid being around people who are sick. Be careful brushing and flossing your teeth or using a toothpick because you may get an infection or bleed more easily. If you have any dental work done, tell your dentist you are receiving this medicine. Avoid taking products that contain aspirin, acetaminophen, ibuprofen, naproxen, or ketoprofen unless instructed by your doctor. These medicines may hide a fever. Do not become pregnant while taking this medicine or for 7 months after stopping it, men with female partners should use contraception during treatment and for 4 months after the last dose. Women should inform their doctor if they wish to become pregnant or think they might be pregnant. There is a potential for serious side effects to an unborn child. Do not breast-feed an infant while taking this medicine or for 7 months after the last dose. Men who have a partner who is pregnant or who is capable of becoming pregnant should use a condom during sexual activity while taking this medicine and for   4 months after stopping it. Men should inform their doctors if they wish to father a child. This medicine may lower sperm counts. Talk to your health care  professional or pharmacist for more information. What side effects may I notice from receiving this medicine? Side effects that you should report to your doctor or health care professional as soon as possible: -allergic reactions like skin rash, itching or hives, swelling of the face, lips, or tongue -breathing problems -chest pain or palpitations -fever or chills, sore throat -general ill feeling or flu-like symptoms -light-colored stools -nausea, vomiting -pain, tingling, numbness in the hands or feet -signs and symptoms of bleeding such as bloody or black, tarry stools; red or dark-brown urine; spitting up blood or brown material that looks like coffee grounds; red spots on the skin; unusual bruising or bleeding from the eye, gums, or nose -swelling of the legs or ankles -yellowing of the eyes or skin Side effects that usually do not require medical attention (report to your doctor or health care professional if they continue or are bothersome): -changes in taste -constipation -dizziness -headache -joint pain -muscle pain -trouble sleeping -unusually weak or tired This list may not describe all possible side effects. Call your doctor for medical advice about side effects. You may report side effects to FDA at 1-800-FDA-1088. Where should I keep my medicine? This drug is given in a hospital or clinic and will not be stored at home. NOTE: This sheet is a summary. It may not cover all possible information. If you have questions about this medicine, talk to your doctor, pharmacist, or health care provider.  2018 Elsevier/Gold Standard (2015-09-11 12:11:06)  

## 2018-06-01 ENCOUNTER — Telehealth: Payer: Self-pay | Admitting: Radiation Oncology

## 2018-06-01 NOTE — Telephone Encounter (Signed)
Pt called radiation and left a message stating that they had not heard about scheduling an [facility]. I had an after hours nurse from med onc line take the call information since it looks like they were trying to reach her on Friday.

## 2018-06-02 ENCOUNTER — Telehealth: Payer: Self-pay

## 2018-06-02 ENCOUNTER — Other Ambulatory Visit: Payer: Self-pay

## 2018-06-02 ENCOUNTER — Ambulatory Visit (HOSPITAL_COMMUNITY)
Admission: RE | Admit: 2018-06-02 | Discharge: 2018-06-02 | Disposition: A | Discharge status: Home / Self Care | Payer: Medicare Other | Source: Ambulatory Visit | Attending: Hematology and Oncology | Admitting: Hematology and Oncology

## 2018-06-02 DIAGNOSIS — M7989 Other specified soft tissue disorders: Secondary | ICD-10-CM | POA: Insufficient documentation

## 2018-06-02 DIAGNOSIS — C7931 Secondary malignant neoplasm of brain: Secondary | ICD-10-CM | POA: Insufficient documentation

## 2018-06-02 DIAGNOSIS — C787 Secondary malignant neoplasm of liver and intrahepatic bile duct: Secondary | ICD-10-CM

## 2018-06-02 DIAGNOSIS — C50919 Malignant neoplasm of unspecified site of unspecified female breast: Secondary | ICD-10-CM

## 2018-06-02 NOTE — Progress Notes (Signed)
Left lower extremity venous duplex completed. Preliminary results - There is no evidence of a DVT or Baker's cyst. [place] Jalexia Lalli,RVS  06/02/2018, 1:58 PM

## 2018-06-02 NOTE — Telephone Encounter (Signed)
Patient called with c/o swelling in left ankle and foot X 4 days.  Patient denies pain, redness, and denies shortness of breath.    Patient scheduled for venous doppler per Dr. Geralyn Flash recommendations today at 1pm.    Patient notified and voiced agreement and understanding.  Nurse encouraged patient if SOB or dizziness develops prior to appointment utilized ED.

## 2018-06-04 ENCOUNTER — Encounter: Payer: Self-pay | Admitting: Radiation Oncology

## 2018-06-04 NOTE — Progress Notes (Signed)
  Radiation Oncology         [phone]) [phone] ________________________________  Name: Monique Figueroa MRN: [id number]  Date: 06/04/2018  DOB: [birth year]  End of Treatment Note  Diagnosis:   84 yo woman with 5 cerebellar brain metastases from breast cancer     Indication for treatment:  Palliative       Radiation treatment dates:   05/11/2018 - 05/22/2018  Site/dose:   The posterior fossa was treated to 30 Gy in 10 fractions  Beams/energy:   3D, Photons/ 6X  Narrative: The patient tolerated radiation treatment relatively well. She reported fatigue, blurry vision in left eye, and poor short-term memory, and denied headache, pain throughout treatment. Her gait remained unsteady, and she ambulated with a cane. At the end of the first week, she reported nausea and vomiting that resolved by the end of treatment, she demonstrated normal hand-eye coordination, and no alopecia was noted. By the end of treatment, she reported new onset left leg edema without pain or fever, and denied tinnitus, dizziness, and thrush.  Plan: The patient has completed radiation treatment. The patient will return to radiation oncology clinic for routine followup in one month. I advised her to call or return sooner if she has any questions or concerns related to her recovery or treatment. ________________________________  Sheral Apley. Tammi Klippel, M.D.   This document serves as a record of services personally performed by Tyler Pita, MD. It was created on his behalf by Wilburn Mylar, a trained medical scribe. The creation of this record is based on the scribe's personal observations and the provider's statements to them. This document has been checked and approved by the attending provider.

## 2018-06-18 ENCOUNTER — Inpatient Hospital Stay: Payer: Medicare Other

## 2018-06-18 ENCOUNTER — Other Ambulatory Visit: Payer: Medicare Other

## 2018-06-18 ENCOUNTER — Other Ambulatory Visit: Payer: Self-pay | Admitting: Adult Health

## 2018-06-18 ENCOUNTER — Inpatient Hospital Stay: Payer: Medicare Other | Admitting: Adult Health

## 2018-06-18 ENCOUNTER — Encounter: Payer: Self-pay | Admitting: Adult Health

## 2018-06-18 ENCOUNTER — Inpatient Hospital Stay: Payer: Medicare Other | Attending: Hematology and Oncology

## 2018-06-18 ENCOUNTER — Ambulatory Visit: Payer: Medicare Other | Admitting: Hematology and Oncology

## 2018-06-18 ENCOUNTER — Ambulatory Visit: Payer: Medicare Other

## 2018-06-18 VITALS — BP 125/63 | HR 83 | Temp 98.0°F | Resp 16

## 2018-06-18 VITALS — BP 135/61 | HR 80 | Temp 97.7°F | Ht 65.0 in | Wt 144.2 lb

## 2018-06-18 DIAGNOSIS — E119 Type 2 diabetes mellitus without complications: Secondary | ICD-10-CM | POA: Insufficient documentation

## 2018-06-18 DIAGNOSIS — K828 Other specified diseases of gallbladder: Secondary | ICD-10-CM | POA: Diagnosis not present

## 2018-06-18 DIAGNOSIS — C787 Secondary malignant neoplasm of liver and intrahepatic bile duct: Secondary | ICD-10-CM

## 2018-06-18 DIAGNOSIS — Z170 Estrogen receptor positive status [ER+]: Secondary | ICD-10-CM

## 2018-06-18 DIAGNOSIS — Z5112 Encounter for antineoplastic immunotherapy: Secondary | ICD-10-CM | POA: Insufficient documentation

## 2018-06-18 DIAGNOSIS — M7989 Other specified soft tissue disorders: Secondary | ICD-10-CM

## 2018-06-18 DIAGNOSIS — K219 Gastro-esophageal reflux disease without esophagitis: Secondary | ICD-10-CM

## 2018-06-18 DIAGNOSIS — E039 Hypothyroidism, unspecified: Secondary | ICD-10-CM

## 2018-06-18 DIAGNOSIS — Z9221 Personal history of antineoplastic chemotherapy: Secondary | ICD-10-CM

## 2018-06-18 DIAGNOSIS — C50912 Malignant neoplasm of unspecified site of left female breast: Secondary | ICD-10-CM | POA: Insufficient documentation

## 2018-06-18 DIAGNOSIS — Z87440 Personal history of urinary (tract) infections: Secondary | ICD-10-CM | POA: Insufficient documentation

## 2018-06-18 DIAGNOSIS — I10 Essential (primary) hypertension: Secondary | ICD-10-CM | POA: Insufficient documentation

## 2018-06-18 DIAGNOSIS — Z923 Personal history of irradiation: Secondary | ICD-10-CM | POA: Diagnosis not present

## 2018-06-18 DIAGNOSIS — C50919 Malignant neoplasm of unspecified site of unspecified female breast: Secondary | ICD-10-CM

## 2018-06-18 DIAGNOSIS — C7931 Secondary malignant neoplasm of brain: Secondary | ICD-10-CM | POA: Diagnosis not present

## 2018-06-18 DIAGNOSIS — E876 Hypokalemia: Secondary | ICD-10-CM

## 2018-06-18 LAB — CMP (CANCER CENTER ONLY)
ALBUMIN: 2.7 g/dL — AB (ref 3.5–5.0)
ALK PHOS: 80 U/L (ref 38–126)
ALT: 10 U/L (ref 0–44)
ANION GAP: 10 (ref 5–15)
AST: 19 U/L (ref 15–41)
BUN: 11 mg/dL (ref 8–23)
CALCIUM: 7.7 mg/dL — AB (ref 8.9–10.3)
CO2: 25 mmol/L (ref 22–32)
CREATININE: 0.71 mg/dL (ref 0.44–1.00)
Chloride: 105 mmol/L (ref 98–111)
GFR, Est AFR Am: >60 mL/min (ref >60)
GFR, Estimated: >60 mL/min (ref >60)
GLUCOSE: 166 mg/dL — AB (ref 70–99)
Potassium: 3 mmol/L — CL (ref 3.5–5.1)
SODIUM: 140 mmol/L (ref 135–145)
Total Bilirubin: 0.6 mg/dL (ref 0.3–1.2)
Total Protein: 6.1 g/dL — ABNORMAL LOW (ref 6.5–8.1)

## 2018-06-18 LAB — CBC WITH DIFFERENTIAL (CANCER CENTER ONLY)
ABS IMMATURE GRANULOCYTES: 0.06 10*3/uL (ref 0.00–0.07)
BASOS PCT: 0 %
Basophils Absolute: 0 10*3/uL (ref 0.0–0.1)
EOS ABS: 0 10*3/uL (ref 0.0–0.5)
Eosinophils Relative: 0 %
HCT: 36.6 % (ref 36.0–46.0)
Hemoglobin: 12.5 g/dL (ref 12.0–15.0)
IMMATURE GRANULOCYTES: 1 %
Lymphocytes Relative: 8 %
Lymphs Abs: 0.6 10*3/uL — ABNORMAL LOW (ref 0.7–4.0)
MCH: 33 pg (ref 26.0–34.0)
MCHC: 34.2 g/dL (ref 30.0–36.0)
MCV: 96.6 fL (ref 80.0–100.0)
Monocytes Absolute: 0.5 10*3/uL (ref 0.1–1.0)
Monocytes Relative: 7 %
NEUTROS ABS: 6.8 10*3/uL (ref 1.7–7.7)
NEUTROS PCT: 84 %
PLATELETS: 169 10*3/uL (ref 150–400)
RBC: 3.79 MIL/uL — ABNORMAL LOW (ref 3.87–5.11)
RDW: 13.2 % (ref 11.5–15.5)
WBC: 8.1 10*3/uL (ref 4.0–10.5)
nRBC: 0 % (ref 0.0–0.2)

## 2018-06-18 MED ORDER — ACETAMINOPHEN 325 MG PO TABS
650.0000 mg | ORAL_TABLET | Freq: Once | ORAL | Status: AC
  Administered 2018-06-18: 650 mg via ORAL

## 2018-06-18 MED ORDER — HEPARIN SOD (PORK) LOCK FLUSH 100 UNIT/ML IV SOLN
500.0000 [IU] | Freq: Once | INTRAVENOUS | Status: AC | PRN
  Administered 2018-06-18: 500 [IU]
  Filled 2018-06-18: qty 5

## 2018-06-18 MED ORDER — DIPHENHYDRAMINE HCL 25 MG PO CAPS
50.0000 mg | ORAL_CAPSULE | Freq: Once | ORAL | Status: AC
  Administered 2018-06-18: 50 mg via ORAL

## 2018-06-18 MED ORDER — ACETAMINOPHEN 325 MG PO TABS
ORAL_TABLET | ORAL | Status: AC
  Filled 2018-06-18: qty 2

## 2018-06-18 MED ORDER — SODIUM CHLORIDE 0.9 % IV SOLN
3.0000 mg/kg | Freq: Once | INTRAVENOUS | Status: AC
  Administered 2018-06-18: 200 mg via INTRAVENOUS
  Filled 2018-06-18: qty 10

## 2018-06-18 MED ORDER — SODIUM CHLORIDE 0.9 % IV SOLN
Freq: Once | INTRAVENOUS | Status: AC
  Administered 2018-06-18: 11:00:00 via INTRAVENOUS
  Filled 2018-06-18: qty 250

## 2018-06-18 MED ORDER — DIPHENHYDRAMINE HCL 25 MG PO CAPS
ORAL_CAPSULE | ORAL | Status: AC
  Filled 2018-06-18: qty 2

## 2018-06-18 MED ORDER — SODIUM CHLORIDE 0.9% FLUSH
10.0000 mL | INTRAVENOUS | Status: DC | PRN
  Administered 2018-06-18: 10 mL
  Filled 2018-06-18: qty 10

## 2018-06-18 MED ORDER — POTASSIUM CHLORIDE ER 10 MEQ PO TBCR: 20.0000 meq | EXTENDED_RELEASE_TABLET | Freq: Every day | ORAL | 0 refills | Status: DC

## 2018-06-18 NOTE — Patient Instructions (Signed)
Cancer Center Discharge Instructions for Patients Receiving Chemotherapy  Today you received the following chemotherapy agents :  Kadcyla.  To help prevent nausea and vomiting after your treatment, we encourage you to take your nausea medication as prescribed.   If you develop nausea and vomiting that is not controlled by your nausea medication, call the clinic.   BELOW ARE SYMPTOMS THAT SHOULD BE REPORTED IMMEDIATELY:  *FEVER GREATER THAN 100.5 F  *CHILLS WITH OR WITHOUT FEVER  NAUSEA AND VOMITING THAT IS NOT CONTROLLED WITH YOUR NAUSEA MEDICATION  *UNUSUAL SHORTNESS OF BREATH  *UNUSUAL BRUISING OR BLEEDING  TENDERNESS IN MOUTH AND THROAT WITH OR WITHOUT PRESENCE OF ULCERS  *URINARY PROBLEMS  *BOWEL PROBLEMS  UNUSUAL RASH Items with * indicate a potential emergency and should be followed up as soon as possible.  Feel free to call the clinic should you have any questions or concerns. The clinic phone number is [phone].  Please show the CHEMO ALERT CARD at check-in to the Emergency Department and triage nurse.   

## 2018-06-18 NOTE — Progress Notes (Signed)
[facility] Follow up:    Monique Figueroa, [place] [address]   DIAGNOSIS: Cancer Staging No matching staging information was found for the patient.  SUMMARY OF ONCOLOGIC HISTORY:   Metastatic breast cancer ([place])   06/05/2006 Initial Diagnosis    Left breast biopsy: DCIS with apocrine features ER 30%, PR 13%    06/19/2006 Surgery    Left lumpectomy: stage I, pT1a, pN0(i)(sn), pMX, 0.3 cm IDC, grade 1, with extensive high-grade DCIS , margins negative ER 38 %, PR 93 %, Ki-67 10%, HER-2/neu 2+, no additional tissue available for FISH testing, with 0/1 left axillary lymph nodes.    07/30/2006 - 09/17/2006 Radiation Therapy    Adjuvant radiation therapy    09/24/2006 - 09/25/2011 Anti-estrogen oral therapy    Antiestrogen therapy with Arimidex 5 years    04/26/2016 Relapse/Recurrence    Innumerable hepatic lesions seen in both lobes most are 1-2 cm size somewhat confluent largest in the posterior right hepatic dome 3 x 4.6 cm    05/07/2016 PET scan    PET/CT scan: Extensive metastatic involvement of both lobes of the liver, primary could be liver, phalangeal or metastatic disease, hypermetabolic thoracic LN left axillary, left supraclavicular and right CP angle LN, hypermetabolic upper abdominal LN    05/16/2016 Initial Biopsy    Liver biopsy: Metastatic carcinoma breast primary strong positivity for CK 7 weak focal positivity for ER, GCDFP, negative for PR, CDX 2, TTF-1, WT 1; HER-2 positive    06/13/2016 - 11/07/2016 Chemotherapy    Taxotere Herceptin and Perjeta every 3 weeks (Taxotere was held for elevated LFTs for cycles 1 and 2)     11/28/2016 - 05/06/2018 Chemotherapy    Herceptin maintenance therapy for metastatic breast cancer every 3 weeks     03/24/2018 Surgery    Chronic cholecystitis: Diagnostic laparoscopy: Due to intermittent attacks of biliary colic: The gallbladder was rockhard and not distensible with tumor palpated in  the porta hepatis, extensive lucent liver lesions, decision was made to stop the surgery.    05/12/2018 - 05/22/2018 Radiation Therapy    Radiation to the brain    05/29/2018 -  Chemotherapy    Kadcyla      CURRENT THERAPY: Kadcyla  INTERVAL HISTORY: Monique Figueroa 84 y.o. female returns for evaluation prior to receiving kadcyla.  She is tolerating this moderately well she has some fatigue after taking it but nothing other than that.  She notes she experienced headaches prior to finding cancer in the brain, and since her radiation, those have resolved.  Seh tells me that she has tapered off of the Dexamethasone without any difficulty.  Her weight is decreased by about 5 pounds, and she says she has a decreased appetite sometimes.  Monique Figueroa did have some left leg swelling.  This has improved and a doppler done at the end of October was negative.    After the visit was completed I met her son Monique Figueroa who was outside of the room.  He notes that Monique Figueroa's appetite was decreased and that they offer her food and she does not eat enough.  She has ensure at her house that she can drink.   Patient Active Problem List   Diagnosis Date Noted  . Brain metastases ([facility]) 05/08/2018  . Bile duct obstruction 04/20/2018  . Biliary dyskinesia 03/24/2018  . Tuberculosis   . Port catheter in place 04/03/2017  . Infusion reaction 06/13/2016  . Metastases to the liver (  [facility]) 05/08/2016  . Metastatic breast cancer ([facility]) 06/23/2013    is allergic to perjeta [pertuzumab] and sulfa antibiotics.  MEDICAL HISTORY: Past Medical History:  Diagnosis Date  . Anemia   . Appendicitis   . Arthritis   . Biliary dyskinesia 03/24/2018  . Breast cancer ([facility]) 06/2006   Invasive ductal and DCIS of left breast  . Breast tumor    History of bilateral breast tumors/cysts  . Cholecystitis   . Cyst of spinal meninges   . Diabetes mellitus without complication ([facility])   . GERD (gastroesophageal reflux disease)   .  Hypertension   . Hypothyroidism   . Metastatic breast cancer ([facility]) 04/26/2016   mets to liver  . PONV (postoperative nausea and vomiting)   . Thyroid cancer ([facility])   . Tuberculosis    medullary carcinoma ; denies TB   . UTI (urinary tract infection)    hx    SURGICAL HISTORY: Past Surgical History:  Procedure Laterality Date  . APPENDECTOMY    . BALLOON DILATION N/A 04/20/2018   Procedure: BALLOON DILATION;  Surgeon: Mansouraty, Gabriel Jr., MD;  Location: [facility];  Service: Gastroenterology;  Laterality: N/A;  . BILIARY STENT PLACEMENT  04/20/2018   Procedure: BILIARY STENT PLACEMENT;  Surgeon: Mansouraty, Gabriel Jr., MD;  Location: [facility];  Service: Gastroenterology;;  . BREAST CYST EXCISION Bilateral    Several asprirations and excisions  . BREAST LUMPECTOMY WITH AXILLARY LYMPH NODE BIOPSY Left 06/19/2006   Invasive ductal and in situ carcinoma, node negative  . CATARACT EXTRACTION, BILATERAL  2009  . CHOLECYSTECTOMY N/A 03/24/2018   Procedure: DIAGNOSTIC LAPAROSCOPY;  Surgeon: Ingram, Haywood, MD;  Location: [facility];  Service: General;  Laterality: N/A;  . ENDOSCOPIC RETROGRADE CHOLANGIOPANCREATOGRAPHY (ERCP) WITH PROPOFOL N/A 04/20/2018   Procedure: ENDOSCOPIC RETROGRADE CHOLANGIOPANCREATOGRAPHY (ERCP) WITH PROPOFOL ;  Surgeon: Mansouraty, Gabriel Jr., MD;  Location: [facility];  Service: Gastroenterology;  Laterality: N/A;  . PORTACATH PLACEMENT N/A 06/05/2016   Procedure: INSERTION PORT-A-CATH WITH US;  Surgeon: Haywood Ingram, MD;  Location: [facility];  Service: General;  Laterality: N/A;  . SPHINCTEROTOMY  04/20/2018   Procedure: SPHINCTEROTOMY;  Surgeon: Mansouraty, Gabriel Jr., MD;  Location: [facility];  Service: Gastroenterology;;  . SPINE SURGERY    . TOTAL THYROIDECTOMY      SOCIAL HISTORY: Social History   Socioeconomic History  . Marital status: Widowed    Spouse name: Not on file  . Number of children: 1  . Years of education: Not  on file  . Highest education level: Not on file  Occupational History  . Not on file  Social Needs  . Financial resource strain: Not on file  . Food insecurity:    Worry: Not on file    Inability: Not on file  . Transportation needs:    Medical: Not on file    Non-medical: Not on file  Tobacco Use  . Smoking status: Never Smoker  . Smokeless tobacco: Never Used  Substance and Sexual Activity  . Alcohol use: No  . Drug use: No  . Sexual activity: Not Currently    Birth control/protection: Post-menopausal  Lifestyle  . Physical activity:    Days per week: Not on file    Minutes per session: Not on file  . Stress: Not on file  Relationships  . Social connections:    Talks on phone: Not on file    Gets together: Not on file    Attends religious service: Not on file      Active member of club or organization: Not on file    Attends meetings of clubs or organizations: Not on file    Relationship status: Not on file  . Intimate partner violence:    Fear of current or ex partner: Not on file    Emotionally abused: Not on file    Physically abused: Not on file    Forced sexual activity: Not on file  Other Topics Concern  . Not on file  Social History Narrative  . Not on file    FAMILY HISTORY: Family History  Problem Relation Age of Onset  . Rheum arthritis Mother   . Coronary artery disease Mother   . Dementia Mother   . Diabetes Mother   . Heart Problems Father   . Cancer Sister        Breast cancer  . Cancer Brother        Lung cancer  . Diabetes Sister   . Dementia Sister     Review of Systems  Constitutional: Positive for appetite change, fatigue and unexpected weight change. Negative for fever.  HENT:   Negative for hearing loss, lump/mass, sore throat and trouble swallowing.   Eyes: Negative for eye problems and icterus.  Respiratory: Negative for chest tightness, cough and shortness of breath.   Cardiovascular: Positive for leg swelling. Negative for  chest pain and palpitations.  Gastrointestinal: Negative for abdominal distention, abdominal pain, constipation, nausea and vomiting.  Endocrine: Negative for hot flashes.  Skin: Negative for itching and rash.  Neurological: Negative for dizziness, extremity weakness, headaches and numbness.  Hematological: Negative for adenopathy. Does not bruise/bleed easily.  Psychiatric/Behavioral: Negative for depression. The patient is not nervous/anxious.       PHYSICAL EXAMINATION  ECOG PERFORMANCE STATUS: 1 - Symptomatic but completely ambulatory  Vitals:   06/18/18 0854  BP: 135/61  Pulse: 80  Temp: 97.7 F (36.5 C)  SpO2: 99%    Physical Exam  Constitutional: She is oriented to person, place, and time. She appears well-developed.  HENT:  Head: Normocephalic and atraumatic.  Mouth/Throat: Oropharynx is clear and moist. No oropharyngeal exudate.  Eyes: Pupils are equal, round, and reactive to light. No scleral icterus.  Neck: Neck supple.  Cardiovascular: Normal rate, regular rhythm and normal heart sounds.  Pulmonary/Chest: Effort normal and breath sounds normal.  Breast: left breast without signs of local recurrence, right breast benign  Abdominal: Soft. Bowel sounds are normal. She exhibits no distension. There is no tenderness.  Musculoskeletal: Normal range of motion. She exhibits edema (1+ left leg edema).  Lymphadenopathy:    She has no cervical adenopathy.  Neurological: She is alert and oriented to person, place, and time.  Skin: Skin is warm and dry.  Sebaceous cyst noted in left anterior axilla, small    LABORATORY DATA:  CBC    Component Value Date/Time   WBC 8.1 06/18/2018 0815   WBC 4.3 03/16/2018 1001   RBC 3.79 (L) 06/18/2018 0815   HGB 12.5 06/18/2018 0815   HGB 12.5 08/07/2017 0905   HCT 36.6 06/18/2018 0815   HCT 37.3 08/07/2017 0905   PLT 169 06/18/2018 0815   PLT 195 08/07/2017 0905   MCV 96.6 06/18/2018 0815   MCV 93.6 08/07/2017 0905   MCH 33.0  06/18/2018 0815   MCHC 34.2 06/18/2018 0815   RDW 13.2 06/18/2018 0815   RDW 13.2 08/07/2017 0905   LYMPHSABS 0.6 (L) 06/18/2018 0815   LYMPHSABS 1.1 08/07/2017 0905   MONOABS 0.5 06/18/2018 0815     MONOABS 0.4 08/07/2017 0905   EOSABS 0.0 06/18/2018 0815   EOSABS 0.1 08/07/2017 0905   BASOSABS 0.0 06/18/2018 0815   BASOSABS 0.0 08/07/2017 0905    CMP     Component Value Date/Time   NA 140 06/18/2018 0815   NA 139 08/07/2017 0905   K 3.0 (LL) 06/18/2018 0815   K 3.7 08/07/2017 0905   CL 105 06/18/2018 0815   CL 104 06/09/2012 1402   CO2 25 06/18/2018 0815   CO2 27 08/07/2017 0905   GLUCOSE 166 (H) 06/18/2018 0815   GLUCOSE 102 08/07/2017 0905   GLUCOSE 153 (H) 06/09/2012 1402   BUN 11 06/18/2018 0815   BUN 14.7 08/07/2017 0905   CREATININE 0.71 06/18/2018 0815   CREATININE 0.8 08/07/2017 0905   CALCIUM 7.7 (L) 06/18/2018 0815   CALCIUM 9.3 08/07/2017 0905   PROT 6.1 (L) 06/18/2018 0815   PROT 7.3 08/07/2017 0905   ALBUMIN 2.7 (L) 06/18/2018 0815   ALBUMIN 3.9 08/07/2017 0905   AST 19 06/18/2018 0815   AST 22 08/07/2017 0905   ALT 10 06/18/2018 0815   ALT 14 08/07/2017 0905   ALKPHOS 80 06/18/2018 0815   ALKPHOS 68 08/07/2017 0905   BILITOT 0.6 06/18/2018 0815   BILITOT 0.56 08/07/2017 0905   GFRNONAA >60 06/18/2018 0815   GFRAA >60 06/18/2018 0815            ASSESSMENT and THERAPY PLAN:   Metastatic breast cancer ([place]) Metastatic breast cancer with innumerable liver metastases detected on ultrasound and recent MRI of the liver on 04/26/2016 (Prior history of left breast IDC T1 1 N0 stage IA 0.3 cm grade 1 tumor that was ER 38%, PR 93%, Ki-67 10%, HER-2 2+, no tissue for FISH testing, 0/1 lymph node negative, status post lumpectomy radiation and 5 years of Arimidex completed in February 2013)  PET/CT scan: 05/07/2016: Extensive metastatic involvement of both lobes of the liver, primary could be liver, phalangeal or metastatic disease, hypermetabolic  thoracic lymph nodes left axillary, left supraclavicular and right CP angle lymph nodes, hypermetabolic upper abdominal lymph nodes  Liver biopsy10/07/2016: Metastatic carcinoma breast primary strong positivity for CK 7 weak focal positivity for ER, GCDFP, negative for PR, CDX 2, TTF-1, WT 1; HER-2 positive  Treatment plan: 1. Taxotere Herceptin and Perjeta every 3 weeks palliative chemotherapy started 06/13/16 (Perjeta discontinued after 5 cycles due to allergy) 2. followed by Herceptin maintenance stopped 05/08/2018 due to progression ----------------------------------------------------------------------------------------------------------------------------------------- Current treatment: Kadcyla starting 05/29/2018 Patient completed palliative radiation therapy to the brain Echo on 04/21/18 shows normal EF of 55-60%  No clinical signs of progression.  1. Cardiac: repeat echo due mid December; orders placed today 2. Brain metastases: headaches resolved, tapered of off dexamethasone 3. Weight loss/decreased appetite: Encouraged increased po intake.  Will see if nutrition can see patient in chemo in December, patient declines visit with them today.  If weight continues to decrease would consider appetite stimulate such as Remeron. 4. Leg swelling: nearly resolved, doppler neg, recommended she wear her compression hose  Return to clinic in 3 weeks for labs, f/u with Dr. [name], and Steward Drone.   Orders Placed This Encounter  Procedures  . ECHOCARDIOGRAM COMPLETE    Standing Status:   Future    Standing Expiration Date:   09/26/2019    Order Specific Question:   Where should this test be performed    Answer:   [facility]    Order Specific Question:   Perflutren DEFINITY (image enhancing agent) should  be administered unless hypersensitivity or allergy exist    Answer:   Administer Perflutren    All questions were answered. The patient knows to call the clinic with any problems, questions  or concerns. We can certainly see the patient much sooner if necessary.  A total of (30) minutes of face-to-face time was spent with this patient with greater than 50% of that time in counseling and care-coordination.  This note was electronically signed. Lindsey C Causey, NP 06/18/2018 

## 2018-06-18 NOTE — Patient Instructions (Signed)
Implanted Port Home Guide An implanted port is a type of central line that is placed under the skin. Central lines are used to provide IV access when treatment or nutrition needs to be given through a person's veins. Implanted ports are used for long-term IV access. An implanted port may be placed because:  You need IV medicine that would be irritating to the small veins in your hands or arms.  You need long-term IV medicines, such as antibiotics.  You need IV nutrition for a long period.  You need frequent blood draws for lab tests.  You need dialysis.  Implanted ports are usually placed in the chest area, but they can also be placed in the upper arm, the abdomen, or the leg. An implanted port has two main parts:  Reservoir. The reservoir is round and will appear as a small, raised area under your skin. The reservoir is the part where a needle is inserted to give medicines or draw blood.  Catheter. The catheter is a thin, flexible tube that extends from the reservoir. The catheter is placed into a large vein. Medicine that is inserted into the reservoir goes into the catheter and then into the vein.  How will I care for my incision site? Do not get the incision site wet. Bathe or shower as directed by your health care provider. How is my port accessed? Special steps must be taken to access the port:  Before the port is accessed, a numbing cream can be placed on the skin. This helps numb the skin over the port site.  Your health care provider uses a sterile technique to access the port. ? Your health care provider must put on a mask and sterile gloves. ? The skin over your port is cleaned carefully with an antiseptic and allowed to dry. ? The port is gently pinched between sterile gloves, and a needle is inserted into the port.  Only "non-coring" port needles should be used to access the port. Once the port is accessed, a blood return should be checked. This helps ensure that the port  is in the vein and is not clogged.  If your port needs to remain accessed for a constant infusion, a clear (transparent) bandage will be placed over the needle site. The bandage and needle will need to be changed every week, or as directed by your health care provider.  Keep the bandage covering the needle clean and dry. Do not get it wet. Follow your health care provider's instructions on how to take a shower or bath while the port is accessed.  If your port does not need to stay accessed, no bandage is needed over the port.  What is flushing? Flushing helps keep the port from getting clogged. Follow your health care provider's instructions on how and when to flush the port. Ports are usually flushed with saline solution or a medicine called heparin. The need for flushing will depend on how the port is used.  If the port is used for intermittent medicines or blood draws, the port will need to be flushed: ? After medicines have been given. ? After blood has been drawn. ? As part of routine maintenance.  If a constant infusion is running, the port may not need to be flushed.  How long will my port stay implanted? The port can stay in for as long as your health care provider thinks it is needed. When it is time for the port to come out, surgery will be   done to remove it. The procedure is similar to the one performed when the port was put in. When should I seek immediate medical care? When you have an implanted port, you should seek immediate medical care if:  You notice a bad smell coming from the incision site.  You have swelling, redness, or drainage at the incision site.  You have more swelling or pain at the port site or the surrounding area.  You have a fever that is not controlled with medicine.  This information is not intended to replace advice given to you by your health care provider. Make sure you discuss any questions you have with your health care provider. Document  Released: 07/22/2005 Document Revised: 12/28/2015 Document Reviewed: 03/29/2013 Elsevier Interactive Patient Education  2017 [employer]  

## 2018-06-18 NOTE — Assessment & Plan Note (Addendum)
Metastatic breast cancer with innumerable liver metastases detected on ultrasound and recent MRI of the liver on 04/26/2016 (Prior history of left breast IDC T1 1 N0 stage IA 0.3 cm grade 1 tumor that was ER 38%, PR 93%, Ki-67 10%, HER-2 2+, no tissue for FISH testing, 0/1 lymph node negative, status post lumpectomy radiation and 5 years of Arimidex completed in February 2013)  PET/CT scan: 05/07/2016: Extensive metastatic involvement of both lobes of the liver, primary could be liver, phalangeal or metastatic disease, hypermetabolic thoracic lymph nodes left axillary, left supraclavicular and right CP angle lymph nodes, hypermetabolic upper abdominal lymph nodes  Liver biopsy10/07/2016: Metastatic carcinoma breast primary strong positivity for CK 7 weak focal positivity for ER, GCDFP, negative for PR, CDX 2, TTF-1, WT 1; HER-2 positive  Treatment plan: 1. Taxotere Herceptin and Perjeta every 3 weeks palliative chemotherapy started 06/13/16 (Perjeta discontinued after 5 cycles due to allergy) 2. followed by Herceptin maintenance stopped 05/08/2018 due to progression ----------------------------------------------------------------------------------------------------------------------------------------- Current treatment: Kadcyla starting 05/29/2018 Patient completed palliative radiation therapy to the brain Echo on 04/21/18 shows normal EF of 55-60%  No clinical signs of progression.  1. Cardiac: repeat echo due mid December; orders placed today 2. Brain metastases: headaches resolved, tapered of off dexamethasone 3. Weight loss/decreased appetite: Encouraged increased po intake.  Will see if nutrition can see patient in chemo in December, patient declines visit with them today.  If weight continues to decrease would consider appetite stimulate such as Remeron. 4. Leg swelling: nearly resolved, doppler neg, recommended she wear her compression hose  Return to clinic in 3 weeks for labs, f/u  with Dr. [name], and Steward Drone.

## 2018-06-19 ENCOUNTER — Other Ambulatory Visit: Payer: Medicare Other

## 2018-06-19 ENCOUNTER — Ambulatory Visit: Payer: Medicare Other

## 2018-06-19 ENCOUNTER — Ambulatory Visit: Payer: Medicare Other | Admitting: Hematology and Oncology

## 2018-06-23 ENCOUNTER — Telehealth: Payer: Self-pay | Admitting: Urology

## 2018-06-23 ENCOUNTER — Ambulatory Visit: Admission: RE | Admit: 2018-06-23 | Payer: Medicare Other | Source: Ambulatory Visit | Admitting: Urology

## 2018-06-23 NOTE — Telephone Encounter (Signed)
RS patient appt from today at 3:30 to tomorrow at 3:30 due to transportation issues. Pt agreeable. Sent inbasket to Ashlyn/Tonie

## 2018-06-24 ENCOUNTER — Encounter: Payer: Self-pay | Admitting: Urology

## 2018-06-24 ENCOUNTER — Ambulatory Visit
Admission: RE | Admit: 2018-06-24 | Discharge: 2018-06-24 | Disposition: A | Discharge status: Home / Self Care | Payer: Medicare Other | Source: Ambulatory Visit | Attending: Urology | Admitting: Urology

## 2018-06-24 ENCOUNTER — Other Ambulatory Visit: Payer: Self-pay

## 2018-06-24 VITALS — BP 144/77 | HR 90 | Temp 97.9°F | Resp 20 | Ht 65.0 in | Wt 150.2 lb

## 2018-06-24 DIAGNOSIS — C7931 Secondary malignant neoplasm of brain: Secondary | ICD-10-CM

## 2018-06-24 DIAGNOSIS — Z923 Personal history of irradiation: Secondary | ICD-10-CM | POA: Insufficient documentation

## 2018-06-24 DIAGNOSIS — Z79890 Hormone replacement therapy (postmenopausal): Secondary | ICD-10-CM | POA: Diagnosis not present

## 2018-06-24 DIAGNOSIS — Z79899 Other long term (current) drug therapy: Secondary | ICD-10-CM | POA: Insufficient documentation

## 2018-06-24 DIAGNOSIS — Z7984 Long term (current) use of oral hypoglycemic drugs: Secondary | ICD-10-CM | POA: Diagnosis not present

## 2018-06-24 NOTE — Progress Notes (Signed)
Radiation Oncology         [phone]) [phone] ________________________________  Name: Monique Figueroa MRN: [id number]  Date: 06/24/2018  DOB: [birth year]  Post Treatment Note  CC: Monique Gravel, MD  Monique Lose, MD  Diagnosis:   84 yo woman with 5 cerebellar brain metastases from breast cancer     Interval Since Last Radiation:  4 weeks  05/11/2018 - 05/22/2018: The posterior fossa was treated to 30 Gy in 10 fractions  Narrative:  The patient returns today for routine follow-up.  She tolerated radiation treatment relatively well. She reported fatigue, blurry vision in left eye, and poor short-term memory but denied headache or pain throughout treatment. Her gait remained unsteady, and she ambulated with a cane. At the end of the first week, she reported nausea and vomiting that resolved by the end of treatment. She maintained normal hand-eye coordination and no alopecia was noted. By the end of treatment, she reported new onset left leg edema without pain or fever, and denied tinnitus, dizziness, or thrush.                              On review of systems, the patient states that she is doing very well overall.  She is accompanied by her son today who reports noticing significant improvement in her energy, coordination and gait over the past week.  She did not have any difficulty tapering off of the steroids.  She has residual blurry vision in the left eye, unchanged since the time of her diagnosis.  She denies headaches, new changes in her visual or auditory acuity, dizziness, imbalance, tremor or seizure activity. She remains on Monique Figueroa which was started 05/29/18 and has a scheduled follow up with Dr. Lindi Figueroa on 07/09/18.  ALLERGIES:  is allergic to perjeta [pertuzumab] and sulfa antibiotics.  Meds: Current Outpatient Medications  Medication Sig Dispense Refill  . benzocaine (ORAJEL) 10 % mucosal gel Use as directed 1 application in the mouth or throat as needed for mouth pain.    . calcium  carbonate (TUMS - DOSED IN MG ELEMENTAL CALCIUM) 500 MG chewable tablet Chew 1 tablet by mouth daily as needed for indigestion or heartburn.    . Cholecalciferol (VITAMIN D) 2000 units tablet Take 2,000 Units by mouth daily.    . diphenhydrAMINE (BENADRYL) 25 MG tablet Take 25 mg by mouth daily as needed for allergies.    Marland Kitchen levothyroxine (SYNTHROID, LEVOTHROID) 125 MCG tablet Take 125 mcg by mouth daily before breakfast.     . metFORMIN (GLUCOPHAGE) 500 MG tablet Take 500 mg by mouth daily with breakfast.     . omeprazole (PRILOSEC OTC) 20 MG tablet Take 20 mg by mouth daily as needed (acid reflux).     . Propylene Glycol (SYSTANE COMPLETE) 0.6 % SOLN Place 1 drop into both eyes 2 (two) times daily.    . sodium chloride (OCEAN) 0.65 % SOLN nasal spray Place 1 spray into both nostrils as needed for congestion.    . ondansetron (ZOFRAN) 4 MG tablet Take 1 tablet (4 mg total) by mouth daily as needed for nausea or vomiting. (Patient not taking: Reported on 06/24/2018) 30 tablet 3  . ondansetron (ZOFRAN-ODT) 8 MG disintegrating tablet Take 1 tablet (8 mg total) by mouth every 8 (eight) hours as needed for nausea or vomiting. (Patient not taking: Reported on 06/24/2018) 30 tablet 3  . oxyCODONE-acetaminophen (PERCOCET) 5-325 MG tablet Take 1 tablet by mouth every  4 (four) hours as needed for moderate pain. (Patient not taking: Reported on 06/24/2018) 30 tablet 0  . prochlorperazine (COMPAZINE) 10 MG tablet TAKE 1 TABLET (10 MG TOTAL) BY MOUTH EVERY 6 (SIX) HOURS AS NEEDED (NAUSEA OR VOMITING). (Patient not taking: Reported on 06/24/2018) 30 tablet 1  . prochlorperazine (COMPAZINE) 25 MG suppository Place 1 suppository (25 mg total) rectally every 12 (twelve) hours as needed for nausea or vomiting. (Patient not taking: Reported on 06/24/2018) 12 suppository 0   No current facility-administered medications for this encounter.     Physical Findings:  height is 5\' 5"  (1.651 m) and weight is 150 lb 3.2 oz  (68.1 kg). Her oral temperature is 97.9 F (36.6 C). Her blood pressure is 144/77 (abnormal) and her pulse is 90. Her respiration is 20 and oxygen saturation is 99%.  Pain Assessment Pain Score: 0-No pain/10 In general this is a well appearing Caucasian female in no acute distress.  She's alert and oriented x4 and appropriate throughout the examination. Cardiopulmonary assessment is negative for acute distress and she exhibits normal effort.  EOMs are intact bilaterally and PERRLA.  Her speech is appropriate and well organized.  Strength is 5 out of 5 and equal bilaterally in the upper and lower extremities.  Sensation is intact to light touch bilaterally in the upper and lower extremities.  Lab Findings: Lab Results  Component Value Date   WBC 8.1 06/18/2018   HGB 12.5 06/18/2018   HCT 36.6 06/18/2018   MCV 96.6 06/18/2018   PLT 169 06/18/2018     Radiographic Findings: Vas [place] Lower Extremity Venous (dvt)  Result Date: 06/02/2018  Lower Venous Study Indications: Edema.  Risk Factors: Cancer Breast, liver, rectal. Performing Technologist: Monique Figueroa RVS  Examination Guidelines: A complete evaluation includes B-mode imaging, spectral Doppler, color Doppler, and power Doppler as needed of all accessible portions of each vessel. Bilateral testing is considered an integral part of a complete examination. Limited examinations for reoccurring indications may be performed as noted.  Right Venous Findings: +---+---------------+---------+-----------+----------+-------+    CompressibilityPhasicitySpontaneityPropertiesSummary +---+---------------+---------+-----------+----------+-------+ CFVFull           Yes      Yes                          +---+---------------+---------+-----------+----------+-------+ SFJFull                                                 +---+---------------+---------+-----------+----------+-------+  Left Venous Findings:  +---------+---------------+---------+-----------+----------+-------------------+          CompressibilityPhasicitySpontaneityPropertiesSummary             +---------+---------------+---------+-----------+----------+-------------------+ CFV      Full           Yes      Yes                                      +---------+---------------+---------+-----------+----------+-------------------+ SFJ      Full                                                             +---------+---------------+---------+-----------+----------+-------------------+  FV Prox  Full           Yes      Yes                                      +---------+---------------+---------+-----------+----------+-------------------+ FV Mid   Full                                                             +---------+---------------+---------+-----------+----------+-------------------+ FV DistalFull           Yes      Yes                                      +---------+---------------+---------+-----------+----------+-------------------+ PFV      Full           Yes      Yes                                      +---------+---------------+---------+-----------+----------+-------------------+ POP      Full           Yes      Yes                                      +---------+---------------+---------+-----------+----------+-------------------+ PTV      Full                                                             +---------+---------------+---------+-----------+----------+-------------------+ PERO     Full                                         difficult to image                                                        due to edema        +---------+---------------+---------+-----------+----------+-------------------+    Summary: Right: Thee is no evidence of a common femoral vein obstruction. Left: There is no evidence of deep vein thrombosis in the lower extremity. No cystic  structure found in the popliteal fossa.  *See table(s) above for measurements and observations. Electronically signed by Harold Barban MD on 06/02/2018 at 5:41:14 PM.    Final     Impression/Plan: 65. 84 yo woman with 5 cerebellar brain metastases from breast cancer. She appears to be recovering well from the effects of her recent radiotherapy.  She is currently without complaints and encouraged by her progress to date.  We discussed the plan to obtain a  follow-up MRI brain in approximately 2 months to assess her treatment response.  We will see her back in the office following this scan to review results and recommendations from multidisciplinary brain conference.  Pending this exam is stable, we will continue with serial MRI brain scans every 3 months to monitor for disease recurrence or progression.    She will also continue in routine follow-up under the care and direction of Dr. Lindi Figueroa.  She and her son are comfortable with and in agreement with this plan.  She knows to call us at anytime with any concerns or questions regarding her recent radiotherapy.    Nicholos Johns, PA-C

## 2018-07-08 NOTE — Progress Notes (Signed)
Patient Care Team: [name], MD as PCP - General (Internal Medicine)  DIAGNOSIS:    ICD-10-CM   1. Metastatic breast cancer ([place]) C50.919     SUMMARY OF ONCOLOGIC HISTORY:   Metastatic breast cancer (Monique Figueroa)   06/05/2006 Initial Diagnosis    Left breast biopsy: DCIS with apocrine features ER 30%, PR 13%    06/19/2006 Surgery    Left lumpectomy: stage I, pT1a, pN0(i)(sn), pMX, 0.3 cm IDC, grade 1, with extensive high-grade DCIS , margins negative ER 38 %, PR 93 %, Ki-67 10%, HER-2/neu 2+, no additional tissue available for FISH testing, with 0/1 left axillary lymph nodes.    07/30/2006 - 09/17/2006 Radiation Therapy    Adjuvant radiation therapy    09/24/2006 - 09/25/2011 Anti-estrogen oral therapy    Antiestrogen therapy with Arimidex 5 years    04/26/2016 Relapse/Recurrence    Innumerable hepatic lesions seen in both lobes most are 1-2 cm size somewhat confluent largest in the posterior right hepatic dome 3 x 4.6 cm    05/07/2016 PET scan    PET/CT scan: Extensive metastatic involvement of both lobes of the liver, primary could be liver, phalangeal or metastatic disease, hypermetabolic thoracic LN left axillary, left supraclavicular and right CP angle LN, hypermetabolic upper abdominal LN    05/16/2016 Initial Biopsy    Liver biopsy: Metastatic carcinoma breast primary strong positivity for CK 7 weak focal positivity for ER, GCDFP, negative for PR, CDX 2, TTF-1, WT 1; HER-2 positive    06/13/2016 - 11/07/2016 Chemotherapy    Taxotere Herceptin and Perjeta every 3 weeks (Taxotere was held for elevated LFTs for cycles 1 and 2)     11/28/2016 - 05/06/2018 Chemotherapy    Herceptin maintenance therapy for metastatic breast cancer every 3 weeks     03/24/2018 Surgery    Chronic cholecystitis: Diagnostic laparoscopy: Due to intermittent attacks of biliary colic: The gallbladder was rockhard and not distensible with tumor palpated in the porta hepatis, extensive lucent liver lesions,  decision was made to stop the surgery.    05/12/2018 - 05/22/2018 Radiation Therapy    Radiation to the brain    05/29/2018 -  Chemotherapy    Kadcyla      CHIEF COMPLIANT: Follow-up for third Kadcyla treatment  INTERVAL HISTORY: Monique Figueroa is a 84 y.o. with above-mentioned history of metastatic breast cancer who was on long-term control with Herceptin and then started developing progressive metastatic disease in the liver as well as the brain. She presents to the clinic today with her son to check recent labs and for her third Kadcyla treatment. She reports occasional nausea, which she attributes to eating a lot of holiday food. Her CBC from today shows WBC 4.3, Hg 11.9, and other counts WNL. Her son notes she has gained back a few pounds after losing several prior to the last visit.   REVIEW OF SYSTEMS:   Constitutional: Denies fevers, chills or abnormal weight loss Eyes: Denies blurriness of vision Ears, nose, mouth, throat, and face: Denies mucositis or sore throat Respiratory: Denies cough, dyspnea or wheezes Cardiovascular: Denies palpitation, chest discomfort Gastrointestinal:  Denies heartburn or change in bowel habits (+) occasional nausea Skin: Denies abnormal skin rashes Lymphatics: Denies new lymphadenopathy or easy bruising Neurological:Denies numbness, tingling or new weaknesses Behavioral/Psych: Mood is stable, no new changes  Extremities: No lower extremity edema Breast: denies any pain or lumps or nodules in either breasts All other systems were reviewed with the patient and are negative.  I  have reviewed the past medical history, past surgical history, social history and family history with the patient and they are unchanged from previous note.  ALLERGIES:  is allergic to perjeta [pertuzumab] and sulfa antibiotics.  MEDICATIONS:  Current Outpatient Medications  Medication Sig Dispense Refill  . benzocaine (ORAJEL) 10 % mucosal gel Use as directed 1  application in the mouth or throat as needed for mouth pain.    . calcium carbonate (TUMS - DOSED IN MG ELEMENTAL CALCIUM) 500 MG chewable tablet Chew 1 tablet by mouth daily as needed for indigestion or heartburn.    . Cholecalciferol (VITAMIN D) 2000 units tablet Take 2,000 Units by mouth daily.    . diphenhydrAMINE (BENADRYL) 25 MG tablet Take 25 mg by mouth daily as needed for allergies.    Marland Kitchen levothyroxine (SYNTHROID, LEVOTHROID) 125 MCG tablet Take 125 mcg by mouth daily before breakfast.     . metFORMIN (GLUCOPHAGE) 500 MG tablet Take 500 mg by mouth daily with breakfast.     . omeprazole (PRILOSEC OTC) 20 MG tablet Take 20 mg by mouth daily as needed (acid reflux).     . ondansetron (ZOFRAN) 4 MG tablet Take 1 tablet (4 mg total) by mouth daily as needed for nausea or vomiting. (Patient not taking: Reported on 06/24/2018) 30 tablet 3  . ondansetron (ZOFRAN-ODT) 8 MG disintegrating tablet Take 1 tablet (8 mg total) by mouth every 8 (eight) hours as needed for nausea or vomiting. (Patient not taking: Reported on 06/24/2018) 30 tablet 3  . oxyCODONE-acetaminophen (PERCOCET) 5-325 MG tablet Take 1 tablet by mouth every 4 (four) hours as needed for moderate pain. (Patient not taking: Reported on 06/24/2018) 30 tablet 0  . prochlorperazine (COMPAZINE) 10 MG tablet TAKE 1 TABLET (10 MG TOTAL) BY MOUTH EVERY 6 (SIX) HOURS AS NEEDED (NAUSEA OR VOMITING). (Patient not taking: Reported on 06/24/2018) 30 tablet 1  . prochlorperazine (COMPAZINE) 25 MG suppository Place 1 suppository (25 mg total) rectally every 12 (twelve) hours as needed for nausea or vomiting. (Patient not taking: Reported on 06/24/2018) 12 suppository 0  . Propylene Glycol (SYSTANE COMPLETE) 0.6 % SOLN Place 1 drop into both eyes 2 (two) times daily.    . sodium chloride (OCEAN) 0.65 % SOLN nasal spray Place 1 spray into both nostrils as needed for congestion.     No current facility-administered medications for this visit.      PHYSICAL EXAMINATION: ECOG PERFORMANCE STATUS: 1 - Symptomatic but completely ambulatory  Vitals:   07/09/18 0833  BP: 125/69  Pulse: 80  Resp: 17  Temp: 97.7 F (36.5 C)  SpO2: 98%   Filed Weights   07/09/18 0833  Weight: 146 lb 12.8 oz (66.6 kg)    GENERAL:alert, no distress and comfortable SKIN: skin color, texture, turgor are normal, no rashes or significant lesions EYES: normal, Conjunctiva are pink and non-injected, sclera clear OROPHARYNX:no exudate, no erythema and lips, buccal mucosa, and tongue normal  NECK: supple, thyroid normal size, non-tender, without nodularity LYMPH:  no palpable lymphadenopathy in the cervical, axillary or inguinal LUNGS: clear to auscultation and percussion with normal breathing effort HEART: regular rate & rhythm and no murmurs and no lower extremity edema ABDOMEN:abdomen soft, non-tender and normal bowel sounds MUSCULOSKELETAL:no cyanosis of digits and no clubbing  NEURO: alert & oriented x 3 with fluent speech, no focal motor/sensory deficits EXTREMITIES: No lower extremity edema  LABORATORY DATA:  I have reviewed the data as listed CMP Latest Ref Rng & Units 06/18/2018 05/29/2018 05/06/2018  Glucose 70 - 99 mg/dL 166(H) 124(H) 116(H)  BUN 8 - 23 mg/dL '11 22 10  '$ Creatinine 0.44 - 1.00 mg/dL 0.71 0.71 0.85  Sodium 135 - 145 mmol/L 140 138 139  Potassium 3.5 - 5.1 mmol/L 3.0(LL) 3.9 3.9  Chloride 98 - 111 mmol/L 105 102 103  CO2 22 - 32 mmol/L '25 26 26  '$ Calcium 8.9 - 10.3 mg/dL 7.7(L) 8.4(L) 9.1  Total Protein 6.5 - 8.1 g/dL 6.1(L) 6.0(L) 7.0  Total Bilirubin 0.3 - 1.2 mg/dL 0.6 1.3(H) 2.4(H)  Alkaline Phos 38 - 126 U/L 80 111 256(H)  AST 15 - 41 U/L 19 22 32  ALT 0 - 44 U/L '10 24 19    '$ Lab Results  Component Value Date   WBC 4.3 07/09/2018   HGB 11.9 (L) 07/09/2018   HCT 35.8 (L) 07/09/2018   MCV 98.9 07/09/2018   PLT 196 07/09/2018   NEUTROABS 3.1 07/09/2018    ASSESSMENT & PLAN:  Metastatic breast cancer  ([place]) Metastatic breast cancer with innumerable liver metastases detected on ultrasound and recent MRI of the liver on 04/26/2016 (Prior history of left breast IDC T1 1 N0 stage IA 0.3 cm grade 1 tumor that was ER 38%, PR 93%, Ki-67 10%, HER-2 2+, no tissue for FISH testing, 0/1 lymph node negative, status post lumpectomy radiation and 5 years of Arimidex completed in February 2013)  PET/CT scan: 05/07/2016: Extensive metastatic involvement of both lobes of the liver, primary could be liver, phalangeal or metastatic disease, hypermetabolic thoracic lymph nodes left axillary, left supraclavicular and right CP angle lymph nodes, hypermetabolic upper abdominal lymph nodes  Liver biopsy10/07/2016: Metastatic carcinoma breast primary strong positivity for CK 7 weak focal positivity for ER, GCDFP, negative for PR, CDX 2, TTF-1, WT 1; HER-2 positive  Treatment plan: 1. Taxotere Herceptin and Perjeta every 3 weeks palliative chemotherapy started 06/13/16 (Perjeta discontinued after 5 cycles due to allergy) 2. followed by Herceptin maintenance stopped 05/08/2018 due to progression ----------------------------------------------------------------------------------------------------------------------------------------- Current treatment: Kadcyla starting 05/29/2018 today is cycle 3 Patient completed palliative radiation therapy to the brain Echo on 04/21/18 shows normal EF of 55-60%  Kadcyla toxicities: No side effects to Kadcyla.  Brain metastases: Status post radiation Decrease weight and appetite Plan to do scans end of January 2020 Return to clinic every 3 weeks for labs and follow-up and Kadcyla     No orders of the defined types were placed in this encounter.  The patient has a good understanding of the overall plan. she agrees with it. she will call with any problems that may develop before the next visit here.  Nicholas Lose, MD 07/09/2018   I, Cloyde Reams Dorshimer, am acting as scribe for  Nicholas Lose, MD.  I have reviewed the above documentation for accuracy and completeness, and I agree with the above.

## 2018-07-09 ENCOUNTER — Telehealth: Payer: Self-pay

## 2018-07-09 ENCOUNTER — Inpatient Hospital Stay: Payer: Medicare Other | Attending: Hematology and Oncology

## 2018-07-09 ENCOUNTER — Telehealth: Payer: Self-pay | Admitting: Hematology and Oncology

## 2018-07-09 ENCOUNTER — Inpatient Hospital Stay: Payer: Medicare Other

## 2018-07-09 ENCOUNTER — Inpatient Hospital Stay: Payer: Medicare Other | Admitting: Nutrition

## 2018-07-09 ENCOUNTER — Inpatient Hospital Stay: Payer: Medicare Other | Admitting: Hematology and Oncology

## 2018-07-09 VITALS — BP 139/74 | HR 75 | Resp 14

## 2018-07-09 DIAGNOSIS — Z5112 Encounter for antineoplastic immunotherapy: Secondary | ICD-10-CM | POA: Diagnosis not present

## 2018-07-09 DIAGNOSIS — R634 Abnormal weight loss: Secondary | ICD-10-CM | POA: Insufficient documentation

## 2018-07-09 DIAGNOSIS — Z7984 Long term (current) use of oral hypoglycemic drugs: Secondary | ICD-10-CM

## 2018-07-09 DIAGNOSIS — Z923 Personal history of irradiation: Secondary | ICD-10-CM | POA: Insufficient documentation

## 2018-07-09 DIAGNOSIS — C787 Secondary malignant neoplasm of liver and intrahepatic bile duct: Secondary | ICD-10-CM | POA: Insufficient documentation

## 2018-07-09 DIAGNOSIS — C50912 Malignant neoplasm of unspecified site of left female breast: Secondary | ICD-10-CM | POA: Diagnosis not present

## 2018-07-09 DIAGNOSIS — C50919 Malignant neoplasm of unspecified site of unspecified female breast: Secondary | ICD-10-CM

## 2018-07-09 DIAGNOSIS — C7931 Secondary malignant neoplasm of brain: Secondary | ICD-10-CM | POA: Diagnosis not present

## 2018-07-09 DIAGNOSIS — Z79899 Other long term (current) drug therapy: Secondary | ICD-10-CM | POA: Insufficient documentation

## 2018-07-09 DIAGNOSIS — Z9221 Personal history of antineoplastic chemotherapy: Secondary | ICD-10-CM | POA: Diagnosis not present

## 2018-07-09 DIAGNOSIS — Z95828 Presence of other vascular implants and grafts: Secondary | ICD-10-CM

## 2018-07-09 DIAGNOSIS — R630 Anorexia: Secondary | ICD-10-CM

## 2018-07-09 DIAGNOSIS — Z170 Estrogen receptor positive status [ER+]: Secondary | ICD-10-CM | POA: Diagnosis not present

## 2018-07-09 LAB — CMP (CANCER CENTER ONLY)
ALBUMIN: 3.1 g/dL — AB (ref 3.5–5.0)
ALT: 9 U/L (ref 0–44)
AST: 26 U/L (ref 15–41)
Alkaline Phosphatase: 61 U/L (ref 38–126)
Anion gap: 11 (ref 5–15)
BUN: 9 mg/dL (ref 8–23)
CHLORIDE: 106 mmol/L (ref 98–111)
CO2: 23 mmol/L (ref 22–32)
Calcium: 8.8 mg/dL — ABNORMAL LOW (ref 8.9–10.3)
Creatinine: 0.68 mg/dL (ref 0.44–1.00)
GFR, Est AFR Am: >60 mL/min (ref >60)
Glucose, Bld: 94 mg/dL (ref 70–99)
POTASSIUM: 3.5 mmol/L (ref 3.5–5.1)
SODIUM: 140 mmol/L (ref 135–145)
Total Bilirubin: 0.7 mg/dL (ref 0.3–1.2)
Total Protein: 6.6 g/dL (ref 6.5–8.1)

## 2018-07-09 LAB — CBC WITH DIFFERENTIAL (CANCER CENTER ONLY)
Abs Immature Granulocytes: 0.01 10*3/uL (ref 0.00–0.07)
BASOS ABS: 0.1 10*3/uL (ref 0.0–0.1)
BASOS PCT: 1 %
EOS ABS: 0.1 10*3/uL (ref 0.0–0.5)
Eosinophils Relative: 1 %
HCT: 35.8 % — ABNORMAL LOW (ref 36.0–46.0)
Hemoglobin: 11.9 g/dL — ABNORMAL LOW (ref 12.0–15.0)
IMMATURE GRANULOCYTES: 0 %
Lymphocytes Relative: 16 %
Lymphs Abs: 0.7 10*3/uL (ref 0.7–4.0)
MCH: 32.9 pg (ref 26.0–34.0)
MCHC: 33.2 g/dL (ref 30.0–36.0)
MCV: 98.9 fL (ref 80.0–100.0)
Monocytes Absolute: 0.5 10*3/uL (ref 0.1–1.0)
Monocytes Relative: 10 %
NEUTROS PCT: 72 %
Neutro Abs: 3.1 10*3/uL (ref 1.7–7.7)
PLATELETS: 196 10*3/uL (ref 150–400)
RBC: 3.62 MIL/uL — AB (ref 3.87–5.11)
RDW: 13.7 % (ref 11.5–15.5)
WBC: 4.3 10*3/uL (ref 4.0–10.5)
nRBC: 0 % (ref 0.0–0.2)

## 2018-07-09 MED ORDER — ACETAMINOPHEN 325 MG PO TABS
650.0000 mg | ORAL_TABLET | Freq: Once | ORAL | Status: AC
  Administered 2018-07-09: 650 mg via ORAL

## 2018-07-09 MED ORDER — ACETAMINOPHEN 325 MG PO TABS
ORAL_TABLET | ORAL | Status: AC
  Filled 2018-07-09: qty 2

## 2018-07-09 MED ORDER — SODIUM CHLORIDE 0.9% FLUSH
10.0000 mL | INTRAVENOUS | Status: DC | PRN
  Administered 2018-07-09: 10 mL
  Filled 2018-07-09: qty 10

## 2018-07-09 MED ORDER — DIPHENHYDRAMINE HCL 25 MG PO CAPS
50.0000 mg | ORAL_CAPSULE | Freq: Once | ORAL | Status: AC
  Administered 2018-07-09: 50 mg via ORAL

## 2018-07-09 MED ORDER — SODIUM CHLORIDE 0.9% FLUSH
10.0000 mL | INTRAVENOUS | Status: DC | PRN
  Administered 2018-07-09: 10 mL via INTRAVENOUS
  Filled 2018-07-09: qty 10

## 2018-07-09 MED ORDER — DIPHENHYDRAMINE HCL 25 MG PO CAPS
ORAL_CAPSULE | ORAL | Status: AC
  Filled 2018-07-09: qty 2

## 2018-07-09 MED ORDER — HEPARIN SOD (PORK) LOCK FLUSH 100 UNIT/ML IV SOLN
500.0000 [IU] | Freq: Once | INTRAVENOUS | Status: DC | PRN
  Filled 2018-07-09: qty 5

## 2018-07-09 MED ORDER — SODIUM CHLORIDE 0.9 % IV SOLN
Freq: Once | INTRAVENOUS | Status: AC
  Administered 2018-07-09: 09:00:00 via INTRAVENOUS
  Filled 2018-07-09: qty 250

## 2018-07-09 MED ORDER — SODIUM CHLORIDE 0.9 % IV SOLN
3.0000 mg/kg | Freq: Once | INTRAVENOUS | Status: AC
  Administered 2018-07-09: 200 mg via INTRAVENOUS
  Filled 2018-07-09: qty 10

## 2018-07-09 NOTE — Telephone Encounter (Signed)
-----   Message from Irving Copas., MD sent at 07/09/2018  1:19 PM EST ----- Thanks for review and follow up. I'm glad what we did has helped. I suspect the stent has been helpful. I did dilate multiple regions of strictures. I can see her back. If she has done well with the current stenting, we can determine possible transition to a more permanent intraductal stent that we can order. I am a bit concerned about ability to place multiple stents in the region and so I wouldn't want to jail off another region. Let me see her in clinic and discuss next steps. Angee Gupton, let's have patient return in December or January to clinic to discuss stent interrogation and exchange options. Gabe. ----- Message ----- From: Fanny Skates, MD Sent: 07/09/2018   9:40 AM EST To: Fanny Skates, MD, Nicholas Lose, MD, #  Salley Scarlet, Loleta Dicker,  I will refer you to Dr. Donneta Romberg ERCP note dated April 20, 2018. He placed a stent in the common hepatic duct.  He recommends follow-up 3 months post procedure for possible exchange of stent and reassessment of biliary tree  I suspect we should check her liver function tests every 2-3 months, sooner if she develops an pain or fever. If she does not have an appointment with Dr. Rush Landmark , that she should be referred back to him sometime in December which would be 3 months since the original ERCP  Hope that helps.  [place] ----- Message ----- From: Nicholas Lose, MD Sent: 07/09/2018   8:44 AM EST To: Fanny Skates, MD  Renelda Loma, Ms Wales is doing well with regards to her jaundice. IS there anything that she needs to undergo? Does the stent need changing or anything like that? Thanks [employer]

## 2018-07-09 NOTE — Progress Notes (Signed)
84 year old female diagnosed with metastatic breast cancer.  Patient is receiving Kadcycla. She is a patient of Dr. [name].  Past medical history includes anemia, UTI, thyroid cancer, hypothyroidism, hypertension, GERD, and diabetes.  Medications include vitamin D, Decadron, Synthroid, Glucophage, Prilosec, Zofran, and Compazine.  Labs include albumin 3.1.  Height: 65 inches. Weight: 146.8 pounds. Usual body weight: 177 pounds in March 2019. BMI: 24.06.  Patient reports that she has had a decreased appetite but generally eats 3 meals daily. She has tried Ensure and states that she drinks Ensure clear currently. Patient endorses weight loss but has put on 2 pounds since last visit.  Nutrition diagnosis: Inadequate oral intake related to metastatic breast cancer and associated treatments as evidenced by 17% weight loss since March 2019.  Intervention: Patient was educated to add 3 snacks between meals for added calories and protein. Reviewed high-calorie high-protein fact sheets and provided patient with copy. Recommended patient try to switch to Ensure Enlive instead of Ensure clear and provided coupons. Questions were answered.  Teach back method used.  Contact information provided.  Monitoring, evaluation, goals: Patient will tolerate increased calories and protein to promote weight stabilization.  Next visit: Thursday, January 16 during infusion.  **Disclaimer: This note was dictated with voice recognition software. Similar sounding words can inadvertently be transcribed and this note may contain transcription errors which may not have been corrected upon publication of note.**

## 2018-07-09 NOTE — Telephone Encounter (Signed)
No 1/25 los.  

## 2018-07-09 NOTE — Assessment & Plan Note (Signed)
Metastatic breast cancer with innumerable liver metastases detected on ultrasound and recent MRI of the liver on 04/26/2016 (Prior history of left breast IDC T1 1 N0 stage IA 0.3 cm grade 1 tumor that was ER 38%, PR 93%, Ki-67 10%, HER-2 2+, no tissue for FISH testing, 0/1 lymph node negative, status post lumpectomy radiation and 5 years of Arimidex completed in February 2013)  PET/CT scan: 05/07/2016: Extensive metastatic involvement of both lobes of the liver, primary could be liver, phalangeal or metastatic disease, hypermetabolic thoracic lymph nodes left axillary, left supraclavicular and right CP angle lymph nodes, hypermetabolic upper abdominal lymph nodes  Liver biopsy10/07/2016: Metastatic carcinoma breast primary strong positivity for CK 7 weak focal positivity for ER, GCDFP, negative for PR, CDX 2, TTF-1, WT 1; HER-2 positive  Treatment plan: 1. Taxotere Herceptin and Perjeta every 3 weeks palliative chemotherapy started 06/13/16 (Perjeta discontinued after 5 cycles due to allergy) 2. followed by Herceptin maintenance stopped 05/08/2018 due to progression ----------------------------------------------------------------------------------------------------------------------------------------- Current treatment: Kadcyla starting 05/29/2018 Patient completed palliative radiation therapy to the brain Echo on 04/21/18 shows normal EF of 55-60%  Kadcyla toxicities: No side effects to Kadcyla.  Brain metastases: Status post radiation Decrease weight and appetite  Return to clinic every 3 weeks for labs and follow-up and Kadcyla

## 2018-07-09 NOTE — Patient Instructions (Signed)
[facility] Discharge Instructions for Patients Receiving Chemotherapy  Today you received the following chemotherapy agents :  Kadcyla.  To help prevent nausea and vomiting after your treatment, we encourage you to take your nausea medication as prescribed.   If you develop nausea and vomiting that is not controlled by your nausea medication, call the clinic.   BELOW ARE SYMPTOMS THAT SHOULD BE REPORTED IMMEDIATELY:  *FEVER GREATER THAN 100.5 F  *CHILLS WITH OR WITHOUT FEVER  NAUSEA AND VOMITING THAT IS NOT CONTROLLED WITH YOUR NAUSEA MEDICATION  *UNUSUAL SHORTNESS OF BREATH  *UNUSUAL BRUISING OR BLEEDING  TENDERNESS IN MOUTH AND THROAT WITH OR WITHOUT PRESENCE OF ULCERS  *URINARY PROBLEMS  *BOWEL PROBLEMS  UNUSUAL RASH Items with * indicate a potential emergency and should be followed up as soon as possible.  Feel free to call the clinic should you have any questions or concerns. The clinic phone number is [phone].  Please show the CHEMO ALERT CARD at check-in to the Emergency Department and triage nurse.   

## 2018-07-09 NOTE — Telephone Encounter (Signed)
08/11/2018 at 9:45 am appt with Dr Rush Landmark.  Appt sent to the pt via My Chart

## 2018-07-20 ENCOUNTER — Ambulatory Visit (HOSPITAL_COMMUNITY)
Admission: RE | Admit: 2018-07-20 | Discharge: 2018-07-20 | Disposition: A | Discharge status: Home / Self Care | Payer: Medicare Other | Source: Ambulatory Visit | Attending: Adult Health | Admitting: Adult Health

## 2018-07-20 DIAGNOSIS — C7931 Secondary malignant neoplasm of brain: Secondary | ICD-10-CM | POA: Diagnosis not present

## 2018-07-20 DIAGNOSIS — C787 Secondary malignant neoplasm of liver and intrahepatic bile duct: Secondary | ICD-10-CM | POA: Diagnosis not present

## 2018-07-20 DIAGNOSIS — C50919 Malignant neoplasm of unspecified site of unspecified female breast: Secondary | ICD-10-CM | POA: Diagnosis not present

## 2018-07-20 DIAGNOSIS — I083 Combined rheumatic disorders of mitral, aortic and tricuspid valves: Secondary | ICD-10-CM | POA: Insufficient documentation

## 2018-07-20 NOTE — Progress Notes (Signed)
  Echocardiogram 2D Echocardiogram has been performed.  Monique Figueroa 07/20/2018, 10:37 AM

## 2018-07-28 ENCOUNTER — Other Ambulatory Visit: Payer: Self-pay | Admitting: Radiation Therapy

## 2018-07-28 DIAGNOSIS — C7931 Secondary malignant neoplasm of brain: Secondary | ICD-10-CM

## 2018-07-28 DIAGNOSIS — C7949 Secondary malignant neoplasm of other parts of nervous system: Principal | ICD-10-CM

## 2018-07-29 NOTE — Assessment & Plan Note (Signed)
Metastatic breast cancer with innumerable liver metastases detected on ultrasound and recent MRI of the liver on 04/26/2016 (Prior history of left breast IDC T1 1 N0 stage IA 0.3 cm grade 1 tumor that was ER 38%, PR 93%, Ki-67 10%, HER-2 2+, no tissue for FISH testing, 0/1 lymph node negative, status post lumpectomy radiation and 5 years of Arimidex completed in February 2013)  PET/CT scan: 05/07/2016: Extensive metastatic involvement of both lobes of the liver, primary could be liver, phalangeal or metastatic disease, hypermetabolic thoracic lymph nodes left axillary, left supraclavicular and right CP angle lymph nodes, hypermetabolic upper abdominal lymph nodes  Liver biopsy10/07/2016: Metastatic carcinoma breast primary strong positivity for CK 7 weak focal positivity for ER, GCDFP, negative for PR, CDX 2, TTF-1, WT 1; HER-2 positive  Treatment plan: 1. Taxotere Herceptin and Perjeta every 3 weeks palliative chemotherapy started 06/13/16 (Perjeta discontinued after 5 cycles due to allergy) 2. followed by Herceptin maintenance stopped 05/08/2018 due to progression ----------------------------------------------------------------------------------------------------------------------------------------- Current treatment: Kadcyla starting 05/29/2018 today is cycle 3 Patient completed palliative radiation therapy to the brain Echo on 04/21/18 shows normal EF of 55-60%  Kadcyla toxicities: No side effects to Kadcyla.  Brain metastases: Status post radiation Decrease weight and appetite Plan to do scans end of January 2020 Return to clinic every 3 weeks for labs and follow-up and Kadcyla     No orders of the defined types were placed in this encounter.  The patient has a good understanding of the overall plan. she agrees with it. she will call with any problems that may develop before the next visit here.  Nicholas Lose, MD 07/09/2018

## 2018-07-30 ENCOUNTER — Inpatient Hospital Stay: Payer: Medicare Other | Admitting: Hematology and Oncology

## 2018-07-30 ENCOUNTER — Inpatient Hospital Stay: Payer: Medicare Other

## 2018-07-30 ENCOUNTER — Other Ambulatory Visit: Payer: Self-pay | Admitting: Hematology and Oncology

## 2018-07-30 VITALS — BP 143/70 | HR 80 | Resp 16

## 2018-07-30 DIAGNOSIS — R634 Abnormal weight loss: Secondary | ICD-10-CM

## 2018-07-30 DIAGNOSIS — Z923 Personal history of irradiation: Secondary | ICD-10-CM

## 2018-07-30 DIAGNOSIS — C50919 Malignant neoplasm of unspecified site of unspecified female breast: Secondary | ICD-10-CM

## 2018-07-30 DIAGNOSIS — Z170 Estrogen receptor positive status [ER+]: Secondary | ICD-10-CM

## 2018-07-30 DIAGNOSIS — E876 Hypokalemia: Secondary | ICD-10-CM

## 2018-07-30 DIAGNOSIS — C787 Secondary malignant neoplasm of liver and intrahepatic bile duct: Secondary | ICD-10-CM | POA: Diagnosis not present

## 2018-07-30 DIAGNOSIS — C50912 Malignant neoplasm of unspecified site of left female breast: Secondary | ICD-10-CM | POA: Diagnosis not present

## 2018-07-30 DIAGNOSIS — Z5112 Encounter for antineoplastic immunotherapy: Secondary | ICD-10-CM

## 2018-07-30 DIAGNOSIS — Z7984 Long term (current) use of oral hypoglycemic drugs: Secondary | ICD-10-CM | POA: Diagnosis not present

## 2018-07-30 DIAGNOSIS — R630 Anorexia: Secondary | ICD-10-CM

## 2018-07-30 DIAGNOSIS — Z9221 Personal history of antineoplastic chemotherapy: Secondary | ICD-10-CM

## 2018-07-30 DIAGNOSIS — Z79899 Other long term (current) drug therapy: Secondary | ICD-10-CM

## 2018-07-30 DIAGNOSIS — C7931 Secondary malignant neoplasm of brain: Secondary | ICD-10-CM | POA: Diagnosis not present

## 2018-07-30 LAB — CBC WITH DIFFERENTIAL (CANCER CENTER ONLY)
Abs Immature Granulocytes: 0.01 10*3/uL (ref 0.00–0.07)
BASOS ABS: 0 10*3/uL (ref 0.0–0.1)
Basophils Relative: 1 %
Eosinophils Absolute: 0.1 10*3/uL (ref 0.0–0.5)
Eosinophils Relative: 1 %
HCT: 37.1 % (ref 36.0–46.0)
Hemoglobin: 12.1 g/dL (ref 12.0–15.0)
Immature Granulocytes: 0 %
Lymphocytes Relative: 12 %
Lymphs Abs: 0.5 10*3/uL — ABNORMAL LOW (ref 0.7–4.0)
MCH: 31.9 pg (ref 26.0–34.0)
MCHC: 32.6 g/dL (ref 30.0–36.0)
MCV: 97.9 fL (ref 80.0–100.0)
Monocytes Absolute: 0.5 10*3/uL (ref 0.1–1.0)
Monocytes Relative: 11 %
Neutro Abs: 3.2 10*3/uL (ref 1.7–7.7)
Neutrophils Relative %: 75 %
Platelet Count: 159 10*3/uL (ref 150–400)
RBC: 3.79 MIL/uL — ABNORMAL LOW (ref 3.87–5.11)
RDW: 13.7 % (ref 11.5–15.5)
WBC Count: 4.4 10*3/uL (ref 4.0–10.5)
nRBC: 0 % (ref 0.0–0.2)

## 2018-07-30 LAB — CMP (CANCER CENTER ONLY)
ALT: 9 U/L (ref 0–44)
ANION GAP: 8 (ref 5–15)
AST: 22 U/L (ref 15–41)
Albumin: 2.8 g/dL — ABNORMAL LOW (ref 3.5–5.0)
Alkaline Phosphatase: 45 U/L (ref 38–126)
BUN: 11 mg/dL (ref 8–23)
CHLORIDE: 112 mmol/L — AB (ref 98–111)
CO2: 21 mmol/L — ABNORMAL LOW (ref 22–32)
Calcium: 7.3 mg/dL — ABNORMAL LOW (ref 8.9–10.3)
Creatinine: 0.59 mg/dL (ref 0.44–1.00)
GFR, Est AFR Am: >60 mL/min (ref >60)
GFR, Estimated: >60 mL/min (ref >60)
Glucose, Bld: 89 mg/dL (ref 70–99)
POTASSIUM: 2.9 mmol/L — AB (ref 3.5–5.1)
Sodium: 141 mmol/L (ref 135–145)
Total Bilirubin: 0.6 mg/dL (ref 0.3–1.2)
Total Protein: 5.6 g/dL — ABNORMAL LOW (ref 6.5–8.1)

## 2018-07-30 MED ORDER — POTASSIUM CHLORIDE CRYS ER 20 MEQ PO TBCR
EXTENDED_RELEASE_TABLET | ORAL | Status: AC
  Filled 2018-07-30: qty 2

## 2018-07-30 MED ORDER — HEPARIN SOD (PORK) LOCK FLUSH 100 UNIT/ML IV SOLN
500.0000 [IU] | Freq: Once | INTRAVENOUS | Status: AC | PRN
  Administered 2018-07-30: 500 [IU]
  Filled 2018-07-30: qty 5

## 2018-07-30 MED ORDER — DIPHENHYDRAMINE HCL 25 MG PO CAPS
50.0000 mg | ORAL_CAPSULE | Freq: Once | ORAL | Status: AC
  Administered 2018-07-30: 50 mg via ORAL

## 2018-07-30 MED ORDER — ACETAMINOPHEN 325 MG PO TABS
650.0000 mg | ORAL_TABLET | Freq: Once | ORAL | Status: AC
  Administered 2018-07-30: 650 mg via ORAL

## 2018-07-30 MED ORDER — POTASSIUM CHLORIDE CRYS ER 20 MEQ PO TBCR: 20.0000 meq | EXTENDED_RELEASE_TABLET | Freq: Every day | ORAL | 0 refills | Status: DC

## 2018-07-30 MED ORDER — SODIUM CHLORIDE 0.9 % IV SOLN
3.0000 mg/kg | Freq: Once | INTRAVENOUS | Status: AC
  Administered 2018-07-30: 200 mg via INTRAVENOUS
  Filled 2018-07-30: qty 10

## 2018-07-30 MED ORDER — ACETAMINOPHEN 325 MG PO TABS
ORAL_TABLET | ORAL | Status: AC
  Filled 2018-07-30: qty 2

## 2018-07-30 MED ORDER — POTASSIUM CHLORIDE CRYS ER 20 MEQ PO TBCR
40.0000 meq | EXTENDED_RELEASE_TABLET | Freq: Once | ORAL | Status: AC
  Administered 2018-07-30: 40 meq via ORAL

## 2018-07-30 MED ORDER — SODIUM CHLORIDE 0.9 % IV SOLN
Freq: Once | INTRAVENOUS | Status: DC
  Filled 2018-07-30: qty 250

## 2018-07-30 MED ORDER — SODIUM CHLORIDE 0.9% FLUSH
10.0000 mL | INTRAVENOUS | Status: DC | PRN
  Administered 2018-07-30: 10 mL
  Filled 2018-07-30: qty 10

## 2018-07-30 MED ORDER — DIPHENHYDRAMINE HCL 25 MG PO CAPS
ORAL_CAPSULE | ORAL | Status: AC
  Filled 2018-07-30: qty 2

## 2018-07-30 NOTE — Progress Notes (Signed)
Okay to treat with K+ of 2.9 today per Dr. [name].  Verbal orders given for Potassium 40 mEq today while in infusion.  Patient to continue Potassium 20 mEq daily at home.  Infusion nurse notified.  Orders placed.

## 2018-07-30 NOTE — Progress Notes (Signed)
Patient Care Team: [name], MD as PCP - General (Internal Medicine)  DIAGNOSIS:  Encounter Diagnosis  Name Primary?  . Metastatic breast cancer ([place])     SUMMARY OF ONCOLOGIC HISTORY:   Metastatic breast cancer ([place])   06/05/2006 Initial Diagnosis    Left breast biopsy: DCIS with apocrine features ER 30%, PR 13%    06/19/2006 Surgery    Left lumpectomy: stage I, pT1a, pN0(i)(sn), pMX, 0.3 cm IDC, grade 1, with extensive high-grade DCIS , margins negative ER 38 %, PR 93 %, Ki-67 10%, HER-2/neu 2+, no additional tissue available for FISH testing, with 0/1 left axillary lymph nodes.    07/30/2006 - 09/17/2006 Radiation Therapy    Adjuvant radiation therapy    09/24/2006 - 09/25/2011 Anti-estrogen oral therapy    Antiestrogen therapy with Arimidex 5 years    04/26/2016 Relapse/Recurrence    Innumerable hepatic lesions seen in both lobes most are 1-2 cm size somewhat confluent largest in the posterior right hepatic dome 3 x 4.6 cm    05/07/2016 PET scan    PET/CT scan: Extensive metastatic involvement of both lobes of the liver, primary could be liver, phalangeal or metastatic disease, hypermetabolic thoracic LN left axillary, left supraclavicular and right CP angle LN, hypermetabolic upper abdominal LN    05/16/2016 Initial Biopsy    Liver biopsy: Metastatic carcinoma breast primary strong positivity for CK 7 weak focal positivity for ER, GCDFP, negative for PR, CDX 2, TTF-1, WT 1; HER-2 positive    06/13/2016 - 11/07/2016 Chemotherapy    Taxotere Herceptin and Perjeta every 3 weeks (Taxotere was held for elevated LFTs for cycles 1 and 2)     11/28/2016 - 05/06/2018 Chemotherapy    Herceptin maintenance therapy for metastatic breast cancer every 3 weeks     03/24/2018 Surgery    Chronic cholecystitis: Diagnostic laparoscopy: Due to intermittent attacks of biliary colic: The gallbladder was rockhard and not distensible with tumor palpated in the porta hepatis, extensive lucent liver  lesions, decision was made to stop the surgery.    05/12/2018 - 05/22/2018 Radiation Therapy    Radiation to the brain    05/29/2018 -  Chemotherapy    Kadcyla      CHIEF COMPLIANT: Follow-up on Kadcyla  INTERVAL HISTORY: Monique Figueroa is a 84 year old with above-mentioned history of metastatic breast cancer with brain metastasis currently on Kadcyla.  She is tolerating Taxol extremely well.  She did have occasional nausea for which she took Compazine however Compazine makes her sleepy.  She does have mild fatigue but able to enjoy all activities .  She has no pain or discomfort.  REVIEW OF SYSTEMS:   Constitutional: Denies fevers, chills or abnormal weight loss Eyes: Denies blurriness of vision Ears, nose, mouth, throat, and face: Denies mucositis or sore throat Respiratory: Denies cough, dyspnea or wheezes Cardiovascular: Denies palpitation, chest discomfort Gastrointestinal: Occasional nausea Skin: Denies abnormal skin rashes Lymphatics: Denies new lymphadenopathy or easy bruising Neurological:Denies numbness, tingling or new weaknesses Behavioral/Psych: Mood is stable, no new changes  Extremities: No lower extremity edema Breast:  denies any pain or lumps or nodules in either breasts All other systems were reviewed with the patient and are negative.  I have reviewed the past medical history, past surgical history, social history and family history with the patient and they are unchanged from previous note.  ALLERGIES:  is allergic to perjeta [pertuzumab] and sulfa antibiotics.  MEDICATIONS:  Current Outpatient Medications  Medication Sig Dispense Refill  . benzocaine (  ORAJEL) 10 % mucosal gel Use as directed 1 application in the mouth or throat as needed for mouth pain.    . calcium carbonate (TUMS - DOSED IN MG ELEMENTAL CALCIUM) 500 MG chewable tablet Chew 1 tablet by mouth daily as needed for indigestion or heartburn.    . Cholecalciferol (VITAMIN D) 2000 units tablet  Take 2,000 Units by mouth daily.    . diphenhydrAMINE (BENADRYL) 25 MG tablet Take 25 mg by mouth daily as needed for allergies.    Marland Kitchen levothyroxine (SYNTHROID, LEVOTHROID) 125 MCG tablet Take 125 mcg by mouth daily before breakfast.     . metFORMIN (GLUCOPHAGE) 500 MG tablet Take 500 mg by mouth daily with breakfast.     . omeprazole (PRILOSEC OTC) 20 MG tablet Take 20 mg by mouth daily as needed (acid reflux).     . ondansetron (ZOFRAN) 4 MG tablet Take 1 tablet (4 mg total) by mouth daily as needed for nausea or vomiting. (Patient not taking: Reported on 06/24/2018) 30 tablet 3  . ondansetron (ZOFRAN-ODT) 8 MG disintegrating tablet Take 1 tablet (8 mg total) by mouth every 8 (eight) hours as needed for nausea or vomiting. (Patient not taking: Reported on 06/24/2018) 30 tablet 3  . oxyCODONE-acetaminophen (PERCOCET) 5-325 MG tablet Take 1 tablet by mouth every 4 (four) hours as needed for moderate pain. (Patient not taking: Reported on 06/24/2018) 30 tablet 0  . prochlorperazine (COMPAZINE) 10 MG tablet TAKE 1 TABLET (10 MG TOTAL) BY MOUTH EVERY 6 (SIX) HOURS AS NEEDED (NAUSEA OR VOMITING). (Patient not taking: Reported on 06/24/2018) 30 tablet 1  . prochlorperazine (COMPAZINE) 25 MG suppository Place 1 suppository (25 mg total) rectally every 12 (twelve) hours as needed for nausea or vomiting. (Patient not taking: Reported on 06/24/2018) 12 suppository 0  . Propylene Glycol (SYSTANE COMPLETE) 0.6 % SOLN Place 1 drop into both eyes 2 (two) times daily.    . sodium chloride (OCEAN) 0.65 % SOLN nasal spray Place 1 spray into both nostrils as needed for congestion.     No current facility-administered medications for this visit.     PHYSICAL EXAMINATION: ECOG PERFORMANCE STATUS: 1 - Symptomatic but completely ambulatory  Vitals:   07/30/18 0841  BP: (!) 145/75  Pulse: 88  Resp: 18  Temp: 97.9 F (36.6 C)  SpO2: 98%   Filed Weights   07/30/18 0841  Weight: 147 lb (66.7 kg)     GENERAL:alert, no distress and comfortable SKIN: skin color, texture, turgor are normal, no rashes or significant lesions EYES: normal, Conjunctiva are pink and non-injected, sclera clear OROPHARYNX:no exudate, no erythema and lips, buccal mucosa, and tongue normal  NECK: supple, thyroid normal size, non-tender, without nodularity LYMPH:  no palpable lymphadenopathy in the cervical, axillary or inguinal LUNGS: clear to auscultation and percussion with normal breathing effort HEART: regular rate & rhythm and no murmurs and no lower extremity edema ABDOMEN:abdomen soft, non-tender and normal bowel sounds MUSCULOSKELETAL:no cyanosis of digits and no clubbing  NEURO: alert & oriented x 3 with fluent speech, no focal motor/sensory deficits EXTREMITIES: No lower extremity edema   LABORATORY DATA:  I have reviewed the data as listed CMP Latest Ref Rng & Units 07/09/2018 06/18/2018 05/29/2018  Glucose 70 - 99 mg/dL 94 166(H) 124(H)  BUN 8 - 23 mg/dL '9 11 22  '$ Creatinine 0.44 - 1.00 mg/dL 0.68 0.71 0.71  Sodium 135 - 145 mmol/L 140 140 138  Potassium 3.5 - 5.1 mmol/L 3.5 3.0(LL) 3.9  Chloride 98 -  111 mmol/L 106 105 102  CO2 22 - 32 mmol/L '23 25 26  '$ Calcium 8.9 - 10.3 mg/dL 8.8(L) 7.7(L) 8.4(L)  Total Protein 6.5 - 8.1 g/dL 6.6 6.1(L) 6.0(L)  Total Bilirubin 0.3 - 1.2 mg/dL 0.7 0.6 1.3(H)  Alkaline Phos 38 - 126 U/L 61 80 111  AST 15 - 41 U/L '26 19 22  '$ ALT 0 - 44 U/L '9 10 24    '$ Lab Results  Component Value Date   WBC 4.4 07/30/2018   HGB 12.1 07/30/2018   HCT 37.1 07/30/2018   MCV 97.9 07/30/2018   PLT 159 07/30/2018   NEUTROABS 3.2 07/30/2018    ASSESSMENT & PLAN:  Metastatic breast cancer ([place]) Metastatic breast cancer with innumerable liver metastases detected on ultrasound and recent MRI of the liver on 04/26/2016 (Prior history of left breast IDC T1 1 N0 stage IA 0.3 cm grade 1 tumor that was ER 38%, PR 93%, Ki-67 10%, HER-2 2+, no tissue for FISH testing, 0/1 lymph node  negative, status post lumpectomy radiation and 5 years of Arimidex completed in February 2013)  PET/CT scan: 05/07/2016: Extensive metastatic involvement of both lobes of the liver, primary could be liver, phalangeal or metastatic disease, hypermetabolic thoracic lymph nodes left axillary, left supraclavicular and right CP angle lymph nodes, hypermetabolic upper abdominal lymph nodes  Liver biopsy10/07/2016: Metastatic carcinoma breast primary strong positivity for CK 7 weak focal positivity for ER, GCDFP, negative for PR, CDX 2, TTF-1, WT 1; HER-2 positive  Treatment plan: 1. Taxotere Herceptin and Perjeta every 3 weeks palliative chemotherapy started 06/13/16 (Perjeta discontinued after 5 cycles due to allergy) 2. followed by Herceptin maintenance stopped 05/08/2018 due to progression ----------------------------------------------------------------------------------------------------------------------------------------- Current treatment: Kadcyla starting 05/29/2018 today is cycle 3 Patient completed palliative radiation therapy to the brain Echo on 04/21/18 shows normal EF of 55-60%  Kadcyla toxicities: No side effects to Kadcyla.  Brain metastases: Status post radiation Decrease weight and appetite Plan to do scans end of January 2020 Patient be following up with radiation oncology 09/02/2017 to review brain MRI. Return to clinic every 3 weeks for labs and follow-up and Kadcyla     No orders of the defined types were placed in this encounter.  The patient has a good understanding of the overall plan. she agrees with it. she will call with any problems that may develop before the next visit here.  Nicholas Lose, MD 07/09/2018    No orders of the defined types were placed in this encounter.  The patient has a good understanding of the overall plan. she agrees with it. she will call with any problems that may develop before the next visit here.   Harriette Ohara,  MD 07/30/18

## 2018-07-30 NOTE — Patient Instructions (Signed)
[facility] Discharge Instructions for Patients Receiving Chemotherapy  Today you received the following chemotherapy agents :  Kadcyla.  To help prevent nausea and vomiting after your treatment, we encourage you to take your nausea medication as prescribed.   If you develop nausea and vomiting that is not controlled by your nausea medication, call the clinic.   BELOW ARE SYMPTOMS THAT SHOULD BE REPORTED IMMEDIATELY:  *FEVER GREATER THAN 100.5 F  *CHILLS WITH OR WITHOUT FEVER  NAUSEA AND VOMITING THAT IS NOT CONTROLLED WITH YOUR NAUSEA MEDICATION  *UNUSUAL SHORTNESS OF BREATH  *UNUSUAL BRUISING OR BLEEDING  TENDERNESS IN MOUTH AND THROAT WITH OR WITHOUT PRESENCE OF ULCERS  *URINARY PROBLEMS  *BOWEL PROBLEMS  UNUSUAL RASH Items with * indicate a potential emergency and should be followed up as soon as possible.  Feel free to call the clinic should you have any questions or concerns. The clinic phone number is [phone].  Please show the CHEMO ALERT CARD at check-in to the Emergency Department and triage nurse.   

## 2018-08-03 ENCOUNTER — Encounter (HOSPITAL_COMMUNITY): Payer: Self-pay | Admitting: Emergency Medicine

## 2018-08-03 ENCOUNTER — Telehealth: Payer: Self-pay | Admitting: Hematology and Oncology

## 2018-08-03 ENCOUNTER — Emergency Department (HOSPITAL_COMMUNITY): Payer: Medicare Other

## 2018-08-03 ENCOUNTER — Emergency Department (HOSPITAL_COMMUNITY)
Admission: EM | Admit: 2018-08-03 | Discharge: 2018-08-03 | Disposition: A | Discharge status: Home / Self Care | Payer: Medicare Other | Attending: Emergency Medicine | Admitting: Emergency Medicine

## 2018-08-03 ENCOUNTER — Other Ambulatory Visit: Payer: Self-pay

## 2018-08-03 DIAGNOSIS — E039 Hypothyroidism, unspecified: Secondary | ICD-10-CM | POA: Diagnosis not present

## 2018-08-03 DIAGNOSIS — G893 Neoplasm related pain (acute) (chronic): Secondary | ICD-10-CM | POA: Insufficient documentation

## 2018-08-03 DIAGNOSIS — Z7982 Long term (current) use of aspirin: Secondary | ICD-10-CM | POA: Diagnosis not present

## 2018-08-03 DIAGNOSIS — R109 Unspecified abdominal pain: Secondary | ICD-10-CM | POA: Diagnosis not present

## 2018-08-03 DIAGNOSIS — E119 Type 2 diabetes mellitus without complications: Secondary | ICD-10-CM | POA: Diagnosis not present

## 2018-08-03 DIAGNOSIS — I10 Essential (primary) hypertension: Secondary | ICD-10-CM | POA: Insufficient documentation

## 2018-08-03 DIAGNOSIS — R1011 Right upper quadrant pain: Secondary | ICD-10-CM | POA: Diagnosis present

## 2018-08-03 DIAGNOSIS — Z7984 Long term (current) use of oral hypoglycemic drugs: Secondary | ICD-10-CM | POA: Insufficient documentation

## 2018-08-03 DIAGNOSIS — C787 Secondary malignant neoplasm of liver and intrahepatic bile duct: Secondary | ICD-10-CM | POA: Insufficient documentation

## 2018-08-03 DIAGNOSIS — Z853 Personal history of malignant neoplasm of breast: Secondary | ICD-10-CM | POA: Insufficient documentation

## 2018-08-03 LAB — CBC WITH DIFFERENTIAL/PLATELET
Abs Immature Granulocytes: 0.02 10*3/uL (ref 0.00–0.07)
Basophils Absolute: 0 10*3/uL (ref 0.0–0.1)
Basophils Relative: 1 %
Eosinophils Absolute: 0.1 10*3/uL (ref 0.0–0.5)
Eosinophils Relative: 1 %
HCT: 37.5 % (ref 36.0–46.0)
Hemoglobin: 12.4 g/dL (ref 12.0–15.0)
Immature Granulocytes: 0 %
Lymphocytes Relative: 9 %
Lymphs Abs: 0.5 10*3/uL — ABNORMAL LOW (ref 0.7–4.0)
MCH: 32.3 pg (ref 26.0–34.0)
MCHC: 33.1 g/dL (ref 30.0–36.0)
MCV: 97.7 fL (ref 80.0–100.0)
Monocytes Absolute: 0.7 10*3/uL (ref 0.1–1.0)
Monocytes Relative: 11 %
NRBC: 0 % (ref 0.0–0.2)
Neutro Abs: 4.9 10*3/uL (ref 1.7–7.7)
Neutrophils Relative %: 78 %
PLATELETS: 132 10*3/uL — AB (ref 150–400)
RBC: 3.84 MIL/uL — ABNORMAL LOW (ref 3.87–5.11)
RDW: 13.7 % (ref 11.5–15.5)
WBC: 6.2 10*3/uL (ref 4.0–10.5)

## 2018-08-03 LAB — URINALYSIS, ROUTINE W REFLEX MICROSCOPIC
Bilirubin Urine: NEGATIVE
Glucose, UA: NEGATIVE mg/dL
Hgb urine dipstick: NEGATIVE
Ketones, ur: 5 mg/dL — AB
Leukocytes, UA: NEGATIVE
Nitrite: NEGATIVE
Protein, ur: NEGATIVE mg/dL
SPECIFIC GRAVITY, URINE: 1.017 (ref 1.005–1.030)
pH: 7 (ref 5.0–8.0)

## 2018-08-03 LAB — COMPREHENSIVE METABOLIC PANEL
ALT: 18 U/L (ref 0–44)
ANION GAP: 13 (ref 5–15)
AST: 46 U/L — ABNORMAL HIGH (ref 15–41)
Albumin: 3.7 g/dL (ref 3.5–5.0)
Alkaline Phosphatase: 52 U/L (ref 38–126)
BUN: 8 mg/dL (ref 8–23)
CHLORIDE: 103 mmol/L (ref 98–111)
CO2: 21 mmol/L — ABNORMAL LOW (ref 22–32)
Calcium: 8.8 mg/dL — ABNORMAL LOW (ref 8.9–10.3)
Creatinine, Ser: 0.56 mg/dL (ref 0.44–1.00)
GFR calc Af Amer: >60 mL/min (ref >60)
GFR calc non Af Amer: >60 mL/min (ref >60)
Glucose, Bld: 118 mg/dL — ABNORMAL HIGH (ref 70–99)
Potassium: 3 mmol/L — ABNORMAL LOW (ref 3.5–5.1)
Sodium: 137 mmol/L (ref 135–145)
Total Bilirubin: 0.7 mg/dL (ref 0.3–1.2)
Total Protein: 6.8 g/dL (ref 6.5–8.1)

## 2018-08-03 LAB — LIPASE, BLOOD: LIPASE: 40 U/L (ref 11–51)

## 2018-08-03 MED ORDER — FENTANYL CITRATE (PF) 100 MCG/2ML IJ SOLN
50.0000 ug | Freq: Once | INTRAMUSCULAR | Status: AC
  Administered 2018-08-03: 50 ug via INTRAVENOUS
  Filled 2018-08-03: qty 2

## 2018-08-03 MED ORDER — IOPAMIDOL (ISOVUE-300) INJECTION 61%
100.0000 mL | Freq: Once | INTRAVENOUS | Status: AC | PRN
  Administered 2018-08-03: 100 mL via INTRAVENOUS

## 2018-08-03 MED ORDER — ONDANSETRON HCL 4 MG/2ML IJ SOLN
4.0000 mg | Freq: Once | INTRAMUSCULAR | Status: AC
  Administered 2018-08-03: 4 mg via INTRAVENOUS
  Filled 2018-08-03: qty 2

## 2018-08-03 MED ORDER — HEPARIN SOD (PORK) LOCK FLUSH 100 UNIT/ML IV SOLN
500.0000 [IU] | Freq: Once | INTRAVENOUS | Status: AC
  Administered 2018-08-03: 500 [IU]
  Filled 2018-08-03: qty 5

## 2018-08-03 MED ORDER — ONDANSETRON 4 MG PO TBDP: 4.0000 mg | ORAL_TABLET | Freq: Once | ORAL | Status: DC | PRN

## 2018-08-03 NOTE — ED Provider Notes (Signed)
[facility] DEPT Provider Note: Georgena Spurling, MD, FACEP  CSN: [id number] MRN: [id number] ARRIVAL: 08/03/18 at 0400 ROOM: WA09/WA09   CHIEF COMPLAINT  Flank Pain   HISTORY OF PRESENT ILLNESS  08/03/18 5:25 AM Monique Figueroa is a 84 y.o. female with a history of breast cancer metastatic to the liver, biliary tract and brain.  She is currently undergoing chemotherapy.  She is here with right flank pain that woke her up about 1 AM this morning.  The pain has since begun radiating to her right abdomen.  She rates her pain as an 8 out of 10.  Pain is somewhat worse with movement or palpation of the abdomen.  She has had associated nausea and dry heaves.  She has had abdominal cramping but no diarrhea.  She has not taken anything for the pain.   Past Medical History:  Diagnosis Date  . Anemia   . Appendicitis   . Arthritis   . Biliary dyskinesia 03/24/2018  . Breast cancer ([place]) 06/2006   Invasive ductal and DCIS of left breast  . Breast tumor    History of bilateral breast tumors/cysts  . Cholecystitis   . Cyst of spinal meninges   . Diabetes mellitus without complication ([place])   . GERD (gastroesophageal reflux disease)   . Hypertension   . Hypothyroidism   . Metastatic breast cancer ([place]) 04/26/2016   mets to liver  . PONV (postoperative nausea and vomiting)   . Thyroid cancer ([place])   . Tuberculosis    medullary carcinoma ; denies TB   . UTI (urinary tract infection)    hx    Past Surgical History:  Procedure Laterality Date  . APPENDECTOMY    . BALLOON DILATION N/A 04/20/2018   Procedure: BALLOON DILATION;  Surgeon: Rush Landmark Telford Nab., MD;  Location: [facility];  Service: Gastroenterology;  Laterality: N/A;  . BILIARY STENT PLACEMENT  04/20/2018   Procedure: BILIARY STENT PLACEMENT;  Surgeon: Rush Landmark Telford Nab., MD;  Location: [facility];  Service: Gastroenterology;;  . BREAST CYST EXCISION Bilateral    Several asprirations and excisions  . BREAST  LUMPECTOMY WITH AXILLARY LYMPH NODE BIOPSY Left 06/19/2006   Invasive ductal and in situ carcinoma, node negative  . CATARACT EXTRACTION, BILATERAL  2009  . CHOLECYSTECTOMY N/A 03/24/2018   Procedure: DIAGNOSTIC LAPAROSCOPY;  Surgeon: Fanny Skates, MD;  Location: [facility];  Service: General;  Laterality: N/A;  . ENDOSCOPIC RETROGRADE CHOLANGIOPANCREATOGRAPHY (ERCP) WITH PROPOFOL N/A 04/20/2018   Procedure: ENDOSCOPIC RETROGRADE CHOLANGIOPANCREATOGRAPHY (ERCP) WITH PROPOFOL ;  Surgeon: Irving Copas., MD;  Location: [facility];  Service: Gastroenterology;  Laterality: N/A;  . PORTACATH PLACEMENT N/A 06/05/2016   Procedure: INSERTION PORT-A-CATH WITH [place];  Surgeon: Fanny Skates, MD;  Location: [facility];  Service: General;  Laterality: N/A;  . SPHINCTEROTOMY  04/20/2018   Procedure: SPHINCTEROTOMY;  Surgeon: Mansouraty, Telford Nab., MD;  Location: [facility];  Service: Gastroenterology;;  . [place]    . TOTAL THYROIDECTOMY      Family History  Problem Relation Age of Onset  . Rheum arthritis Mother   . Coronary artery disease Mother   . Dementia Mother   . Diabetes Mother   . Heart Problems Father   . Cancer Sister        Breast cancer  . Cancer Brother        Lung cancer  . Diabetes Sister   . Dementia Sister     Social History   Tobacco Use  .  Smoking status: Never Smoker  . Smokeless tobacco: Never Used  Substance Use Topics  . Alcohol use: No  . Drug use: No    Prior to Admission medications   Medication Sig Start Date End Date Taking? Authorizing Provider  benzocaine (ORAJEL) 10 % mucosal gel Use as directed 1 application in the mouth or throat as needed for mouth pain.    [provider]  calcium carbonate (TUMS - DOSED IN MG ELEMENTAL CALCIUM) 500 MG chewable tablet Chew 1 tablet by mouth daily as needed for indigestion or heartburn.    [provider]  Cholecalciferol (VITAMIN D) 2000 units tablet Take 2,000 Units by  mouth daily.    [provider]  diphenhydrAMINE (BENADRYL) 25 MG tablet Take 25 mg by mouth daily as needed for allergies.    [provider]  levothyroxine (SYNTHROID, LEVOTHROID) 125 MCG tablet Take 125 mcg by mouth daily before breakfast.  03/26/13   [provider]  metFORMIN (GLUCOPHAGE) 500 MG tablet Take 500 mg by mouth daily with breakfast.  06/04/13   [provider]  omeprazole (PRILOSEC OTC) 20 MG tablet Take 20 mg by mouth daily as needed (acid reflux).     [provider]  ondansetron (ZOFRAN) 4 MG tablet Take 1 tablet (4 mg total) by mouth daily as needed for nausea or vomiting. Patient not taking: Reported on 06/24/2018 04/16/18 04/16/19  Nicholas Lose, MD  ondansetron (ZOFRAN-ODT) 8 MG disintegrating tablet Take 1 tablet (8 mg total) by mouth every 8 (eight) hours as needed for nausea or vomiting. Patient not taking: Reported on 06/24/2018 04/16/18   Nicholas Lose, MD  oxyCODONE-acetaminophen (PERCOCET) 5-325 MG tablet Take 1 tablet by mouth every 4 (four) hours as needed for moderate pain. Patient not taking: Reported on 06/24/2018 02/12/18   Nicholas Lose, MD  potassium chloride SA (K-DUR,KLOR-CON) 20 MEQ tablet Take 1 tablet (20 mEq total) by mouth daily. 07/30/18   Nicholas Lose, MD  prochlorperazine (COMPAZINE) 10 MG tablet TAKE 1 TABLET (10 MG TOTAL) BY MOUTH EVERY 6 (SIX) HOURS AS NEEDED (NAUSEA OR VOMITING). Patient not taking: Reported on 06/24/2018 05/04/18   Nicholas Lose, MD  prochlorperazine (COMPAZINE) 25 MG suppository Place 1 suppository (25 mg total) rectally every 12 (twelve) hours as needed for nausea or vomiting. Patient not taking: Reported on 06/24/2018 04/13/18   Nicholas Lose, MD  Propylene Glycol (SYSTANE COMPLETE) 0.6 % SOLN Place 1 drop into both eyes 2 (two) times daily.    [provider]  sodium chloride (OCEAN) 0.65 % SOLN nasal spray Place 1 spray into both nostrils as needed for congestion.    [provider]    Allergies Perjeta [pertuzumab] and Sulfa antibiotics   REVIEW OF SYSTEMS  Negative except as noted here or in the History of Present Illness.   PHYSICAL EXAMINATION  Initial Vital Signs Blood pressure (!) 164/97, pulse (!) 101, temperature 98.4 F (36.9 C), temperature source Oral, resp. rate 18, height 5\' 5"  (1.651 m), weight 66.7 kg.  Examination General: Well-developed, well-nourished female in no acute distress; appearance consistent with age of record HENT: normocephalic; atraumatic Eyes: pupils equal, round and reactive to light; extraocular muscles intact; bilateral pseudophakia Neck: supple Heart: regular rate and rhythm Lungs: clear to auscultation bilaterally Chest: Port-A-Cath right upper chest Abdomen: soft; nondistended; right-sided tenderness; bowel sounds present GU: No CVA tenderness Extremities: Arthritic changes; no acute deformity; pulses normal; trace edema of lower legs Neurologic: Awake, alert and oriented; motor function intact in  all extremities and symmetric; no facial droop Skin: Warm and dry Psychiatric: Normal mood and affect   RESULTS  Summary of this visit's results, reviewed by myself:   EKG Interpretation  Date/Time:    Ventricular Rate:    PR Interval:    QRS Duration:   QT Interval:    QTC Calculation:   R Axis:     Text Interpretation:        Laboratory Studies: Results for orders placed or performed during the hospital encounter of 08/03/18 (from the past 24 hour(s))  Lipase, blood     Status: None   Collection Time: 08/03/18  5:51 AM  Result Value Ref Range   Lipase 40 11 - 51 U/L  Comprehensive metabolic panel     Status: Abnormal   Collection Time: 08/03/18  5:51 AM  Result Value Ref Range   Sodium 137 135 - 145 mmol/L   Potassium 3.0 (L) 3.5 - 5.1 mmol/L   Chloride 103 98 - 111 mmol/L   CO2 21 (L) 22 - 32 mmol/L   Glucose, Bld 118 (H) 70 - 99 mg/dL   BUN 8 8 - 23 mg/dL   Creatinine, Ser 0.56 0.44  - 1.00 mg/dL   Calcium 8.8 (L) 8.9 - 10.3 mg/dL   Total Protein 6.8 6.5 - 8.1 g/dL   Albumin 3.7 3.5 - 5.0 g/dL   AST 46 (H) 15 - 41 U/L   ALT 18 0 - 44 U/L   Alkaline Phosphatase 52 38 - 126 U/L   Total Bilirubin 0.7 0.3 - 1.2 mg/dL   GFR calc non Af Amer >60 >60 mL/min   GFR calc Af Amer >60 >60 mL/min   Anion gap 13 5 - 15  CBC with Differential/Platelet     Status: Abnormal   Collection Time: 08/03/18  5:51 AM  Result Value Ref Range   WBC 6.2 4.0 - 10.5 K/uL   RBC 3.84 (L) 3.87 - 5.11 MIL/uL   Hemoglobin 12.4 12.0 - 15.0 g/dL   HCT 37.5 36.0 - 46.0 %   MCV 97.7 80.0 - 100.0 fL   MCH 32.3 26.0 - 34.0 pg   MCHC 33.1 30.0 - 36.0 g/dL   RDW 13.7 11.5 - 15.5 %   Platelets 132 (L) 150 - 400 K/uL   nRBC 0.0 0.0 - 0.2 %   Neutrophils Relative % 78 %   Neutro Abs 4.9 1.7 - 7.7 K/uL   Lymphocytes Relative 9 %   Lymphs Abs 0.5 (L) 0.7 - 4.0 K/uL   Monocytes Relative 11 %   Monocytes Absolute 0.7 0.1 - 1.0 K/uL   Eosinophils Relative 1 %   Eosinophils Absolute 0.1 0.0 - 0.5 K/uL   Basophils Relative 1 %   Basophils Absolute 0.0 0.0 - 0.1 K/uL   Immature Granulocytes 0 %   Abs Immature Granulocytes 0.02 0.00 - 0.07 K/uL  Urinalysis, Routine w reflex microscopic     Status: Abnormal   Collection Time: 08/03/18  7:36 AM  Result Value Ref Range   Color, Urine STRAW (A) YELLOW   APPearance CLEAR CLEAR   Specific Gravity, Urine 1.017 1.005 - 1.030   pH 7.0 5.0 - 8.0   Glucose, UA NEGATIVE NEGATIVE mg/dL   Hgb urine dipstick NEGATIVE NEGATIVE   Bilirubin Urine NEGATIVE NEGATIVE   Ketones, ur 5 (A) NEGATIVE mg/dL   Protein, ur NEGATIVE NEGATIVE mg/dL   Nitrite NEGATIVE NEGATIVE   Leukocytes, UA NEGATIVE NEGATIVE   Imaging Studies: Ct Abdomen  Pelvis W Contrast  Result Date: 08/03/2018 CLINICAL DATA:  Abdominal pain. History of metastatic breast cancer. EXAM: CT ABDOMEN AND PELVIS WITH CONTRAST TECHNIQUE: Multidetector CT imaging of the abdomen and pelvis was performed using the  standard protocol following bolus administration of intravenous contrast. CONTRAST:  164mL ISOVUE-300 IOPAMIDOL (ISOVUE-300) INJECTION 61% COMPARISON:  CT scan of April 10, 2018. FINDINGS: Lower chest: No acute abnormality. Hepatobiliary: No gallstones are noted. Interval placement of biliary stent is noted which extends into duodenum, with no significant biliary dilatation is seen at this time. 7.0 x 5.2 cm low density is noted in right hepatic lobe which is slightly enlarged compared to prior exam, consistent with metastatic disease. Stable chronic occlusion of right portal vein is noted. Multiple other smaller low densities are noted throughout hepatic parenchyma concerning for metastatic disease. Pancreas: Unremarkable. No pancreatic ductal dilatation or surrounding inflammatory changes. Spleen: Normal in size without focal abnormality. Adrenals/Urinary Tract: Adrenal glands appear normal. Right renal cyst is noted. No hydronephrosis or renal obstruction is noted. No renal or ureteral calculi are noted. Urinary bladder is unremarkable. Stomach/Bowel: The stomach appears normal. There is no evidence of bowel obstruction or inflammation. Status post appendectomy. Sigmoid diverticulosis is noted without inflammation. Vascular/Lymphatic: Aortic atherosclerosis. No enlarged abdominal or pelvic lymph nodes. Reproductive: Calcified uterine fibroid is noted. No significant adnexal abnormality is noted. Other: No abdominal wall hernia or abnormality. No abdominopelvic ascites. Musculoskeletal: No acute or significant osseous findings. IMPRESSION: 7.0 x 5.2 cm low density is noted in right hepatic lobe which is slightly enlarged compared to prior exam consistent with metastatic disease. Multiple other smaller low densities are noted throughout hepatic parenchyma concerning for metastatic disease. Stable chronic occlusion of right portal vein. Interval placement of biliary stent which extends into duodenum. No  significant biliary dilatation is seen at this time. Sigmoid diverticulosis without inflammation. Calcified uterine fibroid. Aortic Atherosclerosis (ICD10-I70.0). Electronically Signed   By: Marijo Conception, M.D.   On: 08/03/2018 07:35    ED COURSE and MDM  Nursing notes and initial vitals signs, including pulse oximetry, reviewed.  Vitals:   08/03/18 0440 08/03/18 0441 08/03/18 0613  BP: (!) 164/97  (!) 150/82  Pulse: (!) 101  88  Resp: 18  17  Temp: 98.4 F (36.9 C)    TempSrc: Oral    SpO2:   97%  Weight:  66.7 kg   Height:  5\' 5"  (1.651 m)    7:52 AM Patient and son advised of lab and CT findings.  Her pain may be due to infarction of a metastasis.  She has an appointment with her gastroenterologist tomorrow for reevaluation of her biliary stent.  She does have pain medication at home which she has not been taking.  She was encouraged to take this as needed.  PROCEDURES    ED DIAGNOSES     ICD-10-CM   1. Cancer associated pain G89.3        Jaime Dome, [name], MD 08/03/18 [phone]

## 2018-08-03 NOTE — Telephone Encounter (Signed)
Per 12/26 no los 

## 2018-08-03 NOTE — ED Notes (Signed)
Patient transported to CT 

## 2018-08-03 NOTE — ED Triage Notes (Signed)
Patient is complaining having right flank pain and lower right back. Patient is nauseated. Patient states it started around 1 am. Patient woke up with the pain.

## 2018-08-11 ENCOUNTER — Ambulatory Visit: Payer: Medicare Other | Admitting: Gastroenterology

## 2018-08-11 ENCOUNTER — Encounter: Payer: Self-pay | Admitting: Gastroenterology

## 2018-08-11 VITALS — BP 130/72 | HR 100 | Ht 65.0 in | Wt 144.6 lb

## 2018-08-11 DIAGNOSIS — K831 Obstruction of bile duct: Secondary | ICD-10-CM

## 2018-08-11 DIAGNOSIS — R1011 Right upper quadrant pain: Secondary | ICD-10-CM | POA: Diagnosis not present

## 2018-08-11 DIAGNOSIS — Z9889 Other specified postprocedural states: Secondary | ICD-10-CM

## 2018-08-11 NOTE — Progress Notes (Signed)
[facility] VISIT   Primary Care Provider [name], [place] [address] [phone]  Referring Provider [name], MD [address] [place], [place] [address] [phone]  Patient Profile: CARLISA EBLE is a 85 y.o. female with a pmh significant for metastatic breast cancer (known brain and liver metastases), CBD stricturing (status post ERCP with stenting) status post appendectomy, arthritis, GERD, diabetes, hypertension, hypothyroidism.  The patient presents to the [facility] for an evaluation and management of problem(s) noted below:  Problem List 1. Common bile duct (CBD) stricture   2. RUQ pain     History of Present Illness: This is the patient's first visit to outpatient of our GI clinic.  He is a patient I met back in the fall of this last year in the setting of abnormal liver tests and imaging concerning for biliary duct dilation and possible stricturing disease from known metastatic breast cancer.  We performed an ERCP with findings of a biliary stricture that required balloon dilation as well as stenting.  Over the course of the ensuing weeks after stenting she had significant improvement and near normalization of her liver tests.  She has been doing well and plan for a 4 to 59-monthfollow-up ERCP for consideration of stent exchange.  The last couple weeks she has had the development of a severe right upper quadrant abdominal discomfort.  This was not associated with any fevers or jaundice or darkening urine.  She went to the ED for further evaluation and had repeat imaging that suggested the liver lesion had slightly increased in size and although a theory as to whether the patient could have infarction of 1 of her mass due to changes in chemotherapy was brought up it was not clearly delineated in her imaging that this was occurring.  She had no significant biliary ductal dilation  on her CT imaging.  Her labs were checked and her liver tests were not grossly abnormal.  She had no evidence of pancreatitis.  The pain subsided over the course the next 2 to 3 days.  Has not recurred since.  She does describe some issues of nausea at times.  She is not vomiting.  She has no pruritus.  No changes in her bowel habits otherwise.  No melena or hematochezia.  She is not taking significant nonsteroidals.  GI Review of Systems Positive as above Negative for dysphagia, odynophagia, jaundice, early satiety, abdominal bloating  Review of Systems General: Denies fevers/chills HEENT: Denies oral lesions Cardiovascular: Denies chest pain/palpitations Pulmonary: Denies shortness of breath Gastroenterological: See HPI Genitourinary: Denies darkened urine Hematological: Positive for easy bruising Dermatological: Denies jaundice Psychological: Mood is stable   Medications Current Outpatient Medications  Medication Sig Dispense Refill  . benzocaine (ORAJEL) 10 % mucosal gel Use as directed 1 application in the mouth or throat as needed for mouth pain.    . calcium carbonate (TUMS - DOSED IN MG ELEMENTAL CALCIUM) 500 MG chewable tablet Chew 1 tablet by mouth daily as needed for indigestion or heartburn.    . diphenhydrAMINE (BENADRYL) 25 MG tablet Take 25 mg by mouth daily as needed for allergies.    .Marland Kitchenlevothyroxine (SYNTHROID, LEVOTHROID) 125 MCG tablet Take 125 mcg by mouth daily before breakfast.     . metFORMIN (GLUCOPHAGE) 500 MG tablet Take 500 mg by mouth daily with breakfast.     . omeprazole (PRILOSEC OTC) 20 MG tablet Take 20 mg by mouth daily as  needed (acid reflux).     . potassium chloride SA (K-DUR,KLOR-CON) 20 MEQ tablet Take 1 tablet (20 mEq total) by mouth daily. 30 tablet 0  . Propylene Glycol (SYSTANE COMPLETE) 0.6 % SOLN Place 1 drop into both eyes 2 (two) times daily.    . sodium chloride (OCEAN) 0.65 % SOLN nasal spray Place 1 spray into both nostrils as needed for  congestion.     No current facility-administered medications for this visit.     Allergies Allergies  Allergen Reactions  . Perjeta [Pertuzumab] Shortness Of Breath and Palpitations    Turned red   . Sulfa Antibiotics Swelling    Histories Past Medical History:  Diagnosis Date  . Anemia   . Appendicitis   . Arthritis   . Biliary dyskinesia 03/24/2018  . Breast cancer ([place]) 06/2006   Invasive ductal and DCIS of left breast  . Breast tumor    History of bilateral breast tumors/cysts  . Cholecystitis   . Cyst of spinal meninges   . Diabetes mellitus without complication ([place])   . GERD (gastroesophageal reflux disease)   . Hypertension   . Hypothyroidism   . Metastatic breast cancer ([place]) 04/26/2016   mets to liver  . PONV (postoperative nausea and vomiting)   . Thyroid cancer ([place])   . Tuberculosis    medullary carcinoma ; denies TB   . UTI (urinary tract infection)    hx   Past Surgical History:  Procedure Laterality Date  . APPENDECTOMY    . BALLOON DILATION N/A 04/20/2018   Procedure: BALLOON DILATION;  Surgeon: [facility] Telford Nab., MD;  Location: [facility];  Service: Gastroenterology;  Laterality: N/A;  . BILIARY STENT PLACEMENT  04/20/2018   Procedure: BILIARY STENT PLACEMENT;  Surgeon: [facility] Telford Nab., MD;  Location: [facility];  Service: Gastroenterology;;  . BREAST CYST EXCISION Bilateral    Several asprirations and excisions  . BREAST LUMPECTOMY WITH AXILLARY LYMPH NODE BIOPSY Left 06/19/2006   Invasive ductal and in situ carcinoma, node negative  . CATARACT EXTRACTION, BILATERAL  2009  . CHOLECYSTECTOMY N/A 03/24/2018   Procedure: DIAGNOSTIC LAPAROSCOPY;  Surgeon: Fanny Skates, MD;  Location: [facility];  Service: General;  Laterality: N/A;  . ENDOSCOPIC RETROGRADE CHOLANGIOPANCREATOGRAPHY (ERCP) WITH PROPOFOL N/A 04/20/2018   Procedure: ENDOSCOPIC RETROGRADE CHOLANGIOPANCREATOGRAPHY (ERCP) WITH PROPOFOL ;  Surgeon: Irving Copas., MD;   Location: [facility];  Service: Gastroenterology;  Laterality: N/A;  . PORTACATH PLACEMENT N/A 06/05/2016   Procedure: INSERTION PORT-A-CATH WITH [place];  Surgeon: Fanny Skates, MD;  Location: [facility];  Service: General;  Laterality: N/A;  . SPHINCTEROTOMY  04/20/2018   Procedure: SPHINCTEROTOMY;  Surgeon: Mansouraty, Telford Nab., MD;  Location: [facility];  Service: Gastroenterology;;  . [place]    . TOTAL THYROIDECTOMY     Social History   Socioeconomic History  . Marital status: Widowed    Spouse name: Not on file  . Number of children: 1  . Years of education: Not on file  . Highest education level: Not on file  Occupational History  . Not on file  Social Needs  . Financial resource strain: Not on file  . Food insecurity:    Worry: Not on file    Inability: Not on file  . Transportation needs:    Medical: No    Non-medical: No  Tobacco Use  . Smoking status: Never Smoker  . Smokeless tobacco: Never Used  Substance and Sexual Activity  . Alcohol use: No  .  Drug use: No  . Sexual activity: Not Currently    Birth control/protection: Post-menopausal  Lifestyle  . Physical activity:    Days per week: Not on file    Minutes per session: Not on file  . Stress: Not on file  Relationships  . Social connections:    Talks on phone: Not on file    Gets together: Not on file    Attends religious service: Not on file    Active member of club or organization: Not on file    Attends meetings of clubs or organizations: Not on file    Relationship status: Not on file  . Intimate partner violence:    Fear of current or ex partner: No    Emotionally abused: No    Physically abused: No    Forced sexual activity: No  Other Topics Concern  . Not on file  Social History Narrative  . Not on file   Family History  Problem Relation Age of Onset  . Rheum arthritis Mother   . Coronary artery disease Mother   . Dementia Mother   . Diabetes Mother   . Heart  Problems Father   . Cancer Sister        Breast cancer  . Cancer Brother        Lung cancer  . Diabetes Sister   . Dementia Sister   . Colon cancer Neg Hx   . Esophageal cancer Neg Hx   . Inflammatory bowel disease Neg Hx   . Liver disease Neg Hx   . Pancreatic cancer Neg Hx   . Rectal cancer Neg Hx   . Stomach cancer Neg Hx    I have reviewed her medical, social, and family history in detail and updated the electronic medical record as necessary.    PHYSICAL EXAMINATION  BP 130/72   Pulse 100   Ht _0  (1.651 m)   Wt 144 lb 9.6 oz (65.6 kg)   BMI 24.06 kg/m  Wt Readings from Last 3 Encounters:  08/11/18 144 lb 9.6 oz (65.6 kg)  08/03/18 147 lb (66.7 kg)  07/30/18 147 lb (66.7 kg)   GEN: NAD, appears stated age, doesn't appear chronically ill, accompanied by son PSYCH: Cooperative, without pressured speech EYE: Conjunctivae pink, sclerae anicteric ENT: MMM, without oral ulcers NECK: Supple CV: RR without R/Gs  RESP: CTAB posteriorly, without wheezing GI: NABS, soft, NT/ND, without rebound or guarding, no HSM appreciated MSK/EXT: Trace bilateral lower extremity edema SKIN: No jaundice NEURO:  Alert & Oriented x 3, no focal deficits   REVIEW OF DATA  I reviewed the following data at the time of this encounter:  GI Procedures and Studies  September 2019 ERCP - The major papilla was adjacent to a diverticulum but otherwise was normal. - A single severe biliary stricture was found in the common hepatic duct and the left hepatic duct. The strictures were malignant appearing. Upstream biliary tree was dilated. These strictures were dilated. These strictures were traversed with a stent into the left hepatic duct system. - A single mild biliary stricture was found in the presumed right main hepatic duct (though based on fluoroscopy, this could have been a branch off of the left system was well). The stricture was malignant appearing. This stricture was dilated. The upstream  ducts were mildly dilated but not to extent of other strictures. - Due to size of CBD, concern to be able to successfully place 2 stents in region, we placed stent in the  higher priority strictures. - A biliary sphincterotomy was performed. - The biliary tree was swept and sludge was found initially.  Laboratory Studies  Reviewed in epic  Imaging Studies  December 2019 CT abdomen pelvis with contrast IMPRESSION: 7.0 x 5.2 cm low density is noted in right hepatic lobe which is slightly enlarged compared to prior exam consistent with metastatic disease. Multiple other smaller low densities are noted throughout hepatic parenchyma concerning for metastatic disease. Stable chronic occlusion of right portal vein. Interval placement of biliary stent which extends into duodenum. No significant biliary dilatation is seen at this time. Sigmoid diverticulosis without inflammation. Calcified uterine fibroid. Aortic Atherosclerosis (ICD10-I70.0).   ASSESSMENT  Ms. Galdamez is a 85 y.o. female with a pmh significant for metastatic breast cancer (known brain and liver metastases), CBD stricturing (status post ERCP with stenting) status post appendectomy, arthritis, GERD, diabetes, hypertension, hypothyroidism.  The patient is seen today for evaluation and management of:  1. Common bile duct (CBD) stricture   2. RUQ pain    The patient is hemodynamically and clinically stable at this point in time.  The etiology of the patient's right upper quadrant abdominal discomfort is not clearly delineated and I am not sure about the ED providers concern about infarction in her liver in the setting of her recent change in chemotherapy as the imaging is not supportive of that.  There is no overt evidence of pancreatitis and she has pneumobilia suggestive of her stent still functioning well and her liver tests have not bumped.  At this point in time I spoke with the patient and her family at length about the  consideration of stent exchange.  We may consider the role of biliary EPIC intraductal stents versus repeat plastic stent utilization.  I will relay this notation to the patient's oncologist such that we can make a decision in the coming weeks.  Ideally I would like her to be at least 1 week out from the administration of her chemotherapy to ensure that she does not have any nadir in her counts.  The risks of an ERCP were discussed at length, including but not limited to the risk of perforation, bleeding, abdominal pain, post-ERCP pancreatitis (while usually mild can be severe and even life threatening).  The risks and benefits of endoscopic evaluation were discussed with the patient; these include but are not limited to the risk of perforation, infection, bleeding, missed lesions, lack of diagnosis, severe illness requiring hospitalization, as well as anesthesia and sedation related illnesses.  The patient and family agreeable to proceed.    PLAN  Laboratories as outlined below should be drawn within 6 to 8 hours of acute episode of pain to evaluate if liver tests are abnormal or see if there could be any signs of biliary stent malfunction/dysfunction Plan for ERCP in February   Orders Placed This Encounter  Procedures  . CBC  . Amylase  . Lipase  . Hepatic function panel  . Ambulatory referral to Gastroenterology    New Prescriptions   No medications on file   Modified Medications   No medications on file    Planned Follow Up: No follow-ups on file.   Justice Britain, MD [facility] Office # [phone]

## 2018-08-11 NOTE — Patient Instructions (Signed)
You have been scheduled for [facility]. Please follow the written instructions given to you at your visit today.  Your provider has requested that you go to the basement level for lab work before leaving today. Press "B" on the elevator. The lab is located at the first door on the left as you exit the elevator.  Thank you for entrusting me with your care and choosing [facility] care.  Dr [facility]  .

## 2018-08-13 ENCOUNTER — Other Ambulatory Visit: Payer: Self-pay

## 2018-08-18 ENCOUNTER — Encounter: Payer: Self-pay | Admitting: Gastroenterology

## 2018-08-18 DIAGNOSIS — Z9889 Other specified postprocedural states: Secondary | ICD-10-CM | POA: Insufficient documentation

## 2018-08-18 DIAGNOSIS — K831 Obstruction of bile duct: Secondary | ICD-10-CM | POA: Insufficient documentation

## 2018-08-18 DIAGNOSIS — R1011 Right upper quadrant pain: Secondary | ICD-10-CM | POA: Insufficient documentation

## 2018-08-18 NOTE — Progress Notes (Signed)
Patient Care Team: [name], MD as PCP - General (Internal Medicine)  DIAGNOSIS:    ICD-10-CM   1. Metastases to the liver (H[facility]) C78.7   2. Brain metastases ([place]) C79.31   3. Metastatic breast cancer ([place]) C50.919     SUMMARY OF ONCOLOGIC HISTORY:   Metastatic breast cancer ([place])   06/05/2006 Initial Diagnosis    Left breast biopsy: DCIS with apocrine features ER 30%, PR 13%    06/19/2006 Surgery    Left lumpectomy: stage I, pT1a, pN0(i)(sn), pMX, 0.3 cm IDC, grade 1, with extensive high-grade DCIS , margins negative ER 38 %, PR 93 %, Ki-67 10%, HER-2/neu 2+, no additional tissue available for FISH testing, with 0/1 left axillary lymph nodes.    07/30/2006 - 09/17/2006 Radiation Therapy    Adjuvant radiation therapy    09/24/2006 - 09/25/2011 Anti-estrogen oral therapy    Antiestrogen therapy with Arimidex 5 years    04/26/2016 Relapse/Recurrence    Innumerable hepatic lesions seen in both lobes most are 1-2 cm size somewhat confluent largest in the posterior right hepatic dome 3 x 4.6 cm    05/07/2016 PET scan    PET/CT scan: Extensive metastatic involvement of both lobes of the liver, primary could be liver, phalangeal or metastatic disease, hypermetabolic thoracic LN left axillary, left supraclavicular and right CP angle LN, hypermetabolic upper abdominal LN    05/16/2016 Initial Biopsy    Liver biopsy: Metastatic carcinoma breast primary strong positivity for CK 7 weak focal positivity for ER, GCDFP, negative for PR, CDX 2, TTF-1, WT 1; HER-2 positive    06/13/2016 - 11/07/2016 Chemotherapy    Taxotere Herceptin and Perjeta every 3 weeks (Taxotere was held for elevated LFTs for cycles 1 and 2)     11/28/2016 - 05/06/2018 Chemotherapy    Herceptin maintenance therapy for metastatic breast cancer every 3 weeks     03/24/2018 Surgery    Chronic cholecystitis: Diagnostic laparoscopy: Due to intermittent attacks of biliary colic: The gallbladder was rockhard and not distensible  with tumor palpated in the porta hepatis, extensive lucent liver lesions, decision was made to stop the surgery.    05/12/2018 - 05/22/2018 Radiation Therapy    Radiation to the brain    05/29/2018 -  Chemotherapy    Kadcyla      CHIEF COMPLIANT: Follow-up on Kadcyla  INTERVAL HISTORY: Monique Figueroa is a 85 y.o. with above-mentioned history of metastatic breast cancer with brain metastasis currently on Kadcyla. She presents to the clinic today with her son. On 08/03/18 she had pain in her lower right abdomen that radiated to her back and middle abdomen for which she presented to the ED and a CT scan showed a slight increase in the hepatic tumor. She will follow up with her GI doctor, Dr. [facility], about switching her stent. She notes she is slightly nauseous this morning, and has been waking up nauseous most mornings, and asked for a refill of Compazine and Zofran. She denies diarrhea and notes occasional constipation.   REVIEW OF SYSTEMS:   Constitutional: Denies fevers, chills or abnormal weight loss Eyes: Denies blurriness of vision Ears, nose, mouth, throat, and face: Denies mucositis or sore throat Respiratory: Denies cough, dyspnea or wheezes Cardiovascular: Denies palpitation, chest discomfort Gastrointestinal:  Denies heartburn (+) occasional constipation (+) daily morning nausea Skin: Denies abnormal skin rashes Lymphatics: Denies new lymphadenopathy or easy bruising Neurological: Denies numbness, tingling or new weaknesses Behavioral/Psych: Mood is stable, no new changes  Extremities: No lower  extremity edema Breast: denies any pain or lumps or nodules in either breasts All other systems were reviewed with the patient and are negative.  I have reviewed the past medical history, past surgical history, social history and family history with the patient and they are unchanged from previous note.  ALLERGIES:  is allergic to perjeta [pertuzumab] and sulfa  antibiotics.  MEDICATIONS:  Current Outpatient Medications  Medication Sig Dispense Refill  . benzocaine (ORAJEL) 10 % mucosal gel Use as directed 1 application in the mouth or throat as needed for mouth pain.    . calcium carbonate (TUMS - DOSED IN MG ELEMENTAL CALCIUM) 500 MG chewable tablet Chew 1 tablet by mouth daily as needed for indigestion or heartburn.    . diphenhydrAMINE (BENADRYL) 25 MG tablet Take 25 mg by mouth daily as needed for allergies.    Marland Kitchen levothyroxine (SYNTHROID, LEVOTHROID) 125 MCG tablet Take 125 mcg by mouth daily before breakfast.     . metFORMIN (GLUCOPHAGE) 500 MG tablet Take 500 mg by mouth daily with breakfast.     . omeprazole (PRILOSEC OTC) 20 MG tablet Take 20 mg by mouth daily as needed (acid reflux).     . ondansetron (ZOFRAN-ODT) 8 MG disintegrating tablet Take 1 tablet (8 mg total) by mouth every 8 (eight) hours as needed for nausea or vomiting. 20 tablet 6  . potassium chloride (MICRO-K) 10 MEQ CR capsule Take 1 capsule (10 mEq total) by mouth 2 (two) times daily. 30 capsule 1  . potassium chloride SA (K-DUR,KLOR-CON) 20 MEQ tablet Take 1 tablet (20 mEq total) by mouth daily. 30 tablet 0  . prochlorperazine (COMPAZINE) 10 MG tablet Take 1 tablet (10 mg total) by mouth every 6 (six) hours as needed for nausea or vomiting. 30 tablet 6  . Propylene Glycol (SYSTANE COMPLETE) 0.6 % SOLN Place 1 drop into both eyes 2 (two) times daily.    . sodium chloride (OCEAN) 0.65 % SOLN nasal spray Place 1 spray into both nostrils as needed for congestion.     No current facility-administered medications for this visit.    Facility-Administered Medications Ordered in Other Visits  Medication Dose Route Frequency Provider Last Rate Last Dose  . sodium chloride flush (NS) 0.9 % injection 10 mL  10 mL Intravenous PRN Nicholas Lose, MD   10 mL at 08/20/18 0816    PHYSICAL EXAMINATION: ECOG PERFORMANCE STATUS: 2 - Symptomatic, <50% confined to bed  Vitals:   08/20/18  0824  BP: 128/72  Pulse: 98  Resp: 17  Temp: 98.5 F (36.9 C)  SpO2: 97%   Filed Weights   08/20/18 0824  Weight: 144 lb 6.4 oz (65.5 kg)    GENERAL: alert, no distress and comfortable SKIN: skin color, texture, turgor are normal, no rashes or significant lesions EYES: normal, Conjunctiva are pink and non-injected, sclera clear OROPHARYNX: no exudate, no erythema and lips, buccal mucosa, and tongue normal  NECK: supple, thyroid normal size, non-tender, without nodularity LYMPH: no palpable lymphadenopathy in the cervical, axillary or inguinal LUNGS: clear to auscultation and percussion with normal breathing effort HEART: regular rate & rhythm and no murmurs and no lower extremity edema ABDOMEN: abdomen soft, non-tender and normal bowel sounds MUSCULOSKELETAL: no cyanosis of digits and no clubbing  NEURO: alert & oriented x 3 with fluent speech, no focal motor/sensory deficits EXTREMITIES: No lower extremity edema  LABORATORY DATA:  I have reviewed the data as listed CMP Latest Ref Rng & Units 08/03/2018 07/30/2018 07/09/2018  Glucose  70 - 99 mg/dL 118(H) 89 94  BUN 8 - 23 mg/dL _0 Creatinine 0.44 - 1.00 mg/dL 0.56 0.59 0.68  Sodium 135 - 145 mmol/L 137 141 140  Potassium 3.5 - 5.1 mmol/L 3.0(L) 2.9(LL) 3.5  Chloride 98 - 111 mmol/L 103 112(H) 106  CO2 22 - 32 mmol/L 21(L) 21(L) 23  Calcium 8.9 - 10.3 mg/dL 8.8(L) 7.3(L) 8.8(L)  Total Protein 6.5 - 8.1 g/dL 6.8 5.6(L) 6.6  Total Bilirubin 0.3 - 1.2 mg/dL 0.7 0.6 0.7  Alkaline Phos 38 - 126 U/L 52 45 61  AST 15 - 41 U/L 46(H) 22 26  ALT 0 - 44 U/L _1 Lab Results  Component Value Date   WBC 3.6 (L) 08/20/2018   HGB 12.7 08/20/2018   HCT 38.7 08/20/2018   MCV 96.3 08/20/2018   PLT 173 08/20/2018   NEUTROABS 2.2 08/20/2018    ASSESSMENT & PLAN:  Metastatic breast cancer ([place]) Metastatic breast cancer with innumerable liver metastases detected on ultrasound and recent MRI of the liver on 04/26/2016 (Prior  history of left breast IDC T1 1 N0 stage IA 0.3 cm grade 1 tumor that was ER 38%, PR 93%, Ki-67 10%, HER-2 2+, no tissue for FISH testing, 0/1 lymph node negative, status post lumpectomy radiation and 5 years of Arimidex completed in February 2013)  PET/CT scan: 05/07/2016: Extensive metastatic involvement of both lobes of the liver, primary could be liver, phalangeal or metastatic disease, hypermetabolic thoracic lymph nodes left axillary, left supraclavicular and right CP angle lymph nodes, hypermetabolic upper abdominal lymph nodes  Liver biopsy10/07/2016: Metastatic carcinoma breast primary strong positivity for CK 7 weak focal positivity for ER, GCDFP, negative for PR, CDX 2, TTF-1, WT 1; HER-2 positive  Treatment plan: 1. Taxotere Herceptin and Perjeta every 3 weeks palliative chemotherapy started 06/13/16 (Perjeta discontinued after 5 cycles due to allergy) 2. followed by Herceptin maintenance stopped 05/08/2018 due to progression ----------------------------------------------------------------------------------------------------------------------------------------- Current treatment: Kadcyla starting 10/25/2019todayiscycle 3 Patient completed palliative radiation therapy to the brain Echo on 04/21/18 shows normal EF of 55-60%  Kadcyla toxicities: No side effects to Kadcyla. Hypokalemia: started micro K  CT abdomen pelvis 08/03/2018 done for abdominal pain: 7 x 5.2 cm right hepatic lobe lesion is slightly enlarged compared to prior exam multiple other smaller low densities are noted.  Radiology review: I discussed the CT scan with the patient and provided her with a copy of this report.  The scan was done very early into Kadcyla treatment.  I would like to continue the current treatment and repeat another scan in Feb end 2020.  Return to clinic every 3 weeks for Kadcyla.    No orders of the defined types were placed in this encounter.  The patient has a good understanding of  the overall plan. she agrees with it. she will call with any problems that may develop before the next visit here.  Nicholas Lose, MD 08/20/2018  Julious Oka Dorshimer am acting as scribe for Dr. Nicholas Lose.  I have reviewed the above documentation for accuracy and completeness, and I agree with the above.

## 2018-08-20 ENCOUNTER — Inpatient Hospital Stay: Payer: Medicare Other

## 2018-08-20 ENCOUNTER — Inpatient Hospital Stay: Payer: Medicare Other | Attending: Hematology and Oncology

## 2018-08-20 ENCOUNTER — Inpatient Hospital Stay: Payer: Medicare Other | Admitting: Nutrition

## 2018-08-20 ENCOUNTER — Inpatient Hospital Stay (HOSPITAL_BASED_OUTPATIENT_CLINIC_OR_DEPARTMENT_OTHER): Payer: Medicare Other | Admitting: Hematology and Oncology

## 2018-08-20 ENCOUNTER — Other Ambulatory Visit: Payer: Self-pay | Admitting: Hematology and Oncology

## 2018-08-20 ENCOUNTER — Telehealth: Payer: Self-pay | Admitting: Hematology and Oncology

## 2018-08-20 VITALS — BP 128/72 | HR 98 | Temp 98.5°F | Resp 17 | Ht 65.0 in | Wt 144.4 lb

## 2018-08-20 DIAGNOSIS — C50912 Malignant neoplasm of unspecified site of left female breast: Secondary | ICD-10-CM | POA: Insufficient documentation

## 2018-08-20 DIAGNOSIS — E876 Hypokalemia: Secondary | ICD-10-CM

## 2018-08-20 DIAGNOSIS — Z5112 Encounter for antineoplastic immunotherapy: Secondary | ICD-10-CM | POA: Diagnosis not present

## 2018-08-20 DIAGNOSIS — C7931 Secondary malignant neoplasm of brain: Secondary | ICD-10-CM | POA: Diagnosis not present

## 2018-08-20 DIAGNOSIS — C50919 Malignant neoplasm of unspecified site of unspecified female breast: Secondary | ICD-10-CM

## 2018-08-20 DIAGNOSIS — C787 Secondary malignant neoplasm of liver and intrahepatic bile duct: Secondary | ICD-10-CM | POA: Diagnosis not present

## 2018-08-20 DIAGNOSIS — Z79899 Other long term (current) drug therapy: Secondary | ICD-10-CM | POA: Diagnosis not present

## 2018-08-20 DIAGNOSIS — Z7984 Long term (current) use of oral hypoglycemic drugs: Secondary | ICD-10-CM | POA: Insufficient documentation

## 2018-08-20 DIAGNOSIS — Z923 Personal history of irradiation: Secondary | ICD-10-CM | POA: Insufficient documentation

## 2018-08-20 DIAGNOSIS — Z95828 Presence of other vascular implants and grafts: Secondary | ICD-10-CM

## 2018-08-20 LAB — CBC WITH DIFFERENTIAL (CANCER CENTER ONLY)
ABS IMMATURE GRANULOCYTES: 0.01 10*3/uL (ref 0.00–0.07)
Basophils Absolute: 0 10*3/uL (ref 0.0–0.1)
Basophils Relative: 1 %
Eosinophils Absolute: 0.1 10*3/uL (ref 0.0–0.5)
Eosinophils Relative: 2 %
HCT: 38.7 % (ref 36.0–46.0)
HEMOGLOBIN: 12.7 g/dL (ref 12.0–15.0)
Immature Granulocytes: 0 %
LYMPHS ABS: 0.8 10*3/uL (ref 0.7–4.0)
LYMPHS PCT: 21 %
MCH: 31.6 pg (ref 26.0–34.0)
MCHC: 32.8 g/dL (ref 30.0–36.0)
MCV: 96.3 fL (ref 80.0–100.0)
Monocytes Absolute: 0.5 10*3/uL (ref 0.1–1.0)
Monocytes Relative: 13 %
Neutro Abs: 2.2 10*3/uL (ref 1.7–7.7)
Neutrophils Relative %: 63 %
Platelet Count: 173 10*3/uL (ref 150–400)
RBC: 4.02 MIL/uL (ref 3.87–5.11)
RDW: 13.8 % (ref 11.5–15.5)
WBC Count: 3.6 10*3/uL — ABNORMAL LOW (ref 4.0–10.5)
nRBC: 0 % (ref 0.0–0.2)

## 2018-08-20 LAB — CMP (CANCER CENTER ONLY)
ALK PHOS: 58 U/L (ref 38–126)
ALT: 14 U/L (ref 0–44)
AST: 32 U/L (ref 15–41)
Albumin: 3.5 g/dL (ref 3.5–5.0)
Anion gap: 10 (ref 5–15)
BUN: 12 mg/dL (ref 8–23)
CO2: 28 mmol/L (ref 22–32)
Calcium: 9.5 mg/dL (ref 8.9–10.3)
Chloride: 101 mmol/L (ref 98–111)
Creatinine: 0.67 mg/dL (ref 0.44–1.00)
GFR, Est AFR Am: >60 mL/min (ref >60)
GFR, Estimated: >60 mL/min (ref >60)
Glucose, Bld: 93 mg/dL (ref 70–99)
Potassium: 3 mmol/L — CL (ref 3.5–5.1)
Sodium: 139 mmol/L (ref 135–145)
Total Bilirubin: 1 mg/dL (ref 0.3–1.2)
Total Protein: 7.1 g/dL (ref 6.5–8.1)

## 2018-08-20 MED ORDER — SODIUM CHLORIDE 0.9 % IV SOLN
3.0000 mg/kg | Freq: Once | INTRAVENOUS | Status: AC
  Administered 2018-08-20: 200 mg via INTRAVENOUS
  Filled 2018-08-20: qty 10

## 2018-08-20 MED ORDER — ACETAMINOPHEN 325 MG PO TABS
ORAL_TABLET | ORAL | Status: AC
  Filled 2018-08-20: qty 2

## 2018-08-20 MED ORDER — HEPARIN SOD (PORK) LOCK FLUSH 100 UNIT/ML IV SOLN
500.0000 [IU] | Freq: Once | INTRAVENOUS | Status: AC | PRN
  Administered 2018-08-20: 500 [IU]
  Filled 2018-08-20: qty 5

## 2018-08-20 MED ORDER — DIPHENHYDRAMINE HCL 25 MG PO CAPS
ORAL_CAPSULE | ORAL | Status: AC
  Filled 2018-08-20: qty 2

## 2018-08-20 MED ORDER — ONDANSETRON 8 MG PO TBDP: 8.0000 mg | ORAL_TABLET | Freq: Three times a day (TID) | ORAL | 6 refills | Status: DC | PRN

## 2018-08-20 MED ORDER — ACETAMINOPHEN 325 MG PO TABS
650.0000 mg | ORAL_TABLET | Freq: Once | ORAL | Status: AC
  Administered 2018-08-20: 650 mg via ORAL

## 2018-08-20 MED ORDER — POTASSIUM CHLORIDE ER 10 MEQ PO CPCR: 10.0000 meq | ORAL_CAPSULE | Freq: Two times a day (BID) | ORAL | 1 refills | Status: DC

## 2018-08-20 MED ORDER — PROCHLORPERAZINE MALEATE 10 MG PO TABS: 10.0000 mg | ORAL_TABLET | Freq: Four times a day (QID) | ORAL | 6 refills | Status: AC | PRN

## 2018-08-20 MED ORDER — DIPHENHYDRAMINE HCL 25 MG PO CAPS
50.0000 mg | ORAL_CAPSULE | Freq: Once | ORAL | Status: AC
  Administered 2018-08-20: 50 mg via ORAL

## 2018-08-20 MED ORDER — SODIUM CHLORIDE 0.9% FLUSH
10.0000 mL | INTRAVENOUS | Status: DC | PRN
  Administered 2018-08-20: 10 mL
  Filled 2018-08-20: qty 10

## 2018-08-20 MED ORDER — SODIUM CHLORIDE 0.9% FLUSH
10.0000 mL | INTRAVENOUS | Status: DC | PRN
  Administered 2018-08-20: 10 mL via INTRAVENOUS
  Filled 2018-08-20: qty 10

## 2018-08-20 MED ORDER — SODIUM CHLORIDE 0.9 % IV SOLN
Freq: Once | INTRAVENOUS | Status: AC
  Administered 2018-08-20: 10:00:00 via INTRAVENOUS
  Filled 2018-08-20: qty 250

## 2018-08-20 NOTE — Patient Instructions (Signed)
[facility] Discharge Instructions for Patients Receiving Chemotherapy  Today you received the following chemotherapy agents :  Kadcyla.  To help prevent nausea and vomiting after your treatment, we encourage you to take your nausea medication as prescribed.   If you develop nausea and vomiting that is not controlled by your nausea medication, call the clinic.   BELOW ARE SYMPTOMS THAT SHOULD BE REPORTED IMMEDIATELY:  *FEVER GREATER THAN 100.5 F  *CHILLS WITH OR WITHOUT FEVER  NAUSEA AND VOMITING THAT IS NOT CONTROLLED WITH YOUR NAUSEA MEDICATION  *UNUSUAL SHORTNESS OF BREATH  *UNUSUAL BRUISING OR BLEEDING  TENDERNESS IN MOUTH AND THROAT WITH OR WITHOUT PRESENCE OF ULCERS  *URINARY PROBLEMS  *BOWEL PROBLEMS  UNUSUAL RASH Items with * indicate a potential emergency and should be followed up as soon as possible.  Feel free to call the clinic should you have any questions or concerns. The clinic phone number is [phone].  Please show the CHEMO ALERT CARD at check-in to the Emergency Department and triage nurse.   

## 2018-08-20 NOTE — Assessment & Plan Note (Signed)
Metastatic breast cancer with innumerable liver metastases detected on ultrasound and recent MRI of the liver on 04/26/2016 (Prior history of left breast IDC T1 1 N0 stage IA 0.3 cm grade 1 tumor that was ER 38%, PR 93%, Ki-67 10%, HER-2 2+, no tissue for FISH testing, 0/1 lymph node negative, status post lumpectomy radiation and 5 years of Arimidex completed in February 2013)  PET/CT scan: 05/07/2016: Extensive metastatic involvement of both lobes of the liver, primary could be liver, phalangeal or metastatic disease, hypermetabolic thoracic lymph nodes left axillary, left supraclavicular and right CP angle lymph nodes, hypermetabolic upper abdominal lymph nodes  Liver biopsy10/07/2016: Metastatic carcinoma breast primary strong positivity for CK 7 weak focal positivity for ER, GCDFP, negative for PR, CDX 2, TTF-1, WT 1; HER-2 positive  Treatment plan: 1. Taxotere Herceptin and Perjeta every 3 weeks palliative chemotherapy started 06/13/16 (Perjeta discontinued after 5 cycles due to allergy) 2. followed by Herceptin maintenance stopped 05/08/2018 due to progression ----------------------------------------------------------------------------------------------------------------------------------------- Current treatment: Kadcyla starting 10/25/2019todayiscycle 3 Patient completed palliative radiation therapy to the brain Echo on 04/21/18 shows normal EF of 55-60%  Kadcyla toxicities: No side effects to Kadcyla.  CT abdomen pelvis 08/03/2018 done for abdominal pain: 7 x 5.2 cm right hepatic lobe lesion is slightly enlarged compared to prior exam multiple other smaller low densities are noted.  Radiology review: I discussed the CT scan with the patient and provided her with a copy of this report.  The scan was done very early into Kadcyla treatment.  I would like to continue the current treatment and repeat another scan in March 2020.  Return to clinic every 3 weeks for Kadcyla.

## 2018-08-20 NOTE — Progress Notes (Signed)
Nutrition follow-up completed with patient receiving treatment for metastatic breast cancer. Weight decreased and documented as 144 pounds January 16. Noted potassium 3.0. Patient continues to drink Ensure clear.  She has not yet tried [employer]. Patient denies other nutrition impact symptoms.  Nutrition diagnosis: Inadequate oral intake continues.  Intervention: I provided patient with a sample of Ensure Enlive.  She tried it today and stated she likes it. Recommended patient add 1 Ensure Enlive daily.  She can continue to drink Ensure clear as desired. Provided education on foods high in potassium and provided fact sheets. Questions were answered.  Teach back method used. Provided coupons for oral nutrition supplements.  Monitoring, evaluation, goals: Patient will work to increase calories and protein to minimize further weight loss.  Next visit: Thursday, February 6 during infusion.  **Disclaimer: This note was dictated with voice recognition software. Similar sounding words can inadvertently be transcribed and this note may contain transcription errors which may not have been corrected upon publication of note.**

## 2018-08-20 NOTE — Telephone Encounter (Signed)
Per 1/16 no los °

## 2018-08-28 ENCOUNTER — Ambulatory Visit (HOSPITAL_COMMUNITY)
Admission: RE | Admit: 2018-08-28 | Discharge: 2018-08-28 | Disposition: A | Discharge status: Home / Self Care | Payer: Medicare Other | Source: Ambulatory Visit | Attending: Radiation Oncology | Admitting: Radiation Oncology

## 2018-08-28 DIAGNOSIS — C7949 Secondary malignant neoplasm of other parts of nervous system: Secondary | ICD-10-CM | POA: Insufficient documentation

## 2018-08-28 DIAGNOSIS — C7931 Secondary malignant neoplasm of brain: Secondary | ICD-10-CM | POA: Insufficient documentation

## 2018-08-28 DIAGNOSIS — R51 Headache: Secondary | ICD-10-CM | POA: Diagnosis not present

## 2018-08-28 MED ORDER — GADOBUTROL 1 MMOL/ML IV SOLN
6.0000 mL | Freq: Once | INTRAVENOUS | Status: AC | PRN
  Administered 2018-08-28: 6 mL via INTRAVENOUS

## 2018-09-02 ENCOUNTER — Encounter: Payer: Self-pay | Admitting: Urology

## 2018-09-02 ENCOUNTER — Ambulatory Visit
Admission: RE | Admit: 2018-09-02 | Discharge: 2018-09-02 | Disposition: A | Discharge status: Home / Self Care | Payer: Medicare Other | Source: Ambulatory Visit | Attending: Urology | Admitting: Urology

## 2018-09-02 ENCOUNTER — Other Ambulatory Visit: Payer: Self-pay

## 2018-09-02 VITALS — BP 145/81 | HR 101 | Temp 98.5°F | Resp 18 | Wt 141.4 lb

## 2018-09-02 DIAGNOSIS — Z923 Personal history of irradiation: Secondary | ICD-10-CM | POA: Diagnosis not present

## 2018-09-02 DIAGNOSIS — C50912 Malignant neoplasm of unspecified site of left female breast: Secondary | ICD-10-CM | POA: Diagnosis not present

## 2018-09-02 DIAGNOSIS — Z5112 Encounter for antineoplastic immunotherapy: Secondary | ICD-10-CM | POA: Diagnosis not present

## 2018-09-02 DIAGNOSIS — C787 Secondary malignant neoplasm of liver and intrahepatic bile duct: Secondary | ICD-10-CM | POA: Diagnosis not present

## 2018-09-02 DIAGNOSIS — Z853 Personal history of malignant neoplasm of breast: Secondary | ICD-10-CM | POA: Diagnosis not present

## 2018-09-02 DIAGNOSIS — C7931 Secondary malignant neoplasm of brain: Secondary | ICD-10-CM | POA: Diagnosis not present

## 2018-09-02 DIAGNOSIS — Z7984 Long term (current) use of oral hypoglycemic drugs: Secondary | ICD-10-CM | POA: Diagnosis not present

## 2018-09-02 DIAGNOSIS — Z79899 Other long term (current) drug therapy: Secondary | ICD-10-CM | POA: Diagnosis not present

## 2018-09-02 DIAGNOSIS — E876 Hypokalemia: Secondary | ICD-10-CM | POA: Diagnosis not present

## 2018-09-02 DIAGNOSIS — Z08 Encounter for follow-up examination after completed treatment for malignant neoplasm: Secondary | ICD-10-CM | POA: Diagnosis not present

## 2018-09-02 NOTE — Progress Notes (Signed)
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  She had a recent follow up MRI brain on 08/28/18 which showed only 3 of the original 5 cerebellar metastases remain visible, and of these, 1 is only punctate. The 2 remaining lesions have each decreased in size, and cerebellar edema and posterior fossa mass effect have resolved.  There is a small focus of artifact suspected in the right inferior frontal gyrus following contrast on series 11, image 44 which we will continue to monitor on follow up imaging. No new metastatic disease identified.  She remains on targeted therapy with Kadcyla which was started on 05/29/2018 under the care and direction of Dr. [name]. She has completed 3 cycles and continues to tolerate this well.  She has a scheduled follow up with Dr. [name] on 09/10/18.  She is scheduled for a repeat ERCP with Dr. Rush Landmark on 09/16/18 for biliary stent exchange in hopes that this will improve her intermittent RUQ abdominal pain and nausea.                   On review of systems, the patient states that she is doing very well overall.  She is accompanied by her son today who reports noticing continued improvement in her energy, coordination and gait since completion of her radiotherapy.  She is without complaints,  She has residual blurry vision in the left eye, unchanged since the time of her diagnosis.  She denies headaches, new changes in her visual or auditory acuity, dizziness, imbalance, tremor or seizure activity. She has a new 70-week-old great-grandson that  she is looking forward to going to visit in [place], [place]!  ALLERGIES:  is allergic to perjeta [pertuzumab] and sulfa antibiotics.  Meds: Current Outpatient Medications  Medication Sig Dispense Refill  . benzocaine (ORAJEL) 10 % mucosal gel Use as directed 1 application in the mouth or throat as needed for mouth pain.    . calcium carbonate (TUMS - DOSED IN MG ELEMENTAL CALCIUM) 500 MG chewable tablet Chew 1 tablet by mouth daily as needed for indigestion or heartburn.    . diphenhydrAMINE (BENADRYL) 25 MG tablet Take 25 mg by mouth daily as needed for allergies.    Marland Kitchen levothyroxine (SYNTHROID, LEVOTHROID) 125 MCG tablet Take 125 mcg by mouth daily before breakfast.     . metFORMIN (GLUCOPHAGE) 500 MG tablet Take 500 mg by mouth daily with breakfast.     . omeprazole (PRILOSEC OTC) 20 MG tablet Take 20 mg by mouth daily as needed (acid reflux).     . ondansetron (ZOFRAN-ODT) 8 MG disintegrating tablet TAKE 1 TABLET (8 MG TOTAL) BY MOUTH EVERY 8 (EIGHT) HOURS AS NEEDED FOR NAUSEA OR VOMITING. 20 tablet 1  . potassium chloride (MICRO-K) 10 MEQ CR capsule Take 1 capsule (10 mEq total) by mouth 2 (two) times daily. 30 capsule 1  . potassium chloride SA (K-DUR,KLOR-CON) 20 MEQ tablet Take 1 tablet (20 mEq total) by mouth daily. 30 tablet 0  . prochlorperazine (COMPAZINE) 10 MG tablet Take 1  tablet (10 mg total) by mouth every 6 (six) hours as needed for nausea or vomiting. 30 tablet 6  . Propylene Glycol (SYSTANE COMPLETE) 0.6 % SOLN Place 1 drop into both eyes 2 (two) times daily.    . sodium chloride (OCEAN) 0.65 % SOLN nasal spray Place 1 spray into both nostrils as needed for congestion.     No current facility-administered medications for this encounter.     Physical Findings:  weight is 141 lb 6.4 oz (64.1 kg). Her oral temperature is 98.5 F (36.9 C). Her blood pressure is 145/81 (abnormal) and her pulse is 101 (abnormal). Her respiration is 18 and oxygen saturation is 99%.  Pain  Assessment Pain Score: 0-No pain/10 In general this is a well appearing Caucasian female in no acute distress.  She's alert and oriented x4 and appropriate throughout the examination. Cardiopulmonary assessment is negative for acute distress and she exhibits normal effort.  EOMs are intact bilaterally and PERRLA.  Her speech is appropriate and well organized.  Strength is 5 out of 5 and equal bilaterally in the upper and lower extremities.  Sensation is intact to light touch bilaterally in the upper and lower extremities.  Lab Findings: Lab Results  Component Value Date   WBC 3.6 (L) 08/20/2018   HGB 12.7 08/20/2018   HCT 38.7 08/20/2018   MCV 96.3 08/20/2018   PLT 173 08/20/2018     Radiographic Findings: Mr Jeri Cos HM Contrast  Result Date: 08/28/2018 CLINICAL DATA:  85 year old female status focused, hypofractionated radiotherapy posterior fossa radiation in October for breast cancer metastases to the cerebellum. Restaging. EXAM: MRI HEAD WITHOUT AND WITH CONTRAST TECHNIQUE: Multiplanar, multiecho pulse sequences of the brain and surrounding structures were obtained without and with intravenous contrast. CONTRAST:  6 milliliters Gadavist COMPARISON:  Brain MRI 05/07/2018.  Earlier head CTs. FINDINGS: Brain: Cerebellar edema and posterior fossa mass effect has resolved. Three of the original five cerebellar metastases remain visible following contrast, but one of these is only punctate on series 10, image 25 (previously 10 millimeters). An enhancing medial right cerebellar tonsil metastasis measures 13 millimeters today (24 previously), series 10, image 31. A posterior medial right cerebellar metastasis measures 19 millimeters today (26 millimeters previously), image 74. An indistinct 3-4 millimeter hyperintense focus in the right inferior frontal gyrus seen only on coronal postcontrast series 11, image 44 seems to be artifact. No other abnormal enhancement or new brain metastasis is identified.  No restricted diffusion to suggest acute infarction. Patchy widely scattered bilateral cerebral white matter T2 and FLAIR hyperintensity is stable. A slightly heterogeneous appearance of the pineal gland, most conspicuous on series 6, image 28, seems benign. No midline shift, ventriculomegaly, extra-axial collection or acute intracranial hemorrhage. Possible chronic microhemorrhage in the right lentiform. Cervicomedullary junction and pituitary are within normal limits. Vascular: Major intracranial vascular flow voids are preserved. Skull and upper cervical spine: Negative visible cervical spine and spinal cord. Visualized bone marrow signal is within normal limits. Sinuses/Orbits: Stable and negative. Other: Visible internal auditory structures appear normal. Trace mastoid fluid appears inconsequential. Negative nasopharynx. Scalp and face soft tissues appear negative. IMPRESSION: 1. Following posterior fossa radiation only 3 of the original 5 cerebellar metastases remain visible, and of these 1 is only punctate. The 2 remaining lesions have each decreased in size, and cerebellar edema and posterior fossa mass effect have resolved. 2. Small focus of artifact suspected in the right inferior frontal gyrus following contrast on series 11, image 44 with attention directed on  follow-up. No new metastatic disease identified. Electronically Signed   By: Genevie Ann M.D.   On: 08/28/2018 14:46    Impression/Plan: 1. 85 yo woman with 5 cerebellar brain metastases from breast cancer. She appears to have recovered well from the effects of her recent radiotherapy.  She is currently without complaints and encouraged by her progress to date.  Her recent post-treatment MRI shows an excellent response to treatment with only 3 of the original 5 cerebellar metastases remaining visible, and of these, 1 is only punctate. The 2 remaining lesions have each decreased in size, and cerebellar edema and posterior fossa mass effect have  resolved.  There is a small focus of artifact suspected in the right inferior frontal gyrus following contrast on series 11, image 44 which we will continue to monitor on follow up imaging but no new metastatic disease identified. We discussed the plan to continue with serial MRI brain scans every 3 months to monitor for disease recurrence or progression and I will see her back in the office following each scan to review results and recommendations from multidisciplinary brain conference. She will also continue in routine follow-up under the care and direction of Dr. [name].  She and her son are comfortable with and in agreement with this plan.  She knows to call us at anytime with any concerns or questions regarding her recent radiotherapy.    Nicholos Johns, PA-C

## 2018-09-09 NOTE — Progress Notes (Signed)
Patient Care Team: [name], MD as PCP - General (Internal Medicine)  DIAGNOSIS:    ICD-10-CM   1. Metastases to the liver [place]) C78.7 CT Abdomen Pelvis W Contrast    CT Chest W Contrast  2. Brain metastases ([place]) C79.31   3. Metastatic breast cancer ([place]) C50.919 CT Abdomen Pelvis W Contrast    CT Chest W Contrast    SUMMARY OF ONCOLOGIC HISTORY:   Metastatic breast cancer ([place])   06/05/2006 Initial Diagnosis    Left breast biopsy: DCIS with apocrine features ER 30%, PR 13%    06/19/2006 Surgery    Left lumpectomy: stage I, pT1a, pN0(i)(sn), pMX, 0.3 cm IDC, grade 1, with extensive high-grade DCIS , margins negative ER 38 %, PR 93 %, Ki-67 10%, HER-2/neu 2+, no additional tissue available for FISH testing, with 0/1 left axillary lymph nodes.    07/30/2006 - 09/17/2006 Radiation Therapy    Adjuvant radiation therapy    09/24/2006 - 09/25/2011 Anti-estrogen oral therapy    Antiestrogen therapy with Arimidex 5 years    04/26/2016 Relapse/Recurrence    Innumerable hepatic lesions seen in both lobes most are 1-2 cm size somewhat confluent largest in the posterior right hepatic dome 3 x 4.6 cm    05/07/2016 PET scan    PET/CT scan: Extensive metastatic involvement of both lobes of the liver, primary could be liver, phalangeal or metastatic disease, hypermetabolic thoracic LN left axillary, left supraclavicular and right CP angle LN, hypermetabolic upper abdominal LN    05/16/2016 Initial Biopsy    Liver biopsy: Metastatic carcinoma breast primary strong positivity for CK 7 weak focal positivity for ER, GCDFP, negative for PR, CDX 2, TTF-1, WT 1; HER-2 positive    06/13/2016 - 11/07/2016 Chemotherapy    Taxotere Herceptin and Perjeta every 3 weeks (Taxotere was held for elevated LFTs for cycles 1 and 2)     11/28/2016 - 05/06/2018 Chemotherapy    Herceptin maintenance therapy for metastatic breast cancer every 3 weeks     03/24/2018 Surgery    Chronic cholecystitis: Diagnostic  laparoscopy: Due to intermittent attacks of biliary colic: The gallbladder was rockhard and not distensible with tumor palpated in the porta hepatis, extensive lucent liver lesions, decision was made to stop the surgery.    05/12/2018 - 05/22/2018 Radiation Therapy    Radiation to the brain    05/29/2018 -  Chemotherapy    Kadcyla      CHIEF COMPLIANT: Follow-up on Kadcyla  INTERVAL HISTORY: Monique Figueroa is a 85 y.o. with above-mentioned history of metastatic breast cancer with brain metastasis currently on Kadcyla. A brain MRI on 08/28/18 showed no ew metastatic disease, with only 3 of the 5 originally visible metastases visible and which have decreased in size. She presents to the clinic today with her son. She reports the last 3 weeks went well. She reports hair loss, denies nausea, vomiting, or any neuropathy. Her lab work from today shows: WBC 3.4, platelets 143, Hg 12.6, albumin 3.3, potassium 3.6. She is currently on a potassium supplement.   REVIEW OF SYSTEMS:   Constitutional: Denies fevers, chills or abnormal weight loss (+) hair loss Eyes: Denies blurriness of vision Ears, nose, mouth, throat, and face: Denies mucositis or sore throat Respiratory: Denies cough, dyspnea or wheezes Cardiovascular: Denies palpitation, chest discomfort Gastrointestinal: Denies nausea, heartburn or change in bowel habits Skin: Denies abnormal skin rashes Lymphatics: Denies new lymphadenopathy or easy bruising Neurological: Denies numbness, tingling or new weaknesses Behavioral/Psych: Mood is stable,  no new changes  Extremities: No lower extremity edema Breast: denies any pain or lumps or nodules in either breasts All other systems were reviewed with the patient and are negative.  I have reviewed the past medical history, past surgical history, social history and family history with the patient and they are unchanged from previous note.  ALLERGIES:  is allergic to perjeta [pertuzumab] and  sulfa antibiotics.  MEDICATIONS:  Current Outpatient Medications  Medication Sig Dispense Refill  . acetaminophen (TYLENOL) 500 MG tablet Take 500 mg by mouth 2 (two) times daily as needed for moderate pain or headache.    . benzocaine (ORAJEL) 10 % mucosal gel Use as directed 1 application in the mouth or throat as needed for mouth pain.    . calcium carbonate (TUMS - DOSED IN MG ELEMENTAL CALCIUM) 500 MG chewable tablet Chew 1 tablet by mouth 2 (two) times daily as needed for indigestion or heartburn.     . diphenhydrAMINE (BENADRYL) 25 MG tablet Take 25 mg by mouth daily as needed for allergies.    . hydrochlorothiazide (HYDRODIURIL) 25 MG tablet Take 25 mg by mouth daily.    Marland Kitchen levothyroxine (SYNTHROID, LEVOTHROID) 125 MCG tablet Take 125 mcg by mouth daily before breakfast.     . Liniments (BLUE-EMU SUPER STRENGTH EX) Apply 1 application topically daily as needed (muscle pain).    . metFORMIN (GLUCOPHAGE) 500 MG tablet Take 500 mg by mouth daily with breakfast.     . omeprazole (PRILOSEC OTC) 20 MG tablet Take 20 mg by mouth daily as needed (acid reflux).     . ondansetron (ZOFRAN-ODT) 8 MG disintegrating tablet TAKE 1 TABLET (8 MG TOTAL) BY MOUTH EVERY 8 (EIGHT) HOURS AS NEEDED FOR NAUSEA OR VOMITING. 20 tablet 1  . oxyCODONE-acetaminophen (PERCOCET/ROXICET) 5-325 MG tablet Take 1 tablet by mouth daily as needed for severe pain.    Marland Kitchen Phenazopyridine HCl (AZO-STANDARD PO) Take 1 tablet by mouth 2 (two) times daily as needed (uti symptoms).    . potassium chloride (MICRO-K) 10 MEQ CR capsule Take 1 capsule (10 mEq total) by mouth 2 (two) times daily. 30 capsule 1  . potassium chloride SA (K-DUR,KLOR-CON) 20 MEQ tablet Take 1 tablet (20 mEq total) by mouth daily. (Patient not taking: Reported on 09/07/2018) 30 tablet 0  . prochlorperazine (COMPAZINE) 10 MG tablet Take 1 tablet (10 mg total) by mouth every 6 (six) hours as needed for nausea or vomiting. 30 tablet 6  . Propylene Glycol (SYSTANE  COMPLETE) 0.6 % SOLN Place 1 drop into both eyes 2 (two) times daily.    . sodium chloride (OCEAN) 0.65 % SOLN nasal spray Place 1 spray into both nostrils as needed for congestion.     No current facility-administered medications for this visit.    Facility-Administered Medications Ordered in Other Visits  Medication Dose Route Frequency Provider Last Rate Last Dose  . sodium chloride flush (NS) 0.9 % injection 10 mL  10 mL Intravenous PRN Nicholas Lose, MD   10 mL at 09/10/18 0924    PHYSICAL EXAMINATION: ECOG PERFORMANCE STATUS: 1 - Symptomatic but completely ambulatory  Vitals:   09/10/18 1006  BP: (!) 143/70  Pulse: 85  Resp: 17  Temp: 97.8 F (36.6 C)  SpO2: 99%   Filed Weights   09/10/18 1006  Weight: 145 lb 1.6 oz (65.8 kg)    GENERAL: alert, no distress and comfortable SKIN: skin color, texture, turgor are normal, no rashes or significant lesions EYES: normal, Conjunctiva are  pink and non-injected, sclera clear OROPHARYNX: no exudate, no erythema and lips, buccal mucosa, and tongue normal  NECK: supple, thyroid normal size, non-tender, without nodularity LYMPH: no palpable lymphadenopathy in the cervical, axillary or inguinal LUNGS: clear to auscultation and percussion with normal breathing effort HEART: regular rate & rhythm and no murmurs and no lower extremity edema ABDOMEN: abdomen soft, non-tender and normal bowel sounds MUSCULOSKELETAL: no cyanosis of digits and no clubbing  NEURO: alert & oriented x 3 with fluent speech, no focal motor/sensory deficits EXTREMITIES: No lower extremity edema  LABORATORY DATA:  I have reviewed the data as listed CMP Latest Ref Rng & Units 09/10/2018 08/20/2018 08/03/2018  Glucose 70 - 99 mg/dL 101(H) 93 118(H)  BUN 8 - 23 mg/dL _0 Creatinine 0.44 - 1.00 mg/dL 0.69 0.67 0.56  Sodium 135 - 145 mmol/L 140 139 137  Potassium 3.5 - 5.1 mmol/L 3.6 3.0(LL) 3.0(L)  Chloride 98 - 111 mmol/L 105 101 103  CO2 22 - 32 mmol/L 25 28  21(L)  Calcium 8.9 - 10.3 mg/dL 9.0 9.5 8.8(L)  Total Protein 6.5 - 8.1 g/dL 6.9 7.1 6.8  Total Bilirubin 0.3 - 1.2 mg/dL 0.7 1.0 0.7  Alkaline Phos 38 - 126 U/L 66 58 52  AST 15 - 41 U/L 34 32 46(H)  ALT 0 - 44 U/L _1 Lab Results  Component Value Date   WBC 3.4 (L) 09/10/2018   HGB 12.6 09/10/2018   HCT 38.1 09/10/2018   MCV 96.0 09/10/2018   PLT 143 (L) 09/10/2018   NEUTROABS 2.1 09/10/2018    ASSESSMENT & PLAN:  Metastatic breast cancer ([place]) Metastatic breast cancer with innumerable liver metastases detected on ultrasound and recent MRI of the liver on 04/26/2016 (Prior history of left breast IDC T1 1 N0 stage IA 0.3 cm grade 1 tumor that was ER 38%, PR 93%, Ki-67 10%, HER-2 2+, no tissue for FISH testing, 0/1 lymph node negative, status post lumpectomy radiation and 5 years of Arimidex completed in February 2013)  PET/CT scan: 05/07/2016: Extensive metastatic involvement of both lobes of the liver, primary could be liver, phalangeal or metastatic disease, hypermetabolic thoracic lymph nodes left axillary, left supraclavicular and right CP angle lymph nodes, hypermetabolic upper abdominal lymph nodes  Liver biopsy10/07/2016: Metastatic carcinoma breast primary strong positivity for CK 7 weak focal positivity for ER, GCDFP, negative for PR, CDX 2, TTF-1, WT 1; HER-2 positive  Treatment plan: 1. Taxotere Herceptin and Perjeta every 3 weeks palliative chemotherapy started 06/13/16 (Perjeta discontinued after 5 cycles due to allergy) 2. followed by Herceptin maintenance stopped 05/08/2018 due to progression ----------------------------------------------------------------------------------------------------------------------------------------- Current treatment: Kadcyla starting 10/25/2019todayiscycle 3 Patient completed palliative radiation therapy to the brain Echo on 04/21/18 shows normal EF of 55-60%  Kadcyla toxicities: No side effects to Kadcyla. Hypokalemia:  started micro K  CT abdomen pelvis 08/03/2018 done for abdominal pain: 7 x 5.2 cm right hepatic lobe lesion is slightly enlarged compared to prior exam multiple other smaller low densities are noted.  Radiology review: I discussed the CT scan with the patient and provided her with a copy of this report.  The scan was done very early into Kadcyla treatment.  I would like to continue the current treatment and repeat another scan in Feb end 2020.  Return to clinic every 3 weeks for Kadcyla.  Repeat CT scan at the end of February.  Follow-up after that to discuss results.    Orders Placed This Encounter  Procedures  . CT Abdomen Pelvis W Contrast    Standing Status:   Future    Standing Expiration Date:   09/10/2019    Order Specific Question:   ** REASON FOR EXAM (FREE TEXT)    Answer:   Mayer Camel met breast cancer    Order Specific Question:   If indicated for the ordered procedure, I authorize the administration of contrast media per Radiology protocol    Answer:   Yes    Order Specific Question:   Preferred imaging location?    Answer:   [facility]    Order Specific Question:   Is Oral Contrast requested for this exam?    Answer:   Yes, Per Radiology protocol    Order Specific Question:   Radiology Contrast Protocol - do NOT remove file path    Answer:   \\[email or web address]  . CT Chest W Contrast    Standing Status:   Future    Standing Expiration Date:   09/10/2019    Order Specific Question:   ** REASON FOR EXAM (FREE TEXT)    Answer:   Met Breast cancer restaging    Order Specific Question:   If indicated for the ordered procedure, I authorize the administration of contrast media per Radiology protocol    Answer:   Yes    Order Specific Question:   Preferred imaging location?    Answer:   [facility]    Order Specific Question:   Radiology Contrast Protocol - do NOT remove file path    Answer:    \\[email or web address]   The patient has a good understanding of the overall plan. she agrees with it. she will call with any problems that may develop before the next visit here.  Nicholas Lose, MD 09/10/2018  Julious Oka Dorshimer am acting as scribe for Dr. Nicholas Lose.  I have reviewed the above documentation for accuracy and completeness, and I agree with the above.

## 2018-09-10 ENCOUNTER — Inpatient Hospital Stay: Payer: Medicare Other

## 2018-09-10 ENCOUNTER — Inpatient Hospital Stay: Payer: Medicare Other | Admitting: Hematology and Oncology

## 2018-09-10 ENCOUNTER — Telehealth: Payer: Self-pay

## 2018-09-10 ENCOUNTER — Inpatient Hospital Stay: Payer: Medicare Other | Admitting: Nutrition

## 2018-09-10 ENCOUNTER — Telehealth: Payer: Self-pay | Admitting: Hematology and Oncology

## 2018-09-10 ENCOUNTER — Inpatient Hospital Stay: Payer: Medicare Other | Attending: Hematology and Oncology

## 2018-09-10 VITALS — BP 143/70 | HR 85 | Temp 97.8°F | Resp 17 | Ht 65.0 in | Wt 145.1 lb

## 2018-09-10 DIAGNOSIS — Z79899 Other long term (current) drug therapy: Secondary | ICD-10-CM

## 2018-09-10 DIAGNOSIS — C50919 Malignant neoplasm of unspecified site of unspecified female breast: Secondary | ICD-10-CM

## 2018-09-10 DIAGNOSIS — C787 Secondary malignant neoplasm of liver and intrahepatic bile duct: Secondary | ICD-10-CM | POA: Diagnosis not present

## 2018-09-10 DIAGNOSIS — Z5112 Encounter for antineoplastic immunotherapy: Secondary | ICD-10-CM | POA: Insufficient documentation

## 2018-09-10 DIAGNOSIS — C7931 Secondary malignant neoplasm of brain: Secondary | ICD-10-CM | POA: Diagnosis not present

## 2018-09-10 DIAGNOSIS — Z9221 Personal history of antineoplastic chemotherapy: Secondary | ICD-10-CM | POA: Diagnosis not present

## 2018-09-10 DIAGNOSIS — Z7984 Long term (current) use of oral hypoglycemic drugs: Secondary | ICD-10-CM | POA: Insufficient documentation

## 2018-09-10 DIAGNOSIS — Z923 Personal history of irradiation: Secondary | ICD-10-CM | POA: Diagnosis not present

## 2018-09-10 DIAGNOSIS — Z170 Estrogen receptor positive status [ER+]: Secondary | ICD-10-CM | POA: Insufficient documentation

## 2018-09-10 DIAGNOSIS — Z95828 Presence of other vascular implants and grafts: Secondary | ICD-10-CM

## 2018-09-10 LAB — CBC WITH DIFFERENTIAL (CANCER CENTER ONLY)
Abs Immature Granulocytes: 0.01 10*3/uL (ref 0.00–0.07)
Basophils Absolute: 0 10*3/uL (ref 0.0–0.1)
Basophils Relative: 1 %
Eosinophils Absolute: 0.1 10*3/uL (ref 0.0–0.5)
Eosinophils Relative: 2 %
HEMATOCRIT: 38.1 % (ref 36.0–46.0)
Hemoglobin: 12.6 g/dL (ref 12.0–15.0)
Immature Granulocytes: 0 %
LYMPHS ABS: 0.7 10*3/uL (ref 0.7–4.0)
LYMPHS PCT: 22 %
MCH: 31.7 pg (ref 26.0–34.0)
MCHC: 33.1 g/dL (ref 30.0–36.0)
MCV: 96 fL (ref 80.0–100.0)
Monocytes Absolute: 0.4 10*3/uL (ref 0.1–1.0)
Monocytes Relative: 12 %
Neutro Abs: 2.1 10*3/uL (ref 1.7–7.7)
Neutrophils Relative %: 63 %
Platelet Count: 143 10*3/uL — ABNORMAL LOW (ref 150–400)
RBC: 3.97 MIL/uL (ref 3.87–5.11)
RDW: 13.6 % (ref 11.5–15.5)
WBC: 3.4 10*3/uL — AB (ref 4.0–10.5)
nRBC: 0 % (ref 0.0–0.2)

## 2018-09-10 LAB — CMP (CANCER CENTER ONLY)
ALT: 15 U/L (ref 0–44)
AST: 34 U/L (ref 15–41)
Albumin: 3.3 g/dL — ABNORMAL LOW (ref 3.5–5.0)
Alkaline Phosphatase: 66 U/L (ref 38–126)
Anion gap: 10 (ref 5–15)
BUN: 10 mg/dL (ref 8–23)
CO2: 25 mmol/L (ref 22–32)
Calcium: 9 mg/dL (ref 8.9–10.3)
Chloride: 105 mmol/L (ref 98–111)
Creatinine: 0.69 mg/dL (ref 0.44–1.00)
GFR, Est AFR Am: >60 mL/min (ref >60)
Glucose, Bld: 101 mg/dL — ABNORMAL HIGH (ref 70–99)
Potassium: 3.6 mmol/L (ref 3.5–5.1)
Sodium: 140 mmol/L (ref 135–145)
Total Bilirubin: 0.7 mg/dL (ref 0.3–1.2)
Total Protein: 6.9 g/dL (ref 6.5–8.1)

## 2018-09-10 MED ORDER — DIPHENHYDRAMINE HCL 25 MG PO CAPS
ORAL_CAPSULE | ORAL | Status: AC
  Filled 2018-09-10: qty 2

## 2018-09-10 MED ORDER — SODIUM CHLORIDE 0.9 % IV SOLN
Freq: Once | INTRAVENOUS | Status: AC
  Administered 2018-09-10: 11:00:00 via INTRAVENOUS
  Filled 2018-09-10: qty 250

## 2018-09-10 MED ORDER — SODIUM CHLORIDE 0.9% FLUSH
10.0000 mL | INTRAVENOUS | Status: DC | PRN
  Administered 2018-09-10: 10 mL
  Filled 2018-09-10: qty 10

## 2018-09-10 MED ORDER — ACETAMINOPHEN 325 MG PO TABS
650.0000 mg | ORAL_TABLET | Freq: Once | ORAL | Status: AC
  Administered 2018-09-10: 650 mg via ORAL

## 2018-09-10 MED ORDER — HEPARIN SOD (PORK) LOCK FLUSH 100 UNIT/ML IV SOLN
500.0000 [IU] | Freq: Once | INTRAVENOUS | Status: AC | PRN
  Administered 2018-09-10: 500 [IU]
  Filled 2018-09-10: qty 5

## 2018-09-10 MED ORDER — SODIUM CHLORIDE 0.9% FLUSH
10.0000 mL | INTRAVENOUS | Status: DC | PRN
  Administered 2018-09-10: 10 mL via INTRAVENOUS
  Filled 2018-09-10: qty 10

## 2018-09-10 MED ORDER — ACETAMINOPHEN 325 MG PO TABS
ORAL_TABLET | ORAL | Status: AC
  Filled 2018-09-10: qty 2

## 2018-09-10 MED ORDER — DIPHENHYDRAMINE HCL 25 MG PO CAPS
50.0000 mg | ORAL_CAPSULE | Freq: Once | ORAL | Status: AC
  Administered 2018-09-10: 50 mg via ORAL

## 2018-09-10 MED ORDER — SODIUM CHLORIDE 0.9 % IV SOLN
3.0000 mg/kg | Freq: Once | INTRAVENOUS | Status: AC
  Administered 2018-09-10: 200 mg via INTRAVENOUS
  Filled 2018-09-10: qty 10

## 2018-09-10 NOTE — Telephone Encounter (Signed)
Called to find out where Monique Figueroa was located and she was still in scheduling.  They came back shortly after. Gardiner Rhyme

## 2018-09-10 NOTE — Patient Instructions (Signed)
[facility] Discharge Instructions for Patients Receiving Chemotherapy  Today you received the following chemotherapy agents :  Kadcyla.  To help prevent nausea and vomiting after your treatment, we encourage you to take your nausea medication as prescribed.   If you develop nausea and vomiting that is not controlled by your nausea medication, call the clinic.   BELOW ARE SYMPTOMS THAT SHOULD BE REPORTED IMMEDIATELY:  *FEVER GREATER THAN 100.5 F  *CHILLS WITH OR WITHOUT FEVER  NAUSEA AND VOMITING THAT IS NOT CONTROLLED WITH YOUR NAUSEA MEDICATION  *UNUSUAL SHORTNESS OF BREATH  *UNUSUAL BRUISING OR BLEEDING  TENDERNESS IN MOUTH AND THROAT WITH OR WITHOUT PRESENCE OF ULCERS  *URINARY PROBLEMS  *BOWEL PROBLEMS  UNUSUAL RASH Items with * indicate a potential emergency and should be followed up as soon as possible.  Feel free to call the clinic should you have any questions or concerns. The clinic phone number is [phone].  Please show the CHEMO ALERT CARD at check-in to the Emergency Department and triage nurse.   

## 2018-09-10 NOTE — Progress Notes (Signed)
Nutrition follow-up completed with patient receiving treatment for metastatic breast cancer. Weight improved and documented as of 145.1 pounds February 6 increased from 144 pounds January 16. Labs were reviewed.  Potassium increased to normal from 3.0-3.6.  Noted albumin 3.3. Patient is trying to drink more water. She denies nutrition impact symptoms.  Nutrition diagnosis: Inadequate oral intake resolved.  Provided support and encouragement for patient to continue strategies for adequate calories, protein, and fluids. Encouraged her to contact me if she has questions or concerns in the future. She has my contact information.  **Disclaimer: This note was dictated with voice recognition software. Similar sounding words can inadvertently be transcribed and this note may contain transcription errors which may not have been corrected upon publication of note.**

## 2018-09-10 NOTE — Assessment & Plan Note (Signed)
Metastatic breast cancer with innumerable liver metastases detected on ultrasound and recent MRI of the liver on 04/26/2016 (Prior history of left breast IDC T1 1 N0 stage IA 0.3 cm grade 1 tumor that was ER 38%, PR 93%, Ki-67 10%, HER-2 2+, no tissue for FISH testing, 0/1 lymph node negative, status post lumpectomy radiation and 5 years of Arimidex completed in February 2013)  PET/CT scan: 05/07/2016: Extensive metastatic involvement of both lobes of the liver, primary could be liver, phalangeal or metastatic disease, hypermetabolic thoracic lymph nodes left axillary, left supraclavicular and right CP angle lymph nodes, hypermetabolic upper abdominal lymph nodes  Liver biopsy10/07/2016: Metastatic carcinoma breast primary strong positivity for CK 7 weak focal positivity for ER, GCDFP, negative for PR, CDX 2, TTF-1, WT 1; HER-2 positive  Treatment plan: 1. Taxotere Herceptin and Perjeta every 3 weeks palliative chemotherapy started 06/13/16 (Perjeta discontinued after 5 cycles due to allergy) 2. followed by Herceptin maintenance stopped 05/08/2018 due to progression ----------------------------------------------------------------------------------------------------------------------------------------- Current treatment: Kadcyla starting 10/25/2019todayiscycle 3 Patient completed palliative radiation therapy to the brain Echo on 04/21/18 shows normal EF of 55-60%  Kadcyla toxicities: No side effects to Kadcyla. Hypokalemia: started micro K  CT abdomen pelvis 08/03/2018 done for abdominal pain: 7 x 5.2 cm right hepatic lobe lesion is slightly enlarged compared to prior exam multiple other smaller low densities are noted.  Radiology review: I discussed the CT scan with the patient and provided her with a copy of this report.  The scan was done very early into Kadcyla treatment.  I would like to continue the current treatment and repeat another scan in Feb end 2020.  Return to clinic every  3 weeks for Kadcyla.  Repeat CT scan at the end of February.  Follow-up after that to discuss results.

## 2018-09-10 NOTE — Telephone Encounter (Signed)
Gave AVS, calendar, and contrast

## 2018-09-21 ENCOUNTER — Telehealth: Payer: Self-pay

## 2018-09-21 NOTE — Telephone Encounter (Signed)
Spoke with patient and confirmed appointment schedule for CT scan - Pt verbalized understanding.  Given NPO and contrast instructions.

## 2018-09-30 ENCOUNTER — Ambulatory Visit (HOSPITAL_COMMUNITY)
Admission: RE | Admit: 2018-09-30 | Discharge: 2018-09-30 | Disposition: A | Discharge status: Home / Self Care | Payer: Medicare Other | Source: Ambulatory Visit | Attending: Hematology and Oncology | Admitting: Hematology and Oncology

## 2018-09-30 ENCOUNTER — Encounter (HOSPITAL_COMMUNITY): Payer: Self-pay

## 2018-09-30 DIAGNOSIS — C787 Secondary malignant neoplasm of liver and intrahepatic bile duct: Secondary | ICD-10-CM

## 2018-09-30 DIAGNOSIS — C50919 Malignant neoplasm of unspecified site of unspecified female breast: Secondary | ICD-10-CM | POA: Diagnosis not present

## 2018-09-30 DIAGNOSIS — C50912 Malignant neoplasm of unspecified site of left female breast: Secondary | ICD-10-CM | POA: Diagnosis not present

## 2018-09-30 MED ORDER — SODIUM CHLORIDE (PF) 0.9 % IJ SOLN
INTRAMUSCULAR | Status: AC
  Filled 2018-09-30: qty 50

## 2018-09-30 MED ORDER — IOHEXOL 300 MG/ML  SOLN
100.0000 mL | Freq: Once | INTRAMUSCULAR | Status: AC | PRN
  Administered 2018-09-30: 100 mL via INTRAVENOUS

## 2018-09-30 NOTE — Progress Notes (Signed)
Patient Care Team: Monique Gravel, MD as PCP - General (Internal Medicine)  DIAGNOSIS:    ICD-10-CM   1. Metastatic breast cancer ([place]) C50.919     SUMMARY OF ONCOLOGIC HISTORY:   Metastatic breast cancer ([place])   06/05/2006 Initial Diagnosis    Left breast biopsy: DCIS with apocrine features ER 30%, PR 13%    06/19/2006 Surgery    Left lumpectomy: stage I, pT1a, pN0(i)(sn), pMX, 0.3 cm IDC, grade 1, with extensive high-grade DCIS , margins negative ER 38 %, PR 93 %, Ki-67 10%, HER-2/neu 2+, no additional tissue available for FISH testing, with 0/1 left axillary lymph nodes.    07/30/2006 - 09/17/2006 Radiation Therapy    Adjuvant radiation therapy    09/24/2006 - 09/25/2011 Anti-estrogen oral therapy    Antiestrogen therapy with Arimidex 5 years    04/26/2016 Relapse/Recurrence    Innumerable hepatic lesions seen in both lobes most are 1-2 cm size somewhat confluent largest in the posterior right hepatic dome 3 x 4.6 cm    05/07/2016 PET scan    PET/CT scan: Extensive metastatic involvement of both lobes of the liver, primary could be liver, phalangeal or metastatic disease, hypermetabolic thoracic LN left axillary, left supraclavicular and right CP angle LN, hypermetabolic upper abdominal LN    05/16/2016 Initial Biopsy    Liver biopsy: Metastatic carcinoma breast primary strong positivity for CK 7 weak focal positivity for ER, GCDFP, negative for PR, CDX 2, TTF-1, WT 1; HER-2 positive    06/13/2016 - 11/07/2016 Chemotherapy    Taxotere Herceptin and Perjeta every 3 weeks (Taxotere was held for elevated LFTs for cycles 1 and 2)    11/28/2016 - 05/06/2018 Chemotherapy    Herceptin maintenance therapy for metastatic breast cancer every 3 weeks    03/24/2018 Surgery    Chronic cholecystitis: Diagnostic laparoscopy: Due to intermittent attacks of biliary colic: The gallbladder was rockhard and not distensible with tumor palpated in the porta hepatis, extensive lucent liver lesions, decision  was made to stop the surgery.    05/12/2018 - 05/22/2018 Radiation Therapy    Radiation to the brain    05/29/2018 -  Chemotherapy    Kadcyla     CHIEF COMPLIANT: Follow-up on Kadcyla  INTERVAL HISTORY: Monique Figueroa is a 85 y.o. with above-mentioned history of metastatic breast cancer with brain metastasis currently on Kadcyla.A CT CAP from 09/30/18 has not yet been read. She presents to the clinic today with her son and is feeling well. She denies nausea or vomiting but notes one episode of diarrhea she attributes to food she ate. She notes numbness and tingling in her feet that has not worsened.  REVIEW OF SYSTEMS:   Constitutional: Denies fevers, chills or abnormal weight loss Eyes: Denies blurriness of vision Ears, nose, mouth, throat, and face: Denies mucositis or sore throat Respiratory: Denies cough, dyspnea or wheezes Cardiovascular: Denies palpitation, chest discomfort Gastrointestinal: Denies nausea, heartburn (+) diarrhea Skin: Denies abnormal skin rashes Lymphatics: Denies new lymphadenopathy or easy bruising Neurological: Denies new weaknesses (+) numbness, tingling in feet Behavioral/Psych: Mood is stable, no new changes  Extremities: No lower extremity edema Breast: denies any pain or lumps or nodules in either breasts All other systems were reviewed with the patient and are negative.  I have reviewed the past medical history, past surgical history, social history and family history with the patient and they are unchanged from previous note.  ALLERGIES:  is allergic to perjeta [pertuzumab] and sulfa antibiotics.  MEDICATIONS:  Current Outpatient Medications  Medication Sig Dispense Refill  . acetaminophen (TYLENOL) 500 MG tablet Take 500 mg by mouth 2 (two) times daily as needed for moderate pain or headache.    . benzocaine (ORAJEL) 10 % mucosal gel Use as directed 1 application in the mouth or throat as needed for mouth pain.    . calcium carbonate (TUMS -  DOSED IN MG ELEMENTAL CALCIUM) 500 MG chewable tablet Chew 1 tablet by mouth 2 (two) times daily as needed for indigestion or heartburn.     . diphenhydrAMINE (BENADRYL) 25 MG tablet Take 25 mg by mouth daily as needed for allergies.    . hydrochlorothiazide (HYDRODIURIL) 25 MG tablet Take 25 mg by mouth daily.    Marland Kitchen levothyroxine (SYNTHROID, LEVOTHROID) 125 MCG tablet Take 125 mcg by mouth daily before breakfast.     . Liniments (BLUE-EMU SUPER STRENGTH EX) Apply 1 application topically daily as needed (muscle pain).    . metFORMIN (GLUCOPHAGE) 500 MG tablet Take 500 mg by mouth daily with breakfast.     . omeprazole (PRILOSEC OTC) 20 MG tablet Take 20 mg by mouth daily as needed (acid reflux).     . ondansetron (ZOFRAN-ODT) 8 MG disintegrating tablet TAKE 1 TABLET (8 MG TOTAL) BY MOUTH EVERY 8 (EIGHT) HOURS AS NEEDED FOR NAUSEA OR VOMITING. 20 tablet 1  . oxyCODONE-acetaminophen (PERCOCET/ROXICET) 5-325 MG tablet Take 1 tablet by mouth daily as needed for severe pain.    Marland Kitchen Phenazopyridine HCl (AZO-STANDARD PO) Take 1 tablet by mouth 2 (two) times daily as needed (uti symptoms).    . potassium chloride (MICRO-K) 10 MEQ CR capsule Take 1 capsule (10 mEq total) by mouth 2 (two) times daily. 30 capsule 1  . potassium chloride SA (K-DUR,KLOR-CON) 20 MEQ tablet Take 1 tablet (20 mEq total) by mouth daily. (Patient not taking: Reported on 09/07/2018) 30 tablet 0  . prochlorperazine (COMPAZINE) 10 MG tablet Take 1 tablet (10 mg total) by mouth every 6 (six) hours as needed for nausea or vomiting. 30 tablet 6  . Propylene Glycol (SYSTANE COMPLETE) 0.6 % SOLN Place 1 drop into both eyes 2 (two) times daily.    . sodium chloride (OCEAN) 0.65 % SOLN nasal spray Place 1 spray into both nostrils as needed for congestion.     No current facility-administered medications for this visit.    Facility-Administered Medications Ordered in Other Visits  Medication Dose Route Frequency Provider Last Rate Last Dose  .  sodium chloride flush (NS) 0.9 % injection 10 mL  10 mL Intravenous PRN Nicholas Lose, MD   10 mL at 10/01/18 9563    PHYSICAL EXAMINATION: ECOG PERFORMANCE STATUS: 1 - Symptomatic but completely ambulatory  Vitals:   10/01/18 0946  BP: (!) 142/70  Pulse: 72  Resp: 16  Temp: 97.8 F (36.6 C)  SpO2: 97%   Filed Weights   10/01/18 0946  Weight: 144 lb 4.8 oz (65.5 kg)    GENERAL: alert, no distress and comfortable SKIN: skin color, texture, turgor are normal, no rashes or significant lesions EYES: normal, Conjunctiva are pink and non-injected, sclera clear OROPHARYNX: no exudate, no erythema and lips, buccal mucosa, and tongue normal  NECK: supple, thyroid normal size, non-tender, without nodularity LYMPH: no palpable lymphadenopathy in the cervical, axillary or inguinal LUNGS: clear to auscultation and percussion with normal breathing effort HEART: regular rate & rhythm and no murmurs and no lower extremity edema ABDOMEN: abdomen soft, non-tender and normal bowel sounds MUSCULOSKELETAL: no cyanosis of  digits and no clubbing  NEURO: alert & oriented x 3 with fluent speech, no focal motor/sensory deficits EXTREMITIES: No lower extremity edema  LABORATORY DATA:  I have reviewed the data as listed CMP Latest Ref Rng & Units 10/01/2018 09/10/2018 08/20/2018  Glucose 70 - 99 mg/dL 104(H) 101(H) 93  BUN 8 - 23 mg/dL '8 10 12  '$ Creatinine 0.44 - 1.00 mg/dL 0.73 0.69 0.67  Sodium 135 - 145 mmol/L 141 140 139  Potassium 3.5 - 5.1 mmol/L 3.2(L) 3.6 3.0(LL)  Chloride 98 - 111 mmol/L 104 105 101  CO2 22 - 32 mmol/L '27 25 28  '$ Calcium 8.9 - 10.3 mg/dL 8.9 9.0 9.5  Total Protein 6.5 - 8.1 g/dL 6.9 6.9 7.1  Total Bilirubin 0.3 - 1.2 mg/dL 0.6 0.7 1.0  Alkaline Phos 38 - 126 U/L 65 66 58  AST 15 - 41 U/L 36 34 32  ALT 0 - 44 U/L '14 15 14    '$ Lab Results  Component Value Date   WBC 3.3 (L) 10/01/2018   HGB 12.6 10/01/2018   HCT 38.8 10/01/2018   MCV 92.8 10/01/2018   PLT 142 (L)  10/01/2018   NEUTROABS 2.1 10/01/2018    ASSESSMENT & PLAN:  Metastatic breast cancer ([facility]) Metastatic breast cancer with innumerable liver metastases detected on ultrasound and recent MRI of the liver on 04/26/2016 (Prior history of left breast IDC T1 1 N0 stage IA 0.3 cm grade 1 tumor that was ER 38%, PR 93%, Ki-67 10%, HER-2 2+, no tissue for FISH testing, 0/1 lymph node negative, status post lumpectomy radiation and 5 years of Arimidex completed in February 2013)  PET/CT scan: 05/07/2016: Extensive metastatic involvement of both lobes of the liver, primary could be liver, phalangeal or metastatic disease, hypermetabolic thoracic lymph nodes left axillary, left supraclavicular and right CP angle lymph nodes, hypermetabolic upper abdominal lymph nodes  Liver biopsy10/07/2016: Metastatic carcinoma breast primary strong positivity for CK 7 weak focal positivity for ER, GCDFP, negative for PR, CDX 2, TTF-1, WT 1; HER-2 positive  Treatment plan: 1. Taxotere Herceptin and Perjeta every 3 weeks palliative chemotherapy started 06/13/16 (Perjeta discontinued after 5 cycles due to allergy) 2. followed by Herceptin maintenance stopped 05/08/2018 due to progression ----------------------------------------------------------------------------------------------------------------------------------------- Current treatment: Kadcyla starting 10/25/2019todayiscycle 4 Patient completed palliative radiation therapy to the brain Echo on 04/21/18 shows normal EF of 55-60%  Kadcyla toxicities: No side effects to Kadcyla. Hypokalemia: micro K  CT CAP 09/30/2018: To my review the liver metastases appears to be slightly smaller than before.  We are waiting for the official read on the scan.  Based on this we will continue with current treatment plan. I will call the patient with the results of the CT scans after the official read.   No orders of the defined types were placed in this encounter.  The  patient has a good understanding of the overall plan. she agrees with it. she will call with any problems that may develop before the next visit here.  Nicholas Lose, MD 10/01/2018  Julious Oka Dorshimer am acting as scribe for Dr. Nicholas Lose.  I have reviewed the above documentation for accuracy and completeness, and I agree with the above.

## 2018-10-01 ENCOUNTER — Inpatient Hospital Stay: Payer: Medicare Other | Admitting: Hematology and Oncology

## 2018-10-01 ENCOUNTER — Inpatient Hospital Stay: Payer: Medicare Other

## 2018-10-01 DIAGNOSIS — Z923 Personal history of irradiation: Secondary | ICD-10-CM

## 2018-10-01 DIAGNOSIS — Z9221 Personal history of antineoplastic chemotherapy: Secondary | ICD-10-CM

## 2018-10-01 DIAGNOSIS — C50919 Malignant neoplasm of unspecified site of unspecified female breast: Secondary | ICD-10-CM

## 2018-10-01 DIAGNOSIS — C7931 Secondary malignant neoplasm of brain: Secondary | ICD-10-CM | POA: Diagnosis not present

## 2018-10-01 DIAGNOSIS — Z79899 Other long term (current) drug therapy: Secondary | ICD-10-CM

## 2018-10-01 DIAGNOSIS — Z5112 Encounter for antineoplastic immunotherapy: Secondary | ICD-10-CM | POA: Diagnosis not present

## 2018-10-01 DIAGNOSIS — Z95828 Presence of other vascular implants and grafts: Secondary | ICD-10-CM

## 2018-10-01 DIAGNOSIS — Z170 Estrogen receptor positive status [ER+]: Secondary | ICD-10-CM | POA: Diagnosis not present

## 2018-10-01 DIAGNOSIS — C787 Secondary malignant neoplasm of liver and intrahepatic bile duct: Secondary | ICD-10-CM

## 2018-10-01 DIAGNOSIS — Z7984 Long term (current) use of oral hypoglycemic drugs: Secondary | ICD-10-CM | POA: Diagnosis not present

## 2018-10-01 LAB — CBC WITH DIFFERENTIAL (CANCER CENTER ONLY)
Abs Immature Granulocytes: 0 10*3/uL (ref 0.00–0.07)
Basophils Absolute: 0 10*3/uL (ref 0.0–0.1)
Basophils Relative: 1 %
Eosinophils Absolute: 0 10*3/uL (ref 0.0–0.5)
Eosinophils Relative: 1 %
HCT: 38.8 % (ref 36.0–46.0)
HEMOGLOBIN: 12.6 g/dL (ref 12.0–15.0)
IMMATURE GRANULOCYTES: 0 %
Lymphocytes Relative: 22 %
Lymphs Abs: 0.7 10*3/uL (ref 0.7–4.0)
MCH: 30.1 pg (ref 26.0–34.0)
MCHC: 32.5 g/dL (ref 30.0–36.0)
MCV: 92.8 fL (ref 80.0–100.0)
Monocytes Absolute: 0.5 10*3/uL (ref 0.1–1.0)
Monocytes Relative: 14 %
Neutro Abs: 2.1 10*3/uL (ref 1.7–7.7)
Neutrophils Relative %: 62 %
Platelet Count: 142 10*3/uL — ABNORMAL LOW (ref 150–400)
RBC: 4.18 MIL/uL (ref 3.87–5.11)
RDW: 13.7 % (ref 11.5–15.5)
WBC Count: 3.3 10*3/uL — ABNORMAL LOW (ref 4.0–10.5)
nRBC: 0 % (ref 0.0–0.2)

## 2018-10-01 LAB — CMP (CANCER CENTER ONLY)
ALT: 14 U/L (ref 0–44)
AST: 36 U/L (ref 15–41)
Albumin: 3.4 g/dL — ABNORMAL LOW (ref 3.5–5.0)
Alkaline Phosphatase: 65 U/L (ref 38–126)
Anion gap: 10 (ref 5–15)
BUN: 8 mg/dL (ref 8–23)
CO2: 27 mmol/L (ref 22–32)
Calcium: 8.9 mg/dL (ref 8.9–10.3)
Chloride: 104 mmol/L (ref 98–111)
Creatinine: 0.73 mg/dL (ref 0.44–1.00)
GFR, Est AFR Am: >60 mL/min (ref >60)
GFR, Estimated: >60 mL/min (ref >60)
GLUCOSE: 104 mg/dL — AB (ref 70–99)
Potassium: 3.2 mmol/L — ABNORMAL LOW (ref 3.5–5.1)
SODIUM: 141 mmol/L (ref 135–145)
Total Bilirubin: 0.6 mg/dL (ref 0.3–1.2)
Total Protein: 6.9 g/dL (ref 6.5–8.1)

## 2018-10-01 MED ORDER — HEPARIN SOD (PORK) LOCK FLUSH 100 UNIT/ML IV SOLN
500.0000 [IU] | Freq: Once | INTRAVENOUS | Status: AC | PRN
  Administered 2018-10-01: 500 [IU]
  Filled 2018-10-01: qty 5

## 2018-10-01 MED ORDER — DIPHENHYDRAMINE HCL 25 MG PO CAPS
ORAL_CAPSULE | ORAL | Status: AC
  Filled 2018-10-01: qty 2

## 2018-10-01 MED ORDER — SODIUM CHLORIDE 0.9% FLUSH
10.0000 mL | INTRAVENOUS | Status: DC | PRN
  Administered 2018-10-01: 10 mL
  Filled 2018-10-01: qty 10

## 2018-10-01 MED ORDER — DIPHENHYDRAMINE HCL 25 MG PO CAPS
50.0000 mg | ORAL_CAPSULE | Freq: Once | ORAL | Status: AC
  Administered 2018-10-01: 50 mg via ORAL

## 2018-10-01 MED ORDER — SODIUM CHLORIDE 0.9% FLUSH
10.0000 mL | INTRAVENOUS | Status: DC | PRN
  Administered 2018-10-01: 10 mL via INTRAVENOUS
  Filled 2018-10-01: qty 10

## 2018-10-01 MED ORDER — SODIUM CHLORIDE 0.9 % IV SOLN
3.0000 mg/kg | Freq: Once | INTRAVENOUS | Status: AC
  Administered 2018-10-01: 200 mg via INTRAVENOUS
  Filled 2018-10-01: qty 10

## 2018-10-01 MED ORDER — ACETAMINOPHEN 325 MG PO TABS
ORAL_TABLET | ORAL | Status: AC
  Filled 2018-10-01: qty 2

## 2018-10-01 MED ORDER — ACETAMINOPHEN 325 MG PO TABS
650.0000 mg | ORAL_TABLET | Freq: Once | ORAL | Status: AC
  Administered 2018-10-01: 650 mg via ORAL

## 2018-10-01 MED ORDER — SODIUM CHLORIDE 0.9 % IV SOLN
Freq: Once | INTRAVENOUS | Status: AC
  Administered 2018-10-01: 11:00:00 via INTRAVENOUS
  Filled 2018-10-01: qty 250

## 2018-10-01 NOTE — Assessment & Plan Note (Signed)
Metastatic breast cancer with innumerable liver metastases detected on ultrasound and recent MRI of the liver on 04/26/2016 (Prior history of left breast IDC T1 1 N0 stage IA 0.3 cm grade 1 tumor that was ER 38%, PR 93%, Ki-67 10%, HER-2 2+, no tissue for FISH testing, 0/1 lymph node negative, status post lumpectomy radiation and 5 years of Arimidex completed in February 2013)  PET/CT scan: 05/07/2016: Extensive metastatic involvement of both lobes of the liver, primary could be liver, phalangeal or metastatic disease, hypermetabolic thoracic lymph nodes left axillary, left supraclavicular and right CP angle lymph nodes, hypermetabolic upper abdominal lymph nodes  Liver biopsy10/07/2016: Metastatic carcinoma breast primary strong positivity for CK 7 weak focal positivity for ER, GCDFP, negative for PR, CDX 2, TTF-1, WT 1; HER-2 positive  Treatment plan: 1. Taxotere Herceptin and Perjeta every 3 weeks palliative chemotherapy started 06/13/16 (Perjeta discontinued after 5 cycles due to allergy) 2. followed by Herceptin maintenance stopped 05/08/2018 due to progression ----------------------------------------------------------------------------------------------------------------------------------------- Current treatment: Kadcyla starting 10/25/2019todayiscycle 4 Patient completed palliative radiation therapy to the brain Echo on 04/21/18 shows normal EF of 55-60%  Kadcyla toxicities: No side effects to Kadcyla. Hypokalemia: micro K  CT CAP 09/30/2018:

## 2018-10-01 NOTE — Patient Instructions (Signed)
[facility] Discharge Instructions for Patients Receiving Chemotherapy  Today you received the following chemotherapy agents Kadcyla  To help prevent nausea and vomiting after your treatment, we encourage you to take your nausea medication as directed   If you develop nausea and vomiting that is not controlled by your nausea medication, call the clinic.   BELOW ARE SYMPTOMS THAT SHOULD BE REPORTED IMMEDIATELY:  *FEVER GREATER THAN 100.5 F  *CHILLS WITH OR WITHOUT FEVER  NAUSEA AND VOMITING THAT IS NOT CONTROLLED WITH YOUR NAUSEA MEDICATION  *UNUSUAL SHORTNESS OF BREATH  *UNUSUAL BRUISING OR BLEEDING  TENDERNESS IN MOUTH AND THROAT WITH OR WITHOUT PRESENCE OF ULCERS  *URINARY PROBLEMS  *BOWEL PROBLEMS  UNUSUAL RASH Items with * indicate a potential emergency and should be followed up as soon as possible.  Feel free to call the clinic should you have any questions or concerns. The clinic phone number is [phone].  Please show the CHEMO ALERT CARD at check-in to the Emergency Department and triage nurse.   

## 2018-10-14 ENCOUNTER — Ambulatory Visit (HOSPITAL_COMMUNITY): Payer: Medicare Other

## 2018-10-14 ENCOUNTER — Ambulatory Visit (HOSPITAL_COMMUNITY): Payer: Medicare Other | Admitting: Certified Registered"

## 2018-10-14 ENCOUNTER — Encounter (HOSPITAL_COMMUNITY)
Admission: RE | Disposition: A | Discharge status: Home / Self Care | Payer: Self-pay | Source: Home / Self Care | Attending: Gastroenterology

## 2018-10-14 ENCOUNTER — Other Ambulatory Visit: Payer: Self-pay

## 2018-10-14 ENCOUNTER — Encounter (HOSPITAL_COMMUNITY): Payer: Self-pay | Admitting: *Deleted

## 2018-10-14 ENCOUNTER — Ambulatory Visit (HOSPITAL_COMMUNITY)
Admission: RE | Admit: 2018-10-14 | Discharge: 2018-10-14 | Disposition: A | Discharge status: Home / Self Care | Payer: Medicare Other | Attending: Gastroenterology | Admitting: Gastroenterology

## 2018-10-14 DIAGNOSIS — K2980 Duodenitis without bleeding: Secondary | ICD-10-CM | POA: Diagnosis not present

## 2018-10-14 DIAGNOSIS — I10 Essential (primary) hypertension: Secondary | ICD-10-CM | POA: Diagnosis not present

## 2018-10-14 DIAGNOSIS — Z4659 Encounter for fitting and adjustment of other gastrointestinal appliance and device: Secondary | ICD-10-CM | POA: Insufficient documentation

## 2018-10-14 DIAGNOSIS — Z7984 Long term (current) use of oral hypoglycemic drugs: Secondary | ICD-10-CM | POA: Diagnosis not present

## 2018-10-14 DIAGNOSIS — K269 Duodenal ulcer, unspecified as acute or chronic, without hemorrhage or perforation: Secondary | ICD-10-CM | POA: Insufficient documentation

## 2018-10-14 DIAGNOSIS — K8020 Calculus of gallbladder without cholecystitis without obstruction: Secondary | ICD-10-CM

## 2018-10-14 DIAGNOSIS — K831 Obstruction of bile duct: Secondary | ICD-10-CM | POA: Diagnosis not present

## 2018-10-14 DIAGNOSIS — K219 Gastro-esophageal reflux disease without esophagitis: Secondary | ICD-10-CM | POA: Diagnosis not present

## 2018-10-14 DIAGNOSIS — K2950 Unspecified chronic gastritis without bleeding: Secondary | ICD-10-CM | POA: Insufficient documentation

## 2018-10-14 DIAGNOSIS — K838 Other specified diseases of biliary tract: Secondary | ICD-10-CM | POA: Diagnosis not present

## 2018-10-14 DIAGNOSIS — E119 Type 2 diabetes mellitus without complications: Secondary | ICD-10-CM | POA: Diagnosis not present

## 2018-10-14 DIAGNOSIS — Z85850 Personal history of malignant neoplasm of thyroid: Secondary | ICD-10-CM | POA: Diagnosis not present

## 2018-10-14 DIAGNOSIS — K3189 Other diseases of stomach and duodenum: Secondary | ICD-10-CM

## 2018-10-14 DIAGNOSIS — Z853 Personal history of malignant neoplasm of breast: Secondary | ICD-10-CM | POA: Insufficient documentation

## 2018-10-14 DIAGNOSIS — Z79899 Other long term (current) drug therapy: Secondary | ICD-10-CM | POA: Insufficient documentation

## 2018-10-14 DIAGNOSIS — Z9689 Presence of other specified functional implants: Secondary | ICD-10-CM | POA: Diagnosis not present

## 2018-10-14 DIAGNOSIS — R1011 Right upper quadrant pain: Secondary | ICD-10-CM

## 2018-10-14 HISTORY — PX: STENT REMOVAL: SHX6421

## 2018-10-14 HISTORY — PX: BIOPSY: SHX5522

## 2018-10-14 HISTORY — PX: ENDOSCOPIC RETROGRADE CHOLANGIOPANCREATOGRAPHY (ERCP) WITH PROPOFOL: SHX5810

## 2018-10-14 HISTORY — PX: REMOVAL OF STONES: SHX5545

## 2018-10-14 HISTORY — PX: BILIARY STENT PLACEMENT: SHX5538

## 2018-10-14 HISTORY — PX: BILIARY DILATION: SHX6850

## 2018-10-14 LAB — GLUCOSE, CAPILLARY
Glucose-Capillary: 82 mg/dL (ref 70–99)
Glucose-Capillary: 160 mg/dL — ABNORMAL HIGH (ref 70–99)

## 2018-10-14 SURGERY — ENDOSCOPIC RETROGRADE CHOLANGIOPANCREATOGRAPHY (ERCP) WITH PROPOFOL
Anesthesia: General

## 2018-10-14 MED ORDER — INDOMETHACIN 50 MG RE SUPP
RECTAL | Status: AC
  Filled 2018-10-14: qty 1

## 2018-10-14 MED ORDER — IOPAMIDOL (ISOVUE-300) INJECTION 61%
INTRAVENOUS | Status: DC | PRN
  Administered 2018-10-14: 50 mL

## 2018-10-14 MED ORDER — CIPROFLOXACIN IN D5W 400 MG/200ML IV SOLN
INTRAVENOUS | Status: AC
  Filled 2018-10-14: qty 200

## 2018-10-14 MED ORDER — SUGAMMADEX SODIUM 200 MG/2ML IV SOLN
INTRAVENOUS | Status: DC | PRN
  Administered 2018-10-14: 200 mg via INTRAVENOUS

## 2018-10-14 MED ORDER — INDOMETHACIN 50 MG RE SUPP
RECTAL | Status: DC | PRN
  Administered 2018-10-14: 100 mg via RECTAL

## 2018-10-14 MED ORDER — CIPROFLOXACIN HCL 500 MG PO TABS: 500.0000 mg | ORAL_TABLET | Freq: Two times a day (BID) | ORAL | 0 refills | Status: AC

## 2018-10-14 MED ORDER — PROPOFOL 10 MG/ML IV BOLUS
INTRAVENOUS | Status: AC
  Filled 2018-10-14: qty 20

## 2018-10-14 MED ORDER — DEXAMETHASONE SODIUM PHOSPHATE 10 MG/ML IJ SOLN
INTRAMUSCULAR | Status: DC | PRN
  Administered 2018-10-14: 4 mg via INTRAVENOUS

## 2018-10-14 MED ORDER — PROPOFOL 10 MG/ML IV BOLUS
INTRAVENOUS | Status: DC | PRN
  Administered 2018-10-14: 90 mg via INTRAVENOUS

## 2018-10-14 MED ORDER — GLUCAGON HCL RDNA (DIAGNOSTIC) 1 MG IJ SOLR
INTRAMUSCULAR | Status: DC | PRN
  Administered 2018-10-14 (×2): 0.25 mg via INTRAVENOUS

## 2018-10-14 MED ORDER — SODIUM CHLORIDE 0.9 % IV SOLN: INTRAVENOUS | Status: DC

## 2018-10-14 MED ORDER — LACTATED RINGERS IV SOLN
INTRAVENOUS | Status: DC | PRN
  Administered 2018-10-14: 10:00:00 via INTRAVENOUS

## 2018-10-14 MED ORDER — LIDOCAINE 2% (20 MG/ML) 5 ML SYRINGE
INTRAMUSCULAR | Status: DC | PRN
  Administered 2018-10-14: 40 mg via INTRAVENOUS

## 2018-10-14 MED ORDER — ROCURONIUM BROMIDE 10 MG/ML (PF) SYRINGE
PREFILLED_SYRINGE | INTRAVENOUS | Status: DC | PRN
  Administered 2018-10-14: 40 mg via INTRAVENOUS

## 2018-10-14 MED ORDER — FENTANYL CITRATE (PF) 100 MCG/2ML IJ SOLN
INTRAMUSCULAR | Status: AC
  Filled 2018-10-14: qty 2

## 2018-10-14 MED ORDER — FENTANYL CITRATE (PF) 250 MCG/5ML IJ SOLN
INTRAMUSCULAR | Status: DC | PRN
  Administered 2018-10-14 (×2): 25 ug via INTRAVENOUS

## 2018-10-14 MED ORDER — ONDANSETRON HCL 4 MG/2ML IJ SOLN
INTRAMUSCULAR | Status: DC | PRN
  Administered 2018-10-14: 4 mg via INTRAVENOUS

## 2018-10-14 MED ORDER — CIPROFLOXACIN IN D5W 400 MG/200ML IV SOLN
INTRAVENOUS | Status: DC | PRN
  Administered 2018-10-14: 400 mg via INTRAVENOUS

## 2018-10-14 NOTE — Anesthesia Procedure Notes (Signed)
Procedure Name: Intubation Date/Time: 10/14/2018 10:28 AM Performed by: Niel Hummer, CRNA Pre-anesthesia Checklist: Patient being monitored, Suction available, Emergency Drugs available, Patient identified and Timeout performed Patient Re-evaluated:Patient Re-evaluated prior to induction Oxygen Delivery Method: Circle system utilized Preoxygenation: Pre-oxygenation with 100% oxygen Induction Type: IV induction Ventilation: Mask ventilation without difficulty Laryngoscope Size: Mac and 4 Grade View: Grade I Tube type: Oral Tube size: 7.0 mm Number of attempts: 1 Airway Equipment and Method: Stylet Placement Confirmation: breath sounds checked- equal and bilateral,  positive ETCO2 and ETT inserted through vocal cords under direct vision Secured at: 22 cm Tube secured with: Tape Dental Injury: Teeth and Oropharynx as per pre-operative assessment

## 2018-10-14 NOTE — Discharge Instructions (Signed)
Upper Endoscopy, Adult Upper endoscopy is a procedure to look inside the upper GI (gastrointestinal) tract. The upper GI tract is made up of:  The part of the body that moves food from your mouth to your stomach (esophagus).  The stomach.  The first part of your small intestine (duodenum). This procedure is also called esophagogastroduodenoscopy (EGD) or gastroscopy. In this procedure, your health care provider passes a thin, flexible tube (endoscope) through your mouth and down your esophagus into your stomach. A small camera is attached to the end of the tube. Images from the camera appear on a monitor in the exam room. During this procedure, your health care provider may also remove a small piece of tissue to be sent to a lab and examined under a microscope (biopsy). Your health care provider may do an upper endoscopy to diagnose cancers of the upper GI tract. You may also have this procedure to find the cause of other conditions, such as:  Stomach pain.  Heartburn.  Pain or problems when swallowing.  Nausea and vomiting.  Stomach bleeding.  Stomach ulcers. Tell a health care provider about:  Any allergies you have.  All medicines you are taking, including vitamins, herbs, eye drops, creams, and over-the-counter medicines.  Any problems you or family members have had with anesthetic medicines.  Any blood disorders you have.  Any surgeries you have had.  Any medical conditions you have.  Whether you are pregnant or may be pregnant. What are the risks? Generally, this is a safe procedure. However, problems may occur, including:  Infection.  Bleeding.  Allergic reactions to medicines.  A tear or hole (perforation) in the esophagus, stomach, or duodenum. What happens before the procedure? Staying hydrated Follow instructions from your health care provider about hydration, which may include:  Up to 2 hours before the procedure - you may continue to drink clear  liquids, such as water, clear fruit juice, black coffee, and plain tea.  Eating and drinking restrictions Follow instructions from your health care provider about eating and drinking, which may include:  8 hours before the procedure - stop eating heavy meals or foods, such as meat, fried foods, or fatty foods.  6 hours before the procedure - stop eating light meals or foods, such as toast or cereal.  6 hours before the procedure - stop drinking milk or drinks that contain milk.  2 hours before the procedure - stop drinking clear liquids. Medicines Ask your health care provider about:  Changing or stopping your regular medicines. This is especially important if you are taking diabetes medicines or blood thinners.  Taking medicines such as aspirin and ibuprofen. These medicines can thin your blood. Do not take these medicines unless your health care provider tells you to take them.  Taking over-the-counter medicines, vitamins, herbs, and supplements. General instructions  Plan to have someone take you home from the hospital or clinic.  If you will be going home right after the procedure, plan to have someone with you for 24 hours.  Ask your health care provider what steps will be taken to help prevent infection. What happens during the procedure?   An IV will be inserted into one of your veins.  You may be given one or more of the following: ? A medicine to help you relax (sedative). ? A medicine to numb the throat (local anesthetic).  You will lie on your left side on an exam table.  Your health care provider will pass the endoscope through  your mouth and down your esophagus. °· Your health care provider will use the scope to check the inside of your esophagus, stomach, and duodenum. Biopsies may be taken. °· The endoscope will be removed. °The procedure may vary among health care providers and hospitals. °What happens after the procedure? °· Your blood pressure, heart rate,  breathing rate, and blood oxygen level will be monitored until you leave the hospital or clinic. °· Do not drive for 24 hours if you were given a sedative during your procedure. °· When your throat is no longer numb, you may be given some fluids to drink. °· It is up to you to get the results of your procedure. Ask your health care provider, or the department that is doing the procedure, when your results will be ready. °Summary °· Upper endoscopy is a procedure to look inside the upper GI tract. °· During the procedure, an IV will be inserted into one of your veins. You may be given a medicine to help you relax. °· A medicine will be used to numb your throat. °· The endoscope will be passed through your mouth and down your esophagus. °This information is not intended to replace advice given to you by your health care provider. Make sure you discuss any questions you have with your health care provider. °Document Released: 07/19/2000 Document Revised: 12/22/2017 Document Reviewed: 12/22/2017 °Elsevier Interactive Patient Education © 2019 [employer] ° °

## 2018-10-14 NOTE — Transfer of Care (Signed)
Immediate Anesthesia Transfer of Care Note  Patient: Monique Figueroa  Procedure(s) Performed: ENDOSCOPIC RETROGRADE CHOLANGIOPANCREATOGRAPHY (ERCP) WITH PROPOFOL (N/A )  Patient Location: PACU  Anesthesia Type:General  Level of Consciousness: awake, alert  and oriented  Airway & Oxygen Therapy: Patient Spontanous Breathing and Patient connected to face mask oxygen  Post-op Assessment: Report given to RN and Post -op Vital signs reviewed and stable  Post vital signs: Reviewed and stable  Last Vitals:  Vitals Value Taken Time  BP    Temp    Pulse    Resp    SpO2      Last Pain:  Vitals:   10/14/18 0854  TempSrc: Oral  PainSc: 0-No pain         Complications: No apparent anesthesia complications

## 2018-10-14 NOTE — Op Note (Signed)
[facility] Patient Name: Monique Figueroa Procedure Date: 10/14/2018 MRN: [id number] Attending MD: Justice Britain , MD Date of Birth: Dec 25, 1933 CSN: [id number] Age: 85 Admit Type: Outpatient Procedure:                ERCP Indications:              Malignant Bismuth type I stricture (limited to the                            common hepatic duct distal to the confluence of the                            right and left hepatic ducts), Stent change Providers:                Justice Britain, MD, Carlyn Reichert, RN, Elspeth Cho Tech., Technician, Leandro Reasoner, CRNA Referring MD:              Medicines:                Monitored Anesthesia Care, General Anesthesia,                            Cipro 400 mg IV, Indomethacin 741 mg PR Complications:            No immediate complications. Estimated Blood Loss:     Estimated blood loss was minimal. Procedure:                Pre-Anesthesia Assessment:                           - Prior to the procedure, a History and Physical                            was performed, and patient medications and                            allergies were reviewed. The patient's tolerance of                            previous anesthesia was also reviewed. The risks                            and benefits of the procedure and the sedation                            options and risks were discussed with the patient.                            All questions were answered, and informed consent                            was obtained. Prior Anticoagulants: The patient has  taken no previous anticoagulant or antiplatelet                            agents. ASA Grade Assessment: III - A patient with                            severe systemic disease. After reviewing the risks                            and benefits, the patient was deemed in                            satisfactory condition to  undergo the procedure.                            Fluoroscopy imaging on CANOPY.                           After obtaining informed consent, the scope was                            passed under direct vision. Throughout the                            procedure, the patient's blood pressure, pulse, and                            oxygen saturations were monitored continuously. The                            TJF-Q180V ([id number] Olympus duodenoscope was                            introduced through the mouth, and used to inject                            contrast into and used to inject contrast into the                            dorsal and ventral pancreatic ducts. The ERCP was                            accomplished without difficulty. The patient                            tolerated the procedure. Scope In: Scope Out: Findings:      A biliary stent was visible on the scout film.      The upper GI tract was traversed under direct vision without detailed       examination. Patchy moderately erythematous mucosa without bleeding was       found in the cardia, in the gastric fundus and in the gastric body. No       other gross lesions were noted in the gastric antrum. Biopsies were  taken on the greater curvature of the stomach, on the lesser curvature       of the stomach, at the incisura and in the gastric antrum through the       ERCP scope with the cold forceps for histology and H. pylori evaluation.       A biliary sphincterotomy had been performed. The sphincterotomy appeared       open. One plastic biliary stent originating in the biliary tree was       emerging from the major papilla. The stent was visibly patent. The       ampullary region was congested with some polypoid appearing tissue       surrounding the ampullary orifice - query inflammatory from stent vs       other. One stent was removed from the biliary tree using a snare.       Biopsies were taken from the ampulla  through the ERCP scope with the       cold forceps for histology purposes.      A short 0.035 inch Soft Jagwire was passed into the biliary tree. The       Autotome sphincterotome was passed over the guidewire and the bile duct       was then deeply cannulated. Contrast was injected. I personally       interpreted the bile duct images. Ductal flow of contrast was adequate.       Image quality was adequate. Contrast extended to the hepatic ducts.       Opacification of the entire biliary tree except for the cystic duct and       gallbladder was successful. The maximum diameter of the ducts was 7 mm.       The common hepatic duct contained a single mild localized stenosis 5 mm       in length - the cholangiogram is much improved from previous stenosis       findings. To discover objects, the biliary tree was swept with a       retrieval balloon starting at the bifurcation. Sludge was swept from the       duct. An occlusion cholangiogram was performed that showed the above.       Dilation of the upper third of the main bile duct in the region of the       stricture with a Hurricane 6 mm balloon dilator was successful. One 10       Fr by 12 cm transpapillary plastic biliary stent with a single external       flap and a single internal flap was placed into the right hepatic duct.       Bile flowed through the stent. The stent was positioned too far       downstream (towards the lumen of the GI tract) and could not be       positioned higher into the right hepatic duct to ensure complete       traversal of the CHD stricture. Thus we proceeded with removing the       stent from the biliary tree using a Raptor grasping device. We replaced       the wire in the biliary tree and then proceeded with repeat stenting.       One 7 Fr by 12 cm transpapillary plastic biliary stent with a single       external flap and a single internal flap was placed into the right  hepatic duct. Bile flowed through  the stent. The stent was in good       position.      A pancreatogram was not performed.      The duodenoscope was withdrawn from the patient. Impression:               - Erythematous mucosa in the cardia, gastric fundus                            and gastric body. Biopsied for HP evaluation.                           - Prior biliary sphincterotomy appeared open.                           - One visibly patent stent from the biliary tree                            was seen in the major papilla. THis was removed                            from the patient.                           - The major papilla appeared congested and had                            granular polypoid tissue present - query                            inflammatory vs other. Biopsied.                           - A single mild localized biliary stricture was                            found in the common hepatic duct. The stricture was                            malignant appearing.                           - Cholangiogram however is improved significantly                            from previous.                           - The biliary tree was swept and sludge was found.                           - The upper third of the main bile duct was                            successfully dilated.                           -  One plastic biliary stent was placed into the                            right hepatic duct to traverse the stenosis. Moderate Sedation:      Not Applicable - Patient had care per Anesthesia. Recommendation:           - The patient will be observed post-procedure,                            until all discharge criteria are met.                           - Discharge patient to home.                           - Patient has a contact number available for                            emergencies. The signs and symptoms of potential                            delayed complications were discussed with the                             patient. Return to normal activities tomorrow.                            Written discharge instructions were provided to the                            patient.                           - Watch for pancreatitis, bleeding, perforation,                            and cholangitis.                           - Ciprofloxacin 500 mg BID x 3-days to decrease                            risk of post-infectious ERCP complications.                           - Observe patient's clinical course.                           - Check liver enzymes (AST, ALT, alkaline                            phosphatase, bilirubin) in 1 week.                           - Observe patient's clinical course.                           -  Repeat ERCP in 6 months to exchange stent.                           - The findings and recommendations were discussed                            with the patient.                           - The findings and recommendations were discussed                            with the patient's family. Procedure Code(s):        --- Professional ---                           [phone], Endoscopic retrograde                            cholangiopancreatography (ERCP); with removal and                            exchange of stent(s), biliary or pancreatic duct,                            including pre- and post-dilation and guide wire                            passage, when performed, including sphincterotomy,                            when performed, each stent exchanged                           43276, 7, Endoscopic retrograde                            cholangiopancreatography (ERCP); with removal and                            exchange of stent(s), biliary or pancreatic duct,                            including pre- and post-dilation and guide wire                            passage, when performed, including sphincterotomy,                            when performed, each stent  exchanged                           43264, Endoscopic retrograde                            cholangiopancreatography (ERCP); with removal of  calculi/debris from biliary/pancreatic duct(s) Diagnosis Code(s):        --- Professional ---                           K31.89, Other diseases of stomach and duodenum                           Z96.89, Presence of other specified functional                            implants                           K83.1, Obstruction of bile duct                           Z46.59, Encounter for fitting and adjustment of                            other gastrointestinal appliance and device                           K83.8, Other specified diseases of biliary tract CPT copyright 2018 [employer] Association. All rights reserved. The codes documented in this report are preliminary and upon coder review may  be revised to meet current compliance requirements. Justice Britain, MD 10/14/2018 11:50:09 AM Number of Addenda: 0

## 2018-10-14 NOTE — Anesthesia Preprocedure Evaluation (Addendum)
Anesthesia Evaluation  Patient identified by MRN, date of birth, ID band Patient awake    Reviewed: Allergy & Precautions, NPO status , Patient's Chart, lab work & pertinent test results  History of Anesthesia Complications Negative for: history of anesthetic complications  Airway Mallampati: II  TM Distance: >3 FB Neck ROM: Full    Dental  (+) Dental Advisory Given, Missing   Pulmonary neg pulmonary ROS,    breath sounds clear to auscultation       Cardiovascular hypertension, Pt. on medications (-) angina Rhythm:Regular Rate:Normal  '19 ECHO: EF 60-65%, mod AI, mild MR   Neuro/Psych negative neurological ROS     GI/Hepatic Neg liver ROS, GERD  Medicated and Controlled,  Endo/Other  diabetes, Oral Hypoglycemic AgentsHypothyroidism   Renal/GU negative Renal ROS     Musculoskeletal  (+) Arthritis , Osteoarthritis,    Abdominal   Peds  Hematology negative hematology ROS (+)   Anesthesia Other Findings S/p Breast cancer  Reproductive/Obstetrics                           Anesthesia Physical Anesthesia Plan  ASA: III  Anesthesia Plan: General   Post-op Pain Management:    Induction: Intravenous  PONV Risk Score and Plan: 3 and Ondansetron, Dexamethasone and Treatment may vary due to age or medical condition  Airway Management Planned: Oral ETT  Additional Equipment:   Intra-op Plan:   Post-operative Plan: Extubation in OR  Informed Consent: I have reviewed the patients History and Physical, chart, labs and discussed the procedure including the risks, benefits and alternatives for the proposed anesthesia with the patient or authorized representative who has indicated his/her understanding and acceptance.     Dental advisory given  Plan Discussed with: CRNA and Surgeon  Anesthesia Plan Comments: (Plan routine monitors, GETA)       Anesthesia Quick Evaluation

## 2018-10-14 NOTE — H&P (Signed)
GASTROENTEROLOGY OUTPATIENT PROCEDURE H&P NOTE   Primary Care Physician: [name], MD  HPI: Monique Figueroa is a 85 y.o. female who presents for ERCP.  Past Medical History:  Diagnosis Date  . Anemia   . Appendicitis   . Arthritis   . Biliary dyskinesia 03/24/2018  . Breast cancer ([place]) 06/2006   Invasive ductal and DCIS of left breast  . Breast tumor    History of bilateral breast tumors/cysts  . Cholecystitis   . Cyst of spinal meninges   . Diabetes mellitus without complication ([place])   . GERD (gastroesophageal reflux disease)   . Hypertension   . Hypothyroidism   . Metastatic breast cancer ([place]) 04/26/2016   mets to liver  . Thyroid cancer ([place])   . Tuberculosis    medullary carcinoma ; denies TB   . UTI (urinary tract infection)    hx   Past Surgical History:  Procedure Laterality Date  . APPENDECTOMY    . BALLOON DILATION N/A 04/20/2018   Procedure: BALLOON DILATION;  Surgeon: Rush Landmark Telford Nab., MD;  Location: [facility];  Service: Gastroenterology;  Laterality: N/A;  . BILIARY STENT PLACEMENT  04/20/2018   Procedure: BILIARY STENT PLACEMENT;  Surgeon: Rush Landmark Telford Nab., MD;  Location: [place];  Service: Gastroenterology;;  . BREAST CYST EXCISION Bilateral    Several asprirations and excisions  . BREAST LUMPECTOMY WITH AXILLARY LYMPH NODE BIOPSY Left 06/19/2006   Invasive ductal and in situ carcinoma, node negative  . CATARACT EXTRACTION, BILATERAL  2009  . CHOLECYSTECTOMY N/A 03/24/2018   Procedure: DIAGNOSTIC LAPAROSCOPY;  Surgeon: Fanny Skates, MD;  Location: [facility];  Service: General;  Laterality: N/A;  . ENDOSCOPIC RETROGRADE CHOLANGIOPANCREATOGRAPHY (ERCP) WITH PROPOFOL N/A 04/20/2018   Procedure: ENDOSCOPIC RETROGRADE CHOLANGIOPANCREATOGRAPHY (ERCP) WITH PROPOFOL ;  Surgeon: Irving Copas., MD;  Location: [facility];  Service: Gastroenterology;  Laterality: N/A;  . PORTACATH PLACEMENT N/A 06/05/2016   Procedure: INSERTION  PORT-A-CATH WITH [place];  Surgeon: Fanny Skates, MD;  Location: [place];  Service: General;  Laterality: N/A;  . SPHINCTEROTOMY  04/20/2018   Procedure: SPHINCTEROTOMY;  Surgeon: Mansouraty, Telford Nab., MD;  Location: [place];  Service: Gastroenterology;;  . [place]    . TOTAL THYROIDECTOMY     Current Facility-Administered Medications  Medication Dose Route Frequency Provider Last Rate Last Dose  . 0.9 %  sodium chloride infusion   Intravenous Continuous Mansouraty, Telford Nab., MD       Allergies  Allergen Reactions  . Perjeta [Pertuzumab] Shortness Of Breath and Palpitations    Turned red   . Sulfa Antibiotics Swelling   Family History  Problem Relation Age of Onset  . Rheum arthritis Mother   . Coronary artery disease Mother   . Dementia Mother   . Diabetes Mother   . Heart Problems Father   . Cancer Sister        Breast cancer  . Cancer Brother        Lung cancer  . Diabetes Sister   . Dementia Sister   . Colon cancer Neg Hx   . Esophageal cancer Neg Hx   . Inflammatory bowel disease Neg Hx   . Liver disease Neg Hx   . Pancreatic cancer Neg Hx   . Rectal cancer Neg Hx   . Stomach cancer Neg Hx    Social History   Socioeconomic History  . Marital status: Widowed    Spouse name: Not on file  . Number of children: 1  .  Years of education: Not on file  . Highest education level: Not on file  Occupational History  . Not on file  Social Needs  . Financial resource strain: Not on file  . Food insecurity:    Worry: Not on file    Inability: Not on file  . Transportation needs:    Medical: No    Non-medical: No  Tobacco Use  . Smoking status: Never Smoker  . Smokeless tobacco: Never Used  Substance and Sexual Activity  . Alcohol use: No  . Drug use: No  . Sexual activity: Not Currently    Birth control/protection: Post-menopausal  Lifestyle  . Physical activity:    Days per week: Not on file    Minutes per session: Not on file   . Stress: Not on file  Relationships  . Social connections:    Talks on phone: Not on file    Gets together: Not on file    Attends religious service: Not on file    Active member of club or organization: Not on file    Attends meetings of clubs or organizations: Not on file    Relationship status: Not on file  . Intimate partner violence:    Fear of current or ex partner: No    Emotionally abused: No    Physically abused: No    Forced sexual activity: No  Other Topics Concern  . Not on file  Social History Narrative  . Not on file    Physical Exam: Vital signs in last 24 hours: Temp:  [98.5 F (36.9 C)] 98.5 F (36.9 C) (03/11 0854) Pulse Rate:  [71-79] 79 (03/11 0911) Resp:  [18] 18 (03/11 0911) BP: (146-147)/(53-121) 147/53 (03/11 0911) SpO2:  [98 %-100 %] 98 % (03/11 0911) Weight:  [65.8 kg] 65.8 kg (03/11 0854)   GEN: NAD EYE: Sclerae anicteric ENT: MMM CV: RR without R/Gs  RESP: CTAB posteriorly GI: Soft, NT/ND NEURO:  Alert & Oriented x 3  Lab Results: No results for input(s): WBC, HGB, HCT, PLT in the last 72 hours. BMET No results for input(s): NA, K, CL, CO2, GLUCOSE, BUN, CREATININE, CALCIUM in the last 72 hours. LFT No results for input(s): PROT, ALBUMIN, AST, ALT, ALKPHOS, BILITOT, BILIDIR, IBILI in the last 72 hours. PT/INR No results for input(s): LABPROT, INR in the last 72 hours.   Impression / Plan: This is a 85 y.o.female who presents for ERCP.  The risks of an ERCP were discussed at length, including but not limited to the risk of perforation, bleeding, abdominal pain, post-ERCP pancreatitis (while usually mild can be severe and even life threatening).  The risks and benefits of endoscopic evaluation were discussed with the patient; these include but are not limited to the risk of perforation, infection, bleeding, missed lesions, lack of diagnosis, severe illness requiring hospitalization, as well as anesthesia and sedation related illnesses.   The patient is agreeable to proceed.    Justice Britain, MD [facility] Office # [phone]

## 2018-10-14 NOTE — Anesthesia Postprocedure Evaluation (Signed)
Anesthesia Post Note  Patient: Raoul Pitch  Procedure(s) Performed: ENDOSCOPIC RETROGRADE CHOLANGIOPANCREATOGRAPHY (ERCP) WITH PROPOFOL (N/A ) STENT REMOVAL BILIARY DILATION REMOVAL OF STONES BILIARY STENT PLACEMENT (N/A ) BIOPSY     Patient location during evaluation: PACU Anesthesia Type: General Level of consciousness: awake and alert, oriented and patient cooperative Pain management: pain level controlled Vital Signs Assessment: post-procedure vital signs reviewed and stable Respiratory status: spontaneous breathing, nonlabored ventilation and respiratory function stable Cardiovascular status: blood pressure returned to baseline and stable Postop Assessment: no apparent nausea or vomiting and able to ambulate Anesthetic complications: no    Last Vitals:  Vitals:   10/14/18 1200 10/14/18 1220  BP: (!) 154/65 (!) 158/63  Pulse: 74   Resp: 18 (!) 21  Temp:    SpO2: 100% 100%    Last Pain:  Vitals:   10/14/18 1220  TempSrc:   PainSc: 0-No pain                 Nkosi Cortright,E. Lennyx Verdell

## 2018-10-15 ENCOUNTER — Encounter (HOSPITAL_COMMUNITY): Payer: Self-pay | Admitting: Gastroenterology

## 2018-10-18 ENCOUNTER — Encounter: Payer: Self-pay | Admitting: Gastroenterology

## 2018-10-19 ENCOUNTER — Other Ambulatory Visit: Payer: Self-pay

## 2018-10-19 DIAGNOSIS — K831 Obstruction of bile duct: Secondary | ICD-10-CM

## 2018-10-21 NOTE — Progress Notes (Signed)
Patient Care Team: [name], MD as PCP - General (Internal Medicine)  DIAGNOSIS:    ICD-10-CM   1. Metastatic breast cancer ([place]) C50.919     SUMMARY OF ONCOLOGIC HISTORY:   Metastatic breast cancer ([place])   06/05/2006 Initial Diagnosis    Left breast biopsy: DCIS with apocrine features ER 30%, PR 13%    06/19/2006 Surgery    Left lumpectomy: stage I, pT1a, pN0(i)(sn), pMX, 0.3 cm IDC, grade 1, with extensive high-grade DCIS , margins negative ER 38 %, PR 93 %, Ki-67 10%, HER-2/neu 2+, no additional tissue available for FISH testing, with 0/1 left axillary lymph nodes.    07/30/2006 - 09/17/2006 Radiation Therapy    Adjuvant radiation therapy    09/24/2006 - 09/25/2011 Anti-estrogen oral therapy    Antiestrogen therapy with Arimidex 5 years    04/26/2016 Relapse/Recurrence    Innumerable hepatic lesions seen in both lobes most are 1-2 cm size somewhat confluent largest in the posterior right hepatic dome 3 x 4.6 cm    05/07/2016 PET scan    PET/CT scan: Extensive metastatic involvement of both lobes of the liver, primary could be liver, phalangeal or metastatic disease, hypermetabolic thoracic LN left axillary, left supraclavicular and right CP angle LN, hypermetabolic upper abdominal LN    05/16/2016 Initial Biopsy    Liver biopsy: Metastatic carcinoma breast primary strong positivity for CK 7 weak focal positivity for ER, GCDFP, negative for PR, CDX 2, TTF-1, WT 1; HER-2 positive    06/13/2016 - 11/07/2016 Chemotherapy    Taxotere Herceptin and Perjeta every 3 weeks (Taxotere was held for elevated LFTs for cycles 1 and 2)    11/28/2016 - 05/06/2018 Chemotherapy    Herceptin maintenance therapy for metastatic breast cancer every 3 weeks    03/24/2018 Surgery    Chronic cholecystitis: Diagnostic laparoscopy: Due to intermittent attacks of biliary colic: The gallbladder was rockhard and not distensible with tumor palpated in the porta hepatis, extensive lucent liver lesions, decision  was made to stop the surgery.    05/12/2018 - 05/22/2018 Radiation Therapy    Radiation to the brain    05/29/2018 -  Chemotherapy    Kadcyla     CHIEF COMPLIANT: Follow-up on Kadcyla  INTERVAL HISTORY: Monique Figueroa is a 85 y.o. with above-mentioned history of metastatic breast cancer with brain metastasis currently on Kadcyla.A CT CAP from 09/30/18 showed no evidence of disease progression. On 10/14/18 she underwent an ERCP and stent placement which showed inflammation but no evidence of malignancy in her duodenum or stomach. She presents to the clinic today with her niece. She denies any new tingling or numbness in her hands and toes, but notes mild numbness in her fingers that was present before treatment. She notes a normal appetite.   REVIEW OF SYSTEMS:   Constitutional: Denies fevers, chills or abnormal weight loss Eyes: Denies blurriness of vision Ears, nose, mouth, throat, and face: Denies mucositis or sore throat Respiratory: Denies cough, dyspnea or wheezes Cardiovascular: Denies palpitation, chest discomfort Gastrointestinal: Denies nausea, heartburn or change in bowel habits Skin: Denies abnormal skin rashes Lymphatics: Denies new lymphadenopathy or easy bruising Neurological: Denies new weaknesses (+) mild numbness in fingers Behavioral/Psych: Mood is stable, no new changes  Extremities: No lower extremity edema Breast: denies any pain or lumps or nodules in either breasts All other systems were reviewed with the patient and are negative.  I have reviewed the past medical history, past surgical history, social history and family history with  the patient and they are unchanged from previous note.  ALLERGIES:  is allergic to perjeta [pertuzumab] and sulfa antibiotics.  MEDICATIONS:  Current Outpatient Medications  Medication Sig Dispense Refill  . acetaminophen (TYLENOL) 500 MG tablet Take 500 mg by mouth 2 (two) times daily as needed for moderate pain or headache.     . benzocaine (ORAJEL) 10 % mucosal gel Use as directed 1 application in the mouth or throat as needed for mouth pain.    . calcium carbonate (TUMS - DOSED IN MG ELEMENTAL CALCIUM) 500 MG chewable tablet Chew 1 tablet by mouth 2 (two) times daily as needed for indigestion or heartburn.     . diphenhydrAMINE (BENADRYL) 25 MG tablet Take 25 mg by mouth daily as needed for allergies.    . hydrochlorothiazide (HYDRODIURIL) 25 MG tablet Take 25 mg by mouth daily.    Marland Kitchen levothyroxine (SYNTHROID, LEVOTHROID) 125 MCG tablet Take 125 mcg by mouth daily before breakfast.     . Liniments (BLUE-EMU SUPER STRENGTH EX) Apply 1 application topically daily as needed (muscle pain).    . metFORMIN (GLUCOPHAGE) 500 MG tablet Take 500 mg by mouth daily with breakfast.     . omeprazole (PRILOSEC OTC) 20 MG tablet Take 20 mg by mouth daily as needed (acid reflux).     . ondansetron (ZOFRAN-ODT) 8 MG disintegrating tablet TAKE 1 TABLET (8 MG TOTAL) BY MOUTH EVERY 8 (EIGHT) HOURS AS NEEDED FOR NAUSEA OR VOMITING. 20 tablet 1  . oxyCODONE-acetaminophen (PERCOCET/ROXICET) 5-325 MG tablet Take 1 tablet by mouth daily as needed for severe pain.    Marland Kitchen Phenazopyridine HCl (AZO-STANDARD PO) Take 1 tablet by mouth 2 (two) times daily as needed (uti symptoms).    . potassium chloride (MICRO-K) 10 MEQ CR capsule Take 1 capsule (10 mEq total) by mouth 2 (two) times daily. 30 capsule 1  . prochlorperazine (COMPAZINE) 10 MG tablet Take 1 tablet (10 mg total) by mouth every 6 (six) hours as needed for nausea or vomiting. 30 tablet 6  . Propylene Glycol (SYSTANE COMPLETE) 0.6 % SOLN Place 1 drop into both eyes 2 (two) times daily.    . sodium chloride (OCEAN) 0.65 % SOLN nasal spray Place 1 spray into both nostrils as needed for congestion.     No current facility-administered medications for this visit.     PHYSICAL EXAMINATION: ECOG PERFORMANCE STATUS: 1 - Symptomatic but completely ambulatory  There were no vitals filed for this  visit. There were no vitals filed for this visit.  GENERAL: alert, no distress and comfortable SKIN: skin color, texture, turgor are normal, no rashes or significant lesions EYES: normal, Conjunctiva are pink and non-injected, sclera clear OROPHARYNX: no exudate, no erythema and lips, buccal mucosa, and tongue normal  NECK: supple, thyroid normal size, non-tender, without nodularity LYMPH: no palpable lymphadenopathy in the cervical, axillary or inguinal LUNGS: clear to auscultation and percussion with normal breathing effort HEART: regular rate & rhythm and no murmurs and no lower extremity edema ABDOMEN: abdomen soft, non-tender and normal bowel sounds MUSCULOSKELETAL: no cyanosis of digits and no clubbing  NEURO: alert & oriented x 3 with fluent speech, no focal motor/sensory deficits EXTREMITIES: No lower extremity edema  LABORATORY DATA:  I have reviewed the data as listed CMP Latest Ref Rng & Units 10/01/2018 09/10/2018 08/20/2018  Glucose 70 - 99 mg/dL 104(H) 101(H) 93  BUN 8 - 23 mg/dL '8 10 12  '$ Creatinine 0.44 - 1.00 mg/dL 0.73 0.69 0.67  Sodium 135 -  145 mmol/L 141 140 139  Potassium 3.5 - 5.1 mmol/L 3.2(L) 3.6 3.0(LL)  Chloride 98 - 111 mmol/L 104 105 101  CO2 22 - 32 mmol/L '27 25 28  '$ Calcium 8.9 - 10.3 mg/dL 8.9 9.0 9.5  Total Protein 6.5 - 8.1 g/dL 6.9 6.9 7.1  Total Bilirubin 0.3 - 1.2 mg/dL 0.6 0.7 1.0  Alkaline Phos 38 - 126 U/L 65 66 58  AST 15 - 41 U/L 36 34 32  ALT 0 - 44 U/L '14 15 14    '$ Lab Results  Component Value Date   WBC 3.6 (L) 10/22/2018   HGB 12.4 10/22/2018   HCT 37.4 10/22/2018   MCV 91.0 10/22/2018   PLT 137 (L) 10/22/2018   NEUTROABS 2.1 10/22/2018    ASSESSMENT & PLAN:  Metastatic breast cancer ([facility]) Metastatic breast cancer with innumerable liver metastases detected on ultrasound and recent MRI of the liver on 04/26/2016 (Prior history of left breast IDC T1 1 N0 stage IA 0.3 cm grade 1 tumor that was ER 38%, PR 93%, Ki-67 10%, HER-2 2+, no  tissue for FISH testing, 0/1 lymph node negative, status post lumpectomy radiation and 5 years of Arimidex completed in February 2013)  PET/CT scan: 05/07/2016: Extensive metastatic involvement of both lobes of the liver, primary could be liver, phalangeal or metastatic disease, hypermetabolic thoracic lymph nodes left axillary, left supraclavicular and right CP angle lymph nodes, hypermetabolic upper abdominal lymph nodes  Liver biopsy10/07/2016: Metastatic carcinoma breast primary strong positivity for CK 7 weak focal positivity for ER, GCDFP, negative for PR, CDX 2, TTF-1, WT 1; HER-2 positive  Treatment plan: 1. Taxotere Herceptin and Perjeta every 3 weeks palliative chemotherapy started 06/13/16 (Perjeta discontinued after 5 cycles due to allergy) 2. followed by Herceptin maintenance stopped 05/08/2018 due to progression ----------------------------------------------------------------------------------------------------------------------------------------- Current treatment: Kadcyla starting 10/25/2019todayiscycle 4 Patient completed palliative radiation therapy to the brain Echo on 04/21/18 shows normal EF of 55-60%  Kadcyla toxicities: No side effects to Kadcyla. Hypokalemia: micro K  CT CAP 09/30/2018:  Right hepatic lobe hypoperfused region 5 x 5.1 cm mildly contracted from 5.1 x 6.4 cm, additional nodules right and left lobes not changed in size.  Thrombosis of the right portal vein.    10/14/2018: ERCP with stent placement  Continue with current treatment with Kadcyla and follow-up with me every other treatment.    No orders of the defined types were placed in this encounter.  The patient has a good understanding of the overall plan. she agrees with it. she will call with any problems that may develop before the next visit here.  Nicholas Lose, MD 10/22/2018  Julious Oka Dorshimer am acting as scribe for Dr. Nicholas Lose.  I have reviewed the above documentation for  accuracy and completeness, and I agree with the above.

## 2018-10-22 ENCOUNTER — Inpatient Hospital Stay: Payer: Medicare Other

## 2018-10-22 ENCOUNTER — Inpatient Hospital Stay: Payer: Medicare Other | Attending: Hematology and Oncology

## 2018-10-22 ENCOUNTER — Inpatient Hospital Stay: Payer: Medicare Other | Admitting: Hematology and Oncology

## 2018-10-22 ENCOUNTER — Other Ambulatory Visit: Payer: Self-pay

## 2018-10-22 VITALS — BP 147/76 | HR 83 | Temp 97.7°F | Resp 18 | Wt 145.3 lb

## 2018-10-22 DIAGNOSIS — C7931 Secondary malignant neoplasm of brain: Secondary | ICD-10-CM

## 2018-10-22 DIAGNOSIS — C787 Secondary malignant neoplasm of liver and intrahepatic bile duct: Secondary | ICD-10-CM | POA: Diagnosis not present

## 2018-10-22 DIAGNOSIS — Z5112 Encounter for antineoplastic immunotherapy: Secondary | ICD-10-CM | POA: Insufficient documentation

## 2018-10-22 DIAGNOSIS — C50912 Malignant neoplasm of unspecified site of left female breast: Secondary | ICD-10-CM

## 2018-10-22 DIAGNOSIS — Z86718 Personal history of other venous thrombosis and embolism: Secondary | ICD-10-CM

## 2018-10-22 DIAGNOSIS — Z79899 Other long term (current) drug therapy: Secondary | ICD-10-CM

## 2018-10-22 DIAGNOSIS — Z923 Personal history of irradiation: Secondary | ICD-10-CM | POA: Diagnosis not present

## 2018-10-22 DIAGNOSIS — Z7984 Long term (current) use of oral hypoglycemic drugs: Secondary | ICD-10-CM | POA: Diagnosis not present

## 2018-10-22 DIAGNOSIS — Z170 Estrogen receptor positive status [ER+]: Secondary | ICD-10-CM | POA: Insufficient documentation

## 2018-10-22 DIAGNOSIS — E876 Hypokalemia: Secondary | ICD-10-CM | POA: Diagnosis not present

## 2018-10-22 DIAGNOSIS — C50919 Malignant neoplasm of unspecified site of unspecified female breast: Secondary | ICD-10-CM

## 2018-10-22 DIAGNOSIS — Z95828 Presence of other vascular implants and grafts: Secondary | ICD-10-CM

## 2018-10-22 LAB — CBC WITH DIFFERENTIAL (CANCER CENTER ONLY)
Abs Immature Granulocytes: 0 10*3/uL (ref 0.00–0.07)
Basophils Absolute: 0 10*3/uL (ref 0.0–0.1)
Basophils Relative: 1 %
Eosinophils Absolute: 0.1 10*3/uL (ref 0.0–0.5)
Eosinophils Relative: 3 %
HCT: 37.4 % (ref 36.0–46.0)
Hemoglobin: 12.4 g/dL (ref 12.0–15.0)
Immature Granulocytes: 0 %
Lymphocytes Relative: 25 %
Lymphs Abs: 0.9 10*3/uL (ref 0.7–4.0)
MCH: 30.2 pg (ref 26.0–34.0)
MCHC: 33.2 g/dL (ref 30.0–36.0)
MCV: 91 fL (ref 80.0–100.0)
Monocytes Absolute: 0.5 10*3/uL (ref 0.1–1.0)
Monocytes Relative: 14 %
NEUTROS ABS: 2.1 10*3/uL (ref 1.7–7.7)
NEUTROS PCT: 57 %
Platelet Count: 137 10*3/uL — ABNORMAL LOW (ref 150–400)
RBC: 4.11 MIL/uL (ref 3.87–5.11)
RDW: 14.8 % (ref 11.5–15.5)
WBC Count: 3.6 10*3/uL — ABNORMAL LOW (ref 4.0–10.5)
nRBC: 0 % (ref 0.0–0.2)

## 2018-10-22 LAB — CMP (CANCER CENTER ONLY)
ALT: 15 U/L (ref 0–44)
AST: 34 U/L (ref 15–41)
Albumin: 3.2 g/dL — ABNORMAL LOW (ref 3.5–5.0)
Alkaline Phosphatase: 68 U/L (ref 38–126)
Anion gap: 9 (ref 5–15)
BUN: 10 mg/dL (ref 8–23)
CO2: 25 mmol/L (ref 22–32)
Calcium: 8.8 mg/dL — ABNORMAL LOW (ref 8.9–10.3)
Chloride: 106 mmol/L (ref 98–111)
Creatinine: 0.68 mg/dL (ref 0.44–1.00)
GFR, Est AFR Am: >60 mL/min (ref >60)
GFR, Estimated: >60 mL/min (ref >60)
Glucose, Bld: 94 mg/dL (ref 70–99)
Potassium: 3.7 mmol/L (ref 3.5–5.1)
SODIUM: 140 mmol/L (ref 135–145)
Total Bilirubin: 1 mg/dL (ref 0.3–1.2)
Total Protein: 6.8 g/dL (ref 6.5–8.1)

## 2018-10-22 MED ORDER — HEPARIN SOD (PORK) LOCK FLUSH 100 UNIT/ML IV SOLN
500.0000 [IU] | Freq: Once | INTRAVENOUS | Status: AC | PRN
  Administered 2018-10-22: 500 [IU]
  Filled 2018-10-22: qty 5

## 2018-10-22 MED ORDER — SODIUM CHLORIDE 0.9 % IV SOLN
Freq: Once | INTRAVENOUS | Status: AC
  Administered 2018-10-22: 11:00:00 via INTRAVENOUS
  Filled 2018-10-22: qty 250

## 2018-10-22 MED ORDER — DIPHENHYDRAMINE HCL 25 MG PO CAPS
ORAL_CAPSULE | ORAL | Status: AC
  Filled 2018-10-22: qty 2

## 2018-10-22 MED ORDER — SODIUM CHLORIDE 0.9% FLUSH
10.0000 mL | INTRAVENOUS | Status: DC | PRN
  Administered 2018-10-22: 10 mL via INTRAVENOUS
  Filled 2018-10-22: qty 10

## 2018-10-22 MED ORDER — SODIUM CHLORIDE 0.9% FLUSH
10.0000 mL | INTRAVENOUS | Status: DC | PRN
  Administered 2018-10-22: 10 mL
  Filled 2018-10-22: qty 10

## 2018-10-22 MED ORDER — ACETAMINOPHEN 325 MG PO TABS
ORAL_TABLET | ORAL | Status: AC
  Filled 2018-10-22: qty 2

## 2018-10-22 MED ORDER — ACETAMINOPHEN 325 MG PO TABS
650.0000 mg | ORAL_TABLET | Freq: Once | ORAL | Status: AC
  Administered 2018-10-22: 650 mg via ORAL

## 2018-10-22 MED ORDER — DIPHENHYDRAMINE HCL 25 MG PO CAPS
50.0000 mg | ORAL_CAPSULE | Freq: Once | ORAL | Status: AC
  Administered 2018-10-22: 50 mg via ORAL

## 2018-10-22 MED ORDER — SODIUM CHLORIDE 0.9 % IV SOLN
3.0000 mg/kg | Freq: Once | INTRAVENOUS | Status: AC
  Administered 2018-10-22: 200 mg via INTRAVENOUS
  Filled 2018-10-22: qty 10

## 2018-10-22 NOTE — Assessment & Plan Note (Signed)
Metastatic breast cancer with innumerable liver metastases detected on ultrasound and recent MRI of the liver on 04/26/2016 (Prior history of left breast IDC T1 1 N0 stage IA 0.3 cm grade 1 tumor that was ER 38%, PR 93%, Ki-67 10%, HER-2 2+, no tissue for FISH testing, 0/1 lymph node negative, status post lumpectomy radiation and 5 years of Arimidex completed in February 2013)  PET/CT scan: 05/07/2016: Extensive metastatic involvement of both lobes of the liver, primary could be liver, phalangeal or metastatic disease, hypermetabolic thoracic lymph nodes left axillary, left supraclavicular and right CP angle lymph nodes, hypermetabolic upper abdominal lymph nodes  Liver biopsy10/07/2016: Metastatic carcinoma breast primary strong positivity for CK 7 weak focal positivity for ER, GCDFP, negative for PR, CDX 2, TTF-1, WT 1; HER-2 positive  Treatment plan: 1. Taxotere Herceptin and Perjeta every 3 weeks palliative chemotherapy started 06/13/16 (Perjeta discontinued after 5 cycles due to allergy) 2. followed by Herceptin maintenance stopped 05/08/2018 due to progression ----------------------------------------------------------------------------------------------------------------------------------------- Current treatment: Kadcyla starting 10/25/2019todayiscycle 4 Patient completed palliative radiation therapy to the brain Echo on 04/21/18 shows normal EF of 55-60%  Kadcyla toxicities: No side effects to Kadcyla. Hypokalemia: micro K  CT CAP 09/30/2018:  Right hepatic lobe hypoperfused region 5 x 5.1 cm mildly contracted from 5.1 x 6.4 cm, additional nodules right and left lobes not changed in size.  Thrombosis of the right portal vein.    10/14/2018: ERCP with stent placement  Continue with current treatment with Kadcyla and follow-up with me every other treatment.

## 2018-10-22 NOTE — Patient Instructions (Addendum)
Coronavirus (COVID-19) Are you at risk?  Are you at risk for the Coronavirus (COVID-19)?  To be considered HIGH RISK for Coronavirus (COVID-19), you have to meet the following criteria:  . Traveled to [place], [place], [place], [place] or [place]; or in the [place] to [place], [place], [place], or [place]; and have fever, cough, and shortness of breath within the last 2 weeks of travel OR . Been in close contact with a person diagnosed with COVID-19 within the last 2 weeks and have fever, cough, and shortness of breath . IF YOU DO NOT MEET THESE CRITERIA, YOU ARE CONSIDERED LOW RISK FOR COVID-19.  What to do if you are HIGH RISK for COVID-19?  . If you are having a medical emergency, call 911. . Seek medical care right away. Before you go to a doctor's office, urgent care or emergency department, call ahead and tell them about your recent travel, contact with someone diagnosed with COVID-19, and your symptoms. You should receive instructions from your physician's office regarding next steps of care.  . When you arrive at healthcare provider, tell the healthcare staff immediately you have returned from visiting [place], [place], [place], [place] or [place]; or traveled in the [place] to [place], [place], [place], or [place]; in the last two weeks or you have been in close contact with a person diagnosed with COVID-19 in the last 2 weeks.   . Tell the health care staff about your symptoms: fever, cough and shortness of breath. . After you have been seen by a medical provider, you will be either: o Tested for (COVID-19) and discharged home on quarantine except to seek medical care if symptoms worsen, and asked to  - Stay home and avoid contact with others until you get your results (4-5 days)  - Avoid travel on public transportation if possible (such as bus, train, or airplane) or o Sent to the Emergency Department by EMS for evaluation, COVID-19 testing, and possible  admission depending on your condition and test results.  What to do if you are LOW RISK for COVID-19?  Reduce your risk of any infection by using the same precautions used for avoiding the common cold or flu:  . Wash your hands often with soap and warm water for at least 20 seconds.  If soap and water are not readily available, use an alcohol-based hand sanitizer with at least 60% alcohol.  . If coughing or sneezing, cover your mouth and nose by coughing or sneezing into the elbow areas of your shirt or coat, into a tissue or into your sleeve (not your hands). . Avoid shaking hands with others and consider head nods or verbal greetings only. . Avoid touching your eyes, nose, or mouth with unwashed hands.  . Avoid close contact with people who are sick. . Avoid places or events with large numbers of people in one location, like concerts or sporting events. . Carefully consider travel plans you have or are making. . If you are planning any travel outside or inside the [place], visit the CDC's Travelers' Health webpage for the latest health notices. . If you have some symptoms but not all symptoms, continue to monitor at home and seek medical attention if your symptoms worsen. . If you are having a medical emergency, call 911.  ADDITIONAL HEALTHCARE OPTIONS FOR PATIENTS  [facility] / e-Visit: [email or web address]         [facility]: [phone]   Urgent   Care: [phone]                   [facility]: [phone]   [facility] Discharge Instructions for Patients Receiving Chemotherapy  Today you received the following chemotherapy agents: Ado-trastuzumab emtansine (Kadcyla)   To help prevent nausea and vomiting after your treatment, we encourage you to take your nausea medication as directed.    If you develop nausea and vomiting that is not controlled by your nausea medication, call  the clinic.   BELOW ARE SYMPTOMS THAT SHOULD BE REPORTED IMMEDIATELY:  *FEVER GREATER THAN 100.5 F  *CHILLS WITH OR WITHOUT FEVER  NAUSEA AND VOMITING THAT IS NOT CONTROLLED WITH YOUR NAUSEA MEDICATION  *UNUSUAL SHORTNESS OF BREATH  *UNUSUAL BRUISING OR BLEEDING  TENDERNESS IN MOUTH AND THROAT WITH OR WITHOUT PRESENCE OF ULCERS  *URINARY PROBLEMS  *BOWEL PROBLEMS  UNUSUAL RASH Items with * indicate a potential emergency and should be followed up as soon as possible.  Feel free to call the clinic should you have any questions or concerns. The clinic phone number is [phone].  Please show the CHEMO ALERT CARD at check-in to the Emergency Department and triage nurse.   

## 2018-11-02 ENCOUNTER — Other Ambulatory Visit: Payer: Self-pay | Admitting: Radiation Therapy

## 2018-11-02 DIAGNOSIS — C7931 Secondary malignant neoplasm of brain: Secondary | ICD-10-CM

## 2018-11-02 DIAGNOSIS — C7949 Secondary malignant neoplasm of other parts of nervous system: Principal | ICD-10-CM

## 2018-11-12 ENCOUNTER — Inpatient Hospital Stay: Payer: Medicare Other

## 2018-11-12 ENCOUNTER — Ambulatory Visit: Payer: Medicare Other | Admitting: Adult Health

## 2018-11-12 ENCOUNTER — Other Ambulatory Visit: Payer: Self-pay

## 2018-11-12 ENCOUNTER — Inpatient Hospital Stay: Payer: Medicare Other | Attending: Hematology and Oncology

## 2018-11-12 VITALS — BP 168/74 | HR 86 | Temp 99.2°F | Resp 16

## 2018-11-12 DIAGNOSIS — C50919 Malignant neoplasm of unspecified site of unspecified female breast: Secondary | ICD-10-CM

## 2018-11-12 DIAGNOSIS — C7931 Secondary malignant neoplasm of brain: Secondary | ICD-10-CM | POA: Insufficient documentation

## 2018-11-12 DIAGNOSIS — C50912 Malignant neoplasm of unspecified site of left female breast: Secondary | ICD-10-CM | POA: Insufficient documentation

## 2018-11-12 DIAGNOSIS — Z923 Personal history of irradiation: Secondary | ICD-10-CM | POA: Diagnosis not present

## 2018-11-12 DIAGNOSIS — Z5112 Encounter for antineoplastic immunotherapy: Secondary | ICD-10-CM | POA: Insufficient documentation

## 2018-11-12 DIAGNOSIS — Z79899 Other long term (current) drug therapy: Secondary | ICD-10-CM | POA: Insufficient documentation

## 2018-11-12 DIAGNOSIS — Z95828 Presence of other vascular implants and grafts: Secondary | ICD-10-CM

## 2018-11-12 DIAGNOSIS — Z170 Estrogen receptor positive status [ER+]: Secondary | ICD-10-CM | POA: Insufficient documentation

## 2018-11-12 DIAGNOSIS — R5383 Other fatigue: Secondary | ICD-10-CM | POA: Diagnosis not present

## 2018-11-12 DIAGNOSIS — Z7984 Long term (current) use of oral hypoglycemic drugs: Secondary | ICD-10-CM | POA: Diagnosis not present

## 2018-11-12 DIAGNOSIS — C787 Secondary malignant neoplasm of liver and intrahepatic bile duct: Secondary | ICD-10-CM | POA: Diagnosis not present

## 2018-11-12 LAB — CMP (CANCER CENTER ONLY)
ALT: 15 U/L (ref 0–44)
AST: 35 U/L (ref 15–41)
Albumin: 3.2 g/dL — ABNORMAL LOW (ref 3.5–5.0)
Alkaline Phosphatase: 67 U/L (ref 38–126)
Anion gap: 10 (ref 5–15)
BUN: 15 mg/dL (ref 8–23)
CO2: 25 mmol/L (ref 22–32)
Calcium: 8.9 mg/dL (ref 8.9–10.3)
Chloride: 102 mmol/L (ref 98–111)
Creatinine: 0.77 mg/dL (ref 0.44–1.00)
GFR, Est AFR Am: >60 mL/min (ref >60)
GFR, Estimated: >60 mL/min (ref >60)
Glucose, Bld: 119 mg/dL — ABNORMAL HIGH (ref 70–99)
Potassium: 3.3 mmol/L — ABNORMAL LOW (ref 3.5–5.1)
Sodium: 137 mmol/L (ref 135–145)
Total Bilirubin: 0.6 mg/dL (ref 0.3–1.2)
Total Protein: 6.9 g/dL (ref 6.5–8.1)

## 2018-11-12 LAB — CBC WITH DIFFERENTIAL (CANCER CENTER ONLY)
Abs Immature Granulocytes: 0 10*3/uL (ref 0.00–0.07)
Basophils Absolute: 0 10*3/uL (ref 0.0–0.1)
Basophils Relative: 1 %
Eosinophils Absolute: 0.1 10*3/uL (ref 0.0–0.5)
Eosinophils Relative: 1 %
HCT: 37.9 % (ref 36.0–46.0)
Hemoglobin: 12.4 g/dL (ref 12.0–15.0)
Immature Granulocytes: 0 %
Lymphocytes Relative: 23 %
Lymphs Abs: 1 10*3/uL (ref 0.7–4.0)
MCH: 30 pg (ref 26.0–34.0)
MCHC: 32.7 g/dL (ref 30.0–36.0)
MCV: 91.8 fL (ref 80.0–100.0)
Monocytes Absolute: 0.5 10*3/uL (ref 0.1–1.0)
Monocytes Relative: 11 %
Neutro Abs: 2.9 10*3/uL (ref 1.7–7.7)
Neutrophils Relative %: 64 %
Platelet Count: 136 10*3/uL — ABNORMAL LOW (ref 150–400)
RBC: 4.13 MIL/uL (ref 3.87–5.11)
RDW: 15.9 % — ABNORMAL HIGH (ref 11.5–15.5)
WBC Count: 4.5 10*3/uL (ref 4.0–10.5)
nRBC: 0 % (ref 0.0–0.2)

## 2018-11-12 MED ORDER — DIPHENHYDRAMINE HCL 25 MG PO CAPS
50.0000 mg | ORAL_CAPSULE | Freq: Once | ORAL | Status: AC
  Administered 2018-11-12: 50 mg via ORAL

## 2018-11-12 MED ORDER — SODIUM CHLORIDE 0.9% FLUSH
10.0000 mL | INTRAVENOUS | Status: DC | PRN
  Administered 2018-11-12: 10 mL
  Filled 2018-11-12: qty 10

## 2018-11-12 MED ORDER — HEPARIN SOD (PORK) LOCK FLUSH 100 UNIT/ML IV SOLN
500.0000 [IU] | Freq: Once | INTRAVENOUS | Status: AC | PRN
  Administered 2018-11-12: 500 [IU]
  Filled 2018-11-12: qty 5

## 2018-11-12 MED ORDER — ACETAMINOPHEN 325 MG PO TABS
650.0000 mg | ORAL_TABLET | Freq: Once | ORAL | Status: AC
  Administered 2018-11-12: 650 mg via ORAL

## 2018-11-12 MED ORDER — DIPHENHYDRAMINE HCL 25 MG PO CAPS
ORAL_CAPSULE | ORAL | Status: AC
  Filled 2018-11-12: qty 1

## 2018-11-12 MED ORDER — ACETAMINOPHEN 325 MG PO TABS
ORAL_TABLET | ORAL | Status: AC
  Filled 2018-11-12: qty 2

## 2018-11-12 MED ORDER — SODIUM CHLORIDE 0.9 % IV SOLN
3.0000 mg/kg | Freq: Once | INTRAVENOUS | Status: AC
  Administered 2018-11-12: 200 mg via INTRAVENOUS
  Filled 2018-11-12: qty 10

## 2018-11-12 MED ORDER — SODIUM CHLORIDE 0.9% FLUSH
10.0000 mL | INTRAVENOUS | Status: DC | PRN
  Administered 2018-11-12: 10 mL via INTRAVENOUS
  Filled 2018-11-12: qty 10

## 2018-11-12 MED ORDER — SODIUM CHLORIDE 0.9 % IV SOLN
Freq: Once | INTRAVENOUS | Status: AC
  Administered 2018-11-12: 16:00:00 via INTRAVENOUS
  Filled 2018-11-12: qty 250

## 2018-11-12 NOTE — Patient Instructions (Signed)
[facility]cer Center Discharge Instructions for Patients Receiving Chemotherapy  Today you received the following chemotherapy agents Kadcyla  To help prevent nausea and vomiting after your treatment, we encourage you to take your nausea medication as directed   If you develop nausea and vomiting that is not controlled by your nausea medication, call the clinic.   BELOW ARE SYMPTOMS THAT SHOULD BE REPORTED IMMEDIATELY:  *FEVER GREATER THAN 100.5 F  *CHILLS WITH OR WITHOUT FEVER  NAUSEA AND VOMITING THAT IS NOT CONTROLLED WITH YOUR NAUSEA MEDICATION  *UNUSUAL SHORTNESS OF BREATH  *UNUSUAL BRUISING OR BLEEDING  TENDERNESS IN MOUTH AND THROAT WITH OR WITHOUT PRESENCE OF ULCERS  *URINARY PROBLEMS  *BOWEL PROBLEMS  UNUSUAL RASH Items with * indicate a potential emergency and should be followed up as soon as possible.  Feel free to call the clinic should you have any questions or concerns. The clinic phone number is [phone].  Please show the CHEMO ALERT CARD at check-in to the Emergency Department and triage nurse.   

## 2018-11-26 ENCOUNTER — Other Ambulatory Visit: Payer: Self-pay | Admitting: Radiation Therapy

## 2018-11-30 ENCOUNTER — Ambulatory Visit (HOSPITAL_COMMUNITY): Payer: Medicare Other

## 2018-11-30 ENCOUNTER — Encounter (HOSPITAL_COMMUNITY): Payer: Self-pay

## 2018-11-30 NOTE — Assessment & Plan Note (Addendum)
Metastatic breast cancer with innumerable liver metastases detected on ultrasound and recent MRI of the liver on 04/26/2016 (Prior history of left breast IDC T1 1 N0 stage IA 0.3 cm grade 1 tumor that was ER 38%, PR 93%, Ki-67 10%, HER-2 2+, no tissue for FISH testing, 0/1 lymph node negative, status post lumpectomy radiation and 5 years of Arimidex completed in February 2013)  PET/CT scan: 05/07/2016: Extensive metastatic involvement of both lobes of the liver, primary could be liver, phalangeal or metastatic disease, hypermetabolic thoracic lymph nodes left axillary, left supraclavicular and right CP angle lymph nodes, hypermetabolic upper abdominal lymph nodes  Liver biopsy10/07/2016: Metastatic carcinoma breast primary strong positivity for CK 7 weak focal positivity for ER, GCDFP, negative for PR, CDX 2, TTF-1, WT 1; HER-2 positive  Treatment plan: 1. Taxotere Herceptin and Perjeta every 3 weeks palliative chemotherapy started 06/13/16 (Perjeta discontinued after 5 cycles due to allergy) 2. followed by Herceptin maintenance stopped 05/08/2018 due to progression ----------------------------------------------------------------------------------------------------------------------------------------- Current treatment: Kadcyla starting 10/25/2019todayiscycle6 Patient completed palliative radiation therapy to the brain Echo on 04/21/18 shows normal EF of 55-60%  Kadcyla toxicities: No side effects to Kadcyla. Hypokalemia: micro K  CT CAP 09/30/2018: Right hepatic lobe hypoperfused region 5 x 5.1 cm mildly contracted from 5.1 x 6.4 cm, additional nodules right and left lobes not changed in size.  Thrombosis of the right portal vein.   10/14/2018: ERCP with stent placement  Continue with current treatment with Kadcyla and follow-up with me every other treatment.

## 2018-12-01 ENCOUNTER — Encounter: Payer: Self-pay | Admitting: Pharmacist

## 2018-12-02 ENCOUNTER — Other Ambulatory Visit: Payer: Self-pay | Admitting: *Deleted

## 2018-12-02 ENCOUNTER — Ambulatory Visit: Payer: Self-pay | Admitting: Urology

## 2018-12-02 DIAGNOSIS — C50919 Malignant neoplasm of unspecified site of unspecified female breast: Secondary | ICD-10-CM

## 2018-12-02 NOTE — Progress Notes (Signed)
error 

## 2018-12-02 NOTE — Progress Notes (Signed)
Patient Care Team: [name], MD as PCP - General (Internal Medicine)  DIAGNOSIS:    ICD-10-CM   1. Metastatic breast cancer ([place]) C50.919     SUMMARY OF ONCOLOGIC HISTORY:   Metastatic breast cancer ([place])   06/05/2006 Initial Diagnosis    Left breast biopsy: DCIS with apocrine features ER 30%, PR 13%    06/19/2006 Surgery    Left lumpectomy: stage I, pT1a, pN0(i)(sn), pMX, 0.3 cm IDC, grade 1, with extensive high-grade DCIS , margins negative ER 38 %, PR 93 %, Ki-67 10%, HER-2/neu 2+, no additional tissue available for FISH testing, with 0/1 left axillary lymph nodes.    07/30/2006 - 09/17/2006 Radiation Therapy    Adjuvant radiation therapy    09/24/2006 - 09/25/2011 Anti-estrogen oral therapy    Antiestrogen therapy with Arimidex 5 years    04/26/2016 Relapse/Recurrence    Innumerable hepatic lesions seen in both lobes most are 1-2 cm size somewhat confluent largest in the posterior right hepatic dome 3 x 4.6 cm    05/07/2016 PET scan    PET/CT scan: Extensive metastatic involvement of both lobes of the liver, primary could be liver, phalangeal or metastatic disease, hypermetabolic thoracic LN left axillary, left supraclavicular and right CP angle LN, hypermetabolic upper abdominal LN    05/16/2016 Initial Biopsy    Liver biopsy: Metastatic carcinoma breast primary strong positivity for CK 7 weak focal positivity for ER, GCDFP, negative for PR, CDX 2, TTF-1, WT 1; HER-2 positive    06/13/2016 - 11/07/2016 Chemotherapy    Taxotere Herceptin and Perjeta every 3 weeks (Taxotere was held for elevated LFTs for cycles 1 and 2)    11/28/2016 - 05/06/2018 Chemotherapy    Herceptin maintenance therapy for metastatic breast cancer every 3 weeks    03/24/2018 Surgery    Chronic cholecystitis: Diagnostic laparoscopy: Due to intermittent attacks of biliary colic: The gallbladder was rockhard and not distensible with tumor palpated in the porta hepatis, extensive lucent liver lesions, decision  was made to stop the surgery.    05/12/2018 - 05/22/2018 Radiation Therapy    Radiation to the brain    05/29/2018 -  Chemotherapy    Kadcyla     CHIEF COMPLIANT: Follow-up on Kadcyla  INTERVAL HISTORY: Monique Figueroa is a 85 y.o. with above-mentioned history of metastatic breast cancer with brain metastasis currently on Kadcyla.She presents to the clinic alone today for treatment.  She has not had any major side effects from Kadcyla.  Denies any neuropathy.  Denies any nausea or vomiting.  She does have chronic mild fatigue which is unchanged from before.  She has been taking a lot of precautions to avoid exposure to outside world because of coronavirus.  REVIEW OF SYSTEMS:   Constitutional: Denies fevers, chills or abnormal weight loss Eyes: Denies blurriness of vision Ears, nose, mouth, throat, and face: Denies mucositis or sore throat Respiratory: Denies cough, dyspnea or wheezes Cardiovascular: Denies palpitation, chest discomfort Gastrointestinal: Denies nausea, heartburn or change in bowel habits Skin: Denies abnormal skin rashes Lymphatics: Denies new lymphadenopathy or easy bruising Neurological: Denies numbness, tingling or new weaknesses Behavioral/Psych: Mood is stable, no new changes  Extremities: No lower extremity edema Breast: denies any pain or lumps or nodules in either breasts All other systems were reviewed with the patient and are negative.  I have reviewed the past medical history, past surgical history, social history and family history with the patient and they are unchanged from previous note.  ALLERGIES:  is allergic  to perjeta [pertuzumab] and sulfa antibiotics.  MEDICATIONS:  Current Outpatient Medications  Medication Sig Dispense Refill  . acetaminophen (TYLENOL) 500 MG tablet Take 500 mg by mouth 2 (two) times daily as needed for moderate pain or headache.    . benzocaine (ORAJEL) 10 % mucosal gel Use as directed 1 application in the mouth or  throat as needed for mouth pain.    . calcium carbonate (TUMS - DOSED IN MG ELEMENTAL CALCIUM) 500 MG chewable tablet Chew 1 tablet by mouth 2 (two) times daily as needed for indigestion or heartburn.     . diphenhydrAMINE (BENADRYL) 25 MG tablet Take 25 mg by mouth daily as needed for allergies.    . hydrochlorothiazide (HYDRODIURIL) 25 MG tablet Take 25 mg by mouth daily.    Marland Kitchen levothyroxine (SYNTHROID, LEVOTHROID) 125 MCG tablet Take 125 mcg by mouth daily before breakfast.     . Liniments (BLUE-EMU SUPER STRENGTH EX) Apply 1 application topically daily as needed (muscle pain).    . metFORMIN (GLUCOPHAGE) 500 MG tablet Take 500 mg by mouth daily with breakfast.     . omeprazole (PRILOSEC OTC) 20 MG tablet Take 20 mg by mouth daily as needed (acid reflux).     . ondansetron (ZOFRAN-ODT) 8 MG disintegrating tablet TAKE 1 TABLET (8 MG TOTAL) BY MOUTH EVERY 8 (EIGHT) HOURS AS NEEDED FOR NAUSEA OR VOMITING. 20 tablet 1  . oxyCODONE-acetaminophen (PERCOCET/ROXICET) 5-325 MG tablet Take 1 tablet by mouth daily as needed for severe pain.    Marland Kitchen Phenazopyridine HCl (AZO-STANDARD PO) Take 1 tablet by mouth 2 (two) times daily as needed (uti symptoms).    . potassium chloride (MICRO-K) 10 MEQ CR capsule Take 1 capsule (10 mEq total) by mouth 2 (two) times daily. 30 capsule 1  . prochlorperazine (COMPAZINE) 10 MG tablet Take 1 tablet (10 mg total) by mouth every 6 (six) hours as needed for nausea or vomiting. 30 tablet 6  . Propylene Glycol (SYSTANE COMPLETE) 0.6 % SOLN Place 1 drop into both eyes 2 (two) times daily.    . sodium chloride (OCEAN) 0.65 % SOLN nasal spray Place 1 spray into both nostrils as needed for congestion.     No current facility-administered medications for this visit.     PHYSICAL EXAMINATION: ECOG PERFORMANCE STATUS: 1 - Symptomatic but completely ambulatory  There were no vitals filed for this visit. There were no vitals filed for this visit.  GENERAL: alert, no distress and  comfortable SKIN: skin color, texture, turgor are normal, no rashes or significant lesions EYES: normal, Conjunctiva are pink and non-injected, sclera clear OROPHARYNX: no exudate, no erythema and lips, buccal mucosa, and tongue normal  NECK: supple, thyroid normal size, non-tender, without nodularity LYMPH: no palpable lymphadenopathy in the cervical, axillary or inguinal LUNGS: clear to auscultation and percussion with normal breathing effort HEART: regular rate & rhythm and no murmurs and no lower extremity edema ABDOMEN: abdomen soft, non-tender and normal bowel sounds MUSCULOSKELETAL: no cyanosis of digits and no clubbing  NEURO: alert & oriented x 3 with fluent speech, no focal motor/sensory deficits EXTREMITIES: No lower extremity edema  LABORATORY DATA:  I have reviewed the data as listed CMP Latest Ref Rng & Units 11/12/2018 10/22/2018 10/01/2018  Glucose 70 - 99 mg/dL 119(H) 94 104(H)  BUN 8 - 23 mg/dL '15 10 8  '$ Creatinine 0.44 - 1.00 mg/dL 0.77 0.68 0.73  Sodium 135 - 145 mmol/L 137 140 141  Potassium 3.5 - 5.1 mmol/L 3.3(L) 3.7 3.2(L)  Chloride 98 - 111 mmol/L 102 106 104  CO2 22 - 32 mmol/L '25 25 27  '$ Calcium 8.9 - 10.3 mg/dL 8.9 8.8(L) 8.9  Total Protein 6.5 - 8.1 g/dL 6.9 6.8 6.9  Total Bilirubin 0.3 - 1.2 mg/dL 0.6 1.0 0.6  Alkaline Phos 38 - 126 U/L 67 68 65  AST 15 - 41 U/L 35 34 36  ALT 0 - 44 U/L '15 15 14    '$ Lab Results  Component Value Date   WBC 4.5 11/12/2018   HGB 12.4 11/12/2018   HCT 37.9 11/12/2018   MCV 91.8 11/12/2018   PLT 136 (L) 11/12/2018   NEUTROABS 2.9 11/12/2018    ASSESSMENT & PLAN:  Metastatic breast cancer ([place]) Metastatic breast cancer with innumerable liver metastases detected on ultrasound and recent MRI of the liver on 04/26/2016 (Prior history of left breast IDC T1 1 N0 stage IA 0.3 cm grade 1 tumor that was ER 38%, PR 93%, Ki-67 10%, HER-2 2+, no tissue for FISH testing, 0/1 lymph node negative, status post lumpectomy radiation and 5  years of Arimidex completed in February 2013)  PET/CT scan: 05/07/2016: Extensive metastatic involvement of both lobes of the liver, primary could be liver, phalangeal or metastatic disease, hypermetabolic thoracic lymph nodes left axillary, left supraclavicular and right CP angle lymph nodes, hypermetabolic upper abdominal lymph nodes  Liver biopsy10/07/2016: Metastatic carcinoma breast primary strong positivity for CK 7 weak focal positivity for ER, GCDFP, negative for PR, CDX 2, TTF-1, WT 1; HER-2 positive  Treatment plan: 1. Taxotere Herceptin and Perjeta every 3 weeks palliative chemotherapy started 06/13/16 (Perjeta discontinued after 5 cycles due to allergy) 2. followed by Herceptin maintenance stopped 05/08/2018 due to progression ----------------------------------------------------------------------------------------------------------------------------------------- Current treatment: Kadcyla starting 10/25/2019todayiscycle6 Patient completed palliative radiation therapy to the brain Echo on 04/21/18 shows normal EF of 55-60%  Kadcyla toxicities: No side effects to Kadcyla.  She has mild tingling of her fingers and toes but it has not progressed. Hypokalemia: Instructed her to eat potassium containing foods.  CT CAP 09/30/2018: Right hepatic lobe hypoperfused region 5 x 5.1 cm mildly contracted from 5.1 x 6.4 cm, additional nodules right and left lobes not changed in size.  Thrombosis of the right portal vein.   10/14/2018: ERCP with stent placement  Continue with current treatment with Kadcyla and follow-up with me every other treatment.  We will plan to obtain scans in August she has an echocardiogram scheduled for later today. No orders of the defined types were placed in this encounter.  The patient has a good understanding of the overall plan. she agrees with it. she will call with any problems that may develop before the next visit here.  Nicholas Lose, MD  12/03/2018  Julious Oka Dorshimer am acting as scribe for Dr. Nicholas Lose.  I have reviewed the above documentation for accuracy and completeness, and I agree with the above.

## 2018-12-03 ENCOUNTER — Ambulatory Visit: Payer: Medicare Other | Admitting: Urology

## 2018-12-03 ENCOUNTER — Ambulatory Visit (HOSPITAL_COMMUNITY)
Admission: RE | Admit: 2018-12-03 | Discharge: 2018-12-03 | Disposition: A | Discharge status: Home / Self Care | Payer: Medicare Other | Source: Ambulatory Visit | Attending: Hematology and Oncology | Admitting: Hematology and Oncology

## 2018-12-03 ENCOUNTER — Inpatient Hospital Stay: Payer: Medicare Other

## 2018-12-03 ENCOUNTER — Other Ambulatory Visit (HOSPITAL_COMMUNITY): Payer: Medicare Other

## 2018-12-03 ENCOUNTER — Inpatient Hospital Stay (HOSPITAL_BASED_OUTPATIENT_CLINIC_OR_DEPARTMENT_OTHER): Payer: Medicare Other | Admitting: Hematology and Oncology

## 2018-12-03 ENCOUNTER — Other Ambulatory Visit: Payer: Self-pay

## 2018-12-03 DIAGNOSIS — C50919 Malignant neoplasm of unspecified site of unspecified female breast: Secondary | ICD-10-CM

## 2018-12-03 DIAGNOSIS — I10 Essential (primary) hypertension: Secondary | ICD-10-CM | POA: Diagnosis not present

## 2018-12-03 DIAGNOSIS — Z5112 Encounter for antineoplastic immunotherapy: Secondary | ICD-10-CM | POA: Diagnosis not present

## 2018-12-03 DIAGNOSIS — C787 Secondary malignant neoplasm of liver and intrahepatic bile duct: Secondary | ICD-10-CM | POA: Insufficient documentation

## 2018-12-03 DIAGNOSIS — C73 Malignant neoplasm of thyroid gland: Secondary | ICD-10-CM | POA: Insufficient documentation

## 2018-12-03 DIAGNOSIS — E119 Type 2 diabetes mellitus without complications: Secondary | ICD-10-CM | POA: Insufficient documentation

## 2018-12-03 DIAGNOSIS — Z7984 Long term (current) use of oral hypoglycemic drugs: Secondary | ICD-10-CM | POA: Diagnosis not present

## 2018-12-03 DIAGNOSIS — Z923 Personal history of irradiation: Secondary | ICD-10-CM | POA: Diagnosis not present

## 2018-12-03 DIAGNOSIS — I082 Rheumatic disorders of both aortic and tricuspid valves: Secondary | ICD-10-CM | POA: Diagnosis not present

## 2018-12-03 DIAGNOSIS — Z170 Estrogen receptor positive status [ER+]: Secondary | ICD-10-CM

## 2018-12-03 DIAGNOSIS — R5383 Other fatigue: Secondary | ICD-10-CM

## 2018-12-03 DIAGNOSIS — Z79899 Other long term (current) drug therapy: Secondary | ICD-10-CM | POA: Diagnosis not present

## 2018-12-03 DIAGNOSIS — C7931 Secondary malignant neoplasm of brain: Secondary | ICD-10-CM

## 2018-12-03 DIAGNOSIS — Z95828 Presence of other vascular implants and grafts: Secondary | ICD-10-CM

## 2018-12-03 DIAGNOSIS — C50912 Malignant neoplasm of unspecified site of left female breast: Secondary | ICD-10-CM | POA: Diagnosis not present

## 2018-12-03 LAB — CMP (CANCER CENTER ONLY)
ALT: 15 U/L (ref 0–44)
AST: 37 U/L (ref 15–41)
Albumin: 3.1 g/dL — ABNORMAL LOW (ref 3.5–5.0)
Alkaline Phosphatase: 74 U/L (ref 38–126)
Anion gap: 10 (ref 5–15)
BUN: 10 mg/dL (ref 8–23)
CO2: 25 mmol/L (ref 22–32)
Calcium: 8.4 mg/dL — ABNORMAL LOW (ref 8.9–10.3)
Chloride: 106 mmol/L (ref 98–111)
Creatinine: 0.67 mg/dL (ref 0.44–1.00)
GFR, Est AFR Am: >60 mL/min (ref >60)
GFR, Estimated: >60 mL/min (ref >60)
Glucose, Bld: 94 mg/dL (ref 70–99)
Potassium: 3.6 mmol/L (ref 3.5–5.1)
Sodium: 141 mmol/L (ref 135–145)
Total Bilirubin: 1 mg/dL (ref 0.3–1.2)
Total Protein: 6.5 g/dL (ref 6.5–8.1)

## 2018-12-03 LAB — CBC WITH DIFFERENTIAL (CANCER CENTER ONLY)
Abs Immature Granulocytes: 0.01 10*3/uL (ref 0.00–0.07)
Basophils Absolute: 0 10*3/uL (ref 0.0–0.1)
Basophils Relative: 1 %
Eosinophils Absolute: 0.1 10*3/uL (ref 0.0–0.5)
Eosinophils Relative: 2 %
HCT: 35.8 % — ABNORMAL LOW (ref 36.0–46.0)
Hemoglobin: 12 g/dL (ref 12.0–15.0)
Immature Granulocytes: 0 %
Lymphocytes Relative: 23 %
Lymphs Abs: 0.8 10*3/uL (ref 0.7–4.0)
MCH: 30.5 pg (ref 26.0–34.0)
MCHC: 33.5 g/dL (ref 30.0–36.0)
MCV: 91.1 fL (ref 80.0–100.0)
Monocytes Absolute: 0.5 10*3/uL (ref 0.1–1.0)
Monocytes Relative: 14 %
Neutro Abs: 2 10*3/uL (ref 1.7–7.7)
Neutrophils Relative %: 60 %
Platelet Count: 125 10*3/uL — ABNORMAL LOW (ref 150–400)
RBC: 3.93 MIL/uL (ref 3.87–5.11)
RDW: 17.2 % — ABNORMAL HIGH (ref 11.5–15.5)
WBC Count: 3.4 10*3/uL — ABNORMAL LOW (ref 4.0–10.5)
nRBC: 0 % (ref 0.0–0.2)

## 2018-12-03 LAB — ECHOCARDIOGRAM COMPLETE
Height: 65 in
Weight: 2360 oz

## 2018-12-03 MED ORDER — HEPARIN SOD (PORK) LOCK FLUSH 100 UNIT/ML IV SOLN
500.0000 [IU] | Freq: Once | INTRAVENOUS | Status: AC | PRN
  Administered 2018-12-03: 12:00:00 500 [IU]
  Filled 2018-12-03: qty 5

## 2018-12-03 MED ORDER — SODIUM CHLORIDE 0.9 % IV SOLN
3.0000 mg/kg | Freq: Once | INTRAVENOUS | Status: AC
  Administered 2018-12-03: 11:00:00 200 mg via INTRAVENOUS
  Filled 2018-12-03: qty 10

## 2018-12-03 MED ORDER — SODIUM CHLORIDE 0.9% FLUSH
10.0000 mL | INTRAVENOUS | Status: DC | PRN
  Administered 2018-12-03: 10 mL
  Filled 2018-12-03: qty 10

## 2018-12-03 MED ORDER — ACETAMINOPHEN 325 MG PO TABS
ORAL_TABLET | ORAL | Status: AC
  Filled 2018-12-03: qty 2

## 2018-12-03 MED ORDER — SODIUM CHLORIDE 0.9 % IV SOLN
Freq: Once | INTRAVENOUS | Status: AC
  Administered 2018-12-03: 11:00:00 via INTRAVENOUS
  Filled 2018-12-03: qty 250

## 2018-12-03 MED ORDER — DIPHENHYDRAMINE HCL 25 MG PO CAPS
50.0000 mg | ORAL_CAPSULE | Freq: Once | ORAL | Status: AC
  Administered 2018-12-03: 50 mg via ORAL

## 2018-12-03 MED ORDER — ACETAMINOPHEN 325 MG PO TABS
650.0000 mg | ORAL_TABLET | Freq: Once | ORAL | Status: AC
  Administered 2018-12-03: 11:00:00 650 mg via ORAL

## 2018-12-03 MED ORDER — SODIUM CHLORIDE 0.9% FLUSH
10.0000 mL | INTRAVENOUS | Status: DC | PRN
  Administered 2018-12-03: 10 mL via INTRAVENOUS
  Filled 2018-12-03: qty 10

## 2018-12-03 MED ORDER — DIPHENHYDRAMINE HCL 25 MG PO CAPS
ORAL_CAPSULE | ORAL | Status: AC
  Filled 2018-12-03: qty 2

## 2018-12-03 NOTE — Patient Instructions (Signed)

## 2018-12-03 NOTE — Patient Instructions (Signed)
Coronavirus (COVID-19) Are you at risk?  Are you at risk for the Coronavirus (COVID-19)?  To be considered HIGH RISK for Coronavirus (COVID-19), you have to meet the following criteria:  . Traveled to [place], [place], [place], [place] or [place]; or in the [place] to [place], [place], [place], or [place]; and have fever, cough, and shortness of breath within the last 2 weeks of travel OR . Been in close contact with a person diagnosed with COVID-19 within the last 2 weeks and have fever, cough, and shortness of breath . IF YOU DO NOT MEET THESE CRITERIA, YOU ARE CONSIDERED LOW RISK FOR COVID-19.  What to do if you are HIGH RISK for COVID-19?  . If you are having a medical emergency, call 911. . Seek medical care right away. Before you go to a doctor's office, urgent care or emergency department, call ahead and tell them about your recent travel, contact with someone diagnosed with COVID-19, and your symptoms. You should receive instructions from your physician's office regarding next steps of care.  . When you arrive at healthcare provider, tell the healthcare staff immediately you have returned from visiting [place], [place], [place], [place] or [place]; or traveled in the [place] to [place], [place], [place], or [place]; in the last two weeks or you have been in close contact with a person diagnosed with COVID-19 in the last 2 weeks.   . Tell the health care staff about your symptoms: fever, cough and shortness of breath. . After you have been seen by a medical provider, you will be either: o Tested for (COVID-19) and discharged home on quarantine except to seek medical care if symptoms worsen, and asked to  - Stay home and avoid contact with others until you get your results (4-5 days)  - Avoid travel on public transportation if possible (such as bus, train, or airplane) or o Sent to the Emergency Department by EMS for evaluation, COVID-19 testing, and possible  admission depending on your condition and test results.  What to do if you are LOW RISK for COVID-19?  Reduce your risk of any infection by using the same precautions used for avoiding the common cold or flu:  . Wash your hands often with soap and warm water for at least 20 seconds.  If soap and water are not readily available, use an alcohol-based hand sanitizer with at least 60% alcohol.  . If coughing or sneezing, cover your mouth and nose by coughing or sneezing into the elbow areas of your shirt or coat, into a tissue or into your sleeve (not your hands). . Avoid shaking hands with others and consider head nods or verbal greetings only. . Avoid touching your eyes, nose, or mouth with unwashed hands.  . Avoid close contact with people who are sick. . Avoid places or events with large numbers of people in one location, like concerts or sporting events. . Carefully consider travel plans you have or are making. . If you are planning any travel outside or inside the [place], visit the CDC's Travelers' Health webpage for the latest health notices. . If you have some symptoms but not all symptoms, continue to monitor at home and seek medical attention if your symptoms worsen. . If you are having a medical emergency, call 911.   ADDITIONAL HEALTHCARE OPTIONS FOR PATIENTS  [facility] / e-Visit: [email or web address]         [facility]: [phone]  Onset   Urgent Care: [facility]: [facility] Discharge Instructions for Patients Receiving Chemotherapy  Today you received the following chemotherapy agents Kadcyla   To help prevent nausea and vomiting after your treatment, we encourage you to take your nausea medication as directed.    If you develop nausea and vomiting that is not controlled by your nausea medication, call the clinic.   BELOW ARE  SYMPTOMS THAT SHOULD BE REPORTED IMMEDIATELY:  *FEVER GREATER THAN 100.5 F  *CHILLS WITH OR WITHOUT FEVER  NAUSEA AND VOMITING THAT IS NOT CONTROLLED WITH YOUR NAUSEA MEDICATION  *UNUSUAL SHORTNESS OF BREATH  *UNUSUAL BRUISING OR BLEEDING  TENDERNESS IN MOUTH AND THROAT WITH OR WITHOUT PRESENCE OF ULCERS  *URINARY PROBLEMS  *BOWEL PROBLEMS  UNUSUAL RASH Items with * indicate a potential emergency and should be followed up as soon as possible.  Feel free to call the clinic should you have any questions or concerns. The clinic phone number is [phone].  Please show the [place] [facility] at check-in to the Emergency Department and triage nurse.

## 2018-12-03 NOTE — Progress Notes (Signed)
  Echocardiogram 2D Echocardiogram has been performed.  Darlina Sicilian M 12/03/2018, 1:48 PM

## 2018-12-10 ENCOUNTER — Other Ambulatory Visit: Payer: Self-pay | Admitting: Hematology and Oncology

## 2018-12-10 ENCOUNTER — Ambulatory Visit (HOSPITAL_COMMUNITY): Payer: Medicare Other

## 2018-12-16 ENCOUNTER — Ambulatory Visit: Payer: Medicare Other | Admitting: Urology

## 2018-12-24 ENCOUNTER — Other Ambulatory Visit: Payer: Medicare Other

## 2018-12-24 ENCOUNTER — Other Ambulatory Visit: Payer: Self-pay

## 2018-12-24 ENCOUNTER — Inpatient Hospital Stay: Payer: Medicare Other | Attending: Hematology and Oncology

## 2018-12-24 ENCOUNTER — Inpatient Hospital Stay: Payer: Medicare Other

## 2018-12-24 VITALS — BP 127/72 | HR 96 | Temp 97.8°F | Resp 16

## 2018-12-24 DIAGNOSIS — C50912 Malignant neoplasm of unspecified site of left female breast: Secondary | ICD-10-CM | POA: Diagnosis not present

## 2018-12-24 DIAGNOSIS — Z5112 Encounter for antineoplastic immunotherapy: Secondary | ICD-10-CM | POA: Diagnosis not present

## 2018-12-24 DIAGNOSIS — Z170 Estrogen receptor positive status [ER+]: Secondary | ICD-10-CM | POA: Insufficient documentation

## 2018-12-24 DIAGNOSIS — C50919 Malignant neoplasm of unspecified site of unspecified female breast: Secondary | ICD-10-CM

## 2018-12-24 LAB — CMP (CANCER CENTER ONLY)
ALT: 17 U/L (ref 0–44)
AST: 40 U/L (ref 15–41)
Albumin: 3.2 g/dL — ABNORMAL LOW (ref 3.5–5.0)
Alkaline Phosphatase: 67 U/L (ref 38–126)
Anion gap: 10 (ref 5–15)
BUN: 9 mg/dL (ref 8–23)
CO2: 26 mmol/L (ref 22–32)
Calcium: 9 mg/dL (ref 8.9–10.3)
Chloride: 104 mmol/L (ref 98–111)
Creatinine: 0.69 mg/dL (ref 0.44–1.00)
GFR, Est AFR Am: >60 mL/min (ref >60)
GFR, Estimated: >60 mL/min (ref >60)
Glucose, Bld: 93 mg/dL (ref 70–99)
Potassium: 3.3 mmol/L — ABNORMAL LOW (ref 3.5–5.1)
Sodium: 140 mmol/L (ref 135–145)
Total Bilirubin: 1.1 mg/dL (ref 0.3–1.2)
Total Protein: 6.6 g/dL (ref 6.5–8.1)

## 2018-12-24 LAB — CBC WITH DIFFERENTIAL (CANCER CENTER ONLY)
Abs Immature Granulocytes: 0.01 10*3/uL (ref 0.00–0.07)
Basophils Absolute: 0 10*3/uL (ref 0.0–0.1)
Basophils Relative: 1 %
Eosinophils Absolute: 0.1 10*3/uL (ref 0.0–0.5)
Eosinophils Relative: 2 %
HCT: 38.1 % (ref 36.0–46.0)
Hemoglobin: 12.5 g/dL (ref 12.0–15.0)
Immature Granulocytes: 0 %
Lymphocytes Relative: 25 %
Lymphs Abs: 1 10*3/uL (ref 0.7–4.0)
MCH: 30.4 pg (ref 26.0–34.0)
MCHC: 32.8 g/dL (ref 30.0–36.0)
MCV: 92.7 fL (ref 80.0–100.0)
Monocytes Absolute: 0.5 10*3/uL (ref 0.1–1.0)
Monocytes Relative: 13 %
Neutro Abs: 2.3 10*3/uL (ref 1.7–7.7)
Neutrophils Relative %: 59 %
Platelet Count: 142 10*3/uL — ABNORMAL LOW (ref 150–400)
RBC: 4.11 MIL/uL (ref 3.87–5.11)
RDW: 16.9 % — ABNORMAL HIGH (ref 11.5–15.5)
WBC Count: 3.9 10*3/uL — ABNORMAL LOW (ref 4.0–10.5)
nRBC: 0 % (ref 0.0–0.2)

## 2018-12-24 MED ORDER — ACETAMINOPHEN 325 MG PO TABS
650.0000 mg | ORAL_TABLET | Freq: Once | ORAL | Status: AC
  Administered 2018-12-24: 650 mg via ORAL

## 2018-12-24 MED ORDER — HEPARIN SOD (PORK) LOCK FLUSH 100 UNIT/ML IV SOLN
500.0000 [IU] | Freq: Once | INTRAVENOUS | Status: AC | PRN
  Administered 2018-12-24: 500 [IU]
  Filled 2018-12-24: qty 5

## 2018-12-24 MED ORDER — DIPHENHYDRAMINE HCL 25 MG PO CAPS
50.0000 mg | ORAL_CAPSULE | Freq: Once | ORAL | Status: AC
  Administered 2018-12-24: 50 mg via ORAL

## 2018-12-24 MED ORDER — SODIUM CHLORIDE 0.9% FLUSH
10.0000 mL | INTRAVENOUS | Status: DC | PRN
  Administered 2018-12-24: 11:00:00 10 mL
  Filled 2018-12-24: qty 10

## 2018-12-24 MED ORDER — SODIUM CHLORIDE 0.9 % IV SOLN
3.0000 mg/kg | Freq: Once | INTRAVENOUS | Status: AC
  Administered 2018-12-24: 200 mg via INTRAVENOUS
  Filled 2018-12-24: qty 10

## 2018-12-24 MED ORDER — SODIUM CHLORIDE 0.9 % IV SOLN
Freq: Once | INTRAVENOUS | Status: AC
  Administered 2018-12-24: 09:00:00 via INTRAVENOUS
  Filled 2018-12-24: qty 250

## 2018-12-24 MED ORDER — DIPHENHYDRAMINE HCL 25 MG PO CAPS
ORAL_CAPSULE | ORAL | Status: AC
  Filled 2018-12-24: qty 2

## 2018-12-24 MED ORDER — ACETAMINOPHEN 325 MG PO TABS
ORAL_TABLET | ORAL | Status: AC
  Filled 2018-12-24: qty 2

## 2018-12-24 NOTE — Patient Instructions (Signed)
[facility] Discharge Instructions for Patients Receiving Chemotherapy  Today you received the following chemotherapy agents Kadcyla  To help prevent nausea and vomiting after your treatment, we encourage you to take your nausea medication as directed   If you develop nausea and vomiting that is not controlled by your nausea medication, call the clinic.   BELOW ARE SYMPTOMS THAT SHOULD BE REPORTED IMMEDIATELY:  *FEVER GREATER THAN 100.5 F  *CHILLS WITH OR WITHOUT FEVER  NAUSEA AND VOMITING THAT IS NOT CONTROLLED WITH YOUR NAUSEA MEDICATION  *UNUSUAL SHORTNESS OF BREATH  *UNUSUAL BRUISING OR BLEEDING  TENDERNESS IN MOUTH AND THROAT WITH OR WITHOUT PRESENCE OF ULCERS  *URINARY PROBLEMS  *BOWEL PROBLEMS  UNUSUAL RASH Items with * indicate a potential emergency and should be followed up as soon as possible.  Feel free to call the clinic should you have any questions or concerns. The clinic phone number is [phone].  Please show the CHEMO ALERT CARD at check-in to the Emergency Department and triage nurse.   

## 2018-12-26 ENCOUNTER — Other Ambulatory Visit: Payer: Self-pay | Admitting: Hematology and Oncology

## 2019-01-05 ENCOUNTER — Ambulatory Visit (HOSPITAL_COMMUNITY): Admission: RE | Admit: 2019-01-05 | Payer: Medicare Other | Source: Ambulatory Visit

## 2019-01-13 ENCOUNTER — Ambulatory Visit: Payer: Medicare Other | Admitting: Urology

## 2019-01-14 ENCOUNTER — Inpatient Hospital Stay (HOSPITAL_BASED_OUTPATIENT_CLINIC_OR_DEPARTMENT_OTHER): Payer: Medicare Other | Admitting: Adult Health

## 2019-01-14 ENCOUNTER — Inpatient Hospital Stay: Payer: Medicare Other

## 2019-01-14 ENCOUNTER — Other Ambulatory Visit: Payer: Self-pay

## 2019-01-14 ENCOUNTER — Inpatient Hospital Stay: Payer: Medicare Other | Attending: Hematology and Oncology

## 2019-01-14 ENCOUNTER — Encounter: Payer: Self-pay | Admitting: Adult Health

## 2019-01-14 VITALS — BP 141/78 | HR 89 | Temp 99.1°F | Resp 17 | Ht 65.0 in | Wt 147.3 lb

## 2019-01-14 DIAGNOSIS — C50919 Malignant neoplasm of unspecified site of unspecified female breast: Secondary | ICD-10-CM

## 2019-01-14 DIAGNOSIS — Z9221 Personal history of antineoplastic chemotherapy: Secondary | ICD-10-CM | POA: Insufficient documentation

## 2019-01-14 DIAGNOSIS — C7951 Secondary malignant neoplasm of bone: Secondary | ICD-10-CM | POA: Insufficient documentation

## 2019-01-14 DIAGNOSIS — K219 Gastro-esophageal reflux disease without esophagitis: Secondary | ICD-10-CM | POA: Diagnosis not present

## 2019-01-14 DIAGNOSIS — Z95828 Presence of other vascular implants and grafts: Secondary | ICD-10-CM

## 2019-01-14 DIAGNOSIS — C7931 Secondary malignant neoplasm of brain: Secondary | ICD-10-CM

## 2019-01-14 DIAGNOSIS — E119 Type 2 diabetes mellitus without complications: Secondary | ICD-10-CM

## 2019-01-14 DIAGNOSIS — K30 Functional dyspepsia: Secondary | ICD-10-CM

## 2019-01-14 DIAGNOSIS — I10 Essential (primary) hypertension: Secondary | ICD-10-CM | POA: Insufficient documentation

## 2019-01-14 DIAGNOSIS — C50912 Malignant neoplasm of unspecified site of left female breast: Secondary | ICD-10-CM

## 2019-01-14 DIAGNOSIS — M129 Arthropathy, unspecified: Secondary | ICD-10-CM | POA: Diagnosis not present

## 2019-01-14 DIAGNOSIS — K828 Other specified diseases of gallbladder: Secondary | ICD-10-CM | POA: Insufficient documentation

## 2019-01-14 DIAGNOSIS — Z170 Estrogen receptor positive status [ER+]: Secondary | ICD-10-CM

## 2019-01-14 DIAGNOSIS — Z923 Personal history of irradiation: Secondary | ICD-10-CM | POA: Diagnosis not present

## 2019-01-14 DIAGNOSIS — Z85850 Personal history of malignant neoplasm of thyroid: Secondary | ICD-10-CM | POA: Diagnosis not present

## 2019-01-14 DIAGNOSIS — C787 Secondary malignant neoplasm of liver and intrahepatic bile duct: Secondary | ICD-10-CM | POA: Diagnosis not present

## 2019-01-14 DIAGNOSIS — E039 Hypothyroidism, unspecified: Secondary | ICD-10-CM

## 2019-01-14 DIAGNOSIS — Z5112 Encounter for antineoplastic immunotherapy: Secondary | ICD-10-CM | POA: Insufficient documentation

## 2019-01-14 DIAGNOSIS — Z79811 Long term (current) use of aromatase inhibitors: Secondary | ICD-10-CM | POA: Diagnosis not present

## 2019-01-14 LAB — CMP (CANCER CENTER ONLY)
ALT: 15 U/L (ref 0–44)
AST: 36 U/L (ref 15–41)
Albumin: 3.1 g/dL — ABNORMAL LOW (ref 3.5–5.0)
Alkaline Phosphatase: 78 U/L (ref 38–126)
Anion gap: 8 (ref 5–15)
BUN: 12 mg/dL (ref 8–23)
CO2: 26 mmol/L (ref 22–32)
Calcium: 8.3 mg/dL — ABNORMAL LOW (ref 8.9–10.3)
Chloride: 107 mmol/L (ref 98–111)
Creatinine: 0.7 mg/dL (ref 0.44–1.00)
GFR, Est AFR Am: >60 mL/min (ref >60)
GFR, Estimated: >60 mL/min (ref >60)
Glucose, Bld: 98 mg/dL (ref 70–99)
Potassium: 3.5 mmol/L (ref 3.5–5.1)
Sodium: 141 mmol/L (ref 135–145)
Total Bilirubin: 0.7 mg/dL (ref 0.3–1.2)
Total Protein: 6.4 g/dL — ABNORMAL LOW (ref 6.5–8.1)

## 2019-01-14 LAB — CBC WITH DIFFERENTIAL (CANCER CENTER ONLY)
Abs Immature Granulocytes: 0.01 10*3/uL (ref 0.00–0.07)
Basophils Absolute: 0 10*3/uL (ref 0.0–0.1)
Basophils Relative: 1 %
Eosinophils Absolute: 0.1 10*3/uL (ref 0.0–0.5)
Eosinophils Relative: 2 %
HCT: 36.7 % (ref 36.0–46.0)
Hemoglobin: 12.1 g/dL (ref 12.0–15.0)
Immature Granulocytes: 0 %
Lymphocytes Relative: 24 %
Lymphs Abs: 0.8 10*3/uL (ref 0.7–4.0)
MCH: 30.6 pg (ref 26.0–34.0)
MCHC: 33 g/dL (ref 30.0–36.0)
MCV: 92.9 fL (ref 80.0–100.0)
Monocytes Absolute: 0.5 10*3/uL (ref 0.1–1.0)
Monocytes Relative: 14 %
Neutro Abs: 2 10*3/uL (ref 1.7–7.7)
Neutrophils Relative %: 59 %
Platelet Count: 110 10*3/uL — ABNORMAL LOW (ref 150–400)
RBC: 3.95 MIL/uL (ref 3.87–5.11)
RDW: 15.9 % — ABNORMAL HIGH (ref 11.5–15.5)
WBC Count: 3.5 10*3/uL — ABNORMAL LOW (ref 4.0–10.5)
nRBC: 0 % (ref 0.0–0.2)

## 2019-01-14 MED ORDER — ACETAMINOPHEN 325 MG PO TABS
650.0000 mg | ORAL_TABLET | Freq: Once | ORAL | Status: AC
  Administered 2019-01-14: 11:00:00 650 mg via ORAL

## 2019-01-14 MED ORDER — SODIUM CHLORIDE 0.9% FLUSH
10.0000 mL | INTRAVENOUS | Status: DC | PRN
  Administered 2019-01-14: 12:00:00 10 mL
  Filled 2019-01-14: qty 10

## 2019-01-14 MED ORDER — ACETAMINOPHEN 325 MG PO TABS
ORAL_TABLET | ORAL | Status: AC
  Filled 2019-01-14: qty 2

## 2019-01-14 MED ORDER — SODIUM CHLORIDE 0.9 % IV SOLN
3.0000 mg/kg | Freq: Once | INTRAVENOUS | Status: AC
  Administered 2019-01-14: 11:00:00 200 mg via INTRAVENOUS
  Filled 2019-01-14: qty 10

## 2019-01-14 MED ORDER — SODIUM CHLORIDE 0.9 % IV SOLN
Freq: Once | INTRAVENOUS | Status: AC
  Administered 2019-01-14: 10:00:00 via INTRAVENOUS
  Filled 2019-01-14: qty 250

## 2019-01-14 MED ORDER — DIPHENHYDRAMINE HCL 25 MG PO CAPS
50.0000 mg | ORAL_CAPSULE | Freq: Once | ORAL | Status: AC
  Administered 2019-01-14: 11:00:00 50 mg via ORAL

## 2019-01-14 MED ORDER — HEPARIN SOD (PORK) LOCK FLUSH 100 UNIT/ML IV SOLN
500.0000 [IU] | Freq: Once | INTRAVENOUS | Status: AC | PRN
  Administered 2019-01-14: 500 [IU]
  Filled 2019-01-14: qty 5

## 2019-01-14 MED ORDER — SODIUM CHLORIDE 0.9% FLUSH
10.0000 mL | INTRAVENOUS | Status: DC | PRN
  Administered 2019-01-14: 10 mL via INTRAVENOUS
  Filled 2019-01-14: qty 10

## 2019-01-14 MED ORDER — DIPHENHYDRAMINE HCL 25 MG PO CAPS
ORAL_CAPSULE | ORAL | Status: AC
  Filled 2019-01-14: qty 2

## 2019-01-14 NOTE — Progress Notes (Signed)
[facility] Follow up:    [name], [address] [address]   DIAGNOSIS: Cancer Staging No matching staging information was found for the patient.  SUMMARY OF ONCOLOGIC HISTORY: Oncology History  Metastatic breast cancer (Monique Figueroa)  06/05/2006 Initial Diagnosis   Left breast biopsy: DCIS with apocrine features ER 30%, PR 13%   06/19/2006 Surgery   Left lumpectomy: stage I, pT1a, pN0(i)(sn), pMX, 0.3 cm IDC, grade 1, with extensive high-grade DCIS , margins negative ER 38 %, PR 93 %, Ki-67 10%, HER-2/neu 2+, no additional tissue available for FISH testing, with 0/1 left axillary lymph nodes.   07/30/2006 - 09/17/2006 Radiation Therapy   Adjuvant radiation therapy   09/24/2006 - 09/25/2011 Anti-estrogen oral therapy   Antiestrogen therapy with Arimidex 5 years   04/26/2016 Relapse/Recurrence   Innumerable hepatic lesions seen in both lobes most are 1-2 cm size somewhat confluent largest in the posterior right hepatic dome 3 x 4.6 cm   05/07/2016 PET scan   PET/CT scan: Extensive metastatic involvement of both lobes of the liver, primary could be liver, phalangeal or metastatic disease, hypermetabolic thoracic LN left axillary, left supraclavicular and right CP angle LN, hypermetabolic upper abdominal LN   05/16/2016 Initial Biopsy   Liver biopsy: Metastatic carcinoma breast primary strong positivity for CK 7 weak focal positivity for ER, GCDFP, negative for PR, CDX 2, TTF-1, WT 1; HER-2 positive   06/13/2016 - 11/07/2016 Chemotherapy   Taxotere Herceptin and Perjeta every 3 weeks (Taxotere was held for elevated LFTs for cycles 1 and 2)   11/28/2016 - 05/06/2018 Chemotherapy   Herceptin maintenance therapy for metastatic breast cancer every 3 weeks   03/24/2018 Surgery   Chronic cholecystitis: Diagnostic laparoscopy: Due to intermittent attacks of biliary colic: The gallbladder was rockhard and not distensible with tumor palpated in the porta  hepatis, extensive lucent liver lesions, decision was made to stop the surgery.   05/12/2018 - 05/22/2018 Radiation Therapy   Radiation to the brain   05/29/2018 -  Chemotherapy   Kadcyla     CURRENT THERAPY: Kadcyla  INTERVAL HISTORY: Monique Figueroa 85 y.o. female returns for evaluation prior to receiving kadcyla.  She is tolerating treatment well.  She denies appetite loss, pain.  She does note intermittent indigestion which is normal for her.  She notes that she is practicing social distancing due to [place] and has help from her sons.  She is widowed and lives alone.    Her most recent echocardiogram was on 12/03/2018 and showed an EF of 60-65%.  Her most recent restaging was in February and showed stable disease.  She is also due to undergo repeat brain MRI in 02/2019.   Patient Active Problem List   Diagnosis Date Noted  . Common bile duct (CBD) stricture 08/18/2018  . RUQ pain 08/18/2018  . History of ERCP 08/18/2018  . Brain metastases ([place]) 05/08/2018  . Bile duct obstruction 04/20/2018  . Biliary dyskinesia 03/24/2018  . Tuberculosis   . Port catheter in place 04/03/2017  . Infusion reaction 06/13/2016  . Metastases to the liver ([place]) 05/08/2016  . Metastatic breast cancer (Monique Figueroa) 06/23/2013    is allergic to perjeta [pertuzumab] and sulfa antibiotics.  MEDICAL HISTORY: Past Medical History:  Diagnosis Date  . Anemia   . Appendicitis   . Arthritis   . Biliary dyskinesia 03/24/2018  . Breast cancer (Monique Figueroa) 06/2006   Invasive ductal and DCIS of left breast  .  Breast tumor    History of bilateral breast tumors/cysts  . Cholecystitis   . Cyst of spinal meninges   . Diabetes mellitus without complication (Monique Figueroa)   . GERD (gastroesophageal reflux disease)   . Hypertension   . Hypothyroidism   . Metastatic breast cancer ([place]) 04/26/2016   mets to liver  . Thyroid cancer ([place])   . Tuberculosis    medullary carcinoma ; denies TB   . UTI (urinary tract infection)     hx    SURGICAL HISTORY: Past Surgical History:  Procedure Laterality Date  . APPENDECTOMY    . BALLOON DILATION N/A 04/20/2018   Procedure: BALLOON DILATION;  Surgeon: Rush Landmark Telford Nab., MD;  Location: [facility];  Service: Gastroenterology;  Laterality: N/A;  . BILIARY DILATION  10/14/2018   Procedure: BILIARY DILATION;  Surgeon: Rush Landmark Telford Nab., MD;  Location: [facility];  Service: Gastroenterology;;  . BILIARY STENT PLACEMENT  04/20/2018   Procedure: BILIARY STENT PLACEMENT;  Surgeon: Irving Copas., MD;  Location: [facility];  Service: Gastroenterology;;  . BILIARY STENT PLACEMENT N/A 10/14/2018   Procedure: BILIARY STENT PLACEMENT;  Surgeon: Irving Copas., MD;  Location: [facility];  Service: Gastroenterology;  Laterality: N/A;  . BIOPSY  10/14/2018   Procedure: BIOPSY;  Surgeon: Rush Landmark Telford Nab., MD;  Location: [facility];  Service: Gastroenterology;;  . BREAST CYST EXCISION Bilateral    Several asprirations and excisions  . BREAST LUMPECTOMY WITH AXILLARY LYMPH NODE BIOPSY Left 06/19/2006   Invasive ductal and in situ carcinoma, node negative  . CATARACT EXTRACTION, BILATERAL  2009  . CHOLECYSTECTOMY N/A 03/24/2018   Procedure: DIAGNOSTIC LAPAROSCOPY;  Surgeon: Fanny Skates, MD;  Location: [facility];  Service: General;  Laterality: N/A;  . ENDOSCOPIC RETROGRADE CHOLANGIOPANCREATOGRAPHY (ERCP) WITH PROPOFOL N/A 04/20/2018   Procedure: ENDOSCOPIC RETROGRADE CHOLANGIOPANCREATOGRAPHY (ERCP) WITH PROPOFOL ;  Surgeon: Irving Copas., MD;  Location: [facility];  Service: Gastroenterology;  Laterality: N/A;  . ENDOSCOPIC RETROGRADE CHOLANGIOPANCREATOGRAPHY (ERCP) WITH PROPOFOL N/A 10/14/2018   Procedure: ENDOSCOPIC RETROGRADE CHOLANGIOPANCREATOGRAPHY (ERCP) WITH PROPOFOL;  Surgeon: Rush Landmark Telford Nab., MD;  Location: [facility];  Service: Gastroenterology;  Laterality: N/A;  2 hour case  . PORTACATH PLACEMENT N/A 06/05/2016    Procedure: INSERTION PORT-A-CATH WITH [place];  Surgeon: Fanny Skates, MD;  Location: [facility];  Service: General;  Laterality: N/A;  . REMOVAL OF STONES  10/14/2018   Procedure: REMOVAL OF STONES;  Surgeon: Irving Copas., MD;  Location: [facility];  Service: Gastroenterology;;  . Joan Mayans  04/20/2018   Procedure: Joan Mayans;  Surgeon: Irving Copas., MD;  Location: [facility];  Service: Gastroenterology;;  . [place]    . STENT REMOVAL  10/14/2018   Procedure: STENT REMOVAL;  Surgeon: Irving Copas., MD;  Location: [facility];  Service: Gastroenterology;;  . TOTAL THYROIDECTOMY      SOCIAL HISTORY: Social History   Socioeconomic History  . Marital status: Widowed    Spouse name: Not on file  . Number of children: 1  . Years of education: Not on file  . Highest education level: Not on file  Occupational History  . Not on file  Social Needs  . Financial resource strain: Not on file  . Food insecurity    Worry: Not on file    Inability: Not on file  . Transportation needs    Medical: No    Non-medical: No  Tobacco Use  . Smoking status: Never Smoker  . Smokeless tobacco: Never Used  Substance  and Sexual Activity  . Alcohol use: No  . Drug use: No  . Sexual activity: Not Currently    Birth control/protection: Post-menopausal  Lifestyle  . Physical activity    Days per week: Not on file    Minutes per session: Not on file  . Stress: Not on file  Relationships  . Social Herbalist on phone: Not on file    Gets together: Not on file    Attends religious service: Not on file    Active member of club or organization: Not on file    Attends meetings of clubs or organizations: Not on file    Relationship status: Not on file  . Intimate partner violence    Fear of current or ex partner: No    Emotionally abused: No    Physically abused: No    Forced sexual activity: No  Other Topics Concern  . Not on  file  Social History Narrative  . Not on file    FAMILY HISTORY: Family History  Problem Relation Age of Onset  . Rheum arthritis Mother   . Coronary artery disease Mother   . Dementia Mother   . Diabetes Mother   . Heart Problems Father   . Cancer Sister        Breast cancer  . Cancer Brother        Lung cancer  . Diabetes Sister   . Dementia Sister   . Colon cancer Neg Hx   . Esophageal cancer Neg Hx   . Inflammatory bowel disease Neg Hx   . Liver disease Neg Hx   . Pancreatic cancer Neg Hx   . Rectal cancer Neg Hx   . Stomach cancer Neg Hx     Review of Systems  Constitutional: Negative for appetite change, chills, fatigue, fever and unexpected weight change.  HENT:   Negative for hearing loss, lump/mass, mouth sores, sore throat and trouble swallowing.   Eyes: Negative for eye problems and icterus.  Respiratory: Negative for chest tightness, cough and shortness of breath.   Cardiovascular: Negative for chest pain, leg swelling and palpitations.  Gastrointestinal: Negative for abdominal distention, abdominal pain, constipation, diarrhea, nausea and vomiting.  Endocrine: Negative for hot flashes.  Genitourinary: Negative for difficulty urinating.   Musculoskeletal: Negative for arthralgias.  Skin: Negative for itching and rash.  Neurological: Negative for dizziness, extremity weakness, headaches and numbness.  Hematological: Negative for adenopathy.  Psychiatric/Behavioral: Negative for depression. The patient is not nervous/anxious.       PHYSICAL EXAMINATION  ECOG PERFORMANCE STATUS: 1 - Symptomatic but completely ambulatory  Vitals:   01/14/19 0922  BP: (!) 141/78  Pulse: 89  Resp: 17  Temp: 99.1 F (37.3 C)  SpO2: 98%    Physical Exam Constitutional:      General: She is not in acute distress.    Appearance: Normal appearance. She is not toxic-appearing.  HENT:     Head: Normocephalic and atraumatic.     Nose: No congestion.     Mouth/Throat:      Mouth: Mucous membranes are moist.     Pharynx: Oropharynx is clear. No oropharyngeal exudate or posterior oropharyngeal erythema.  Eyes:     General: No scleral icterus.    Pupils: Pupils are equal, round, and reactive to light.  Neck:     Musculoskeletal: Neck supple.  Cardiovascular:     Rate and Rhythm: Normal rate and regular rhythm.     Pulses: Normal pulses.  Heart sounds: Normal heart sounds.  Pulmonary:     Effort: Pulmonary effort is normal.     Breath sounds: Normal breath sounds.  Abdominal:     General: Abdomen is flat. Bowel sounds are normal. There is no distension.     Palpations: Abdomen is soft.     Tenderness: There is no abdominal tenderness.  Musculoskeletal:        General: No swelling.  Lymphadenopathy:     Cervical: No cervical adenopathy.  Skin:    General: Skin is warm and dry.     Capillary Refill: Capillary refill takes less than 2 seconds.  Neurological:     General: No focal deficit present.     Mental Status: She is alert and oriented to person, place, and time.  Psychiatric:        Mood and Affect: Mood normal.        Behavior: Behavior normal.     LABORATORY DATA:  CBC    Component Value Date/Time   WBC 3.5 (L) 01/14/2019 0852   WBC 6.2 08/03/2018 0551   RBC 3.95 01/14/2019 0852   HGB 12.1 01/14/2019 0852   HGB 12.5 08/07/2017 0905   HCT 36.7 01/14/2019 0852   HCT 37.3 08/07/2017 0905   PLT 110 (L) 01/14/2019 0852   PLT 195 08/07/2017 0905   MCV 92.9 01/14/2019 0852   MCV 93.6 08/07/2017 0905   MCH 30.6 01/14/2019 0852   MCHC 33.0 01/14/2019 0852   RDW 15.9 (H) 01/14/2019 0852   RDW 13.2 08/07/2017 0905   LYMPHSABS 0.8 01/14/2019 0852   LYMPHSABS 1.1 08/07/2017 0905   MONOABS 0.5 01/14/2019 0852   MONOABS 0.4 08/07/2017 0905   EOSABS 0.1 01/14/2019 0852   EOSABS 0.1 08/07/2017 0905   BASOSABS 0.0 01/14/2019 0852   BASOSABS 0.0 08/07/2017 0905    CMP     Component Value Date/Time   NA 140 12/24/2018 0817   NA 139  08/07/2017 0905   K 3.3 (L) 12/24/2018 0817   K 3.7 08/07/2017 0905   CL 104 12/24/2018 0817   CL 104 06/09/2012 1402   CO2 26 12/24/2018 0817   CO2 27 08/07/2017 0905   GLUCOSE 93 12/24/2018 0817   GLUCOSE 102 08/07/2017 0905   GLUCOSE 153 (H) 06/09/2012 1402   BUN 9 12/24/2018 0817   BUN 14.7 08/07/2017 0905   CREATININE 0.69 12/24/2018 0817   CREATININE 0.8 08/07/2017 0905   CALCIUM 9.0 12/24/2018 0817   CALCIUM 9.3 08/07/2017 0905   PROT 6.6 12/24/2018 0817   PROT 7.3 08/07/2017 0905   ALBUMIN 3.2 (L) 12/24/2018 0817   ALBUMIN 3.9 08/07/2017 0905   AST 40 12/24/2018 0817   AST 22 08/07/2017 0905   ALT 17 12/24/2018 0817   ALT 14 08/07/2017 0905   ALKPHOS 67 12/24/2018 0817   ALKPHOS 68 08/07/2017 0905   BILITOT 1.1 12/24/2018 0817   BILITOT 0.56 08/07/2017 0905   GFRNONAA >60 12/24/2018 0817   GFRAA >60 12/24/2018 0817       ASSESSMENT and PLAN:   Metastatic breast cancer ([place]) Metastatic breast cancer with innumerable liver metastases detected on ultrasound and recent MRI of the liver on 04/26/2016 (Prior history of left breast IDC T1 1 N0 stage IA 0.3 cm grade 1 tumor that was ER 38%, PR 93%, Ki-67 10%, HER-2 2+, no tissue for FISH testing, 0/1 lymph node negative, status post lumpectomy radiation and 5 years of Arimidex completed in February 2013)  PET/CT scan: 05/07/2016: Extensive  metastatic involvement of both lobes of the liver, primary could be liver, phalangeal or metastatic disease, hypermetabolic thoracic lymph nodes left axillary, left supraclavicular and right CP angle lymph nodes, hypermetabolic upper abdominal lymph nodes  Liver biopsy10/07/2016: Metastatic carcinoma breast primary strong positivity for CK 7 weak focal positivity for ER, GCDFP, negative for PR, CDX 2, TTF-1, WT 1; HER-2 positive  Treatment plan: 1. Taxotere Herceptin and Perjeta every 3 weeks palliative chemotherapy started 06/13/16 (Perjeta discontinued after 5 cycles due to  allergy) 2. followed by Herceptin maintenance stopped 05/08/2018 due to progression, Kadcyla started -----------------------------------------------------------------------------------------------------------------------------------------  CT CAP 09/30/2018: Right hepatic lobe hypoperfused region 5 x 5.1 cm mildly contracted from 5.1 x 6.4 cm, additional nodules right and left lobes not changed in size.  Thrombosis of the right portal vein.   10/14/2018: ERCP with stent placement   Current treatment: Kadcyla starting 10/25/2019todayiscycle6 Patient completed palliative radiation therapy to the brain Echo 12/03/2018 shows EF of 60-65%  No clinical signs of metastatic breast cancer progression. Kadcyla toxicities: No side effects to Kadcyla.  Will continue. CBC reviewed with patient, CMET pending  Patient will be restaged with CT chest abdomen and pelvis prior to her follow up with Dr. [name] in 6 weeks.  She will also undergo brain MRI with brain tumor board discussion afterwards.    Due for echo at end of July/early August, this has been ordered as well.   Continue with current treatment with Kadcyla and follow-up with myself/Dr [name] every other treatment.    Orders Placed This Encounter  Procedures  . CT Abdomen Pelvis W Contrast    Standing Status:   Future    Standing Expiration Date:   01/14/2020    Order Specific Question:   If indicated for the ordered procedure, I authorize the administration of contrast media per Radiology protocol    Answer:   Yes    Order Specific Question:   Preferred imaging location?    Answer:   [facility]    Order Specific Question:   Is Oral Contrast requested for this exam?    Answer:   Yes, Per Radiology protocol    Order Specific Question:   Radiology Contrast Protocol - do NOT remove file path    Answer:   _0 [email or web address]  . CT Chest W Contrast    Standing Status:   Future    Standing  Expiration Date:   01/14/2020    Order Specific Question:   If indicated for the ordered procedure, I authorize the administration of contrast media per Radiology protocol    Answer:   Yes    Order Specific Question:   Preferred imaging location?    Answer:   [facility]    Order Specific Question:   Radiology Contrast Protocol - do NOT remove file path    Answer:   _1 [email or web address]  . ECHOCARDIOGRAM COMPLETE    Standing Status:   Future    Standing Expiration Date:   04/15/2020    Order Specific Question:   Where should this test be performed    Answer:   [place]    Order Specific Question:   Perflutren DEFINITY (image enhancing agent) should be administered unless hypersensitivity or allergy exist    Answer:   Administer Perflutren    Order Specific Question:   Reason for exam-Echo    Answer:   Chemotherapy evaluation  v87.41 / v58.11    All questions were answered. The patient knows to call the clinic with  any problems, questions or concerns. We can certainly see the patient much sooner if necessary.  A total of (30) minutes of face-to-face time was spent with this patient with greater than 50% of that time in counseling and care-coordination.  This note was electronically signed. Scot Dock, NP 01/14/2019

## 2019-01-14 NOTE — Patient Instructions (Signed)
[facility] Discharge Instructions for Patients Receiving Chemotherapy  Today you received the following chemotherapy agents Kadcyla  To help prevent nausea and vomiting after your treatment, we encourage you to take your nausea medication as directed   If you develop nausea and vomiting that is not controlled by your nausea medication, call the clinic.   BELOW ARE SYMPTOMS THAT SHOULD BE REPORTED IMMEDIATELY:  *FEVER GREATER THAN 100.5 F  *CHILLS WITH OR WITHOUT FEVER  NAUSEA AND VOMITING THAT IS NOT CONTROLLED WITH YOUR NAUSEA MEDICATION  *UNUSUAL SHORTNESS OF BREATH  *UNUSUAL BRUISING OR BLEEDING  TENDERNESS IN MOUTH AND THROAT WITH OR WITHOUT PRESENCE OF ULCERS  *URINARY PROBLEMS  *BOWEL PROBLEMS  UNUSUAL RASH Items with * indicate a potential emergency and should be followed up as soon as possible.  Feel free to call the clinic should you have any questions or concerns. The clinic phone number is [phone].  Please show the CHEMO ALERT CARD at check-in to the Emergency Department and triage nurse.   

## 2019-01-14 NOTE — Assessment & Plan Note (Addendum)
Metastatic breast cancer with innumerable liver metastases detected on ultrasound and recent MRI of the liver on 04/26/2016 (Prior history of left breast IDC T1 1 N0 stage IA 0.3 cm grade 1 tumor that was ER 38%, PR 93%, Ki-67 10%, HER-2 2+, no tissue for FISH testing, 0/1 lymph node negative, status post lumpectomy radiation and 5 years of Arimidex completed in February 2013)  PET/CT scan: 05/07/2016: Extensive metastatic involvement of both lobes of the liver, primary could be liver, phalangeal or metastatic disease, hypermetabolic thoracic lymph nodes left axillary, left supraclavicular and right CP angle lymph nodes, hypermetabolic upper abdominal lymph nodes  Liver biopsy10/07/2016: Metastatic carcinoma breast primary strong positivity for CK 7 weak focal positivity for ER, GCDFP, negative for PR, CDX 2, TTF-1, WT 1; HER-2 positive  Treatment plan: 1. Taxotere Herceptin and Perjeta every 3 weeks palliative chemotherapy started 06/13/16 (Perjeta discontinued after 5 cycles due to allergy) 2. followed by Herceptin maintenance stopped 05/08/2018 due to progression, Kadcyla started -----------------------------------------------------------------------------------------------------------------------------------------  CT CAP 09/30/2018: Right hepatic lobe hypoperfused region 5 x 5.1 cm mildly contracted from 5.1 x 6.4 cm, additional nodules right and left lobes not changed in size.  Thrombosis of the right portal vein.   10/14/2018: ERCP with stent placement   Current treatment: Kadcyla starting 10/25/2019todayiscycle6 Patient completed palliative radiation therapy to the brain Echo 12/03/2018 shows EF of 60-65%  No clinical signs of metastatic breast cancer progression. Kadcyla toxicities: No side effects to Kadcyla.  Will continue. CBC reviewed with patient, CMET pending  Patient will be restaged with CT chest abdomen and pelvis prior to her follow up with Dr. [name] in 6 weeks.   She will also undergo brain MRI with brain tumor board discussion afterwards.    Due for echo at end of July/early August, this has been ordered as well.   Continue with current treatment with Kadcyla and follow-up with myself/Dr [name] every other treatment.

## 2019-01-23 ENCOUNTER — Other Ambulatory Visit: Payer: Self-pay | Admitting: Hematology and Oncology

## 2019-02-04 ENCOUNTER — Inpatient Hospital Stay: Payer: Medicare Other

## 2019-02-04 ENCOUNTER — Other Ambulatory Visit: Payer: Self-pay

## 2019-02-04 ENCOUNTER — Inpatient Hospital Stay: Payer: Medicare Other | Attending: Hematology and Oncology

## 2019-02-04 VITALS — BP 147/69 | HR 67 | Temp 98.4°F | Resp 16 | Ht 65.0 in | Wt 145.0 lb

## 2019-02-04 DIAGNOSIS — Z7984 Long term (current) use of oral hypoglycemic drugs: Secondary | ICD-10-CM | POA: Diagnosis not present

## 2019-02-04 DIAGNOSIS — C50912 Malignant neoplasm of unspecified site of left female breast: Secondary | ICD-10-CM | POA: Diagnosis not present

## 2019-02-04 DIAGNOSIS — C787 Secondary malignant neoplasm of liver and intrahepatic bile duct: Secondary | ICD-10-CM | POA: Diagnosis not present

## 2019-02-04 DIAGNOSIS — Z170 Estrogen receptor positive status [ER+]: Secondary | ICD-10-CM | POA: Insufficient documentation

## 2019-02-04 DIAGNOSIS — Z923 Personal history of irradiation: Secondary | ICD-10-CM | POA: Insufficient documentation

## 2019-02-04 DIAGNOSIS — Z79899 Other long term (current) drug therapy: Secondary | ICD-10-CM | POA: Insufficient documentation

## 2019-02-04 DIAGNOSIS — C50919 Malignant neoplasm of unspecified site of unspecified female breast: Secondary | ICD-10-CM

## 2019-02-04 DIAGNOSIS — Z9221 Personal history of antineoplastic chemotherapy: Secondary | ICD-10-CM | POA: Diagnosis not present

## 2019-02-04 DIAGNOSIS — Z5112 Encounter for antineoplastic immunotherapy: Secondary | ICD-10-CM | POA: Diagnosis not present

## 2019-02-04 DIAGNOSIS — C773 Secondary and unspecified malignant neoplasm of axilla and upper limb lymph nodes: Secondary | ICD-10-CM | POA: Diagnosis not present

## 2019-02-04 DIAGNOSIS — C7931 Secondary malignant neoplasm of brain: Secondary | ICD-10-CM | POA: Insufficient documentation

## 2019-02-04 DIAGNOSIS — Z95828 Presence of other vascular implants and grafts: Secondary | ICD-10-CM

## 2019-02-04 LAB — CBC WITH DIFFERENTIAL (CANCER CENTER ONLY)
Abs Immature Granulocytes: 0.01 10*3/uL (ref 0.00–0.07)
Basophils Absolute: 0 10*3/uL (ref 0.0–0.1)
Basophils Relative: 1 %
Eosinophils Absolute: 0.1 10*3/uL (ref 0.0–0.5)
Eosinophils Relative: 3 %
HCT: 37.1 % (ref 36.0–46.0)
Hemoglobin: 12.4 g/dL (ref 12.0–15.0)
Immature Granulocytes: 0 %
Lymphocytes Relative: 21 %
Lymphs Abs: 0.7 10*3/uL (ref 0.7–4.0)
MCH: 30.5 pg (ref 26.0–34.0)
MCHC: 33.4 g/dL (ref 30.0–36.0)
MCV: 91.2 fL (ref 80.0–100.0)
Monocytes Absolute: 0.5 10*3/uL (ref 0.1–1.0)
Monocytes Relative: 14 %
Neutro Abs: 2 10*3/uL (ref 1.7–7.7)
Neutrophils Relative %: 61 %
Platelet Count: 117 10*3/uL — ABNORMAL LOW (ref 150–400)
RBC: 4.07 MIL/uL (ref 3.87–5.11)
RDW: 14.9 % (ref 11.5–15.5)
WBC Count: 3.2 10*3/uL — ABNORMAL LOW (ref 4.0–10.5)
nRBC: 0 % (ref 0.0–0.2)

## 2019-02-04 LAB — CMP (CANCER CENTER ONLY)
ALT: 16 U/L (ref 0–44)
AST: 42 U/L — ABNORMAL HIGH (ref 15–41)
Albumin: 3.1 g/dL — ABNORMAL LOW (ref 3.5–5.0)
Alkaline Phosphatase: 81 U/L (ref 38–126)
Anion gap: 11 (ref 5–15)
BUN: 11 mg/dL (ref 8–23)
CO2: 24 mmol/L (ref 22–32)
Calcium: 8.4 mg/dL — ABNORMAL LOW (ref 8.9–10.3)
Chloride: 106 mmol/L (ref 98–111)
Creatinine: 0.67 mg/dL (ref 0.44–1.00)
GFR, Est AFR Am: >60 mL/min (ref >60)
GFR, Estimated: >60 mL/min (ref >60)
Glucose, Bld: 97 mg/dL (ref 70–99)
Potassium: 3.6 mmol/L (ref 3.5–5.1)
Sodium: 141 mmol/L (ref 135–145)
Total Bilirubin: 0.7 mg/dL (ref 0.3–1.2)
Total Protein: 6.4 g/dL — ABNORMAL LOW (ref 6.5–8.1)

## 2019-02-04 MED ORDER — DIPHENHYDRAMINE HCL 25 MG PO CAPS
50.0000 mg | ORAL_CAPSULE | Freq: Once | ORAL | Status: AC
  Administered 2019-02-04: 50 mg via ORAL

## 2019-02-04 MED ORDER — ACETAMINOPHEN 325 MG PO TABS
ORAL_TABLET | ORAL | Status: AC
  Filled 2019-02-04: qty 2

## 2019-02-04 MED ORDER — SODIUM CHLORIDE 0.9 % IV SOLN
3.0000 mg/kg | Freq: Once | INTRAVENOUS | Status: AC
  Administered 2019-02-04: 200 mg via INTRAVENOUS
  Filled 2019-02-04: qty 10

## 2019-02-04 MED ORDER — SODIUM CHLORIDE 0.9% FLUSH
10.0000 mL | INTRAVENOUS | Status: DC | PRN
  Administered 2019-02-04: 10 mL
  Filled 2019-02-04: qty 10

## 2019-02-04 MED ORDER — SODIUM CHLORIDE 0.9 % IV SOLN
Freq: Once | INTRAVENOUS | Status: AC
  Administered 2019-02-04: 10:00:00 via INTRAVENOUS
  Filled 2019-02-04: qty 250

## 2019-02-04 MED ORDER — HEPARIN SOD (PORK) LOCK FLUSH 100 UNIT/ML IV SOLN
500.0000 [IU] | Freq: Once | INTRAVENOUS | Status: AC | PRN
  Administered 2019-02-04: 500 [IU]
  Filled 2019-02-04: qty 5

## 2019-02-04 MED ORDER — DIPHENHYDRAMINE HCL 25 MG PO CAPS
ORAL_CAPSULE | ORAL | Status: AC
  Filled 2019-02-04: qty 2

## 2019-02-04 MED ORDER — LEVOTHYROXINE SODIUM 125 MCG PO TABS: 125.0000 ug | ORAL_TABLET | Freq: Every day | ORAL | 0 refills | Status: AC

## 2019-02-04 MED ORDER — ACETAMINOPHEN 325 MG PO TABS
650.0000 mg | ORAL_TABLET | Freq: Once | ORAL | Status: AC
  Administered 2019-02-04: 650 mg via ORAL

## 2019-02-04 MED ORDER — SODIUM CHLORIDE 0.9% FLUSH
10.0000 mL | INTRAVENOUS | Status: DC | PRN
  Administered 2019-02-04: 10 mL via INTRAVENOUS
  Filled 2019-02-04: qty 10

## 2019-02-04 NOTE — Patient Instructions (Signed)
[facility] Discharge Instructions for Patients Receiving Chemotherapy  Today you received the following chemotherapy agents Kadcyla  To help prevent nausea and vomiting after your treatment, we encourage you to take your nausea medication as directed   If you develop nausea and vomiting that is not controlled by your nausea medication, call the clinic.   BELOW ARE SYMPTOMS THAT SHOULD BE REPORTED IMMEDIATELY:  *FEVER GREATER THAN 100.5 F  *CHILLS WITH OR WITHOUT FEVER  NAUSEA AND VOMITING THAT IS NOT CONTROLLED WITH YOUR NAUSEA MEDICATION  *UNUSUAL SHORTNESS OF BREATH  *UNUSUAL BRUISING OR BLEEDING  TENDERNESS IN MOUTH AND THROAT WITH OR WITHOUT PRESENCE OF ULCERS  *URINARY PROBLEMS  *BOWEL PROBLEMS  UNUSUAL RASH Items with * indicate a potential emergency and should be followed up as soon as possible.  Feel free to call the clinic should you have any questions or concerns. The clinic phone number is [phone].  Please show the CHEMO ALERT CARD at check-in to the Emergency Department and triage nurse.   

## 2019-02-04 NOTE — Patient Instructions (Signed)

## 2019-02-08 DIAGNOSIS — E039 Hypothyroidism, unspecified: Secondary | ICD-10-CM | POA: Diagnosis not present

## 2019-02-08 DIAGNOSIS — E7800 Pure hypercholesterolemia, unspecified: Secondary | ICD-10-CM | POA: Diagnosis not present

## 2019-02-08 DIAGNOSIS — E118 Type 2 diabetes mellitus with unspecified complications: Secondary | ICD-10-CM | POA: Diagnosis not present

## 2019-02-08 DIAGNOSIS — I10 Essential (primary) hypertension: Secondary | ICD-10-CM | POA: Diagnosis not present

## 2019-02-17 ENCOUNTER — Other Ambulatory Visit: Payer: Self-pay | Admitting: Radiation Therapy

## 2019-02-18 ENCOUNTER — Other Ambulatory Visit: Payer: Self-pay

## 2019-02-18 ENCOUNTER — Ambulatory Visit (HOSPITAL_COMMUNITY)
Admission: RE | Admit: 2019-02-18 | Discharge: 2019-02-18 | Disposition: A | Discharge status: Home / Self Care | Payer: Medicare Other | Source: Ambulatory Visit | Attending: Radiation Oncology | Admitting: Radiation Oncology

## 2019-02-18 DIAGNOSIS — C7931 Secondary malignant neoplasm of brain: Secondary | ICD-10-CM

## 2019-02-18 DIAGNOSIS — C7949 Secondary malignant neoplasm of other parts of nervous system: Secondary | ICD-10-CM | POA: Insufficient documentation

## 2019-02-18 DIAGNOSIS — C801 Malignant (primary) neoplasm, unspecified: Secondary | ICD-10-CM | POA: Diagnosis not present

## 2019-02-18 DIAGNOSIS — I6782 Cerebral ischemia: Secondary | ICD-10-CM | POA: Diagnosis not present

## 2019-02-18 DIAGNOSIS — R9082 White matter disease, unspecified: Secondary | ICD-10-CM | POA: Diagnosis not present

## 2019-02-18 DIAGNOSIS — G9389 Other specified disorders of brain: Secondary | ICD-10-CM | POA: Diagnosis not present

## 2019-02-18 MED ORDER — GADOBUTROL 1 MMOL/ML IV SOLN
6.0000 mL | Freq: Once | INTRAVENOUS | Status: AC | PRN
  Administered 2019-02-18: 6 mL via INTRAVENOUS

## 2019-02-19 NOTE — Assessment & Plan Note (Signed)
Metastatic breast cancer with innumerable liver metastases detected on ultrasound and recent MRI of the liver on 04/26/2016 (Prior history of left breast IDC T1 1 N0 stage IA 0.3 cm grade 1 tumor that was ER 38%, PR 93%, Ki-67 10%, HER-2 2+, no tissue for FISH testing, 0/1 lymph node negative, status post lumpectomy radiation and 5 years of Arimidex completed in February 2013)  PET/CT scan: 05/07/2016: Extensive metastatic involvement of both lobes of the liver, primary could be liver, phalangeal or metastatic disease, hypermetabolic thoracic lymph nodes left axillary, left supraclavicular and right CP angle lymph nodes, hypermetabolic upper abdominal lymph nodes  Liver biopsy10/07/2016: Metastatic carcinoma breast primary strong positivity for CK 7 weak focal positivity for ER, GCDFP, negative for PR, CDX 2, TTF-1, WT 1; HER-2 positive  Treatment plan: 1. Taxotere Herceptin and Perjeta every 3 weeks palliative chemotherapy started 06/13/16 (Perjeta discontinued after 5 cycles due to allergy) 2. followed by Herceptin maintenance stopped 05/08/2018 due to progression, Kadcyla started -----------------------------------------------------------------------------------------------------------------------------------------  CT CAP 09/30/2018:Right hepatic lobe hypoperfused region 5 x 5.1 cm mildly contracted from 5.1 x 6.4 cm, additional nodules right and left lobes not changed in size. Thrombosis of the right portal vein.  10/14/2018: ERCP with stent placement   Current treatment: Kadcyla starting 10/25/2019todayiscycle7 Patient completed palliative radiation therapy to the brain Echo 12/03/2018 shows EF of 60-65%  No clinical signs of metastatic breast cancer progression. Kadcyla toxicities: No side effects to Kadcyla.  Will continue.  CT CAP: 02/22/2019:

## 2019-02-19 NOTE — Progress Notes (Signed)
Monique Figueroa, please set-up SRS 

## 2019-02-22 ENCOUNTER — Inpatient Hospital Stay: Payer: Medicare Other

## 2019-02-22 ENCOUNTER — Ambulatory Visit (HOSPITAL_COMMUNITY)
Admission: RE | Admit: 2019-02-22 | Discharge: 2019-02-22 | Disposition: A | Discharge status: Home / Self Care | Payer: Medicare Other | Source: Ambulatory Visit | Attending: Adult Health | Admitting: Adult Health

## 2019-02-22 ENCOUNTER — Other Ambulatory Visit: Payer: Self-pay

## 2019-02-22 DIAGNOSIS — C50919 Malignant neoplasm of unspecified site of unspecified female breast: Secondary | ICD-10-CM | POA: Diagnosis not present

## 2019-02-22 DIAGNOSIS — C787 Secondary malignant neoplasm of liver and intrahepatic bile duct: Secondary | ICD-10-CM | POA: Diagnosis not present

## 2019-02-22 DIAGNOSIS — I700 Atherosclerosis of aorta: Secondary | ICD-10-CM | POA: Diagnosis not present

## 2019-02-22 MED ORDER — SODIUM CHLORIDE (PF) 0.9 % IJ SOLN
INTRAMUSCULAR | Status: AC
  Filled 2019-02-22: qty 50

## 2019-02-22 MED ORDER — HEPARIN SOD (PORK) LOCK FLUSH 100 UNIT/ML IV SOLN
500.0000 [IU] | Freq: Once | INTRAVENOUS | Status: AC
  Administered 2019-02-22: 09:00:00 500 [IU] via INTRAVENOUS

## 2019-02-22 MED ORDER — HEPARIN SOD (PORK) LOCK FLUSH 100 UNIT/ML IV SOLN
INTRAVENOUS | Status: AC
  Administered 2019-02-22: 500 [IU] via INTRAVENOUS
  Filled 2019-02-22: qty 5

## 2019-02-22 MED ORDER — IOHEXOL 300 MG/ML  SOLN
100.0000 mL | Freq: Once | INTRAMUSCULAR | Status: AC | PRN
  Administered 2019-02-22: 09:00:00 100 mL via INTRAVENOUS

## 2019-02-24 ENCOUNTER — Ambulatory Visit: Payer: Medicare Other | Admitting: Urology

## 2019-02-24 NOTE — Progress Notes (Signed)
Patient Care Team: [name], MD as PCP - General (Internal Medicine)  DIAGNOSIS:    ICD-10-CM   1. Metastatic breast cancer ([place])  C50.919     SUMMARY OF ONCOLOGIC HISTORY: Oncology History  Metastatic breast cancer ([place])  06/05/2006 Initial Diagnosis   Left breast biopsy: DCIS with apocrine features ER 30%, PR 13%   06/19/2006 Surgery   Left lumpectomy: stage I, pT1a, pN0(i)(sn), pMX, 0.3 cm IDC, grade 1, with extensive high-grade DCIS , margins negative ER 38 %, PR 93 %, Ki-67 10%, HER-2/neu 2+, no additional tissue available for FISH testing, with 0/1 left axillary lymph nodes.   07/30/2006 - 09/17/2006 Radiation Therapy   Adjuvant radiation therapy   09/24/2006 - 09/25/2011 Anti-estrogen oral therapy   Antiestrogen therapy with Arimidex 5 years   04/26/2016 Relapse/Recurrence   Innumerable hepatic lesions seen in both lobes most are 1-2 cm size somewhat confluent largest in the posterior right hepatic dome 3 x 4.6 cm   05/07/2016 PET scan   PET/CT scan: Extensive metastatic involvement of both lobes of the liver, primary could be liver, phalangeal or metastatic disease, hypermetabolic thoracic LN left axillary, left supraclavicular and right CP angle LN, hypermetabolic upper abdominal LN   05/16/2016 Initial Biopsy   Liver biopsy: Metastatic carcinoma breast primary strong positivity for CK 7 weak focal positivity for ER, GCDFP, negative for PR, CDX 2, TTF-1, WT 1; HER-2 positive   06/13/2016 - 11/07/2016 Chemotherapy   Taxotere Herceptin and Perjeta every 3 weeks (Taxotere was held for elevated LFTs for cycles 1 and 2)   11/28/2016 - 05/06/2018 Chemotherapy   Herceptin maintenance therapy for metastatic breast cancer every 3 weeks   03/24/2018 Surgery   Chronic cholecystitis: Diagnostic laparoscopy: Due to intermittent attacks of biliary colic: The gallbladder was rockhard and not distensible with tumor palpated in the porta hepatis, extensive lucent liver lesions, decision was  made to stop the surgery.   05/12/2018 - 05/22/2018 Radiation Therapy   Radiation to the brain   05/29/2018 -  Chemotherapy   Kadcyla     CHIEF COMPLIANT: Follow-up of metastatic breast cancer on Kadcyla to review recent scans  INTERVAL HISTORY: Monique Figueroa is a 85 y.o. with above-mentioned history of metastatic breast cancer with brain metastasis currently on Kadcyla. Echo on 12/03/18 show an ejection fraction in the range of 60-65%.Brain MRI on 02/18/19 showed a new 40m metastasis in the left posterior frontal vertex and favorable evolution of the treated cerebellar disease. CT CAP on 02/22/19 showed slight interval decrease in the right hepatic lobe liver metastasis and all other lesions were stable. There was no other evidence of metastatic disease. She presents to the clinic alone today for treatment and to review her scans.   REVIEW OF SYSTEMS:   Constitutional: Denies fevers, chills or abnormal weight loss Eyes: Denies blurriness of vision Ears, nose, mouth, throat, and face: Denies mucositis or sore throat Respiratory: Denies cough, dyspnea or wheezes Cardiovascular: Denies palpitation, chest discomfort Gastrointestinal: Denies nausea, heartburn or change in bowel habits Skin: Denies abnormal skin rashes Lymphatics: Denies new lymphadenopathy or easy bruising Neurological: Denies numbness, tingling or new weaknesses Behavioral/Psych: Mood is stable, no new changes  Extremities: No lower extremity edema Breast: denies any pain or lumps or nodules in either breasts All other systems were reviewed with the patient and are negative.  I have reviewed the past medical history, past surgical history, social history and family history with the patient and they are unchanged from previous note.  ALLERGIES:  is allergic to perjeta [pertuzumab] and sulfa antibiotics.  MEDICATIONS:  Current Outpatient Medications  Medication Sig Dispense Refill  . acetaminophen (TYLENOL) 500 MG  tablet Take 500 mg by mouth 2 (two) times daily as needed for moderate pain or headache.    . benzocaine (ORAJEL) 10 % mucosal gel Use as directed 1 application in the mouth or throat as needed for mouth pain.    . calcium carbonate (TUMS - DOSED IN MG ELEMENTAL CALCIUM) 500 MG chewable tablet Chew 1 tablet by mouth 2 (two) times daily as needed for indigestion or heartburn.     . ciprofloxacin (CIPRO) 500 MG tablet     . diphenhydrAMINE (BENADRYL) 25 MG tablet Take 25 mg by mouth daily as needed for allergies.    . hydrochlorothiazide (HYDRODIURIL) 25 MG tablet Take 25 mg by mouth daily.    Marland Kitchen levothyroxine (SYNTHROID) 125 MCG tablet Take 1 tablet (125 mcg total) by mouth daily before breakfast. 30 tablet 0  . Liniments (BLUE-EMU SUPER STRENGTH EX) Apply 1 application topically daily as needed (muscle pain).    . metFORMIN (GLUCOPHAGE) 500 MG tablet Take 500 mg by mouth daily with breakfast.     . omeprazole (PRILOSEC OTC) 20 MG tablet Take 20 mg by mouth daily as needed (acid reflux).     . ondansetron (ZOFRAN-ODT) 8 MG disintegrating tablet TAKE 1 TABLET (8 MG TOTAL) BY MOUTH EVERY 8 (EIGHT) HOURS AS NEEDED FOR NAUSEA OR VOMITING. 20 tablet 1  . ONDANSETRON HCL PO     . oxyCODONE-acetaminophen (PERCOCET/ROXICET) 5-325 MG tablet Take 1 tablet by mouth daily as needed for severe pain.    Marland Kitchen Phenazopyridine HCl (AZO-STANDARD PO) Take 1 tablet by mouth 2 (two) times daily as needed (uti symptoms).    . potassium chloride (MICRO-K) 10 MEQ CR capsule TAKE 1 CAPSULE (10 MEQ TOTAL) BY MOUTH 2 (TWO) TIMES DAILY. 60 capsule 0  . prochlorperazine (COMPAZINE) 10 MG tablet Take 1 tablet (10 mg total) by mouth every 6 (six) hours as needed for nausea or vomiting. 30 tablet 6  . Propylene Glycol (SYSTANE COMPLETE) 0.6 % SOLN Place 1 drop into both eyes 2 (two) times daily.    . sodium chloride (OCEAN) 0.65 % SOLN nasal spray Place 1 spray into both nostrils as needed for congestion.     No current  facility-administered medications for this visit.    Facility-Administered Medications Ordered in Other Visits  Medication Dose Route Frequency Provider Last Rate Last Dose  . sodium chloride flush (NS) 0.9 % injection 10 mL  10 mL Intravenous PRN Nicholas Lose, MD   10 mL at 02/25/19 1310    PHYSICAL EXAMINATION: ECOG PERFORMANCE STATUS: 1 - Symptomatic but completely ambulatory  There were no vitals filed for this visit. There were no vitals filed for this visit.  GENERAL: alert, no distress and comfortable SKIN: skin color, texture, turgor are normal, no rashes or significant lesions EYES: normal, Conjunctiva are pink and non-injected, sclera clear OROPHARYNX: no exudate, no erythema and lips, buccal mucosa, and tongue normal  NECK: supple, thyroid normal size, non-tender, without nodularity LYMPH: no palpable lymphadenopathy in the cervical, axillary or inguinal LUNGS: clear to auscultation and percussion with normal breathing effort HEART: regular rate & rhythm and no murmurs and no lower extremity edema ABDOMEN: abdomen soft, non-tender and normal bowel sounds MUSCULOSKELETAL: no cyanosis of digits and no clubbing  NEURO: alert & oriented x 3 with fluent speech, no focal motor/sensory deficits EXTREMITIES: No  lower extremity edema  LABORATORY DATA:  I have reviewed the data as listed CMP Latest Ref Rng & Units 02/04/2019 01/14/2019 12/24/2018  Glucose 70 - 99 mg/dL 97 98 93  BUN 8 - 23 mg/dL _0 Creatinine 0.44 - 1.00 mg/dL 0.67 0.70 0.69  Sodium 135 - 145 mmol/L 141 141 140  Potassium 3.5 - 5.1 mmol/L 3.6 3.5 3.3(L)  Chloride 98 - 111 mmol/L 106 107 104  CO2 22 - 32 mmol/L _1 Calcium 8.9 - 10.3 mg/dL 8.4(L) 8.3(L) 9.0  Total Protein 6.5 - 8.1 g/dL 6.4(L) 6.4(L) 6.6  Total Bilirubin 0.3 - 1.2 mg/dL 0.7 0.7 1.1  Alkaline Phos 38 - 126 U/L 81 78 67  AST 15 - 41 U/L 42(H) 36 40  ALT 0 - 44 U/L _2 Lab Results  Component Value Date   WBC 4.4 02/25/2019    HGB 13.1 02/25/2019   HCT 39.3 02/25/2019   MCV 92.3 02/25/2019   PLT 116 (L) 02/25/2019   NEUTROABS 2.9 02/25/2019    ASSESSMENT & PLAN:  Metastatic breast cancer ([place]) Metastatic breast cancer with innumerable liver metastases detected on ultrasound and recent MRI of the liver on 04/26/2016 (Prior history of left breast IDC T1 1 N0 stage IA 0.3 cm grade 1 tumor that was ER 38%, PR 93%, Ki-67 10%, HER-2 2+, no tissue for FISH testing, 0/1 lymph node negative, status post lumpectomy radiation and 5 years of Arimidex completed in February 2013)  PET/CT scan: 05/07/2016: Extensive metastatic involvement of both lobes of the liver, primary could be liver, phalangeal or metastatic disease, hypermetabolic thoracic lymph nodes left axillary, left supraclavicular and right CP angle lymph nodes, hypermetabolic upper abdominal lymph nodes  Liver biopsy10/07/2016: Metastatic carcinoma breast primary strong positivity for CK 7 weak focal positivity for ER, GCDFP, negative for PR, CDX 2, TTF-1, WT 1; HER-2 positive  Treatment plan: 1. Taxotere Herceptin and Perjeta every 3 weeks palliative chemotherapy started 06/13/16 (Perjeta discontinued after 5 cycles due to allergy) 2. followed by Herceptin maintenance stopped 05/08/2018 due to progression, Kadcyla started -----------------------------------------------------------------------------------------------------------------------------------------  CT CAP 09/30/2018:Right hepatic lobe hypoperfused region 5 x 5.1 cm mildly contracted from 5.1 x 6.4 cm, additional nodules right and left lobes not changed in size. Thrombosis of the right portal vein.  10/14/2018: ERCP with stent placement   Current treatment: Kadcyla starting 10/25/2019todayiscycle14 Patient completed palliative radiation therapy to the brain Echo 12/03/2018 shows EF of 60-65%  No clinical signs of metastatic breast cancer progression. Kadcyla toxicities: No side  effects to Kadcyla.  Will continue.  CT CAP: 02/22/2019: Slight interval decrease in the size of the liver metastases 5.4 cm to 5.1 cm, additional nodules stable Brain MRI 02/18/2019: New 4 mm metastases left posterior frontal vertex, favorable evaluation of treated cerebellar disease Patient is going to get stereotactic radiosurgery for the new lesion  Since she is responding very well up with systemic therapy, will continue Kadcyla every 3 weeks. I will see her every 6 weeks for follow-ups.  No orders of the defined types were placed in this encounter.  The patient has a good understanding of the overall plan. she agrees with it. she will call with any problems that may develop before the next visit here.  Nicholas Lose, MD 02/25/2019  Monique Figueroa am acting as scribe for Dr. Nicholas Lose.  I have reviewed the above documentation for accuracy and completeness, and I agree with the above.

## 2019-02-25 ENCOUNTER — Inpatient Hospital Stay: Payer: Medicare Other

## 2019-02-25 ENCOUNTER — Inpatient Hospital Stay: Payer: Medicare Other | Admitting: Hematology and Oncology

## 2019-02-25 ENCOUNTER — Other Ambulatory Visit: Payer: Self-pay

## 2019-02-25 ENCOUNTER — Other Ambulatory Visit: Payer: Self-pay | Admitting: Hematology and Oncology

## 2019-02-25 ENCOUNTER — Ambulatory Visit
Admission: RE | Admit: 2019-02-25 | Discharge: 2019-02-25 | Disposition: A | Discharge status: Home / Self Care | Payer: Medicare Other | Source: Ambulatory Visit | Attending: Radiation Oncology | Admitting: Radiation Oncology

## 2019-02-25 ENCOUNTER — Other Ambulatory Visit: Payer: Self-pay | Admitting: Radiation Therapy

## 2019-02-25 ENCOUNTER — Ambulatory Visit: Payer: Medicare Other | Attending: Radiation Oncology

## 2019-02-25 VITALS — BP 124/68 | HR 83 | Temp 98.6°F | Resp 20 | Ht 65.0 in | Wt 147.0 lb

## 2019-02-25 VITALS — BP 102/85 | HR 86 | Temp 97.8°F | Resp 16 | Ht 65.0 in | Wt 145.5 lb

## 2019-02-25 DIAGNOSIS — I700 Atherosclerosis of aorta: Secondary | ICD-10-CM | POA: Diagnosis not present

## 2019-02-25 DIAGNOSIS — C7931 Secondary malignant neoplasm of brain: Secondary | ICD-10-CM | POA: Diagnosis not present

## 2019-02-25 DIAGNOSIS — C50912 Malignant neoplasm of unspecified site of left female breast: Secondary | ICD-10-CM | POA: Diagnosis not present

## 2019-02-25 DIAGNOSIS — C773 Secondary and unspecified malignant neoplasm of axilla and upper limb lymph nodes: Secondary | ICD-10-CM | POA: Diagnosis not present

## 2019-02-25 DIAGNOSIS — Z853 Personal history of malignant neoplasm of breast: Secondary | ICD-10-CM | POA: Diagnosis not present

## 2019-02-25 DIAGNOSIS — Z923 Personal history of irradiation: Secondary | ICD-10-CM | POA: Diagnosis not present

## 2019-02-25 DIAGNOSIS — Z79899 Other long term (current) drug therapy: Secondary | ICD-10-CM | POA: Insufficient documentation

## 2019-02-25 DIAGNOSIS — C50919 Malignant neoplasm of unspecified site of unspecified female breast: Secondary | ICD-10-CM

## 2019-02-25 DIAGNOSIS — R5383 Other fatigue: Secondary | ICD-10-CM

## 2019-02-25 DIAGNOSIS — Z7984 Long term (current) use of oral hypoglycemic drugs: Secondary | ICD-10-CM | POA: Insufficient documentation

## 2019-02-25 DIAGNOSIS — Z5112 Encounter for antineoplastic immunotherapy: Secondary | ICD-10-CM

## 2019-02-25 DIAGNOSIS — Z170 Estrogen receptor positive status [ER+]: Secondary | ICD-10-CM | POA: Diagnosis not present

## 2019-02-25 DIAGNOSIS — Z9221 Personal history of antineoplastic chemotherapy: Secondary | ICD-10-CM | POA: Diagnosis not present

## 2019-02-25 DIAGNOSIS — K449 Diaphragmatic hernia without obstruction or gangrene: Secondary | ICD-10-CM | POA: Insufficient documentation

## 2019-02-25 DIAGNOSIS — C787 Secondary malignant neoplasm of liver and intrahepatic bile duct: Secondary | ICD-10-CM | POA: Diagnosis not present

## 2019-02-25 DIAGNOSIS — Z95828 Presence of other vascular implants and grafts: Secondary | ICD-10-CM

## 2019-02-25 DIAGNOSIS — Z7952 Long term (current) use of systemic steroids: Secondary | ICD-10-CM | POA: Diagnosis not present

## 2019-02-25 HISTORY — DX: Secondary malignant neoplasm of brain: C79.31

## 2019-02-25 LAB — CMP (CANCER CENTER ONLY)
ALT: 15 U/L (ref 0–44)
AST: 43 U/L — ABNORMAL HIGH (ref 15–41)
Albumin: 3.2 g/dL — ABNORMAL LOW (ref 3.5–5.0)
Alkaline Phosphatase: 85 U/L (ref 38–126)
Anion gap: 8 (ref 5–15)
BUN: 13 mg/dL (ref 8–23)
CO2: 25 mmol/L (ref 22–32)
Calcium: 8.5 mg/dL — ABNORMAL LOW (ref 8.9–10.3)
Chloride: 104 mmol/L (ref 98–111)
Creatinine: 0.74 mg/dL (ref 0.44–1.00)
GFR, Est AFR Am: >60 mL/min (ref >60)
GFR, Estimated: >60 mL/min (ref >60)
Glucose, Bld: 195 mg/dL — ABNORMAL HIGH (ref 70–99)
Potassium: 3.7 mmol/L (ref 3.5–5.1)
Sodium: 137 mmol/L (ref 135–145)
Total Bilirubin: 1 mg/dL (ref 0.3–1.2)
Total Protein: 6.7 g/dL (ref 6.5–8.1)

## 2019-02-25 LAB — CBC WITH DIFFERENTIAL (CANCER CENTER ONLY)
Abs Immature Granulocytes: 0.01 10*3/uL (ref 0.00–0.07)
Basophils Absolute: 0 10*3/uL (ref 0.0–0.1)
Basophils Relative: 1 %
Eosinophils Absolute: 0.1 10*3/uL (ref 0.0–0.5)
Eosinophils Relative: 2 %
HCT: 39.3 % (ref 36.0–46.0)
Hemoglobin: 13.1 g/dL (ref 12.0–15.0)
Immature Granulocytes: 0 %
Lymphocytes Relative: 20 %
Lymphs Abs: 0.9 10*3/uL (ref 0.7–4.0)
MCH: 30.8 pg (ref 26.0–34.0)
MCHC: 33.3 g/dL (ref 30.0–36.0)
MCV: 92.3 fL (ref 80.0–100.0)
Monocytes Absolute: 0.5 10*3/uL (ref 0.1–1.0)
Monocytes Relative: 11 %
Neutro Abs: 2.9 10*3/uL (ref 1.7–7.7)
Neutrophils Relative %: 66 %
Platelet Count: 116 10*3/uL — ABNORMAL LOW (ref 150–400)
RBC: 4.26 MIL/uL (ref 3.87–5.11)
RDW: 15.4 % (ref 11.5–15.5)
WBC Count: 4.4 10*3/uL (ref 4.0–10.5)
nRBC: 0 % (ref 0.0–0.2)

## 2019-02-25 LAB — TSH: TSH: 0.802 u[IU]/mL (ref 0.308–3.960)

## 2019-02-25 MED ORDER — DIPHENHYDRAMINE HCL 25 MG PO CAPS
50.0000 mg | ORAL_CAPSULE | Freq: Once | ORAL | Status: AC
  Administered 2019-02-25: 50 mg via ORAL

## 2019-02-25 MED ORDER — SODIUM CHLORIDE 0.9 % IV SOLN
3.0000 mg/kg | Freq: Once | INTRAVENOUS | Status: AC
  Administered 2019-02-25: 200 mg via INTRAVENOUS
  Filled 2019-02-25: qty 10

## 2019-02-25 MED ORDER — DIPHENHYDRAMINE HCL 25 MG PO CAPS
ORAL_CAPSULE | ORAL | Status: AC
  Filled 2019-02-25: qty 1

## 2019-02-25 MED ORDER — ACETAMINOPHEN 325 MG PO TABS
650.0000 mg | ORAL_TABLET | Freq: Once | ORAL | Status: AC
  Administered 2019-02-25: 650 mg via ORAL

## 2019-02-25 MED ORDER — SODIUM CHLORIDE 0.9 % IV SOLN
Freq: Once | INTRAVENOUS | Status: AC
  Administered 2019-02-25: 14:00:00 via INTRAVENOUS
  Filled 2019-02-25: qty 250

## 2019-02-25 MED ORDER — SODIUM CHLORIDE 0.9% FLUSH
10.0000 mL | INTRAVENOUS | Status: DC | PRN
  Administered 2019-02-25: 10 mL via INTRAVENOUS
  Filled 2019-02-25: qty 10

## 2019-02-25 MED ORDER — ACETAMINOPHEN 325 MG PO TABS
ORAL_TABLET | ORAL | Status: AC
  Filled 2019-02-25: qty 2

## 2019-02-25 MED ORDER — HEPARIN SOD (PORK) LOCK FLUSH 100 UNIT/ML IV SOLN
500.0000 [IU] | Freq: Once | INTRAVENOUS | Status: AC | PRN
  Administered 2019-02-25: 500 [IU]
  Filled 2019-02-25: qty 5

## 2019-02-25 MED ORDER — SODIUM CHLORIDE 0.9% FLUSH
10.0000 mL | INTRAVENOUS | Status: DC | PRN
  Administered 2019-02-25: 10 mL
  Filled 2019-02-25: qty 10

## 2019-02-25 NOTE — Progress Notes (Signed)
Radiation Oncology         [phone] ________________________________  Name: Monique Figueroa MRN: [id number]  Date: 02/25/2019  DOB: [birth year]  Follow up Visit Note  CC: Monique Gravel, MD  Monique Lose, MD  Diagnosis:   85 yo woman with a new 84mm brain metastasis at the left posterior frontal vertex from breast cancer     Interval Since Last Radiation:  9 months  05/11/2018 - 05/22/2018: The posterior fossa was treated to 30 Gy in 10 fractions  Narrative:  The patient returns today for routine follow-up.  She had a recent follow up MRI brain on 02/18/19 which showed  a new 47mm brain metastasis at the left posterior frontal vertex with favorable evolution of the previously treated cerebellar disease, with only mild smudgy enhancement remaining visible at the site of the largest treated lesion at the right posterior cerebellum. Other foci no longer show enhancement. She remains on targeted therapy with Kadcyla which was started on 05/29/2018 under the care and direction of Dr. Lindi Figueroa. She has completed 6 cycles and continues to tolerate this well.  She has a scheduled follow up with Dr. Lindi Figueroa later this morning to review her recent systemic imaging for disease restaging which shows overall disease stability with a slight decrease in size of a right hepatic lesion and no evidence of new, distant metastatic disease in the chest, abdomen or pelvis.              On review of systems, the patient states that she is doing very well overall.  She is accompanied by her son, Monique Figueroa, today who agrees that she has had continued improvement in her energy, coordination and gait since completion of her prior radiotherapy but had noticed some short term memory loss.  Today, she is without complaints.  She has residual blurry vision in the left eye, unchanged since the time of her diagnosis.  She specifically denies headaches, N/V, new changes in her visual or auditory acuity, dizziness, imbalance, tremor or  seizure activity. She has a new 3 month old great-grandson that she is enjoying seeing periodically although they live in [place], [place] and the COVID-19 has put a damper on their ability to travel for visits.  ALLERGIES:  is allergic to perjeta [pertuzumab] and sulfa antibiotics.  Meds: Current Outpatient Medications  Medication Sig Dispense Refill   acetaminophen (TYLENOL) 500 MG tablet Take 500 mg by mouth 2 (two) times daily as needed for moderate pain or headache.     benzocaine (ORAJEL) 10 % mucosal gel Use as directed 1 application in the mouth or throat as needed for mouth pain.     calcium carbonate (TUMS - DOSED IN MG ELEMENTAL CALCIUM) 500 MG chewable tablet Chew 1 tablet by mouth 2 (two) times daily as needed for indigestion or heartburn.      diphenhydrAMINE (BENADRYL) 25 MG tablet Take 25 mg by mouth daily as needed for allergies.     hydrochlorothiazide (HYDRODIURIL) 25 MG tablet Take 25 mg by mouth daily.     levothyroxine (SYNTHROID) 125 MCG tablet Take 1 tablet (125 mcg total) by mouth daily before breakfast. 30 tablet 0   Liniments (BLUE-EMU SUPER STRENGTH EX) Apply 1 application topically daily as needed (muscle pain).     metFORMIN (GLUCOPHAGE) 500 MG tablet Take 500 mg by mouth daily with breakfast.      omeprazole (PRILOSEC OTC) 20 MG tablet Take 20 mg by mouth daily as needed (acid reflux).  ondansetron (ZOFRAN-ODT) 8 MG disintegrating tablet TAKE 1 TABLET (8 MG TOTAL) BY MOUTH EVERY 8 (EIGHT) HOURS AS NEEDED FOR NAUSEA OR VOMITING. 20 tablet 1   oxyCODONE-acetaminophen (PERCOCET/ROXICET) 5-325 MG tablet Take 1 tablet by mouth daily as needed for severe pain.     Phenazopyridine HCl (AZO-STANDARD PO) Take 1 tablet by mouth 2 (two) times daily as needed (uti symptoms).     potassium chloride (MICRO-K) 10 MEQ CR capsule TAKE 1 CAPSULE (10 MEQ TOTAL) BY MOUTH 2 (TWO) TIMES DAILY. 60 capsule 0   prochlorperazine (COMPAZINE) 10 MG tablet Take 1 tablet (10 mg total)  by mouth every 6 (six) hours as needed for nausea or vomiting. 30 tablet 6   Propylene Glycol (SYSTANE COMPLETE) 0.6 % SOLN Place 1 drop into both eyes 2 (two) times daily.     sodium chloride (OCEAN) 0.65 % SOLN nasal spray Place 1 spray into both nostrils as needed for congestion.     No current facility-administered medications for this encounter.     Physical Findings:  vitals were not taken for this visit.   /10 In general this is a well appearing Caucasian female in no acute distress.  She's alert and oriented x4 and appropriate throughout the examination. Cardiopulmonary assessment is negative for acute distress and she exhibits normal effort.  EOMs are intact bilaterally and PERRLA.  Her speech is appropriate and well organized.    Lab Findings: Lab Results  Component Value Date   WBC 3.2 (L) 02/04/2019   HGB 12.4 02/04/2019   HCT 37.1 02/04/2019   MCV 91.2 02/04/2019   PLT 117 (L) 02/04/2019     Radiographic Findings: Ct Chest W Contrast  Result Date: 02/22/2019 CLINICAL DATA:  Metastatic breast cancer. EXAM: CT CHEST, ABDOMEN, AND PELVIS WITH CONTRAST TECHNIQUE: Multidetector CT imaging of the chest, abdomen and pelvis was performed following the standard protocol during bolus administration of intravenous contrast. CONTRAST:  141mL OMNIPAQUE IOHEXOL 300 MG/ML  SOLN COMPARISON:  09/30/2018. FINDINGS: CT CHEST FINDINGS Cardiovascular: Right IJ Port-A-Cath terminates at the SVC RA junction. Atherosclerotic calcification of the aorta and coronary arteries. Heart is mildly enlarged. No pericardial effusion. Mediastinum/Nodes: No pathologically enlarged mediastinal, hilar, axillary or internal mammary lymph nodes. Prepericardiac lymph node measures 6 mm, stable. Esophagus is grossly unremarkable. Lungs/Pleura: Septal thickening and peribronchovascular nodularity in the lateral segment right middle lobe, unchanged. Mild bronchiectasis and volume loss in the medial segment right middle  lobe, as before. Subpleural radiation scarring in the anterior left upper lobe. No pleural fluid. Airway is unremarkable. Musculoskeletal: Degenerative changes in the spine. No worrisome lytic or sclerotic lesions. CT ABDOMEN PELVIS FINDINGS Hepatobiliary: Liver margin is irregular. Heterogeneous and partially calcified mass in the dome of the right hepatic lobe measures 4.5 x 5.1 cm, slightly decreased from 5.1 x 5.4 cm. Additional scattered subcentimeter low-attenuation lesions are similar. Cholecystectomy. A stent is seen in the common bile duct. Pancreas: Negative. Spleen: Negative. Adrenals/Urinary Tract: Adrenal glands are unremarkable. Subcentimeter low-attenuation lesions in the right kidney are too small to characterize. Ureters are decompressed. Bladder is grossly unremarkable. Stomach/Bowel: Small hiatal hernia. Stomach is relatively decompressed. Common bile duct stent terminates in the duodenum. Small bowel and colon are otherwise unremarkable. Appendix is not readily visualized. Vascular/Lymphatic: Atherosclerotic calcification of the aorta without aneurysm. Mild vague haziness in the abdominal retroperitoneum, similar. No discrete adenopathy. Reproductive: Calcified lesions in the uterus are likely fibroids. No adnexal mass. Other: No free fluid.  Mesenteries and peritoneum are unremarkable. Musculoskeletal: Degenerative  changes in the spine. No worrisome lytic or sclerotic lesions. Mild grade 1 anterolisthesis of L5 on S1. IMPRESSION: 1. Slight interval decrease in size of presumably treated metastasis in the right hepatic lobe. Additional hypodense lesions in the liver are grossly stable. 2. Marginal irregularity of the liver can be seen with treatment related hepatic fibrosis or cirrhosis. 3. No additional evidence of distant metastatic disease. 4. Aortic atherosclerosis (ICD10-170.0). Coronary artery calcification. Electronically Signed   By: Lorin Picket M.D.   On: 02/22/2019 12:04   Mr  Jeri Cos LP Contrast  Result Date: 02/19/2019 CLINICAL DATA:  Follow-up treated brain metastases. EXAM: MRI HEAD WITHOUT AND WITH CONTRAST TECHNIQUE: Multiplanar, multiecho pulse sequences of the brain and surrounding structures were obtained without and with intravenous contrast. CONTRAST:  6 cc Gadavist COMPARISON:  08/28/2018.  05/07/2018. FINDINGS: BRAIN New Lesions: 4 mm metastasis at the left posterior frontal vertex axial image 140. Larger lesions: None. Stable or Smaller lesions: Multiple treated lesions in the cerebellum continue to improve by imaging. There is now only minimal smudgy enhancement at the site of the previously seen largest lesion in the right posterior cerebellum. Other treated foci in no longer show contrast enhancement. Other Brain findings: Chronic small-vessel ischemic changes of the hemispheric white matter as seen previously. Old right thalamic small vessel infarction. No hydrocephalus or extra-axial collection. Vascular: Major vessels at the base of the brain show flow. Skull and upper cervical spine: Negative Sinuses/Orbits: Clear/normal Other: None IMPRESSION: 1. New 4 mm metastasis at the left posterior frontal vertex axial image 140. 2. Favorable evolution of the treated cerebellar disease, with only mild smudgy enhancement remaining visible at the site of the previously seen largest lesion at the right posterior cerebellum. Other foci no longer show enhancement. 3. Chronic small-vessel ischemic changes the brain elsewhere. Electronically Signed   By: Nelson Chimes M.D.   On: 02/19/2019 10:18   Ct Abdomen Pelvis W Contrast  Result Date: 02/22/2019 CLINICAL DATA:  Metastatic breast cancer. EXAM: CT CHEST, ABDOMEN, AND PELVIS WITH CONTRAST TECHNIQUE: Multidetector CT imaging of the chest, abdomen and pelvis was performed following the standard protocol during bolus administration of intravenous contrast. CONTRAST:  118mL OMNIPAQUE IOHEXOL 300 MG/ML  SOLN COMPARISON:  09/30/2018.  FINDINGS: CT CHEST FINDINGS Cardiovascular: Right IJ Port-A-Cath terminates at the SVC RA junction. Atherosclerotic calcification of the aorta and coronary arteries. Heart is mildly enlarged. No pericardial effusion. Mediastinum/Nodes: No pathologically enlarged mediastinal, hilar, axillary or internal mammary lymph nodes. Prepericardiac lymph node measures 6 mm, stable. Esophagus is grossly unremarkable. Lungs/Pleura: Septal thickening and peribronchovascular nodularity in the lateral segment right middle lobe, unchanged. Mild bronchiectasis and volume loss in the medial segment right middle lobe, as before. Subpleural radiation scarring in the anterior left upper lobe. No pleural fluid. Airway is unremarkable. Musculoskeletal: Degenerative changes in the spine. No worrisome lytic or sclerotic lesions. CT ABDOMEN PELVIS FINDINGS Hepatobiliary: Liver margin is irregular. Heterogeneous and partially calcified mass in the dome of the right hepatic lobe measures 4.5 x 5.1 cm, slightly decreased from 5.1 x 5.4 cm. Additional scattered subcentimeter low-attenuation lesions are similar. Cholecystectomy. A stent is seen in the common bile duct. Pancreas: Negative. Spleen: Negative. Adrenals/Urinary Tract: Adrenal glands are unremarkable. Subcentimeter low-attenuation lesions in the right kidney are too small to characterize. Ureters are decompressed. Bladder is grossly unremarkable. Stomach/Bowel: Small hiatal hernia. Stomach is relatively decompressed. Common bile duct stent terminates in the duodenum. Small bowel and colon are otherwise unremarkable. Appendix is not readily visualized.  Vascular/Lymphatic: Atherosclerotic calcification of the aorta without aneurysm. Mild vague haziness in the abdominal retroperitoneum, similar. No discrete adenopathy. Reproductive: Calcified lesions in the uterus are likely fibroids. No adnexal mass. Other: No free fluid.  Mesenteries and peritoneum are unremarkable. Musculoskeletal:  Degenerative changes in the spine. No worrisome lytic or sclerotic lesions. Mild grade 1 anterolisthesis of L5 on S1. IMPRESSION: 1. Slight interval decrease in size of presumably treated metastasis in the right hepatic lobe. Additional hypodense lesions in the liver are grossly stable. 2. Marginal irregularity of the liver can be seen with treatment related hepatic fibrosis or cirrhosis. 3. No additional evidence of distant metastatic disease. 4. Aortic atherosclerosis (ICD10-170.0). Coronary artery calcification. Electronically Signed   By: Lorin Picket M.D.   On: 02/22/2019 12:04    Impression/Plan: 73. 85 yo female with a new 43mm brain metastasis at the left posterior frontal vertex from breast cancer.    Today, I talked to the patient and her son, Monique Figueroa,  about the findings and workup thus far. We reviewed her recent MRI brain from 02/18/19 which showed  a new 56mm brain metastasis at the left posterior frontal vertex with favorable evolution of the previously treated cerebellar disease, with only mild smudgy enhancement remaining visible at the site of the largest treated lesion at the right posterior cerebellum. Other treated foci no longer show enhancement.  We discussed the natural history of metastatic carcinoma and general treatment, highlighting the role of radiotherapy in the management. We discussed the available radiation techniques, and focused on the details of logistics and delivery. The recommendation is to proceed with a single fraction SRS treatmentt to the new 4 mm lesion in the left frontal lobe. We reviewed the anticipated acute and late sequelae associated with radiation in this setting. The patient was encouraged to ask questions that were answered to her satisfaction.  She has previously tolerated brain radiation very well.    At the conclusion of our conversation, she elects to proceed with single fraction SRS treatment as recommended above. She appears to have a good understanding  of her disease and our treatment recommendations and is comfortable and in agreement with the stated plan.  She has freely signed written consent to proceed today in the office and a copy of this document has been placed in her chart as well a a copy provided to her. She will have CT SIM/treatment planning following our visit today in anticipation of her treatment at 3:15pm on Tuesday 03/02/19.  Her treatment will be coordinated with Dr. Sherwood Gambler, neurosurgery, who is planning to meet with her later today during her infusion in the cancer center. She will also continue in routine follow-up under the care and direction of Dr. Lindi Figueroa- meeting with him later this morning to review results of her recent systemic imaging.   She knows to call us at anytime with any concerns or questions regarding her treatment.    Nicholos Johns, PA-C

## 2019-02-25 NOTE — Progress Notes (Signed)
Has armband been applied?  Yes.    Does patient have an allergy to IV contrast dye?: No.   Has patient ever received premedication for IV contrast dye?: No.   Does patient take metformin?: Yes.    If patient does take metformin when was the last dose: 02/24/2019 AM  Date of lab work: 02/04/2019 BUN: 11 CR: 0.67  IV site: subclavian right, condition no redness and patent  Has IV site been added to flowsheet?  Yes.

## 2019-02-25 NOTE — Progress Notes (Signed)
  Radiation Oncology         (336) [phone] ________________________________  Name: Monique Figueroa MRN: [id number]  Date: 02/25/2019  DOB: 1933/10/23  SIMULATION AND TREATMENT PLANNING NOTE    ICD-10-CM   1. Brain metastases ([place])  C79.31     DIAGNOSIS:  85 yo woman with a 4 mm left frontal solitary brain metastasis from breast cancer  NARRATIVE:  The patient was brought to the [facility].  Identity was confirmed.  All relevant records and images related to the planned course of therapy were reviewed.  The patient freely provided informed written consent to proceed with treatment after reviewing the details related to the planned course of therapy. The consent form was witnessed and verified by the simulation staff. Intravenous access was established for contrast administration. Then, the patient was set-up in a stable reproducible supine position for radiation therapy.  A relocatable thermoplastic stereotactic head frame was fabricated for precise immobilization.  CT images were obtained.  Surface markings were placed.  The CT images were loaded into the planning software and fused with the patient's targeting MRI scan.  Then the target and avoidance structures were contoured.  Treatment planning then occurred.  The radiation prescription was entered and confirmed.  I have requested 3D planning  I have requested a DVH of the following structures: Brain stem, brain, left eye, right eye, lenses, optic chiasm, target volumes, uninvolved brain, and normal tissue.    SPECIAL TREATMENT PROCEDURE:  The planned course of therapy using radiation constitutes a special treatment procedure. Special care is required in the management of this patient for the following reasons. This treatment constitutes a Special Treatment Procedure for the following reason: High dose per fraction requiring special monitoring for increased toxicities of treatment including daily imaging.  The special nature of  the planned course of radiotherapy will require increased physician supervision and oversight to ensure patient's safety with optimal treatment outcomes.  PLAN:  The patient will receive 20 Gy in 1 fraction.  ________________________________  Sheral Apley Tammi Klippel, M.D.

## 2019-02-25 NOTE — Patient Instructions (Signed)
[facility] Discharge Instructions for Patients Receiving Chemotherapy  Today you received the following chemotherapy agents Kadcyla  To help prevent nausea and vomiting after your treatment, we encourage you to take your nausea medication as directed   If you develop nausea and vomiting that is not controlled by your nausea medication, call the clinic.   BELOW ARE SYMPTOMS THAT SHOULD BE REPORTED IMMEDIATELY:  *FEVER GREATER THAN 100.5 F  *CHILLS WITH OR WITHOUT FEVER  NAUSEA AND VOMITING THAT IS NOT CONTROLLED WITH YOUR NAUSEA MEDICATION  *UNUSUAL SHORTNESS OF BREATH  *UNUSUAL BRUISING OR BLEEDING  TENDERNESS IN MOUTH AND THROAT WITH OR WITHOUT PRESENCE OF ULCERS  *URINARY PROBLEMS  *BOWEL PROBLEMS  UNUSUAL RASH Items with * indicate a potential emergency and should be followed up as soon as possible.  Feel free to call the clinic should you have any questions or concerns. The clinic phone number is [phone].  Please show the CHEMO ALERT CARD at check-in to the Emergency Department and triage nurse.   

## 2019-02-25 NOTE — Progress Notes (Addendum)
Error

## 2019-02-25 NOTE — Progress Notes (Signed)
Accessed right subclavian power port per protocol. Patient tolerated well. Site provided excellent blood return and flushed without difficulty. Patient and son, [name], understand a nurse in infusion will remove this access needle once her infusion is done today.

## 2019-02-26 DIAGNOSIS — C7931 Secondary malignant neoplasm of brain: Secondary | ICD-10-CM | POA: Diagnosis not present

## 2019-02-26 DIAGNOSIS — Z79899 Other long term (current) drug therapy: Secondary | ICD-10-CM | POA: Diagnosis not present

## 2019-02-26 DIAGNOSIS — I700 Atherosclerosis of aorta: Secondary | ICD-10-CM | POA: Diagnosis not present

## 2019-02-26 DIAGNOSIS — K449 Diaphragmatic hernia without obstruction or gangrene: Secondary | ICD-10-CM | POA: Diagnosis not present

## 2019-02-26 DIAGNOSIS — Z170 Estrogen receptor positive status [ER+]: Secondary | ICD-10-CM | POA: Diagnosis not present

## 2019-02-26 DIAGNOSIS — Z7984 Long term (current) use of oral hypoglycemic drugs: Secondary | ICD-10-CM | POA: Diagnosis not present

## 2019-02-26 DIAGNOSIS — Z923 Personal history of irradiation: Secondary | ICD-10-CM | POA: Diagnosis not present

## 2019-02-26 DIAGNOSIS — C50912 Malignant neoplasm of unspecified site of left female breast: Secondary | ICD-10-CM | POA: Diagnosis not present

## 2019-03-01 ENCOUNTER — Telehealth: Payer: Self-pay | Admitting: Hematology and Oncology

## 2019-03-01 ENCOUNTER — Ambulatory Visit
Admission: RE | Admit: 2019-03-01 | Discharge: 2019-03-01 | Disposition: A | Discharge status: Home / Self Care | Payer: Medicare Other | Source: Ambulatory Visit | Attending: Radiation Oncology | Admitting: Radiation Oncology

## 2019-03-01 ENCOUNTER — Telehealth: Payer: Self-pay | Admitting: Radiation Oncology

## 2019-03-01 ENCOUNTER — Other Ambulatory Visit (HOSPITAL_COMMUNITY): Payer: Medicare Other

## 2019-03-01 ENCOUNTER — Other Ambulatory Visit: Payer: Self-pay

## 2019-03-01 DIAGNOSIS — I700 Atherosclerosis of aorta: Secondary | ICD-10-CM | POA: Diagnosis not present

## 2019-03-01 DIAGNOSIS — Z7984 Long term (current) use of oral hypoglycemic drugs: Secondary | ICD-10-CM | POA: Diagnosis not present

## 2019-03-01 DIAGNOSIS — C7931 Secondary malignant neoplasm of brain: Secondary | ICD-10-CM

## 2019-03-01 DIAGNOSIS — Z79899 Other long term (current) drug therapy: Secondary | ICD-10-CM | POA: Diagnosis not present

## 2019-03-01 DIAGNOSIS — K449 Diaphragmatic hernia without obstruction or gangrene: Secondary | ICD-10-CM | POA: Diagnosis not present

## 2019-03-01 DIAGNOSIS — C50912 Malignant neoplasm of unspecified site of left female breast: Secondary | ICD-10-CM | POA: Diagnosis not present

## 2019-03-01 DIAGNOSIS — Z923 Personal history of irradiation: Secondary | ICD-10-CM | POA: Diagnosis not present

## 2019-03-01 DIAGNOSIS — Z170 Estrogen receptor positive status [ER+]: Secondary | ICD-10-CM | POA: Diagnosis not present

## 2019-03-01 LAB — BUN & CREATININE (CHCC)
BUN: 11 mg/dL (ref 8–23)
Creatinine: 0.79 mg/dL (ref 0.44–1.00)
GFR, Est AFR Am: >60 mL/min (ref >60)
GFR, Estimated: >60 mL/min (ref >60)

## 2019-03-01 NOTE — Telephone Encounter (Signed)
Phoned patient. Son, Elta Guadeloupe, answered phone. Explained his mother's labs came back normal and that she should resume her Metformin today. Elta Guadeloupe verbalized understanding and expressed intent to tell his mother.

## 2019-03-01 NOTE — Telephone Encounter (Signed)
I talk with patient regarding schedule  

## 2019-03-02 ENCOUNTER — Ambulatory Visit
Admission: RE | Admit: 2019-03-02 | Discharge: 2019-03-02 | Disposition: A | Discharge status: Home / Self Care | Payer: Medicare Other | Source: Ambulatory Visit | Attending: Radiation Oncology | Admitting: Radiation Oncology

## 2019-03-02 ENCOUNTER — Other Ambulatory Visit: Payer: Self-pay

## 2019-03-02 DIAGNOSIS — Z7984 Long term (current) use of oral hypoglycemic drugs: Secondary | ICD-10-CM | POA: Diagnosis not present

## 2019-03-02 DIAGNOSIS — C7931 Secondary malignant neoplasm of brain: Secondary | ICD-10-CM | POA: Diagnosis not present

## 2019-03-02 DIAGNOSIS — I700 Atherosclerosis of aorta: Secondary | ICD-10-CM | POA: Diagnosis not present

## 2019-03-02 DIAGNOSIS — Z170 Estrogen receptor positive status [ER+]: Secondary | ICD-10-CM | POA: Diagnosis not present

## 2019-03-02 DIAGNOSIS — Z79899 Other long term (current) drug therapy: Secondary | ICD-10-CM | POA: Diagnosis not present

## 2019-03-02 DIAGNOSIS — Z923 Personal history of irradiation: Secondary | ICD-10-CM | POA: Diagnosis not present

## 2019-03-02 DIAGNOSIS — C50912 Malignant neoplasm of unspecified site of left female breast: Secondary | ICD-10-CM | POA: Diagnosis not present

## 2019-03-02 DIAGNOSIS — K449 Diaphragmatic hernia without obstruction or gangrene: Secondary | ICD-10-CM | POA: Diagnosis not present

## 2019-03-02 NOTE — Progress Notes (Signed)
Nurse monitoring complete following srs treatment. VS stable. Patient alert and oriented x 3. No distress noted. Denies new onset of neurologic symptoms. Patient is not taking decadron at this time. Instructed patient and son that she should avoid strenuous activity for the next 24 hours and call [phone] with needs. Patient exited clinic walking arm and arm with son. No distress noted. Steady gait noted. Both had verbalized understanding of all reviewed.

## 2019-03-02 NOTE — Op Note (Signed)
Stereotactic Radiosurgery Operative Note  Name: CHRISTASIA ANGELETTI MRN: [id number]  Date: 03/02/2019  DOB: 1933-12-23  Op Note  Pre Operative Diagnosis:  Brain metastasis from metastatic breast cancer  Post Operative Diagnois:  Brain metastasis from metastatic breast cancer  3D TREATMENT PLANNING AND DOSIMETRY:  The patient's radiation plan was reviewed and approved by myself (neurosurgery) and Dr. Ledon Snare (radiation oncology) prior to treatment.  It showed 3-dimensional radiation distributions overlaid onto the planning CT/MRI image set.  The [facility] for the target structures as well as the organs at risk were reviewed. The documentation of the 3D plan and dosimetry are filed in the radiation oncology EMR.  NARRATIVE:  SAMHITHA ROSEN was brought to the TrueBeam stereotactic radiation treatment machine and placed supine on the CT couch. The head frame was applied, and the patient was set up for stereotactic radiosurgery.  I was present virtually by [employer] for the set-up and delivery.  SIMULATION VERIFICATION:  In the couch zero-angle position, the patient underwent Exactrac imaging using the Brainlab system with orthogonal KV images.  These were carefully aligned and repeated to confirm treatment position for each of the isocenters.  The Exactrac snap film verification was repeated at each couch angle.  SPECIAL TREATMENT PROCEDURE: Raoul Pitch received stereotactic radiosurgery to the following target: Left frontal target was treated using 3 Dynamic Conformal Arcs to a prescription dose of 20 Gy.  ExacTrac registration was performed for each couch angle.  The 100% isodose line was prescribed.  STEREOTACTIC TREATMENT MANAGEMENT:  Following delivery, the patient was transported to nursing in stable condition and monitored for possible acute effects.  Vital signs were recorded. The patient tolerated treatment without significant acute effects, and was discharged to home in stable condition.     PLAN: Follow-up in one month.

## 2019-03-04 NOTE — Progress Notes (Signed)
  Radiation Oncology         [phone] ________________________________  Stereotactic Treatment Procedure Note  Name: AMRAN MALTER MRN: [id number]  Date: 03/02/2019  DOB: 1933-10-17  SPECIAL TREATMENT PROCEDURE    ICD-10-CM   1. Brain metastases ([place])  C79.31     3D TREATMENT PLANNING AND DOSIMETRY:  The patient's radiation plan was reviewed and approved by neurosurgery and radiation oncology prior to treatment.  It showed 3-dimensional radiation distributions overlaid onto the planning CT/MRI image set.  The [facility] for the target structures as well as the organs at risk were reviewed. The documentation of the 3D plan and dosimetry are filed in the radiation oncology EMR.  NARRATIVE:  NYKOLE MATOS was brought to the TrueBeam stereotactic radiation treatment machine and placed supine on the CT couch. The head frame was applied, and the patient was set up for stereotactic radiosurgery.  Neurosurgery was present for the set-up and delivery  SIMULATION VERIFICATION:  In the couch zero-angle position, the patient underwent Exactrac imaging using the Brainlab system with orthogonal KV images.  These were carefully aligned and repeated to confirm treatment position for each of the isocenters.  The Exactrac snap film verification was repeated at each couch angle.  PROCEDURE: Raoul Pitch received stereotactic radiosurgery to the following targets:  Left frontal target was treated using 3 Dynamic Conformal Arcs to a prescription dose of 20 Gy.  ExacTrac registration was performed for each couch angle.  The 100% isodose line was prescribed.  6 MV X-rays were delivered in the flattening filter free beam mode.  STEREOTACTIC TREATMENT MANAGEMENT:  Following delivery, the patient was transported to nursing in stable condition and monitored for possible acute effects.  Vital signs were recorded BP (P) 126/75   Pulse (P) 98   Temp (P) 99.1 F (37.3 C)   Resp (P) 18   SpO2 (P) 98% . The  patient tolerated treatment without significant acute effects, and was discharged to home in stable condition.    PLAN: Follow-up in one month.  ________________________________  Sheral Apley. Tammi Klippel, M.D.

## 2019-03-09 ENCOUNTER — Other Ambulatory Visit: Payer: Self-pay

## 2019-03-09 ENCOUNTER — Ambulatory Visit (HOSPITAL_COMMUNITY)
Admission: RE | Admit: 2019-03-09 | Discharge: 2019-03-09 | Disposition: A | Discharge status: Home / Self Care | Payer: Medicare Other | Source: Ambulatory Visit | Attending: Adult Health | Admitting: Adult Health

## 2019-03-09 DIAGNOSIS — I083 Combined rheumatic disorders of mitral, aortic and tricuspid valves: Secondary | ICD-10-CM | POA: Diagnosis not present

## 2019-03-09 DIAGNOSIS — E119 Type 2 diabetes mellitus without complications: Secondary | ICD-10-CM | POA: Insufficient documentation

## 2019-03-09 DIAGNOSIS — C50919 Malignant neoplasm of unspecified site of unspecified female breast: Secondary | ICD-10-CM | POA: Diagnosis not present

## 2019-03-09 DIAGNOSIS — I10 Essential (primary) hypertension: Secondary | ICD-10-CM | POA: Diagnosis not present

## 2019-03-09 NOTE — Progress Notes (Signed)
Echocardiogram 2D Echocardiogram has been performed.  Oneal Deputy Kaelem Brach 03/09/2019, 1:45 PM

## 2019-03-18 ENCOUNTER — Inpatient Hospital Stay: Payer: Medicare Other

## 2019-03-18 ENCOUNTER — Other Ambulatory Visit: Payer: Self-pay

## 2019-03-18 ENCOUNTER — Inpatient Hospital Stay: Payer: Medicare Other | Attending: Hematology and Oncology

## 2019-03-18 VITALS — BP 141/66 | HR 78 | Temp 98.4°F | Resp 18

## 2019-03-18 DIAGNOSIS — C50919 Malignant neoplasm of unspecified site of unspecified female breast: Secondary | ICD-10-CM

## 2019-03-18 DIAGNOSIS — Z5112 Encounter for antineoplastic immunotherapy: Secondary | ICD-10-CM | POA: Insufficient documentation

## 2019-03-18 DIAGNOSIS — C7931 Secondary malignant neoplasm of brain: Secondary | ICD-10-CM | POA: Diagnosis not present

## 2019-03-18 DIAGNOSIS — Z95828 Presence of other vascular implants and grafts: Secondary | ICD-10-CM

## 2019-03-18 LAB — CBC WITH DIFFERENTIAL (CANCER CENTER ONLY)
Abs Immature Granulocytes: 0.01 10*3/uL (ref 0.00–0.07)
Basophils Absolute: 0 10*3/uL (ref 0.0–0.1)
Basophils Relative: 1 %
Eosinophils Absolute: 0.1 10*3/uL (ref 0.0–0.5)
Eosinophils Relative: 2 %
HCT: 36.4 % (ref 36.0–46.0)
Hemoglobin: 12.1 g/dL (ref 12.0–15.0)
Immature Granulocytes: 0 %
Lymphocytes Relative: 19 %
Lymphs Abs: 0.7 10*3/uL (ref 0.7–4.0)
MCH: 30.3 pg (ref 26.0–34.0)
MCHC: 33.2 g/dL (ref 30.0–36.0)
MCV: 91 fL (ref 80.0–100.0)
Monocytes Absolute: 0.5 10*3/uL (ref 0.1–1.0)
Monocytes Relative: 13 %
Neutro Abs: 2.5 10*3/uL (ref 1.7–7.7)
Neutrophils Relative %: 65 %
Platelet Count: 108 10*3/uL — ABNORMAL LOW (ref 150–400)
RBC: 4 MIL/uL (ref 3.87–5.11)
RDW: 16.4 % — ABNORMAL HIGH (ref 11.5–15.5)
WBC Count: 3.8 10*3/uL — ABNORMAL LOW (ref 4.0–10.5)
nRBC: 0 % (ref 0.0–0.2)

## 2019-03-18 LAB — CMP (CANCER CENTER ONLY)
ALT: 13 U/L (ref 0–44)
AST: 36 U/L (ref 15–41)
Albumin: 2.9 g/dL — ABNORMAL LOW (ref 3.5–5.0)
Alkaline Phosphatase: 74 U/L (ref 38–126)
Anion gap: 9 (ref 5–15)
BUN: 15 mg/dL (ref 8–23)
CO2: 24 mmol/L (ref 22–32)
Calcium: 8.3 mg/dL — ABNORMAL LOW (ref 8.9–10.3)
Chloride: 107 mmol/L (ref 98–111)
Creatinine: 0.74 mg/dL (ref 0.44–1.00)
GFR, Est AFR Am: >60 mL/min (ref >60)
GFR, Estimated: >60 mL/min (ref >60)
Glucose, Bld: 118 mg/dL — ABNORMAL HIGH (ref 70–99)
Potassium: 3.4 mmol/L — ABNORMAL LOW (ref 3.5–5.1)
Sodium: 140 mmol/L (ref 135–145)
Total Bilirubin: 1.1 mg/dL (ref 0.3–1.2)
Total Protein: 6.2 g/dL — ABNORMAL LOW (ref 6.5–8.1)

## 2019-03-18 MED ORDER — DIPHENHYDRAMINE HCL 25 MG PO CAPS
ORAL_CAPSULE | ORAL | Status: AC
  Filled 2019-03-18: qty 2

## 2019-03-18 MED ORDER — SODIUM CHLORIDE 0.9 % IV SOLN
Freq: Once | INTRAVENOUS | Status: AC
  Administered 2019-03-18: 15:00:00 via INTRAVENOUS
  Filled 2019-03-18: qty 250

## 2019-03-18 MED ORDER — SODIUM CHLORIDE 0.9% FLUSH
10.0000 mL | INTRAVENOUS | Status: DC | PRN
  Administered 2019-03-18: 10 mL via INTRAVENOUS
  Filled 2019-03-18: qty 10

## 2019-03-18 MED ORDER — HEPARIN SOD (PORK) LOCK FLUSH 100 UNIT/ML IV SOLN
500.0000 [IU] | Freq: Once | INTRAVENOUS | Status: AC | PRN
  Administered 2019-03-18: 500 [IU]
  Filled 2019-03-18: qty 5

## 2019-03-18 MED ORDER — ACETAMINOPHEN 325 MG PO TABS
ORAL_TABLET | ORAL | Status: AC
  Filled 2019-03-18: qty 2

## 2019-03-18 MED ORDER — ACETAMINOPHEN 325 MG PO TABS
650.0000 mg | ORAL_TABLET | Freq: Once | ORAL | Status: AC
  Administered 2019-03-18: 650 mg via ORAL

## 2019-03-18 MED ORDER — DIPHENHYDRAMINE HCL 25 MG PO CAPS
50.0000 mg | ORAL_CAPSULE | Freq: Once | ORAL | Status: AC
  Administered 2019-03-18: 50 mg via ORAL

## 2019-03-18 MED ORDER — SODIUM CHLORIDE 0.9 % IV SOLN
3.0000 mg/kg | Freq: Once | INTRAVENOUS | Status: AC
  Administered 2019-03-18: 200 mg via INTRAVENOUS
  Filled 2019-03-18: qty 10

## 2019-03-18 MED ORDER — SODIUM CHLORIDE 0.9% FLUSH
10.0000 mL | INTRAVENOUS | Status: DC | PRN
  Administered 2019-03-18: 10 mL
  Filled 2019-03-18: qty 10

## 2019-03-18 NOTE — Patient Instructions (Signed)
[facility] Discharge Instructions for Patients Receiving Chemotherapy  Today you received the following chemotherapy agents Ado-trastuzumab (KADCYLA).  To help prevent nausea and vomiting after your treatment, we encourage you to take your nausea medication as prescribed.   If you develop nausea and vomiting that is not controlled by your nausea medication, call the clinic.   BELOW ARE SYMPTOMS THAT SHOULD BE REPORTED IMMEDIATELY:  *FEVER GREATER THAN 100.5 F  *CHILLS WITH OR WITHOUT FEVER  NAUSEA AND VOMITING THAT IS NOT CONTROLLED WITH YOUR NAUSEA MEDICATION  *UNUSUAL SHORTNESS OF BREATH  *UNUSUAL BRUISING OR BLEEDING  TENDERNESS IN MOUTH AND THROAT WITH OR WITHOUT PRESENCE OF ULCERS  *URINARY PROBLEMS  *BOWEL PROBLEMS  UNUSUAL RASH Items with * indicate a potential emergency and should be followed up as soon as possible.  Feel free to call the clinic should you have any questions or concerns. The clinic phone number is [phone].  Please show the CHEMO ALERT CARD at check-in to the Emergency Department and triage nurse.  Coronavirus (COVID-19) Are you at risk?  Are you at risk for the Coronavirus (COVID-19)?  To be considered HIGH RISK for Coronavirus (COVID-19), you have to meet the following criteria:  . Traveled to [place], [place], [place], [place] or [place]; or in the [place] to [place], [place], [place], or [place]; and have fever, cough, and shortness of breath within the last 2 weeks of travel OR . Been in close contact with a person diagnosed with COVID-19 within the last 2 weeks and have fever, cough, and shortness of breath . IF YOU DO NOT MEET THESE CRITERIA, YOU ARE CONSIDERED LOW RISK FOR COVID-19.  What to do if you are HIGH RISK for COVID-19?  . If you are having a medical emergency, call 911. . Seek medical care right away. Before you go to a doctor's office, urgent care or emergency department, call ahead and tell  them about your recent travel, contact with someone diagnosed with COVID-19, and your symptoms. You should receive instructions from your physician's office regarding next steps of care.  . When you arrive at healthcare provider, tell the healthcare staff immediately you have returned from visiting [place], [place], [place], [place] or [place]; or traveled in the [place] to [place], [place], [place], or [place]; in the last two weeks or you have been in close contact with a person diagnosed with COVID-19 in the last 2 weeks.   . Tell the health care staff about your symptoms: fever, cough and shortness of breath. . After you have been seen by a medical provider, you will be either: o Tested for (COVID-19) and discharged home on quarantine except to seek medical care if symptoms worsen, and asked to  - Stay home and avoid contact with others until you get your results (4-5 days)  - Avoid travel on public transportation if possible (such as bus, train, or airplane) or o Sent to the Emergency Department by EMS for evaluation, COVID-19 testing, and possible admission depending on your condition and test results.  What to do if you are LOW RISK for COVID-19?  Reduce your risk of any infection by using the same precautions used for avoiding the common cold or flu:  . Wash your hands often with soap and warm water for at least 20 seconds.  If soap and water are not readily available, use an alcohol-based hand sanitizer with at least 60% alcohol.  . If coughing or   sneezing, cover your mouth and nose by coughing or sneezing into the elbow areas of your shirt or coat, into a tissue or into your sleeve (not your hands). . Avoid shaking hands with others and consider head nods or verbal greetings only. . Avoid touching your eyes, nose, or mouth with unwashed hands.  . Avoid close contact with people who are sick. . Avoid places or events with large numbers of people in one location, like concerts or  sporting events. . Carefully consider travel plans you have or are making. . If you are planning any travel outside or inside the [place], visit the CDC's Travelers' Health webpage for the latest health notices. . If you have some symptoms but not all symptoms, continue to monitor at home and seek medical attention if your symptoms worsen. . If you are having a medical emergency, call 911.   [place] / e-Visit: [email or web address]         [facility]: [facility]: [phone]                   [facility]: [phone]

## 2019-04-01 NOTE — Assessment & Plan Note (Signed)
Metastatic breast cancer with innumerable liver metastases detected on ultrasound and recent MRI of the liver on 04/26/2016 (Prior history of left breast IDC T1 1 N0 stage IA 0.3 cm grade 1 tumor that was ER 38%, PR 93%, Ki-67 10%, HER-2 2+, no tissue for FISH testing, 0/1 lymph node negative, status post lumpectomy radiation and 5 years of Arimidex completed in February 2013)  PET/CT scan: 05/07/2016: Extensive metastatic involvement of both lobes of the liver, primary could be liver, phalangeal or metastatic disease, hypermetabolic thoracic lymph nodes left axillary, left supraclavicular and right CP angle lymph nodes, hypermetabolic upper abdominal lymph nodes  Liver biopsy10/07/2016: Metastatic carcinoma breast primary strong positivity for CK 7 weak focal positivity for ER, GCDFP, negative for PR, CDX 2, TTF-1, WT 1; HER-2 positive  Treatment plan: 1. Taxotere Herceptin and Perjeta every 3 weeks palliative chemotherapy started 06/13/16 (Perjeta discontinued after 5 cycles due to allergy) 2. followed by Herceptin maintenance stopped 05/08/2018 due to progression, Kadcyla started --------------------------------------------------------------------------------------------------------------------------------------- 10/14/2018: ERCP with stent placement  Current treatment: Kadcyla starting 10/25/2019todayiscycle16 Patient completed palliative radiation therapy to the brain Echo4/30/2020 shows EF of 60-65%  No clinical signs of metastatic breast cancer progression. Kadcyla toxicities: No side effects to Kadcyla.Will continue.  CT CAP: 02/22/2019: Slight interval decrease in the size of the liver metastases 5.4 cm to 5.1 cm, additional nodules stable Brain MRI 02/18/2019: New 4 mm metastases left posterior frontal vertex, favorable evaluation of treated cerebellar disease Patient is going to get stereotactic radiosurgery for the new lesion  Since she is responding very well up with  systemic therapy, will continue Kadcyla every 3 weeks. I will see her every 6 weeks for follow-ups.

## 2019-04-07 NOTE — Progress Notes (Signed)
Patient Care Team: [name], MD as PCP - General (Internal Medicine)  DIAGNOSIS:    ICD-10-CM   1. Metastatic breast cancer ([place])  C50.919     SUMMARY OF ONCOLOGIC HISTORY: Oncology History  Metastatic breast cancer ([place])  06/05/2006 Initial Diagnosis   Left breast biopsy: DCIS with apocrine features ER 30%, PR 13%   06/19/2006 Surgery   Left lumpectomy: stage I, pT1a, pN0(i)(sn), pMX, 0.3 cm IDC, grade 1, with extensive high-grade DCIS , margins negative ER 38 %, PR 93 %, Ki-67 10%, HER-2/neu 2+, no additional tissue available for FISH testing, with 0/1 left axillary lymph nodes.   07/30/2006 - 09/17/2006 Radiation Therapy   Adjuvant radiation therapy   09/24/2006 - 09/25/2011 Anti-estrogen oral therapy   Antiestrogen therapy with Arimidex 5 years   04/26/2016 Relapse/Recurrence   Innumerable hepatic lesions seen in both lobes most are 1-2 cm size somewhat confluent largest in the posterior right hepatic dome 3 x 4.6 cm   05/07/2016 PET scan   PET/CT scan: Extensive metastatic involvement of both lobes of the liver, primary could be liver, phalangeal or metastatic disease, hypermetabolic thoracic LN left axillary, left supraclavicular and right CP angle LN, hypermetabolic upper abdominal LN   05/16/2016 Initial Biopsy   Liver biopsy: Metastatic carcinoma breast primary strong positivity for CK 7 weak focal positivity for ER, GCDFP, negative for PR, CDX 2, TTF-1, WT 1; HER-2 positive   06/13/2016 - 11/07/2016 Chemotherapy   Taxotere Herceptin and Perjeta every 3 weeks (Taxotere was held for elevated LFTs for cycles 1 and 2)   11/28/2016 - 05/06/2018 Chemotherapy   Herceptin maintenance therapy for metastatic breast cancer every 3 weeks   03/24/2018 Surgery   Chronic cholecystitis: Diagnostic laparoscopy: Due to intermittent attacks of biliary colic: The gallbladder was rockhard and not distensible with tumor palpated in the porta hepatis, extensive lucent liver lesions, decision was  made to stop the surgery.   05/12/2018 - 05/22/2018 Radiation Therapy   Radiation to the brain   05/29/2018 -  Chemotherapy   Kadcyla     CHIEF COMPLIANT: Follow-up of metastatic breast cancer on Kadcyla  INTERVAL HISTORY: Monique Figueroa is a 85 y.o. with above-mentioned history of metastatic breast cancer with brain metastasis currently on Kadcyla. Echo on 03/09/19 showed an ejection fraction of 55-60%. She presents to the clinic today for treatment.  She is tolerating the treatment extremely well.  Denies any nausea or vomiting.  REVIEW OF SYSTEMS:   Constitutional: Denies fevers, chills or abnormal weight loss Eyes: Denies blurriness of vision Ears, nose, mouth, throat, and face: Denies mucositis or sore throat Respiratory: Denies cough, dyspnea or wheezes Cardiovascular: Denies palpitation, chest discomfort Gastrointestinal: Denies nausea, heartburn or change in bowel habits Skin: Denies abnormal skin rashes Lymphatics: Denies new lymphadenopathy or easy bruising Neurological: Denies numbness, tingling or new weaknesses Behavioral/Psych: Mood is stable, no new changes  Extremities: No lower extremity edema Breast: denies any pain or lumps or nodules in either breasts All other systems were reviewed with the patient and are negative.  I have reviewed the past medical history, past surgical history, social history and family history with the patient and they are unchanged from previous note.  ALLERGIES:  is allergic to perjeta [pertuzumab] and sulfa antibiotics.  MEDICATIONS:  Current Outpatient Medications  Medication Sig Dispense Refill  . acetaminophen (TYLENOL) 500 MG tablet Take 500 mg by mouth 2 (two) times daily as needed for moderate pain or headache.    . benzocaine (ORAJEL)  10 % mucosal gel Use as directed 1 application in the mouth or throat as needed for mouth pain.    . calcium carbonate (TUMS - DOSED IN MG ELEMENTAL CALCIUM) 500 MG chewable tablet Chew 1 tablet  by mouth 2 (two) times daily as needed for indigestion or heartburn.     . ciprofloxacin (CIPRO) 500 MG tablet     . diphenhydrAMINE (BENADRYL) 25 MG tablet Take 25 mg by mouth daily as needed for allergies.    . hydrochlorothiazide (HYDRODIURIL) 25 MG tablet Take 25 mg by mouth daily.    Marland Kitchen levothyroxine (SYNTHROID) 125 MCG tablet Take 1 tablet (125 mcg total) by mouth daily before breakfast. 30 tablet 0  . Liniments (BLUE-EMU SUPER STRENGTH EX) Apply 1 application topically daily as needed (muscle pain).    . metFORMIN (GLUCOPHAGE) 500 MG tablet Take 500 mg by mouth daily with breakfast.     . omeprazole (PRILOSEC OTC) 20 MG tablet Take 20 mg by mouth daily as needed (acid reflux).     . ondansetron (ZOFRAN-ODT) 8 MG disintegrating tablet TAKE 1 TABLET (8 MG TOTAL) BY MOUTH EVERY 8 (EIGHT) HOURS AS NEEDED FOR NAUSEA OR VOMITING. 20 tablet 1  . ONDANSETRON HCL PO     . oxyCODONE-acetaminophen (PERCOCET/ROXICET) 5-325 MG tablet Take 1 tablet by mouth daily as needed for severe pain.    Marland Kitchen Phenazopyridine HCl (AZO-STANDARD PO) Take 1 tablet by mouth 2 (two) times daily as needed (uti symptoms).    . potassium chloride (MICRO-K) 10 MEQ CR capsule TAKE 1 CAPSULE (10 MEQ TOTAL) BY MOUTH 2 (TWO) TIMES DAILY. 60 capsule 0  . prochlorperazine (COMPAZINE) 10 MG tablet Take 1 tablet (10 mg total) by mouth every 6 (six) hours as needed for nausea or vomiting. 30 tablet 6  . Propylene Glycol (SYSTANE COMPLETE) 0.6 % SOLN Place 1 drop into both eyes 2 (two) times daily.    . sodium chloride (OCEAN) 0.65 % SOLN nasal spray Place 1 spray into both nostrils as needed for congestion.     No current facility-administered medications for this visit.     PHYSICAL EXAMINATION: ECOG PERFORMANCE STATUS: 2 - Symptomatic, <50% confined to bed  Vitals:   04/08/19 1100  BP: 130/74  Pulse: 79  Resp: 17  Temp: 98.9 F (37.2 C)  SpO2: 97%   Filed Weights   04/08/19 1100  Weight: 149 lb 14.4 oz (68 kg)     GENERAL: alert, no distress and comfortable SKIN: skin color, texture, turgor are normal, no rashes or significant lesions EYES: normal, Conjunctiva are pink and non-injected, sclera clear OROPHARYNX: no exudate, no erythema and lips, buccal mucosa, and tongue normal  NECK: supple, thyroid normal size, non-tender, without nodularity LYMPH: no palpable lymphadenopathy in the cervical, axillary or inguinal LUNGS: clear to auscultation and percussion with normal breathing effort HEART: regular rate & rhythm and no murmurs and no lower extremity edema ABDOMEN: abdomen soft, non-tender and normal bowel sounds MUSCULOSKELETAL: no cyanosis of digits and no clubbing  NEURO: alert & oriented x 3 with fluent speech, no focal motor/sensory deficits EXTREMITIES: No lower extremity edema  LABORATORY DATA:  I have reviewed the data as listed CMP Latest Ref Rng & Units 03/18/2019 03/01/2019 02/25/2019  Glucose 70 - 99 mg/dL 118(H) - 195(H)  BUN 8 - 23 mg/dL '15 11 13  '$ Creatinine 0.44 - 1.00 mg/dL 0.74 0.79 0.74  Sodium 135 - 145 mmol/L 140 - 137  Potassium 3.5 - 5.1 mmol/L 3.4(L) - 3.7  Chloride 98 - 111 mmol/L 107 - 104  CO2 22 - 32 mmol/L 24 - 25  Calcium 8.9 - 10.3 mg/dL 8.3(L) - 8.5(L)  Total Protein 6.5 - 8.1 g/dL 6.2(L) - 6.7  Total Bilirubin 0.3 - 1.2 mg/dL 1.1 - 1.0  Alkaline Phos 38 - 126 U/L 74 - 85  AST 15 - 41 U/L 36 - 43(H)  ALT 0 - 44 U/L 13 - 15    Lab Results  Component Value Date   WBC 4.2 04/08/2019   HGB 12.4 04/08/2019   HCT 36.4 04/08/2019   MCV 91.7 04/08/2019   PLT 125 (L) 04/08/2019   NEUTROABS 2.7 04/08/2019    ASSESSMENT & PLAN:  Metastatic breast cancer ([place]) Metastatic breast cancer with innumerable liver metastases detected on ultrasound and recent MRI of the liver on 04/26/2016 (Prior history of left breast IDC T1 1 N0 stage IA 0.3 cm grade 1 tumor that was ER 38%, PR 93%, Ki-67 10%, HER-2 2+, no tissue for FISH testing, 0/1 lymph node negative, status post  lumpectomy radiation and 5 years of Arimidex completed in February 2013)  PET/CT scan: 05/07/2016: Extensive metastatic involvement of both lobes of the liver, primary could be liver, phalangeal or metastatic disease, hypermetabolic thoracic lymph nodes left axillary, left supraclavicular and right CP angle lymph nodes, hypermetabolic upper abdominal lymph nodes  Liver biopsy10/07/2016: Metastatic carcinoma breast primary strong positivity for CK 7 weak focal positivity for ER, GCDFP, negative for PR, CDX 2, TTF-1, WT 1; HER-2 positive  Treatment plan: 1. Taxotere Herceptin and Perjeta every 3 weeks palliative chemotherapy started 06/13/16 (Perjeta discontinued after 5 cycles due to allergy) 2. followed by Herceptin maintenance stopped 05/08/2018 due to progression, Kadcyla started --------------------------------------------------------------------------------------------------------------------------------------- 10/14/2018: ERCP with stent placement  Current treatment: Kadcyla starting 10/25/2019todayiscycle16 Patient completed palliative radiation therapy to the brain Echo4/30/2020 shows EF of 60-65%  No clinical signs of metastatic breast cancer progression. Kadcyla toxicities: No side effects to Kadcyla.Will continue.  CT CAP: 02/22/2019: Slight interval decrease in the size of the liver metastases 5.4 cm to 5.1 cm, additional nodules stable Brain MRI 02/18/2019: New 4 mm metastases left posterior frontal vertex, favorable evaluation of treated cerebellar disease Patient is going to get stereotactic radiosurgery for the new lesion  Since she is responding very well up with systemic therapy, will continue Kadcyla every 3 weeks. I will see her every 6 weeks for follow-ups. Next scan will be in January 2021.   No orders of the defined types were placed in this encounter.  The patient has a good understanding of the overall plan. she agrees with it. she will call with any  problems that may develop before the next visit here.  Nicholas Lose, MD 04/08/2019  Julious Oka Dorshimer am acting as scribe for Dr. Nicholas Lose.  I have reviewed the above documentation for accuracy and completeness, and I agree with the above.

## 2019-04-08 ENCOUNTER — Inpatient Hospital Stay: Payer: Medicare Other

## 2019-04-08 ENCOUNTER — Inpatient Hospital Stay (HOSPITAL_BASED_OUTPATIENT_CLINIC_OR_DEPARTMENT_OTHER): Payer: Medicare Other | Admitting: Hematology and Oncology

## 2019-04-08 ENCOUNTER — Other Ambulatory Visit: Payer: Self-pay

## 2019-04-08 ENCOUNTER — Inpatient Hospital Stay: Payer: Medicare Other | Attending: Hematology and Oncology

## 2019-04-08 DIAGNOSIS — Z853 Personal history of malignant neoplasm of breast: Secondary | ICD-10-CM | POA: Diagnosis not present

## 2019-04-08 DIAGNOSIS — Z9221 Personal history of antineoplastic chemotherapy: Secondary | ICD-10-CM | POA: Insufficient documentation

## 2019-04-08 DIAGNOSIS — Z79899 Other long term (current) drug therapy: Secondary | ICD-10-CM | POA: Diagnosis not present

## 2019-04-08 DIAGNOSIS — C801 Malignant (primary) neoplasm, unspecified: Secondary | ICD-10-CM | POA: Diagnosis not present

## 2019-04-08 DIAGNOSIS — C50919 Malignant neoplasm of unspecified site of unspecified female breast: Secondary | ICD-10-CM

## 2019-04-08 DIAGNOSIS — Z7984 Long term (current) use of oral hypoglycemic drugs: Secondary | ICD-10-CM | POA: Insufficient documentation

## 2019-04-08 DIAGNOSIS — Z95828 Presence of other vascular implants and grafts: Secondary | ICD-10-CM

## 2019-04-08 DIAGNOSIS — C7931 Secondary malignant neoplasm of brain: Secondary | ICD-10-CM | POA: Diagnosis not present

## 2019-04-08 DIAGNOSIS — Z923 Personal history of irradiation: Secondary | ICD-10-CM | POA: Insufficient documentation

## 2019-04-08 DIAGNOSIS — Z5112 Encounter for antineoplastic immunotherapy: Secondary | ICD-10-CM | POA: Insufficient documentation

## 2019-04-08 LAB — CMP (CANCER CENTER ONLY)
ALT: 13 U/L (ref 0–44)
AST: 37 U/L (ref 15–41)
Albumin: 2.9 g/dL — ABNORMAL LOW (ref 3.5–5.0)
Alkaline Phosphatase: 74 U/L (ref 38–126)
Anion gap: 9 (ref 5–15)
BUN: 13 mg/dL (ref 8–23)
CO2: 26 mmol/L (ref 22–32)
Calcium: 8.3 mg/dL — ABNORMAL LOW (ref 8.9–10.3)
Chloride: 106 mmol/L (ref 98–111)
Creatinine: 0.65 mg/dL (ref 0.44–1.00)
GFR, Est AFR Am: >60 mL/min (ref >60)
GFR, Estimated: >60 mL/min (ref >60)
Glucose, Bld: 97 mg/dL (ref 70–99)
Potassium: 3.6 mmol/L (ref 3.5–5.1)
Sodium: 141 mmol/L (ref 135–145)
Total Bilirubin: 1.2 mg/dL (ref 0.3–1.2)
Total Protein: 6.1 g/dL — ABNORMAL LOW (ref 6.5–8.1)

## 2019-04-08 LAB — CBC WITH DIFFERENTIAL (CANCER CENTER ONLY)
Abs Immature Granulocytes: 0.01 10*3/uL (ref 0.00–0.07)
Basophils Absolute: 0 10*3/uL (ref 0.0–0.1)
Basophils Relative: 1 %
Eosinophils Absolute: 0.1 10*3/uL (ref 0.0–0.5)
Eosinophils Relative: 3 %
HCT: 36.4 % (ref 36.0–46.0)
Hemoglobin: 12.4 g/dL (ref 12.0–15.0)
Immature Granulocytes: 0 %
Lymphocytes Relative: 21 %
Lymphs Abs: 0.9 10*3/uL (ref 0.7–4.0)
MCH: 31.2 pg (ref 26.0–34.0)
MCHC: 34.1 g/dL (ref 30.0–36.0)
MCV: 91.7 fL (ref 80.0–100.0)
Monocytes Absolute: 0.5 10*3/uL (ref 0.1–1.0)
Monocytes Relative: 12 %
Neutro Abs: 2.7 10*3/uL (ref 1.7–7.7)
Neutrophils Relative %: 63 %
Platelet Count: 125 10*3/uL — ABNORMAL LOW (ref 150–400)
RBC: 3.97 MIL/uL (ref 3.87–5.11)
RDW: 17 % — ABNORMAL HIGH (ref 11.5–15.5)
WBC Count: 4.2 10*3/uL (ref 4.0–10.5)
nRBC: 0 % (ref 0.0–0.2)

## 2019-04-08 MED ORDER — DIPHENHYDRAMINE HCL 25 MG PO CAPS
ORAL_CAPSULE | ORAL | Status: AC
  Filled 2019-04-08: qty 2

## 2019-04-08 MED ORDER — SODIUM CHLORIDE 0.9% FLUSH
10.0000 mL | INTRAVENOUS | Status: DC | PRN
  Administered 2019-04-08: 10 mL
  Filled 2019-04-08: qty 10

## 2019-04-08 MED ORDER — HEPARIN SOD (PORK) LOCK FLUSH 100 UNIT/ML IV SOLN
500.0000 [IU] | Freq: Once | INTRAVENOUS | Status: AC | PRN
  Administered 2019-04-08: 500 [IU]
  Filled 2019-04-08: qty 5

## 2019-04-08 MED ORDER — SODIUM CHLORIDE 0.9% FLUSH
10.0000 mL | INTRAVENOUS | Status: DC | PRN
  Administered 2019-04-08: 11:00:00 10 mL via INTRAVENOUS
  Filled 2019-04-08: qty 10

## 2019-04-08 MED ORDER — ACETAMINOPHEN 325 MG PO TABS
650.0000 mg | ORAL_TABLET | Freq: Once | ORAL | Status: AC
  Administered 2019-04-08: 13:00:00 650 mg via ORAL

## 2019-04-08 MED ORDER — SODIUM CHLORIDE 0.9 % IV SOLN
3.0000 mg/kg | Freq: Once | INTRAVENOUS | Status: AC
  Administered 2019-04-08: 200 mg via INTRAVENOUS
  Filled 2019-04-08: qty 10

## 2019-04-08 MED ORDER — ACETAMINOPHEN 325 MG PO TABS
ORAL_TABLET | ORAL | Status: AC
  Filled 2019-04-08: qty 2

## 2019-04-08 MED ORDER — DIPHENHYDRAMINE HCL 25 MG PO CAPS
50.0000 mg | ORAL_CAPSULE | Freq: Once | ORAL | Status: AC
  Administered 2019-04-08: 13:00:00 50 mg via ORAL

## 2019-04-08 MED ORDER — SODIUM CHLORIDE 0.9 % IV SOLN
Freq: Once | INTRAVENOUS | Status: AC
  Administered 2019-04-08: 12:00:00 via INTRAVENOUS
  Filled 2019-04-08: qty 250

## 2019-04-08 NOTE — Patient Instructions (Signed)
[facility] Discharge Instructions for Patients Receiving Chemotherapy  Today you received the following immunotherapy agent:  Kadcyla  To help prevent nausea and vomiting after your treatment, we encourage you to take your nausea medication as needed. If you develop nausea and vomiting that is not controlled by your nausea medication, call the clinic.   BELOW ARE SYMPTOMS THAT SHOULD BE REPORTED IMMEDIATELY:  *FEVER GREATER THAN 100.5 F  *CHILLS WITH OR WITHOUT FEVER  NAUSEA AND VOMITING THAT IS NOT CONTROLLED WITH YOUR NAUSEA MEDICATION  *UNUSUAL SHORTNESS OF BREATH  *UNUSUAL BRUISING OR BLEEDING  TENDERNESS IN MOUTH AND THROAT WITH OR WITHOUT PRESENCE OF ULCERS  *URINARY PROBLEMS  *BOWEL PROBLEMS  UNUSUAL RASH Items with * indicate a potential emergency and should be followed up as soon as possible.  Feel free to call the clinic should you have any questions or concerns. The clinic phone number is [phone].  Please show the [place] at check-in to the Emergency Department and triage nurse.

## 2019-04-29 ENCOUNTER — Inpatient Hospital Stay: Payer: Medicare Other

## 2019-04-29 ENCOUNTER — Other Ambulatory Visit: Payer: Self-pay | Admitting: *Deleted

## 2019-04-29 ENCOUNTER — Other Ambulatory Visit: Payer: Self-pay

## 2019-04-29 VITALS — BP 132/71 | HR 92 | Temp 98.7°F | Resp 16 | Ht 65.0 in | Wt 150.5 lb

## 2019-04-29 DIAGNOSIS — C50919 Malignant neoplasm of unspecified site of unspecified female breast: Secondary | ICD-10-CM

## 2019-04-29 DIAGNOSIS — C801 Malignant (primary) neoplasm, unspecified: Secondary | ICD-10-CM | POA: Diagnosis not present

## 2019-04-29 DIAGNOSIS — Z9221 Personal history of antineoplastic chemotherapy: Secondary | ICD-10-CM | POA: Diagnosis not present

## 2019-04-29 DIAGNOSIS — Z5112 Encounter for antineoplastic immunotherapy: Secondary | ICD-10-CM | POA: Diagnosis not present

## 2019-04-29 DIAGNOSIS — Z7984 Long term (current) use of oral hypoglycemic drugs: Secondary | ICD-10-CM | POA: Diagnosis not present

## 2019-04-29 DIAGNOSIS — Z853 Personal history of malignant neoplasm of breast: Secondary | ICD-10-CM | POA: Diagnosis not present

## 2019-04-29 DIAGNOSIS — Z95828 Presence of other vascular implants and grafts: Secondary | ICD-10-CM

## 2019-04-29 DIAGNOSIS — Z923 Personal history of irradiation: Secondary | ICD-10-CM | POA: Diagnosis not present

## 2019-04-29 DIAGNOSIS — C7931 Secondary malignant neoplasm of brain: Secondary | ICD-10-CM | POA: Diagnosis not present

## 2019-04-29 DIAGNOSIS — Z79899 Other long term (current) drug therapy: Secondary | ICD-10-CM | POA: Diagnosis not present

## 2019-04-29 LAB — CMP (CANCER CENTER ONLY)
ALT: 12 U/L (ref 0–44)
AST: 39 U/L (ref 15–41)
Albumin: 3 g/dL — ABNORMAL LOW (ref 3.5–5.0)
Alkaline Phosphatase: 82 U/L (ref 38–126)
Anion gap: 9 (ref 5–15)
BUN: 12 mg/dL (ref 8–23)
CO2: 27 mmol/L (ref 22–32)
Calcium: 8.6 mg/dL — ABNORMAL LOW (ref 8.9–10.3)
Chloride: 103 mmol/L (ref 98–111)
Creatinine: 0.66 mg/dL (ref 0.44–1.00)
GFR, Est AFR Am: >60 mL/min (ref >60)
GFR, Estimated: >60 mL/min (ref >60)
Glucose, Bld: 144 mg/dL — ABNORMAL HIGH (ref 70–99)
Potassium: 3.2 mmol/L — ABNORMAL LOW (ref 3.5–5.1)
Sodium: 139 mmol/L (ref 135–145)
Total Bilirubin: 1.2 mg/dL (ref 0.3–1.2)
Total Protein: 6.3 g/dL — ABNORMAL LOW (ref 6.5–8.1)

## 2019-04-29 LAB — CBC WITH DIFFERENTIAL (CANCER CENTER ONLY)
Abs Immature Granulocytes: 0.03 10*3/uL (ref 0.00–0.07)
Basophils Absolute: 0 10*3/uL (ref 0.0–0.1)
Basophils Relative: 1 %
Eosinophils Absolute: 0.1 10*3/uL (ref 0.0–0.5)
Eosinophils Relative: 2 %
HCT: 37 % (ref 36.0–46.0)
Hemoglobin: 12.5 g/dL (ref 12.0–15.0)
Immature Granulocytes: 1 %
Lymphocytes Relative: 16 %
Lymphs Abs: 0.8 10*3/uL (ref 0.7–4.0)
MCH: 31.2 pg (ref 26.0–34.0)
MCHC: 33.8 g/dL (ref 30.0–36.0)
MCV: 92.3 fL (ref 80.0–100.0)
Monocytes Absolute: 0.5 10*3/uL (ref 0.1–1.0)
Monocytes Relative: 11 %
Neutro Abs: 3.4 10*3/uL (ref 1.7–7.7)
Neutrophils Relative %: 69 %
Platelet Count: 133 10*3/uL — ABNORMAL LOW (ref 150–400)
RBC: 4.01 MIL/uL (ref 3.87–5.11)
RDW: 16.8 % — ABNORMAL HIGH (ref 11.5–15.5)
WBC Count: 4.9 10*3/uL (ref 4.0–10.5)
nRBC: 0 % (ref 0.0–0.2)

## 2019-04-29 MED ORDER — HEPARIN SOD (PORK) LOCK FLUSH 100 UNIT/ML IV SOLN
500.0000 [IU] | Freq: Once | INTRAVENOUS | Status: AC | PRN
  Administered 2019-04-29: 500 [IU]
  Filled 2019-04-29: qty 5

## 2019-04-29 MED ORDER — SODIUM CHLORIDE 0.9% FLUSH
10.0000 mL | INTRAVENOUS | Status: DC | PRN
  Administered 2019-04-29: 13:00:00 10 mL via INTRAVENOUS
  Filled 2019-04-29: qty 10

## 2019-04-29 MED ORDER — LIDOCAINE-PRILOCAINE 2.5-2.5 % EX CREA: 1.0000 "application " | TOPICAL_CREAM | CUTANEOUS | 0 refills | Status: AC | PRN

## 2019-04-29 MED ORDER — DIPHENHYDRAMINE HCL 25 MG PO CAPS
50.0000 mg | ORAL_CAPSULE | Freq: Once | ORAL | Status: AC
  Administered 2019-04-29: 50 mg via ORAL

## 2019-04-29 MED ORDER — SODIUM CHLORIDE 0.9% FLUSH
10.0000 mL | INTRAVENOUS | Status: DC | PRN
  Administered 2019-04-29: 10 mL
  Filled 2019-04-29: qty 10

## 2019-04-29 MED ORDER — ACETAMINOPHEN 325 MG PO TABS
650.0000 mg | ORAL_TABLET | Freq: Once | ORAL | Status: AC
  Administered 2019-04-29: 650 mg via ORAL

## 2019-04-29 MED ORDER — DIPHENHYDRAMINE HCL 25 MG PO CAPS
ORAL_CAPSULE | ORAL | Status: AC
  Filled 2019-04-29: qty 2

## 2019-04-29 MED ORDER — SODIUM CHLORIDE 0.9 % IV SOLN
3.0000 mg/kg | Freq: Once | INTRAVENOUS | Status: AC
  Administered 2019-04-29: 14:00:00 200 mg via INTRAVENOUS
  Filled 2019-04-29: qty 10

## 2019-04-29 MED ORDER — SODIUM CHLORIDE 0.9 % IV SOLN
Freq: Once | INTRAVENOUS | Status: AC
  Administered 2019-04-29: 13:00:00 via INTRAVENOUS
  Filled 2019-04-29: qty 250

## 2019-04-29 MED ORDER — ACETAMINOPHEN 325 MG PO TABS
ORAL_TABLET | ORAL | Status: AC
  Filled 2019-04-29: qty 2

## 2019-04-29 NOTE — Patient Instructions (Signed)
[facility] Discharge Instructions for Patients Receiving Chemotherapy  Today you received the following chemotherapy agents:  [place] - trastuzamab emtansine  To help prevent nausea and vomiting after your treatment, we encourage you to take your nausea medication as prescribed.   If you develop nausea and vomiting that is not controlled by your nausea medication, call the clinic.   BELOW ARE SYMPTOMS THAT SHOULD BE REPORTED IMMEDIATELY:  *FEVER GREATER THAN 100.5 F  *CHILLS WITH OR WITHOUT FEVER  NAUSEA AND VOMITING THAT IS NOT CONTROLLED WITH YOUR NAUSEA MEDICATION  *UNUSUAL SHORTNESS OF BREATH  *UNUSUAL BRUISING OR BLEEDING  TENDERNESS IN MOUTH AND THROAT WITH OR WITHOUT PRESENCE OF ULCERS  *URINARY PROBLEMS  *BOWEL PROBLEMS  UNUSUAL RASH Items with * indicate a potential emergency and should be followed up as soon as possible.  Feel free to call the clinic should you have any questions or concerns. The clinic phone number is [phone].  Please show the [place] at check-in to the Emergency Department and triage nurse.

## 2019-04-29 NOTE — Patient Instructions (Signed)

## 2019-05-19 ENCOUNTER — Other Ambulatory Visit: Payer: Self-pay

## 2019-05-19 DIAGNOSIS — C50919 Malignant neoplasm of unspecified site of unspecified female breast: Secondary | ICD-10-CM

## 2019-05-19 NOTE — Progress Notes (Signed)
Patient Care Team: [name], MD as PCP - General (Internal Medicine)  DIAGNOSIS:    ICD-10-CM   1. Metastatic breast cancer ([place])  C50.919     SUMMARY OF ONCOLOGIC HISTORY: Oncology History  Metastatic breast cancer ([place])  06/05/2006 Initial Diagnosis   Left breast biopsy: DCIS with apocrine features ER 30%, PR 13%   06/19/2006 Surgery   Left lumpectomy: stage I, pT1a, pN0(i)(sn), pMX, 0.3 cm IDC, grade 1, with extensive high-grade DCIS , margins negative ER 38 %, PR 93 %, Ki-67 10%, HER-2/neu 2+, no additional tissue available for FISH testing, with 0/1 left axillary lymph nodes.   07/30/2006 - 09/17/2006 Radiation Therapy   Adjuvant radiation therapy   09/24/2006 - 09/25/2011 Anti-estrogen oral therapy   Antiestrogen therapy with Arimidex 5 years   04/26/2016 Relapse/Recurrence   Innumerable hepatic lesions seen in both lobes most are 1-2 cm size somewhat confluent largest in the posterior right hepatic dome 3 x 4.6 cm   05/07/2016 PET scan   PET/CT scan: Extensive metastatic involvement of both lobes of the liver, primary could be liver, phalangeal or metastatic disease, hypermetabolic thoracic LN left axillary, left supraclavicular and right CP angle LN, hypermetabolic upper abdominal LN   05/16/2016 Initial Biopsy   Liver biopsy: Metastatic carcinoma breast primary strong positivity for CK 7 weak focal positivity for ER, GCDFP, negative for PR, CDX 2, TTF-1, WT 1; HER-2 positive   06/13/2016 - 11/07/2016 Chemotherapy   Taxotere Herceptin and Perjeta every 3 weeks (Taxotere was held for elevated LFTs for cycles 1 and 2)   11/28/2016 - 05/06/2018 Chemotherapy   Herceptin maintenance therapy for metastatic breast cancer every 3 weeks   03/24/2018 Surgery   Chronic cholecystitis: Diagnostic laparoscopy: Due to intermittent attacks of biliary colic: The gallbladder was rockhard and not distensible with tumor palpated in the porta hepatis, extensive lucent liver lesions, decision was  made to stop the surgery.   05/12/2018 - 05/22/2018 Radiation Therapy   Radiation to the brain   05/29/2018 -  Chemotherapy   Kadcyla     CHIEF COMPLIANT: Follow-up of metastatic breast cancer on Kadcyla  INTERVAL HISTORY: Monique Figueroa is a 85 y.o. with above-mentioned history of metastatic breast cancer with brain metastasis currently on Kadcyla. She presents to the clinic today for treatment.   She denies peripheral neuropathy.  Denies any nausea or vomiting.  REVIEW OF SYSTEMS:   Constitutional: Denies fevers, chills or abnormal weight loss Eyes: Denies blurriness of vision Ears, nose, mouth, throat, and face: Denies mucositis or sore throat Respiratory: Denies cough, dyspnea or wheezes Cardiovascular: Denies palpitation, chest discomfort Gastrointestinal: Denies nausea, heartburn or change in bowel habits Skin: Denies abnormal skin rashes Lymphatics: Denies new lymphadenopathy or easy bruising Neurological: Denies numbness, tingling or new weaknesses Behavioral/Psych: Mood is stable, no new changes  Extremities: No lower extremity edema Breast: denies any pain or lumps or nodules in either breasts All other systems were reviewed with the patient and are negative.  I have reviewed the past medical history, past surgical history, social history and family history with the patient and they are unchanged from previous note.  ALLERGIES:  is allergic to perjeta [pertuzumab] and sulfa antibiotics.  MEDICATIONS:  Current Outpatient Medications  Medication Sig Dispense Refill  . acetaminophen (TYLENOL) 500 MG tablet Take 500 mg by mouth 2 (two) times daily as needed for moderate pain or headache.    . benzocaine (ORAJEL) 10 % mucosal gel Use as directed 1 application in the  mouth or throat as needed for mouth pain.    . calcium carbonate (TUMS - DOSED IN MG ELEMENTAL CALCIUM) 500 MG chewable tablet Chew 1 tablet by mouth 2 (two) times daily as needed for indigestion or heartburn.      . ciprofloxacin (CIPRO) 500 MG tablet     . diphenhydrAMINE (BENADRYL) 25 MG tablet Take 25 mg by mouth daily as needed for allergies.    . hydrochlorothiazide (HYDRODIURIL) 25 MG tablet Take 25 mg by mouth daily.    Marland Kitchen levothyroxine (SYNTHROID) 125 MCG tablet Take 1 tablet (125 mcg total) by mouth daily before breakfast. 30 tablet 0  . lidocaine-prilocaine (EMLA) cream Apply 1 application topically as needed. 30 g 0  . Liniments (BLUE-EMU SUPER STRENGTH EX) Apply 1 application topically daily as needed (muscle pain).    . metFORMIN (GLUCOPHAGE) 500 MG tablet Take 500 mg by mouth daily with breakfast.     . omeprazole (PRILOSEC OTC) 20 MG tablet Take 20 mg by mouth daily as needed (acid reflux).     . ondansetron (ZOFRAN-ODT) 8 MG disintegrating tablet TAKE 1 TABLET (8 MG TOTAL) BY MOUTH EVERY 8 (EIGHT) HOURS AS NEEDED FOR NAUSEA OR VOMITING. 20 tablet 1  . ONDANSETRON HCL PO     . oxyCODONE-acetaminophen (PERCOCET/ROXICET) 5-325 MG tablet Take 1 tablet by mouth daily as needed for severe pain.    Marland Kitchen Phenazopyridine HCl (AZO-STANDARD PO) Take 1 tablet by mouth 2 (two) times daily as needed (uti symptoms).    . potassium chloride (MICRO-K) 10 MEQ CR capsule TAKE 1 CAPSULE (10 MEQ TOTAL) BY MOUTH 2 (TWO) TIMES DAILY. 60 capsule 0  . prochlorperazine (COMPAZINE) 10 MG tablet Take 1 tablet (10 mg total) by mouth every 6 (six) hours as needed for nausea or vomiting. 30 tablet 6  . Propylene Glycol (SYSTANE COMPLETE) 0.6 % SOLN Place 1 drop into both eyes 2 (two) times daily.    . sodium chloride (OCEAN) 0.65 % SOLN nasal spray Place 1 spray into both nostrils as needed for congestion.     No current facility-administered medications for this visit.     PHYSICAL EXAMINATION: ECOG PERFORMANCE STATUS: 1 - Symptomatic but completely ambulatory  Vitals:   05/20/19 1351  BP: (!) 143/64  Pulse: 95  Resp: 16  Temp: 98.5 F (36.9 C)  SpO2: 97%   Filed Weights   05/20/19 1351  Weight: 146 lb  11.2 oz (66.5 kg)    GENERAL: alert, no distress and comfortable SKIN: skin color, texture, turgor are normal, no rashes or significant lesions EYES: normal, Conjunctiva are pink and non-injected, sclera clear OROPHARYNX: no exudate, no erythema and lips, buccal mucosa, and tongue normal  NECK: supple, thyroid normal size, non-tender, without nodularity LYMPH: no palpable lymphadenopathy in the cervical, axillary or inguinal LUNGS: clear to auscultation and percussion with normal breathing effort HEART: regular rate & rhythm and no murmurs and no lower extremity edema ABDOMEN: abdomen soft, non-tender and normal bowel sounds MUSCULOSKELETAL: no cyanosis of digits and no clubbing  NEURO: alert & oriented x 3 with fluent speech, no focal motor/sensory deficits EXTREMITIES: No lower extremity edema  LABORATORY DATA:  I have reviewed the data as listed CMP Latest Ref Rng & Units 05/20/2019 04/29/2019 04/08/2019  Glucose 70 - 99 mg/dL 138(H) 144(H) 97  BUN 8 - 23 mg/dL '14 12 13  '$ Creatinine 0.44 - 1.00 mg/dL 0.75 0.66 0.65  Sodium 135 - 145 mmol/L 140 139 141  Potassium 3.5 - 5.1 mmol/L  3.5 3.2(L) 3.6  Chloride 98 - 111 mmol/L 105 103 106  CO2 22 - 32 mmol/L '27 27 26  '$ Calcium 8.9 - 10.3 mg/dL 8.5(L) 8.6(L) 8.3(L)  Total Protein 6.5 - 8.1 g/dL 6.4(L) 6.3(L) 6.1(L)  Total Bilirubin 0.3 - 1.2 mg/dL 1.2 1.2 1.2  Alkaline Phos 38 - 126 U/L 83 82 74  AST 15 - 41 U/L 39 39 37  ALT 0 - 44 U/L '13 12 13    '$ Lab Results  Component Value Date   WBC 4.4 05/20/2019   HGB 12.2 05/20/2019   HCT 36.7 05/20/2019   MCV 91.5 05/20/2019   PLT 126 (L) 05/20/2019   NEUTROABS 3.1 05/20/2019    ASSESSMENT & PLAN:  Metastatic breast cancer ([place]) Metastatic breast cancer with innumerable liver metastases detected on ultrasound and recent MRI of the liver on 04/26/2016 (Prior history of left breast IDC T1 1 N0 stage IA 0.3 cm grade 1 tumor that was ER 38%, PR 93%, Ki-67 10%, HER-2 2+, no tissue for FISH  testing, 0/1 lymph node negative, status post lumpectomy radiation and 5 years of Arimidex completed in February 2013)  PET/CT scan: 05/07/2016: Extensive metastatic involvement of both lobes of the liver, primary could be liver, phalangeal or metastatic disease, hypermetabolic thoracic lymph nodes left axillary, left supraclavicular and right CP angle lymph nodes, hypermetabolic upper abdominal lymph nodes  Liver biopsy10/07/2016: Metastatic carcinoma breast primary strong positivity for CK 7 weak focal positivity for ER, GCDFP, negative for PR, CDX 2, TTF-1, WT 1; HER-2 positive  Treatment plan: 1. Taxotere Herceptin and Perjeta every 3 weeks palliative chemotherapy started 06/13/16 (Perjeta discontinued after 5 cycles due to allergy) 2. followed by Herceptin maintenance stopped 05/08/2018 due to progression, Kadcyla started --------------------------------------------------------------------------------------------------------------------------------------- 10/14/2018: ERCP with stent placement  Current treatment: Kadcyla starting 10/25/2019todayiscycle16 Patient completed palliative radiation therapy to the brain Echo4/30/2020 shows EF of 60-65%  No clinical signs of metastatic breast cancer progression. Kadcyla toxicities: No side effects to Kadcyla.Will continue.  CT CAP: 02/22/2019:Slight interval decrease in the size of the liver metastases 5.4 cm to 5.1 cm, additional nodules stable Brain MRI 02/18/2019: New 4 mm metastases left posterior frontal vertex, favorable evaluation of treated cerebellar disease, status post SRS  Since she is responding very well up with systemic therapy, will continue Kadcyla every 3 weeks. I will see her every 6 weeks for follow-ups. Next scan will be in January 2021.    No orders of the defined types were placed in this encounter.  The patient has a good understanding of the overall plan. she agrees with it. she will call with any  problems that may develop before the next visit here.  Nicholas Lose, MD 05/20/2019  Monique Figueroa am acting as scribe for Dr. Nicholas Lose.  I have reviewed the above documentation for accuracy and completeness, and I agree with the above.

## 2019-05-20 ENCOUNTER — Inpatient Hospital Stay: Payer: Medicare Other

## 2019-05-20 ENCOUNTER — Other Ambulatory Visit: Payer: Self-pay

## 2019-05-20 ENCOUNTER — Inpatient Hospital Stay: Payer: Medicare Other | Admitting: Hematology and Oncology

## 2019-05-20 ENCOUNTER — Inpatient Hospital Stay: Payer: Medicare Other | Attending: Hematology and Oncology

## 2019-05-20 DIAGNOSIS — Z5112 Encounter for antineoplastic immunotherapy: Secondary | ICD-10-CM | POA: Insufficient documentation

## 2019-05-20 DIAGNOSIS — Z7984 Long term (current) use of oral hypoglycemic drugs: Secondary | ICD-10-CM | POA: Insufficient documentation

## 2019-05-20 DIAGNOSIS — Z9221 Personal history of antineoplastic chemotherapy: Secondary | ICD-10-CM | POA: Diagnosis not present

## 2019-05-20 DIAGNOSIS — C7931 Secondary malignant neoplasm of brain: Secondary | ICD-10-CM | POA: Diagnosis not present

## 2019-05-20 DIAGNOSIS — C50919 Malignant neoplasm of unspecified site of unspecified female breast: Secondary | ICD-10-CM

## 2019-05-20 DIAGNOSIS — Z79899 Other long term (current) drug therapy: Secondary | ICD-10-CM | POA: Insufficient documentation

## 2019-05-20 DIAGNOSIS — C787 Secondary malignant neoplasm of liver and intrahepatic bile duct: Secondary | ICD-10-CM | POA: Diagnosis not present

## 2019-05-20 DIAGNOSIS — Z923 Personal history of irradiation: Secondary | ICD-10-CM | POA: Insufficient documentation

## 2019-05-20 DIAGNOSIS — Z95828 Presence of other vascular implants and grafts: Secondary | ICD-10-CM

## 2019-05-20 LAB — CBC WITH DIFFERENTIAL (CANCER CENTER ONLY)
Abs Immature Granulocytes: 0.01 10*3/uL (ref 0.00–0.07)
Basophils Absolute: 0 10*3/uL (ref 0.0–0.1)
Basophils Relative: 1 %
Eosinophils Absolute: 0.1 10*3/uL (ref 0.0–0.5)
Eosinophils Relative: 2 %
HCT: 36.7 % (ref 36.0–46.0)
Hemoglobin: 12.2 g/dL (ref 12.0–15.0)
Immature Granulocytes: 0 %
Lymphocytes Relative: 16 %
Lymphs Abs: 0.7 10*3/uL (ref 0.7–4.0)
MCH: 30.4 pg (ref 26.0–34.0)
MCHC: 33.2 g/dL (ref 30.0–36.0)
MCV: 91.5 fL (ref 80.0–100.0)
Monocytes Absolute: 0.5 10*3/uL (ref 0.1–1.0)
Monocytes Relative: 11 %
Neutro Abs: 3.1 10*3/uL (ref 1.7–7.7)
Neutrophils Relative %: 70 %
Platelet Count: 126 10*3/uL — ABNORMAL LOW (ref 150–400)
RBC: 4.01 MIL/uL (ref 3.87–5.11)
RDW: 16.1 % — ABNORMAL HIGH (ref 11.5–15.5)
WBC Count: 4.4 10*3/uL (ref 4.0–10.5)
nRBC: 0 % (ref 0.0–0.2)

## 2019-05-20 LAB — CMP (CANCER CENTER ONLY)
ALT: 13 U/L (ref 0–44)
AST: 39 U/L (ref 15–41)
Albumin: 2.8 g/dL — ABNORMAL LOW (ref 3.5–5.0)
Alkaline Phosphatase: 83 U/L (ref 38–126)
Anion gap: 8 (ref 5–15)
BUN: 14 mg/dL (ref 8–23)
CO2: 27 mmol/L (ref 22–32)
Calcium: 8.5 mg/dL — ABNORMAL LOW (ref 8.9–10.3)
Chloride: 105 mmol/L (ref 98–111)
Creatinine: 0.75 mg/dL (ref 0.44–1.00)
GFR, Est AFR Am: >60 mL/min (ref >60)
GFR, Estimated: >60 mL/min (ref >60)
Glucose, Bld: 138 mg/dL — ABNORMAL HIGH (ref 70–99)
Potassium: 3.5 mmol/L (ref 3.5–5.1)
Sodium: 140 mmol/L (ref 135–145)
Total Bilirubin: 1.2 mg/dL (ref 0.3–1.2)
Total Protein: 6.4 g/dL — ABNORMAL LOW (ref 6.5–8.1)

## 2019-05-20 MED ORDER — SODIUM CHLORIDE 0.9 % IV SOLN
Freq: Once | INTRAVENOUS | Status: AC
  Administered 2019-05-20: 15:00:00 via INTRAVENOUS
  Filled 2019-05-20: qty 250

## 2019-05-20 MED ORDER — DIPHENHYDRAMINE HCL 25 MG PO CAPS
50.0000 mg | ORAL_CAPSULE | Freq: Once | ORAL | Status: AC
  Administered 2019-05-20: 50 mg via ORAL

## 2019-05-20 MED ORDER — ACETAMINOPHEN 325 MG PO TABS
ORAL_TABLET | ORAL | Status: AC
  Filled 2019-05-20: qty 2

## 2019-05-20 MED ORDER — SODIUM CHLORIDE 0.9 % IV SOLN
3.0000 mg/kg | Freq: Once | INTRAVENOUS | Status: AC
  Administered 2019-05-20: 200 mg via INTRAVENOUS
  Filled 2019-05-20: qty 10

## 2019-05-20 MED ORDER — SODIUM CHLORIDE 0.9% FLUSH
10.0000 mL | INTRAVENOUS | Status: DC | PRN
  Administered 2019-05-20: 10 mL
  Filled 2019-05-20: qty 10

## 2019-05-20 MED ORDER — ACETAMINOPHEN 325 MG PO TABS
650.0000 mg | ORAL_TABLET | Freq: Once | ORAL | Status: AC
  Administered 2019-05-20: 15:00:00 650 mg via ORAL

## 2019-05-20 MED ORDER — SODIUM CHLORIDE 0.9% FLUSH
10.0000 mL | INTRAVENOUS | Status: DC | PRN
  Administered 2019-05-20: 10 mL via INTRAVENOUS
  Filled 2019-05-20: qty 10

## 2019-05-20 MED ORDER — DIPHENHYDRAMINE HCL 25 MG PO CAPS
ORAL_CAPSULE | ORAL | Status: AC
  Filled 2019-05-20: qty 2

## 2019-05-20 MED ORDER — HEPARIN SOD (PORK) LOCK FLUSH 100 UNIT/ML IV SOLN
500.0000 [IU] | Freq: Once | INTRAVENOUS | Status: AC | PRN
  Administered 2019-05-20: 500 [IU]
  Filled 2019-05-20: qty 5

## 2019-05-20 NOTE — Patient Instructions (Signed)
[facility] Discharge Instructions for Patients Receiving Chemotherapy  Today you received the following chemotherapy agents Ado-trastuzumab (KADCYLA).  To help prevent nausea and vomiting after your treatment, we encourage you to take your nausea medication as prescribed.   If you develop nausea and vomiting that is not controlled by your nausea medication, call the clinic.   BELOW ARE SYMPTOMS THAT SHOULD BE REPORTED IMMEDIATELY:  *FEVER GREATER THAN 100.5 F  *CHILLS WITH OR WITHOUT FEVER  NAUSEA AND VOMITING THAT IS NOT CONTROLLED WITH YOUR NAUSEA MEDICATION  *UNUSUAL SHORTNESS OF BREATH  *UNUSUAL BRUISING OR BLEEDING  TENDERNESS IN MOUTH AND THROAT WITH OR WITHOUT PRESENCE OF ULCERS  *URINARY PROBLEMS  *BOWEL PROBLEMS  UNUSUAL RASH Items with * indicate a potential emergency and should be followed up as soon as possible.  Feel free to call the clinic should you have any questions or concerns. The clinic phone number is [phone].  Please show the CHEMO ALERT CARD at check-in to the Emergency Department and triage nurse.  Coronavirus (COVID-19) Are you at risk?  Are you at risk for the Coronavirus (COVID-19)?  To be considered HIGH RISK for Coronavirus (COVID-19), you have to meet the following criteria:  . Traveled to [place], [place], [place], [place] or [place]; or in the [place] to [place], [place], [place], or [place]; and have fever, cough, and shortness of breath within the last 2 weeks of travel OR . Been in close contact with a person diagnosed with COVID-19 within the last 2 weeks and have fever, cough, and shortness of breath . IF YOU DO NOT MEET THESE CRITERIA, YOU ARE CONSIDERED LOW RISK FOR COVID-19.  What to do if you are HIGH RISK for COVID-19?  . If you are having a medical emergency, call 911. . Seek medical care right away. Before you go to a doctor's office, urgent care or emergency department, call ahead and tell  them about your recent travel, contact with someone diagnosed with COVID-19, and your symptoms. You should receive instructions from your physician's office regarding next steps of care.  . When you arrive at healthcare provider, tell the healthcare staff immediately you have returned from visiting [place], [place], [place], [place] or [place]; or traveled in the [place] to [place], [place], [place], or [place]; in the last two weeks or you have been in close contact with a person diagnosed with COVID-19 in the last 2 weeks.   . Tell the health care staff about your symptoms: fever, cough and shortness of breath. . After you have been seen by a medical provider, you will be either: o Tested for (COVID-19) and discharged home on quarantine except to seek medical care if symptoms worsen, and asked to  - Stay home and avoid contact with others until you get your results (4-5 days)  - Avoid travel on public transportation if possible (such as bus, train, or airplane) or o Sent to the Emergency Department by EMS for evaluation, COVID-19 testing, and possible admission depending on your condition and test results.  What to do if you are LOW RISK for COVID-19?  Reduce your risk of any infection by using the same precautions used for avoiding the common cold or flu:  . Wash your hands often with soap and warm water for at least 20 seconds.  If soap and water are not readily available, use an alcohol-based hand sanitizer with at least 60% alcohol.  . If coughing or   sneezing, cover your mouth and nose by coughing or sneezing into the elbow areas of your shirt or coat, into a tissue or into your sleeve (not your hands). . Avoid shaking hands with others and consider head nods or verbal greetings only. . Avoid touching your eyes, nose, or mouth with unwashed hands.  . Avoid close contact with people who are sick. . Avoid places or events with large numbers of people in one location, like concerts or  sporting events. . Carefully consider travel plans you have or are making. . If you are planning any travel outside or inside the [place], visit the CDC's Travelers' Health webpage for the latest health notices. . If you have some symptoms but not all symptoms, continue to monitor at home and seek medical attention if your symptoms worsen. . If you are having a medical emergency, call 911.   [place] / e-Visit: [email or web address]         [facility]: [facility]: [phone]                   [facility]: [phone]

## 2019-05-20 NOTE — Assessment & Plan Note (Signed)
Metastatic breast cancer with innumerable liver metastases detected on ultrasound and recent MRI of the liver on 04/26/2016 (Prior history of left breast IDC T1 1 N0 stage IA 0.3 cm grade 1 tumor that was ER 38%, PR 93%, Ki-67 10%, HER-2 2+, no tissue for FISH testing, 0/1 lymph node negative, status post lumpectomy radiation and 5 years of Arimidex completed in February 2013)  PET/CT scan: 05/07/2016: Extensive metastatic involvement of both lobes of the liver, primary could be liver, phalangeal or metastatic disease, hypermetabolic thoracic lymph nodes left axillary, left supraclavicular and right CP angle lymph nodes, hypermetabolic upper abdominal lymph nodes  Liver biopsy10/07/2016: Metastatic carcinoma breast primary strong positivity for CK 7 weak focal positivity for ER, GCDFP, negative for PR, CDX 2, TTF-1, WT 1; HER-2 positive  Treatment plan: 1. Taxotere Herceptin and Perjeta every 3 weeks palliative chemotherapy started 06/13/16 (Perjeta discontinued after 5 cycles due to allergy) 2. followed by Herceptin maintenance stopped 05/08/2018 due to progression, Kadcyla started --------------------------------------------------------------------------------------------------------------------------------------- 10/14/2018: ERCP with stent placement  Current treatment: Kadcyla starting 10/25/2019todayiscycle16 Patient completed palliative radiation therapy to the brain Echo4/30/2020 shows EF of 60-65%  No clinical signs of metastatic breast cancer progression. Kadcyla toxicities: No side effects to Kadcyla.Will continue.  CT CAP: 02/22/2019:Slight interval decrease in the size of the liver metastases 5.4 cm to 5.1 cm, additional nodules stable Brain MRI 02/18/2019: New 4 mm metastases left posterior frontal vertex, favorable evaluation of treated cerebellar disease, status post SRS  Since she is responding very well up with systemic therapy, will continue Kadcyla every 3  weeks. I will see her every 6 weeks for follow-ups. Next scan will be in January 2021.

## 2019-05-21 ENCOUNTER — Other Ambulatory Visit: Payer: Self-pay | Admitting: Radiation Therapy

## 2019-05-21 DIAGNOSIS — C7931 Secondary malignant neoplasm of brain: Secondary | ICD-10-CM

## 2019-05-21 DIAGNOSIS — C7949 Secondary malignant neoplasm of other parts of nervous system: Secondary | ICD-10-CM

## 2019-05-27 ENCOUNTER — Other Ambulatory Visit: Payer: Self-pay | Admitting: Radiation Therapy

## 2019-06-10 ENCOUNTER — Other Ambulatory Visit: Payer: Self-pay

## 2019-06-10 ENCOUNTER — Inpatient Hospital Stay: Payer: Medicare Other | Attending: Hematology and Oncology

## 2019-06-10 ENCOUNTER — Inpatient Hospital Stay: Payer: Medicare Other

## 2019-06-10 VITALS — BP 144/75 | HR 91 | Temp 98.5°F | Resp 18 | Ht 65.0 in | Wt 148.5 lb

## 2019-06-10 DIAGNOSIS — C787 Secondary malignant neoplasm of liver and intrahepatic bile duct: Secondary | ICD-10-CM | POA: Diagnosis not present

## 2019-06-10 DIAGNOSIS — Z923 Personal history of irradiation: Secondary | ICD-10-CM | POA: Diagnosis not present

## 2019-06-10 DIAGNOSIS — C50919 Malignant neoplasm of unspecified site of unspecified female breast: Secondary | ICD-10-CM

## 2019-06-10 DIAGNOSIS — R609 Edema, unspecified: Secondary | ICD-10-CM | POA: Insufficient documentation

## 2019-06-10 DIAGNOSIS — Z79899 Other long term (current) drug therapy: Secondary | ICD-10-CM | POA: Insufficient documentation

## 2019-06-10 DIAGNOSIS — C7931 Secondary malignant neoplasm of brain: Secondary | ICD-10-CM | POA: Diagnosis not present

## 2019-06-10 DIAGNOSIS — Z5112 Encounter for antineoplastic immunotherapy: Secondary | ICD-10-CM | POA: Insufficient documentation

## 2019-06-10 DIAGNOSIS — Z95828 Presence of other vascular implants and grafts: Secondary | ICD-10-CM

## 2019-06-10 DIAGNOSIS — Z170 Estrogen receptor positive status [ER+]: Secondary | ICD-10-CM | POA: Diagnosis not present

## 2019-06-10 DIAGNOSIS — Z9221 Personal history of antineoplastic chemotherapy: Secondary | ICD-10-CM | POA: Diagnosis not present

## 2019-06-10 LAB — CMP (CANCER CENTER ONLY)
ALT: 12 U/L (ref 0–44)
AST: 33 U/L (ref 15–41)
Albumin: 2.9 g/dL — ABNORMAL LOW (ref 3.5–5.0)
Alkaline Phosphatase: 81 U/L (ref 38–126)
Anion gap: 10 (ref 5–15)
BUN: 14 mg/dL (ref 8–23)
CO2: 22 mmol/L (ref 22–32)
Calcium: 8.4 mg/dL — ABNORMAL LOW (ref 8.9–10.3)
Chloride: 106 mmol/L (ref 98–111)
Creatinine: 0.71 mg/dL (ref 0.44–1.00)
GFR, Est AFR Am: >60 mL/min (ref >60)
GFR, Estimated: >60 mL/min (ref >60)
Glucose, Bld: 161 mg/dL — ABNORMAL HIGH (ref 70–99)
Potassium: 3.6 mmol/L (ref 3.5–5.1)
Sodium: 138 mmol/L (ref 135–145)
Total Bilirubin: 1.1 mg/dL (ref 0.3–1.2)
Total Protein: 6.4 g/dL — ABNORMAL LOW (ref 6.5–8.1)

## 2019-06-10 LAB — CBC WITH DIFFERENTIAL (CANCER CENTER ONLY)
Abs Immature Granulocytes: 0.01 10*3/uL (ref 0.00–0.07)
Basophils Absolute: 0.1 10*3/uL (ref 0.0–0.1)
Basophils Relative: 1 %
Eosinophils Absolute: 0.1 10*3/uL (ref 0.0–0.5)
Eosinophils Relative: 2 %
HCT: 35.7 % — ABNORMAL LOW (ref 36.0–46.0)
Hemoglobin: 12.1 g/dL (ref 12.0–15.0)
Immature Granulocytes: 0 %
Lymphocytes Relative: 14 %
Lymphs Abs: 0.7 10*3/uL (ref 0.7–4.0)
MCH: 30.6 pg (ref 26.0–34.0)
MCHC: 33.9 g/dL (ref 30.0–36.0)
MCV: 90.4 fL (ref 80.0–100.0)
Monocytes Absolute: 0.5 10*3/uL (ref 0.1–1.0)
Monocytes Relative: 11 %
Neutro Abs: 3.3 10*3/uL (ref 1.7–7.7)
Neutrophils Relative %: 72 %
Platelet Count: 115 10*3/uL — ABNORMAL LOW (ref 150–400)
RBC: 3.95 MIL/uL (ref 3.87–5.11)
RDW: 15.8 % — ABNORMAL HIGH (ref 11.5–15.5)
WBC Count: 4.6 10*3/uL (ref 4.0–10.5)
nRBC: 0 % (ref 0.0–0.2)

## 2019-06-10 MED ORDER — DIPHENHYDRAMINE HCL 25 MG PO CAPS
50.0000 mg | ORAL_CAPSULE | Freq: Once | ORAL | Status: AC
  Administered 2019-06-10: 50 mg via ORAL

## 2019-06-10 MED ORDER — HEPARIN SOD (PORK) LOCK FLUSH 100 UNIT/ML IV SOLN
500.0000 [IU] | Freq: Once | INTRAVENOUS | Status: AC | PRN
  Administered 2019-06-10: 500 [IU]
  Filled 2019-06-10: qty 5

## 2019-06-10 MED ORDER — ACETAMINOPHEN 325 MG PO TABS
ORAL_TABLET | ORAL | Status: AC
  Filled 2019-06-10: qty 2

## 2019-06-10 MED ORDER — SODIUM CHLORIDE 0.9% FLUSH
10.0000 mL | INTRAVENOUS | Status: DC | PRN
  Administered 2019-06-10: 10 mL
  Filled 2019-06-10: qty 10

## 2019-06-10 MED ORDER — DIPHENHYDRAMINE HCL 25 MG PO CAPS
ORAL_CAPSULE | ORAL | Status: AC
  Filled 2019-06-10: qty 2

## 2019-06-10 MED ORDER — ACETAMINOPHEN 325 MG PO TABS
650.0000 mg | ORAL_TABLET | Freq: Once | ORAL | Status: AC
  Administered 2019-06-10: 650 mg via ORAL

## 2019-06-10 MED ORDER — SODIUM CHLORIDE 0.9% FLUSH
10.0000 mL | INTRAVENOUS | Status: DC | PRN
  Administered 2019-06-10: 10 mL via INTRAVENOUS
  Filled 2019-06-10: qty 10

## 2019-06-10 MED ORDER — SODIUM CHLORIDE 0.9 % IV SOLN
Freq: Once | INTRAVENOUS | Status: AC
  Administered 2019-06-10: 14:00:00 via INTRAVENOUS
  Filled 2019-06-10: qty 250

## 2019-06-10 MED ORDER — SODIUM CHLORIDE 0.9 % IV SOLN
3.0000 mg/kg | Freq: Once | INTRAVENOUS | Status: AC
  Administered 2019-06-10: 200 mg via INTRAVENOUS
  Filled 2019-06-10: qty 10

## 2019-06-10 NOTE — Patient Instructions (Signed)

## 2019-06-10 NOTE — Patient Instructions (Signed)
[facility] Discharge Instructions for Patients Receiving Chemotherapy  Today you received the following chemotherapy agents Kadcyla  To help prevent nausea and vomiting after your treatment, we encourage you to take your nausea medication as directed   If you develop nausea and vomiting that is not controlled by your nausea medication, call the clinic.   BELOW ARE SYMPTOMS THAT SHOULD BE REPORTED IMMEDIATELY:  *FEVER GREATER THAN 100.5 F  *CHILLS WITH OR WITHOUT FEVER  NAUSEA AND VOMITING THAT IS NOT CONTROLLED WITH YOUR NAUSEA MEDICATION  *UNUSUAL SHORTNESS OF BREATH  *UNUSUAL BRUISING OR BLEEDING  TENDERNESS IN MOUTH AND THROAT WITH OR WITHOUT PRESENCE OF ULCERS  *URINARY PROBLEMS  *BOWEL PROBLEMS  UNUSUAL RASH Items with * indicate a potential emergency and should be followed up as soon as possible.  Feel free to call the clinic should you have any questions or concerns. The clinic phone number is [phone].  Please show the CHEMO ALERT CARD at check-in to the Emergency Department and triage nurse.   

## 2019-06-16 IMAGING — CT CT ABD-PELV W/ CM
1 of 3 series · 13 of 32 positions shown, 18 images · IV contrast (APPLIED)
Comparison: 01/08/2018 CT abdomen/pelvis.

CLINICAL DATA: Metastatic breast cancer on chemotherapy. Nausea.
Abdominal pain.

EXAM:
CT ABDOMEN AND PELVIS WITH CONTRAST
TECHNIQUE: Multidetector CT imaging of the abdomen and pelvis was performed
using the standard protocol following bolus administration of
intravenous contrast.
CONTRAST:  100mL [id number] IOPAMIDOL (VYRR6Y-KZZ) INJECTION 61%

[Series 2: abd/pelvis w/cm · axial · 0.80mm/px · z∈[-454,-64]mm · 13 of 91 slices shown, 18 images]
[im 7/91  soft-tissue]
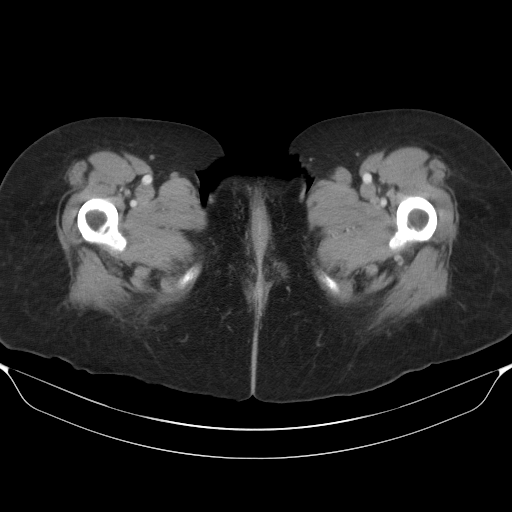
[im 7/91  bone]
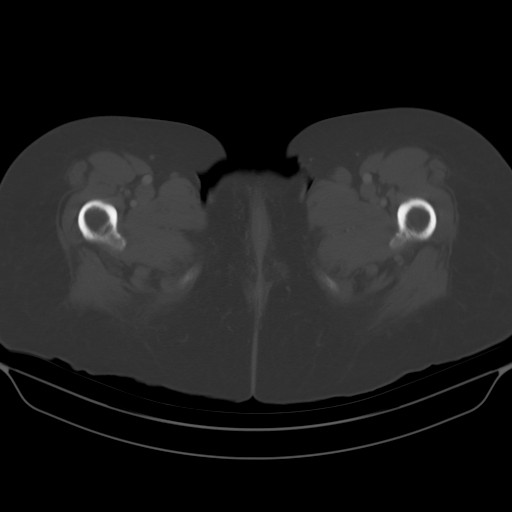
[im 13/91  soft-tissue]
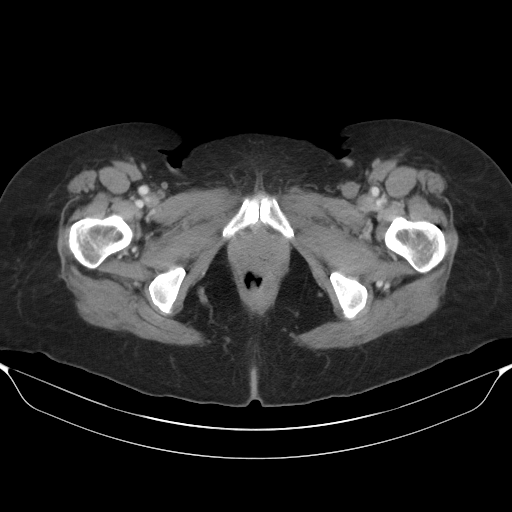
[im 19/91  soft-tissue]
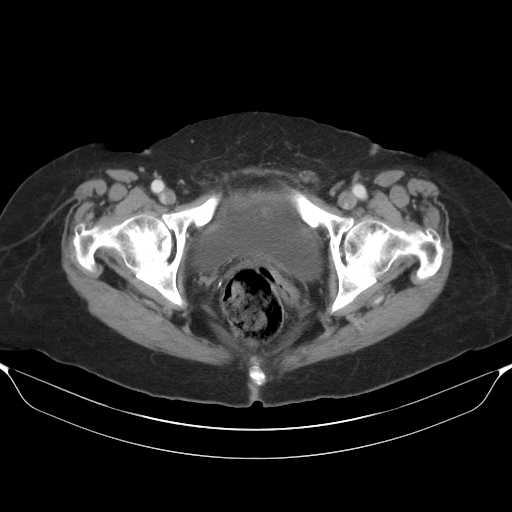
[im 31/91  soft-tissue]
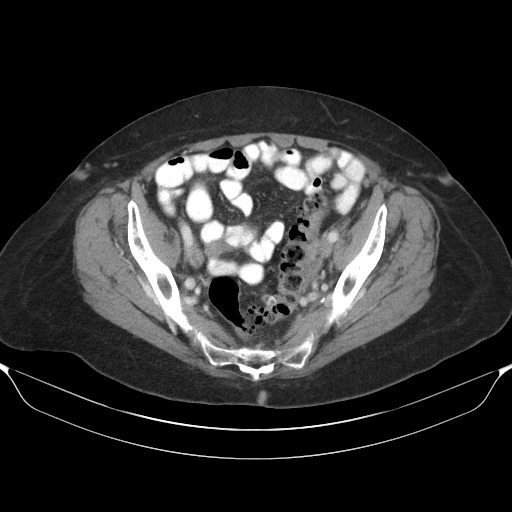
[im 37/91  soft-tissue]
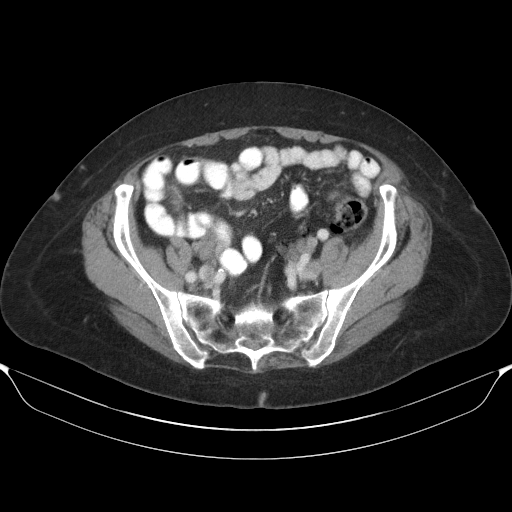
[im 43/91  soft-tissue]
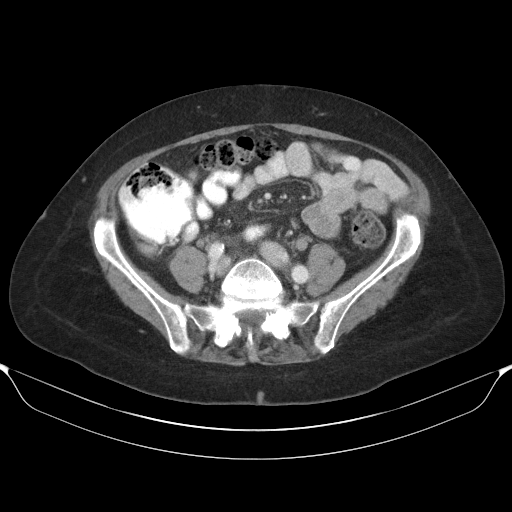
[im 49/91  soft-tissue]
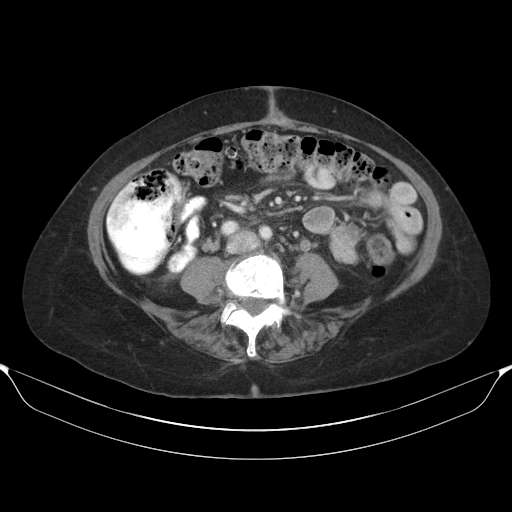
[im 55/91  soft-tissue]
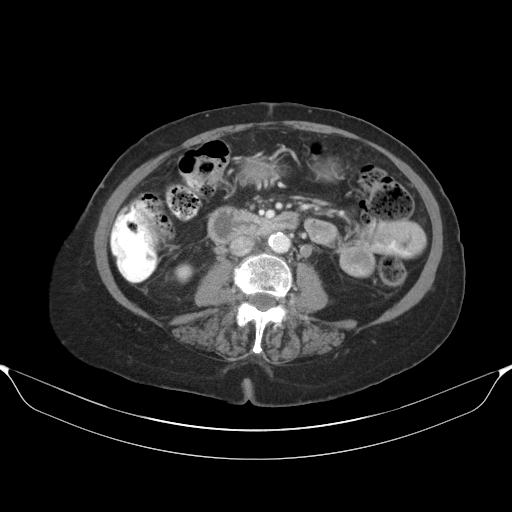
[im 61/91  soft-tissue]
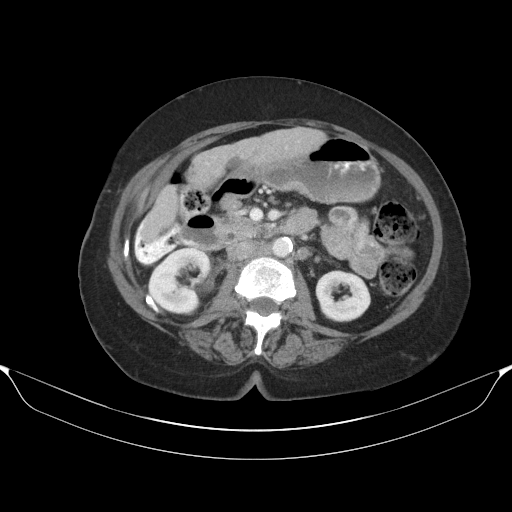
[im 61/91  bone]
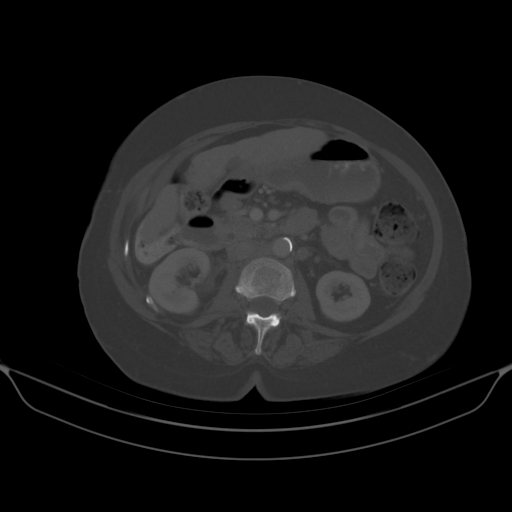
[im 67/91  lung]
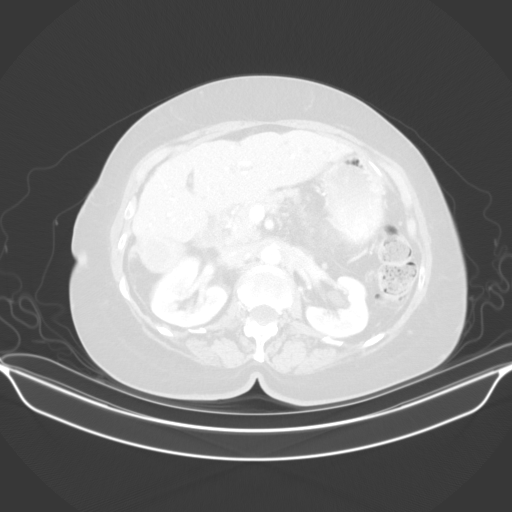
[im 73/91  soft-tissue]
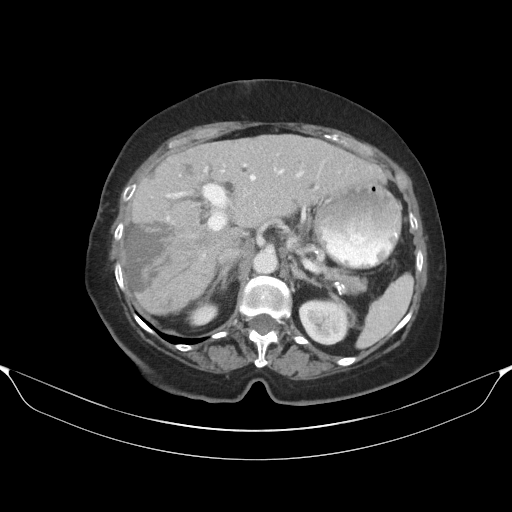
[im 73/91  lung]
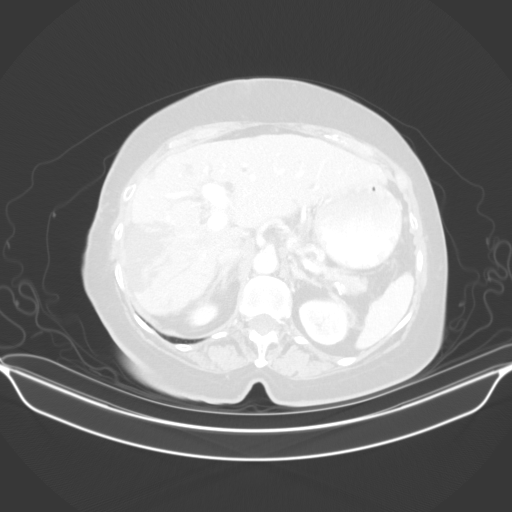
[im 79/91  soft-tissue]
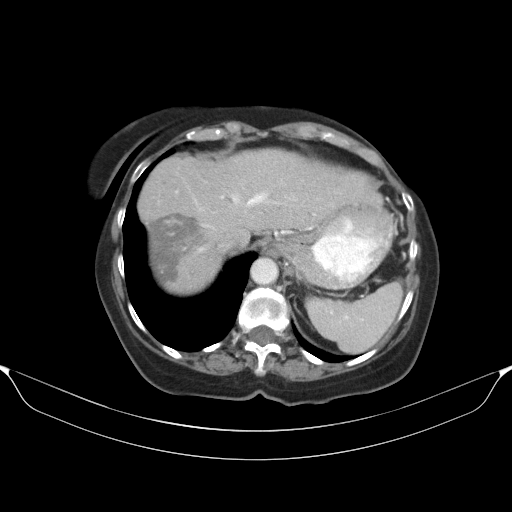
[im 79/91  lung]
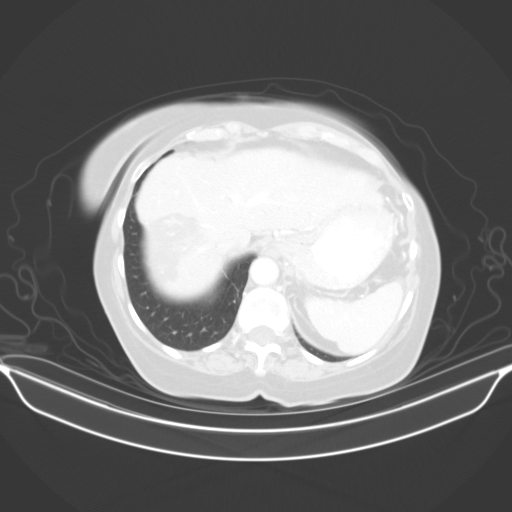
[im 85/91  soft-tissue]
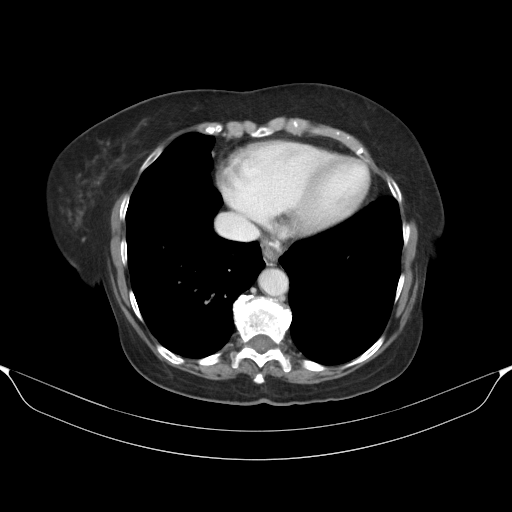
[im 85/91  lung]
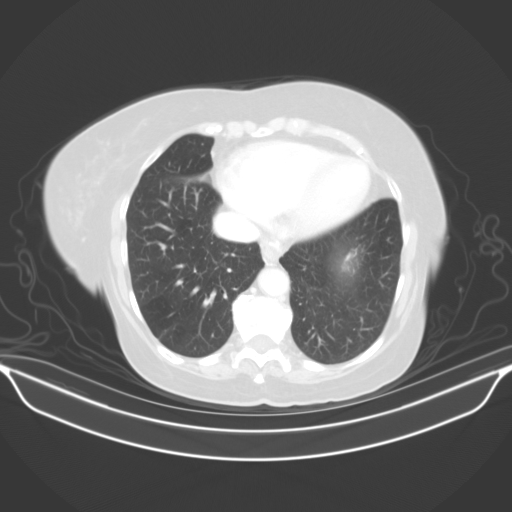

[13 of 32 positions shown; findings below may reference images not displayed]

FINDINGS: Lower chest: No significant pulmonary nodules or acute consolidative
airspace disease. Right coronary atherosclerosis.

Hepatobiliary: There is a heterogeneously enhancing 3.2 x 3.1 cm
segment 6 right liver lobe mass (series 2/image 24), increased from
2.5 x 2.2 cm on 01/08/2018 CT. Heterogeneous hypoenhancing 6.4 x
cm focus in the segment 7 right liver lobe (series 2/image 16),
previously 6.3 x 5.4 cm, not appreciably changed. Innumerable
additional small hypodense foci scattered throughout the liver
measuring up to 1.2 cm in the inferior left liver lobe (series
2/image 31) are unchanged. No new liver lesions. Stable nodular
liver contour compatible with pseudo cirrhosis. Cholecystectomy.
Mild central intrahepatic biliary ductal dilatation is mildly
increased. Normal caliber CBD (5 mm diameter).

Pancreas: Normal, with no mass or duct dilation.

Spleen: Normal size. No mass.

Adrenals/Urinary Tract: Normal adrenals. Scattered subcentimeter
hypodense right renal cortical lesions are too small to characterize
and unchanged, considered benign. No new renal lesions. No
hydronephrosis. Normal bladder.

Stomach/Bowel: Small hiatal hernia. Otherwise normal nondistended
stomach. Normal caliber small bowel with no small bowel wall
thickening. Appendectomy. Marked left colonic diverticulosis, most
prominent in the sigmoid colon, with no large bowel wall thickening
or acute pericolonic fat stranding.

Vascular/Lymphatic: Atherosclerotic nonaneurysmal abdominal aorta.
Patent main portal, splenic, hepatic and renal veins. Chronic
occlusion of the right portal vein. Stable mildly enlarged 1.0 cm
left para-aortic node (series 2/image 27). Stable mildly enlarged
1.1 cm aortocaval node (series 2/image 30). No new pathologically
enlarged lymph nodes in the abdomen or pelvis.

Reproductive: No adnexal mass. Stable coarsely calcified small
uterine fibroids.

Other: No pneumoperitoneum, ascites or focal fluid collection.

Musculoskeletal: No aggressive appearing focal osseous lesions.
Marked lower lumbar degenerative disc disease.
IMPRESSION: 1. Interval growth of segment 6 right liver lobe metastasis.
Additional liver lesions are stable. Stable pseudo cirrhotic
appearance of the liver.
2. Stable chronic occlusion of the right portal vein.
3. Stable retroperitoneal adenopathy.
4. Mild central intrahepatic biliary ductal dilatation is mildly
increased. Normal caliber CBD. Developing malignant central biliary
stricture is not excluded. Recommend correlation with serum
bilirubin levels and continued attention on follow-up imaging.
5.  Aortic Atherosclerosis ([id number]).

## 2019-06-17 ENCOUNTER — Ambulatory Visit (HOSPITAL_COMMUNITY)
Admission: RE | Admit: 2019-06-17 | Discharge: 2019-06-17 | Disposition: A | Discharge status: Home / Self Care | Payer: Medicare Other | Source: Ambulatory Visit | Attending: Radiation Oncology | Admitting: Radiation Oncology

## 2019-06-17 ENCOUNTER — Other Ambulatory Visit: Payer: Self-pay

## 2019-06-17 DIAGNOSIS — C7949 Secondary malignant neoplasm of other parts of nervous system: Secondary | ICD-10-CM | POA: Insufficient documentation

## 2019-06-17 DIAGNOSIS — C801 Malignant (primary) neoplasm, unspecified: Secondary | ICD-10-CM | POA: Diagnosis not present

## 2019-06-17 DIAGNOSIS — C7931 Secondary malignant neoplasm of brain: Secondary | ICD-10-CM | POA: Insufficient documentation

## 2019-06-17 MED ORDER — SODIUM CHLORIDE 0.9% FLUSH: 10.0000 mL | INTRAVENOUS | Status: DC | PRN

## 2019-06-17 MED ORDER — HEPARIN SOD (PORK) LOCK FLUSH 100 UNIT/ML IV SOLN
500.0000 [IU] | INTRAVENOUS | Status: AC | PRN
  Administered 2019-06-17: 500 [IU]
  Filled 2019-06-17: qty 5

## 2019-06-17 MED ORDER — GADOBUTROL 1 MMOL/ML IV SOLN
6.0000 mL | Freq: Once | INTRAVENOUS | Status: AC | PRN
  Administered 2019-06-17: 6 mL via INTRAVENOUS

## 2019-06-18 ENCOUNTER — Telehealth: Payer: Self-pay

## 2019-06-18 NOTE — Telephone Encounter (Signed)
-----   Message from Irving Copas., MD sent at 06/18/2019  2:59 AM EST ----- Regarding: Follow-up Monique Figueroa,Not sure there is a result of Covid, but the patient's stent should be exchanged in the next few months as it has been over 6 months since our last ERCP and plastic stent placement.  Please reach out to the patient and get her on the waiting list for list for ERCP with stent exchange.Once we know when her procedure is please let me know because I will potentially trying to order some permanent stents for her procedure.Thanks.GM

## 2019-06-21 ENCOUNTER — Inpatient Hospital Stay: Payer: Medicare Other

## 2019-06-21 ENCOUNTER — Other Ambulatory Visit: Payer: Self-pay

## 2019-06-21 DIAGNOSIS — K831 Obstruction of bile duct: Secondary | ICD-10-CM

## 2019-06-21 DIAGNOSIS — K838 Other specified diseases of biliary tract: Secondary | ICD-10-CM

## 2019-06-21 NOTE — Telephone Encounter (Signed)
Left message on machine to call back  

## 2019-06-21 NOTE — Telephone Encounter (Signed)
ERCP scheduled for 07/28/19 at 830 am [facility] with Dr Rush Landmark COVID testing on 12/19.  Information mailed.  Left message on machine to call back

## 2019-06-21 NOTE — Telephone Encounter (Signed)
Pt needs ERCP stent exchange possibly on 07/28/19 at 830 am with Dr Rush Landmark

## 2019-06-23 ENCOUNTER — Ambulatory Visit
Admission: RE | Admit: 2019-06-23 | Discharge: 2019-06-23 | Disposition: A | Discharge status: Home / Self Care | Payer: Medicare Other | Source: Ambulatory Visit | Attending: Urology | Admitting: Urology

## 2019-06-23 ENCOUNTER — Encounter: Payer: Self-pay | Admitting: Urology

## 2019-06-23 ENCOUNTER — Other Ambulatory Visit: Payer: Self-pay

## 2019-06-23 DIAGNOSIS — Z923 Personal history of irradiation: Secondary | ICD-10-CM | POA: Diagnosis not present

## 2019-06-23 DIAGNOSIS — Z853 Personal history of malignant neoplasm of breast: Secondary | ICD-10-CM | POA: Diagnosis not present

## 2019-06-23 DIAGNOSIS — C7931 Secondary malignant neoplasm of brain: Secondary | ICD-10-CM | POA: Diagnosis not present

## 2019-06-23 NOTE — Progress Notes (Addendum)
Radiation Oncology         [phone] ________________________________  Name: CALY PELLUM MRN: [id number]  Date: 06/23/2019  DOB: July 11, 1934  Follow up Visit Note  CC: [name], MD  Nicholas Lose, MD  Diagnosis:   85 yo woman with a new 83m brain metastasis at the left posterior frontal vertex from Stage IV metastatic breast cancer     Interval Since Last Radiation:  3.5 months  03/02/19: SRS// The Leftfrontaltarget was treatedto a prescription dose of 20Gy in a single fraction.  05/11/2018 - 05/22/2018: The posterior fossa was treated to 30 Gy in 10 fractions  07/30/06 - 09/17/06: adjuvant breast radiotherapy- Dr. WElba Barman Narrative:  I spoke with the patient to conduct her routine scheduled 3 month follow-up visit via telephone to spare the patient unnecessary potential exposure in the healthcare setting during the current COVID-19 pandemic.  The patient was notified in advance and gave permission to proceed with this visit format.  In summary, she was initially diagnosed in 06/2006 with Stage I, [id number] invasive ductal carcinoma, grade 1, ER/PR positive, Her-2 positive with extensive high-grade DCIS of the left breast. She underwent lumpectomy 06/19/06 followed by adjuvant radiotherapy from 07/30/06 - 09/17/06 with Dr. WElba Barman  She completed 5 years of antiestrogen therapy with Arimidex from 09/2006 - 09/2011.  Unfortunately, she developed extensive liver metastases in 04/2016 with innumerable hepatic lesions throughout both lobes.  She was treated with systemic chemotherapy with Taxotere, Herceptin and Perjeta from 06/2016 -  11/2016 followed by Herceptin maintenance therapy which she started on 11/28/2016 and has continued to tolerate well.  Follow-up scans showed evidence of disease progression and she developed symptoms of chronic cholecystitis due to extensive gallbladder involvement. She proceeded to diagnostic laparoscopy with Dr. IDalbert Batman for possible cholecystectomy but at the time of  the procedure, the gallbladder was rocksolid and not distensible with tumor palpated in the porta hepatis, as well as extensive lucent liver lesions.  Therefore, the decision was made to abort the procedure.  She then subsequently developed obstructive jaundice and underwent ERCP for stent placement. This led to marked improvement in her bilirubin levels.   She experienced nausea, dry heaves, and dizziness for several months which she was attributing to her gallbladder and liver metastases. However, on 05/06/18, she presented to her medical oncologist with complaints of headaches spreading from her neck to behind her left eye, blurry vision in her left eye, unsteadiness of gait, fullness in the right ear and decline in short term memory present for 2 weeks. A brain MRI performed on 05/07/18 revealed 5 cerebellar metastases narrowing the fourth ventricle and obex, at risk for obstructive hydrocephalus.  This was treated with palliative radiotherapy to the posterior fossa, completed 05/22/18 and tolerated very well.  She was placed on targeted therapy with KElvera Maria which was started on 05/29/2018 under the care and direction of Dr. GLindi Adie  Her initial posttreatment MRI on August 28, 2018 showed an excellent response to treatment with only 3 of the original 5 cerebellar metastases remaining visible, and 1 of those was only punctate.  The other 2 remaining lesions had decreased in size and cerebellar edema and posterior fossa mass-effect were resolved.  Unfortunately, a follow up MRI brain on 02/18/19 showed  a new 451mbrain metastasis at the left posterior frontal vertex with favorable evolution of the previously treated cerebellar disease, with only mild smudgy enhancement remaining visible at the site of the largest treated lesion at the right posterior cerebellum. Other foci  were no longer enhancing. She continued on targeted therapy with Kadcyla under the care and direction of Dr. [name]. She has completed  16 cycles and continues to tolerate this well.  She has a scheduled follow up with Dr. [name] on 06/30/19 with plans to repeat systemic imaging for disease restaging in 08/2019.  Her most recent systemic imaging from 02/2019 showed overall disease stability with a slight decrease in size of a right hepatic lesion and no evidence of new, distant metastatic disease in the chest, abdomen or pelvis.              On review of systems, the patient states that she is doing very well overall.  She is accompanied by her son, [name], on the telephone today, who agrees that she has had continued improvement in her energy, coordination and gait since completion of her prior radiotherapy but continues with mild short term memory loss.  Today, she is without complaints.  She has residual blurry vision in the left eye, unchanged since the time of her diagnosis.  She specifically denies headaches, N/V, new changes in her visual or auditory acuity, dizziness, imbalance, tremor or seizure activity. She has a 3 month old great-grandson that she is enjoying seeing periodically on ZOOM calls since they live in [place], [place] and the COVID-19 has put a damper on their ability to travel for visits. He brings her great joy!!!  ALLERGIES:  is allergic to perjeta [pertuzumab] and sulfa antibiotics.  Meds: Current Outpatient Medications  Medication Sig Dispense Refill   calcium carbonate (TUMS - DOSED IN MG ELEMENTAL CALCIUM) 500 MG chewable tablet Chew 1 tablet by mouth 2 (two) times daily as needed for indigestion or heartburn.      hydrochlorothiazide (HYDRODIURIL) 25 MG tablet Take 25 mg by mouth daily.     levothyroxine (SYNTHROID) 125 MCG tablet Take 1 tablet (125 mcg total) by mouth daily before breakfast. 30 tablet 0   lidocaine-prilocaine (EMLA) cream Apply 1 application topically as needed. 30 g 0   Liniments (BLUE-EMU SUPER STRENGTH EX) Apply 1 application topically daily as needed (muscle pain).     metFORMIN  (GLUCOPHAGE) 500 MG tablet Take 500 mg by mouth daily with breakfast.      omeprazole (PRILOSEC OTC) 20 MG tablet Take 20 mg by mouth daily as needed (acid reflux).      ondansetron (ZOFRAN-ODT) 8 MG disintegrating tablet TAKE 1 TABLET (8 MG TOTAL) BY MOUTH EVERY 8 (EIGHT) HOURS AS NEEDED FOR NAUSEA OR VOMITING. 20 tablet 1   ONDANSETRON HCL PO      Phenazopyridine HCl (AZO-STANDARD PO) Take 1 tablet by mouth 2 (two) times daily as needed (uti symptoms).     potassium chloride (MICRO-K) 10 MEQ CR capsule TAKE 1 CAPSULE (10 MEQ TOTAL) BY MOUTH 2 (TWO) TIMES DAILY. 60 capsule 0   prochlorperazine (COMPAZINE) 10 MG tablet Take 1 tablet (10 mg total) by mouth every 6 (six) hours as needed for nausea or vomiting. 30 tablet 6   Propylene Glycol (SYSTANE COMPLETE) 0.6 % SOLN Place 1 drop into both eyes 2 (two) times daily.     sodium chloride (OCEAN) 0.65 % SOLN nasal spray Place 1 spray into both nostrils as needed for congestion.     acetaminophen (TYLENOL) 500 MG tablet Take 500 mg by mouth 2 (two) times daily as needed for moderate pain or headache.     benzocaine (ORAJEL) 10 % mucosal gel Use as directed 1 application in the mouth or throat  as needed for mouth pain.     ciprofloxacin (CIPRO) 500 MG tablet      diphenhydrAMINE (BENADRYL) 25 MG tablet Take 25 mg by mouth daily as needed for allergies.     oxyCODONE-acetaminophen (PERCOCET/ROXICET) 5-325 MG tablet Take 1 tablet by mouth daily as needed for severe pain.     No current facility-administered medications for this encounter.     Physical Findings:  vitals were not taken for this visit.   /Unable to assess due to telephone follow-up visit format.  Lab Findings: Lab Results  Component Value Date   WBC 4.6 06/10/2019   HGB 12.1 06/10/2019   HCT 35.7 (L) 06/10/2019   MCV 90.4 06/10/2019   PLT 115 (L) 06/10/2019     Radiographic Findings: Mr Jeri Cos DJ Contrast  Result Date: 06/17/2019 CLINICAL DATA:  Follow-up  treated metastatic disease. EXAM: MRI HEAD WITHOUT AND WITH CONTRAST TECHNIQUE: Multiplanar, multiecho pulse sequences of the brain and surrounding structures were obtained without and with intravenous contrast. CONTRAST:  77m GADAVIST GADOBUTROL 1 MMOL/ML IV SOLN COMPARISON:  02/18/2019.  08/28/2018.  05/07/2018. FINDINGS: BRAIN New Lesions: None. Larger lesions: None. Stable or Smaller lesions: 4 mm left frontal lesion newly seen on the last exam is smaller, only 2 mm today. Axial image 160. Linear focus of contrast enhancement along the posterior surface of a left frontal gyrus has been present on the last 2 studies and is nonprogressive. This is unlikely to represent viable disease. Axial image 140. Treated cerebellar disease is stable. Nonenhancing 11 mm nodule in the right side of the inferior fourth ventricle. Smudgy residual enhancement of a treated lesion in the posterior right cerebellum. Other small foci of treated cerebellar disease show T2 signal but no enhancement. Other Brain findings: Chronic small-vessel ischemic changes throughout the white matter are stable. No evidence of recent infarction. No hemorrhage, hydrocephalus or extra-axial collection. Vascular: Major vessels at the base of the brain show flow. Skull and upper cervical spine: Negative Sinuses/Orbits: Clear/normal. Small amount of mastoid fluid on the right. Other: None IMPRESSION: No new or progressive disease. Reduction in size of a recently treated left posterior frontal lesion axial image 160, reduced from 4 mm to 2 mm. Treated cerebellar disease is stable. Linear enhancement along the posterior surface of a left frontal gyrus axial image 140 is stable over the last 2 studies. Unlikely to represent viable disease. * Electronically Signed   By: MNelson ChimesM.D.   On: 06/17/2019 21:05    Impression/Plan: [age]yo female with brain metastases from Stage IV metastatic breast cancer.    She appears to have recovered well from the  effects of her recent brain radiation and is currently without complaints.  We will continue with routine, serial MRI brain scans every 3 months to continue to monitor for any evidence of disease progression or recurrence.  She will also continue in routine follow-up under the care and direction of Dr. GLindi Adiefor continued management of her systemic disease.  The current plan is to continue on Kadcycla with repeat systemic imaging in January 2021.  We will follow up after her brain MRI in February 2021 but she knows to call [place] anytime in the interim with any concerns or questions regarding her previous treatment or any new symptoms.  Given current concerns for patient exposure during the COVID-19 pandemic, this encounter was conducted via telephone. The patient was notified in advance and was offered a [place] to allow for face to  face communication but unfortunately reported that she did not have the appropriate resources/technology to support such a visit and instead preferred to proceed with telephone follow up visit. The patient has given verbal consent for this type of encounter. The time spent during this encounter was 20 minutes. The attendants for this meeting include Samanda Buske PA-C, patient, Virtie Bungert and son, [name]. During the encounter, Teena Mangus PA-C was located at [facility] Radiation Oncology Department.  Patient, Marshella Tello and son, [name], were located at home.    Nicholos Johns, PA-C

## 2019-06-28 NOTE — Telephone Encounter (Signed)
Left message on machine to call back  

## 2019-06-28 NOTE — Telephone Encounter (Signed)
appt changed to 10 am on 12/23

## 2019-06-29 ENCOUNTER — Telehealth: Payer: Self-pay | Admitting: Gastroenterology

## 2019-06-29 NOTE — Telephone Encounter (Signed)
Thank you for update. We will see her then. GM

## 2019-06-29 NOTE — Telephone Encounter (Signed)
I spoke with the pt's son and we discussed the ERCP and COVID testing.  He will review on My Chart and call with any questions.

## 2019-06-29 NOTE — Progress Notes (Signed)
Patient Care Team: [name], MD as PCP - General (Internal Medicine)  DIAGNOSIS:    ICD-10-CM   1. Metastases to the liver (HCC)  C78.7   2. Metastatic breast cancer ([place])  C50.919     SUMMARY OF ONCOLOGIC HISTORY: Oncology History  Metastatic breast cancer ([place])  06/05/2006 Initial Diagnosis   Left breast biopsy: DCIS with apocrine features ER 30%, PR 13%   06/19/2006 Surgery   Left lumpectomy: stage I, pT1a, pN0(i)(sn), pMX, 0.3 cm IDC, grade 1, with extensive high-grade DCIS , margins negative ER 38 %, PR 93 %, Ki-67 10%, HER-2/neu 2+, no additional tissue available for FISH testing, with 0/1 left axillary lymph nodes.   07/30/2006 - 09/17/2006 Radiation Therapy   Adjuvant radiation therapy   09/24/2006 - 09/25/2011 Anti-estrogen oral therapy   Antiestrogen therapy with Arimidex 5 years   04/26/2016 Relapse/Recurrence   Innumerable hepatic lesions seen in both lobes most are 1-2 cm size somewhat confluent largest in the posterior right hepatic dome 3 x 4.6 cm   05/07/2016 PET scan   PET/CT scan: Extensive metastatic involvement of both lobes of the liver, primary could be liver, phalangeal or metastatic disease, hypermetabolic thoracic LN left axillary, left supraclavicular and right CP angle LN, hypermetabolic upper abdominal LN   05/16/2016 Initial Biopsy   Liver biopsy: Metastatic carcinoma breast primary strong positivity for CK 7 weak focal positivity for ER, GCDFP, negative for PR, CDX 2, TTF-1, WT 1; HER-2 positive   06/13/2016 - 11/07/2016 Chemotherapy   Taxotere Herceptin and Perjeta every 3 weeks (Taxotere was held for elevated LFTs for cycles 1 and 2)   11/28/2016 - 05/06/2018 Chemotherapy   Herceptin maintenance therapy for metastatic breast cancer every 3 weeks   03/24/2018 Surgery   Chronic cholecystitis: Diagnostic laparoscopy: Due to intermittent attacks of biliary colic: The gallbladder was rockhard and not distensible with tumor palpated in the porta hepatis,  extensive lucent liver lesions, decision was made to stop the surgery.   05/12/2018 - 05/22/2018 Radiation Therapy   Radiation to the brain   05/29/2018 -  Chemotherapy   Kadcyla     CHIEF COMPLIANT: Follow-up of metastatic breast cancer on Kadcyla  INTERVAL HISTORY: Monique Figueroa is a 85 y.o. with above-mentioned history of metastatic breast cancer with brain metastasis currently on Kadcyla.Brain MRI on 06/17/19 showed no new or progressive disease, reduction in the left posterior frontal lesion, and stable treated cerebellar disease. She presents to the clinic todayfor treatment.  She is complaining of leg swelling over the past week.  REVIEW OF SYSTEMS:   Constitutional: Denies fevers, chills or abnormal weight loss Eyes: Denies blurriness of vision Ears, nose, mouth, throat, and face: Denies mucositis or sore throat Respiratory: Denies cough, dyspnea or wheezes Cardiovascular: Denies palpitation, chest discomfort Gastrointestinal: Denies nausea, heartburn or change in bowel habits Skin: Denies abnormal skin rashes Lymphatics: Denies new lymphadenopathy or easy bruising Neurological: Denies numbness, tingling or new weaknesses Behavioral/Psych: Mood is stable, no new changes  Extremities: Bilateral lower extremity edema Breast: denies any pain or lumps or nodules in either breasts All other systems were reviewed with the patient and are negative.  I have reviewed the past medical history, past surgical history, social history and family history with the patient and they are unchanged from previous note.  ALLERGIES:  is allergic to perjeta [pertuzumab] and sulfa antibiotics.  MEDICATIONS:  Current Outpatient Medications  Medication Sig Dispense Refill  . acetaminophen (TYLENOL) 500 MG tablet Take 500 mg by  mouth 2 (two) times daily as needed for moderate pain or headache.    . benzocaine (ORAJEL) 10 % mucosal gel Use as directed 1 application in the mouth or throat as  needed for mouth pain.    . calcium carbonate (TUMS - DOSED IN MG ELEMENTAL CALCIUM) 500 MG chewable tablet Chew 1 tablet by mouth 2 (two) times daily as needed for indigestion or heartburn.     . ciprofloxacin (CIPRO) 500 MG tablet     . diphenhydrAMINE (BENADRYL) 25 MG tablet Take 25 mg by mouth daily as needed for allergies.    . hydrochlorothiazide (HYDRODIURIL) 25 MG tablet Take 25 mg by mouth daily.    Marland Kitchen levothyroxine (SYNTHROID) 125 MCG tablet Take 1 tablet (125 mcg total) by mouth daily before breakfast. 30 tablet 0  . lidocaine-prilocaine (EMLA) cream Apply 1 application topically as needed. 30 g 0  . Liniments (BLUE-EMU SUPER STRENGTH EX) Apply 1 application topically daily as needed (muscle pain).    . metFORMIN (GLUCOPHAGE) 500 MG tablet Take 500 mg by mouth daily with breakfast.     . omeprazole (PRILOSEC OTC) 20 MG tablet Take 20 mg by mouth daily as needed (acid reflux).     . ondansetron (ZOFRAN-ODT) 8 MG disintegrating tablet TAKE 1 TABLET (8 MG TOTAL) BY MOUTH EVERY 8 (EIGHT) HOURS AS NEEDED FOR NAUSEA OR VOMITING. 20 tablet 1  . ONDANSETRON HCL PO     . oxyCODONE-acetaminophen (PERCOCET/ROXICET) 5-325 MG tablet Take 1 tablet by mouth daily as needed for severe pain.    Marland Kitchen Phenazopyridine HCl (AZO-STANDARD PO) Take 1 tablet by mouth 2 (two) times daily as needed (uti symptoms).    . potassium chloride (MICRO-K) 10 MEQ CR capsule TAKE 1 CAPSULE (10 MEQ TOTAL) BY MOUTH 2 (TWO) TIMES DAILY. 60 capsule 0  . prochlorperazine (COMPAZINE) 10 MG tablet Take 1 tablet (10 mg total) by mouth every 6 (six) hours as needed for nausea or vomiting. 30 tablet 6  . Propylene Glycol (SYSTANE COMPLETE) 0.6 % SOLN Place 1 drop into both eyes 2 (two) times daily.    . sodium chloride (OCEAN) 0.65 % SOLN nasal spray Place 1 spray into both nostrils as needed for congestion.     No current facility-administered medications for this visit.     PHYSICAL EXAMINATION: ECOG PERFORMANCE STATUS: 1 -  Symptomatic but completely ambulatory  Vitals:   06/30/19 1409  BP: (!) 144/80  Pulse: (!) 101  Resp: 17  Temp: 98.2 F (36.8 C)  SpO2: 98%   Filed Weights   06/30/19 1409  Weight: 156 lb 4.8 oz (70.9 kg)    GENERAL: alert, no distress and comfortable SKIN: skin color, texture, turgor are normal, no rashes or significant lesions EYES: normal, Conjunctiva are pink and non-injected, sclera clear OROPHARYNX: no exudate, no erythema and lips, buccal mucosa, and tongue normal  NECK: supple, thyroid normal size, non-tender, without nodularity LYMPH: no palpable lymphadenopathy in the cervical, axillary or inguinal LUNGS: clear to auscultation and percussion with normal breathing effort HEART: regular rate & rhythm and no murmurs and no lower extremity edema ABDOMEN: abdomen soft, non-tender and normal bowel sounds MUSCULOSKELETAL: no cyanosis of digits and no clubbing  NEURO: alert & oriented x 3 with fluent speech, no focal motor/sensory deficits EXTREMITIES: No lower extremity edema  LABORATORY DATA:  I have reviewed the data as listed CMP Latest Ref Rng & Units 06/10/2019 05/20/2019 04/29/2019  Glucose 70 - 99 mg/dL 161(H) 138(H) 144(H)  BUN 8 -  23 mg/dL '14 14 12  '$ Creatinine 0.44 - 1.00 mg/dL 0.71 0.75 0.66  Sodium 135 - 145 mmol/L 138 140 139  Potassium 3.5 - 5.1 mmol/L 3.6 3.5 3.2(L)  Chloride 98 - 111 mmol/L 106 105 103  CO2 22 - 32 mmol/L '22 27 27  '$ Calcium 8.9 - 10.3 mg/dL 8.4(L) 8.5(L) 8.6(L)  Total Protein 6.5 - 8.1 g/dL 6.4(L) 6.4(L) 6.3(L)  Total Bilirubin 0.3 - 1.2 mg/dL 1.1 1.2 1.2  Alkaline Phos 38 - 126 U/L 81 83 82  AST 15 - 41 U/L 33 39 39  ALT 0 - 44 U/L '12 13 12    '$ Lab Results  Component Value Date   WBC 5.5 06/30/2019   HGB 12.0 06/30/2019   HCT 36.2 06/30/2019   MCV 92.6 06/30/2019   PLT 172 06/30/2019   NEUTROABS 4.1 06/30/2019    ASSESSMENT & PLAN:  Metastatic breast cancer ([place]) Metastatic breast cancer with innumerable liver metastases  detected on ultrasound and recent MRI of the liver on 04/26/2016 (Prior history of left breast IDC T1 1 N0 stage IA 0.3 cm grade 1 tumor that was ER 38%, PR 93%, Ki-67 10%, HER-2 2+, no tissue for FISH testing, 0/1 lymph node negative, status post lumpectomy radiation and 5 years of Arimidex completed in February 2013)  PET/CT scan: 05/07/2016: Extensive metastatic involvement of both lobes of the liver, primary could be liver, phalangeal or metastatic disease, hypermetabolic thoracic lymph nodes left axillary, left supraclavicular and right CP angle lymph nodes, hypermetabolic upper abdominal lymph nodes  Liver biopsy10/07/2016: Metastatic carcinoma breast primary strong positivity for CK 7 weak focal positivity for ER, GCDFP, negative for PR, CDX 2, TTF-1, WT 1; HER-2 positive  Treatment plan: 1. Taxotere Herceptin and Perjeta every 3 weeks palliative chemotherapy started 06/13/16 (Perjeta discontinued after 5 cycles due to allergy) 2. followed by Herceptin maintenance stopped 05/08/2018 due to progression, Kadcyla started --------------------------------------------------------------------------------------------------------------------------------------- 10/14/2018: ERCP with stent placement  Current treatment: Kadcyla starting 10/25/2019todayiscycle18 Patient completed palliative radiation therapy to the brain Echo4/30/2020 shows EF of 60-65%  No clinical signs of metastatic breast cancer progression. Kadcyla toxicities: No side effects to Kadcyla.Will continue.  CT CAP: 02/22/2019:Slight interval decrease in the size of the liver metastases 5.4 cm to 5.1 cm, additional nodules stable Brain MRI November 2020: status post SRS, stable  Since she is responding very well up with systemic therapy, will continue Kadcyla every 3 weeks. I will see her every 6 weeks for follow-ups.  Bilateral lower extremity edema: I discussed with her about Lasix.  She does not want to use  another medication.  She would like to lift her legs up.  I suspect this is related to hypoalbuminemia.  She will also start drinking protein supplements once or twice a day.  Next scan will be in January 2021.    No orders of the defined types were placed in this encounter.  The patient has a good understanding of the overall plan. she agrees with it. she will call with any problems that may develop before the next visit here.  Nicholas Lose, MD 06/30/2019  Julious Oka Dorshimer, am acting as scribe for Dr. Nicholas Lose.  I have reviewed the above documentation for accuracy and completeness, and I agree with the above.

## 2019-06-29 NOTE — Telephone Encounter (Signed)
Dr Rush Landmark the pt called to cancel the procedure. She wants to wait until the first of the year.  She will call back when she is ready.

## 2019-06-30 ENCOUNTER — Other Ambulatory Visit: Payer: Self-pay

## 2019-06-30 ENCOUNTER — Other Ambulatory Visit: Payer: Self-pay | Admitting: *Deleted

## 2019-06-30 ENCOUNTER — Inpatient Hospital Stay: Payer: Medicare Other

## 2019-06-30 ENCOUNTER — Inpatient Hospital Stay: Payer: Medicare Other | Admitting: Hematology and Oncology

## 2019-06-30 VITALS — BP 144/80 | HR 101 | Temp 98.2°F | Resp 17 | Ht 65.0 in | Wt 156.3 lb

## 2019-06-30 VITALS — BP 140/75 | HR 96 | Resp 18

## 2019-06-30 DIAGNOSIS — C787 Secondary malignant neoplasm of liver and intrahepatic bile duct: Secondary | ICD-10-CM

## 2019-06-30 DIAGNOSIS — Z9221 Personal history of antineoplastic chemotherapy: Secondary | ICD-10-CM | POA: Diagnosis not present

## 2019-06-30 DIAGNOSIS — Z95828 Presence of other vascular implants and grafts: Secondary | ICD-10-CM

## 2019-06-30 DIAGNOSIS — C7931 Secondary malignant neoplasm of brain: Secondary | ICD-10-CM | POA: Diagnosis not present

## 2019-06-30 DIAGNOSIS — Z923 Personal history of irradiation: Secondary | ICD-10-CM | POA: Diagnosis not present

## 2019-06-30 DIAGNOSIS — Z5181 Encounter for therapeutic drug level monitoring: Secondary | ICD-10-CM

## 2019-06-30 DIAGNOSIS — R609 Edema, unspecified: Secondary | ICD-10-CM | POA: Diagnosis not present

## 2019-06-30 DIAGNOSIS — Z170 Estrogen receptor positive status [ER+]: Secondary | ICD-10-CM | POA: Diagnosis not present

## 2019-06-30 DIAGNOSIS — Z5112 Encounter for antineoplastic immunotherapy: Secondary | ICD-10-CM | POA: Diagnosis not present

## 2019-06-30 DIAGNOSIS — C50919 Malignant neoplasm of unspecified site of unspecified female breast: Secondary | ICD-10-CM

## 2019-06-30 DIAGNOSIS — Z79899 Other long term (current) drug therapy: Secondary | ICD-10-CM

## 2019-06-30 LAB — CBC WITH DIFFERENTIAL (CANCER CENTER ONLY)
Abs Immature Granulocytes: 0.02 10*3/uL (ref 0.00–0.07)
Basophils Absolute: 0.1 10*3/uL (ref 0.0–0.1)
Basophils Relative: 1 %
Eosinophils Absolute: 0.1 10*3/uL (ref 0.0–0.5)
Eosinophils Relative: 2 %
HCT: 36.2 % (ref 36.0–46.0)
Hemoglobin: 12 g/dL (ref 12.0–15.0)
Immature Granulocytes: 0 %
Lymphocytes Relative: 10 %
Lymphs Abs: 0.6 10*3/uL — ABNORMAL LOW (ref 0.7–4.0)
MCH: 30.7 pg (ref 26.0–34.0)
MCHC: 33.1 g/dL (ref 30.0–36.0)
MCV: 92.6 fL (ref 80.0–100.0)
Monocytes Absolute: 0.6 10*3/uL (ref 0.1–1.0)
Monocytes Relative: 11 %
Neutro Abs: 4.1 10*3/uL (ref 1.7–7.7)
Neutrophils Relative %: 76 %
Platelet Count: 172 10*3/uL (ref 150–400)
RBC: 3.91 MIL/uL (ref 3.87–5.11)
RDW: 16.1 % — ABNORMAL HIGH (ref 11.5–15.5)
WBC Count: 5.5 10*3/uL (ref 4.0–10.5)
nRBC: 0 % (ref 0.0–0.2)

## 2019-06-30 LAB — CMP (CANCER CENTER ONLY)
ALT: 12 U/L (ref 0–44)
AST: 37 U/L (ref 15–41)
Albumin: 2.7 g/dL — ABNORMAL LOW (ref 3.5–5.0)
Alkaline Phosphatase: 91 U/L (ref 38–126)
Anion gap: 9 (ref 5–15)
BUN: 13 mg/dL (ref 8–23)
CO2: 25 mmol/L (ref 22–32)
Calcium: 8 mg/dL — ABNORMAL LOW (ref 8.9–10.3)
Chloride: 103 mmol/L (ref 98–111)
Creatinine: 0.74 mg/dL (ref 0.44–1.00)
GFR, Est AFR Am: >60 mL/min (ref >60)
GFR, Estimated: >60 mL/min (ref >60)
Glucose, Bld: 174 mg/dL — ABNORMAL HIGH (ref 70–99)
Potassium: 3.5 mmol/L (ref 3.5–5.1)
Sodium: 137 mmol/L (ref 135–145)
Total Bilirubin: 1.3 mg/dL — ABNORMAL HIGH (ref 0.3–1.2)
Total Protein: 6.3 g/dL — ABNORMAL LOW (ref 6.5–8.1)

## 2019-06-30 MED ORDER — DIPHENHYDRAMINE HCL 25 MG PO CAPS
50.0000 mg | ORAL_CAPSULE | Freq: Once | ORAL | Status: AC
  Administered 2019-06-30: 50 mg via ORAL

## 2019-06-30 MED ORDER — ACETAMINOPHEN 325 MG PO TABS
ORAL_TABLET | ORAL | Status: AC
  Filled 2019-06-30: qty 2

## 2019-06-30 MED ORDER — HEPARIN SOD (PORK) LOCK FLUSH 100 UNIT/ML IV SOLN
500.0000 [IU] | Freq: Once | INTRAVENOUS | Status: AC | PRN
  Administered 2019-06-30: 500 [IU]
  Filled 2019-06-30: qty 5

## 2019-06-30 MED ORDER — SODIUM CHLORIDE 0.9% FLUSH
10.0000 mL | INTRAVENOUS | Status: DC | PRN
  Administered 2019-06-30: 10 mL via INTRAVENOUS
  Filled 2019-06-30: qty 10

## 2019-06-30 MED ORDER — SODIUM CHLORIDE 0.9% FLUSH
10.0000 mL | INTRAVENOUS | Status: DC | PRN
  Administered 2019-06-30: 10 mL
  Filled 2019-06-30: qty 10

## 2019-06-30 MED ORDER — SODIUM CHLORIDE 0.9 % IV SOLN
3.0000 mg/kg | Freq: Once | INTRAVENOUS | Status: AC
  Administered 2019-06-30: 200 mg via INTRAVENOUS
  Filled 2019-06-30: qty 10

## 2019-06-30 MED ORDER — DIPHENHYDRAMINE HCL 25 MG PO CAPS
ORAL_CAPSULE | ORAL | Status: AC
  Filled 2019-06-30: qty 2

## 2019-06-30 MED ORDER — SODIUM CHLORIDE 0.9 % IV SOLN
Freq: Once | INTRAVENOUS | Status: AC
  Administered 2019-06-30: 15:00:00 via INTRAVENOUS
  Filled 2019-06-30: qty 250

## 2019-06-30 MED ORDER — ACETAMINOPHEN 325 MG PO TABS
650.0000 mg | ORAL_TABLET | Freq: Once | ORAL | Status: AC
  Administered 2019-06-30: 650 mg via ORAL

## 2019-06-30 NOTE — Patient Instructions (Signed)
[facility] Discharge Instructions for Patients Receiving Chemotherapy  Today you received the following chemotherapy agents Kadcyla  To help prevent nausea and vomiting after your treatment, we encourage you to take your nausea medication as directed   If you develop nausea and vomiting that is not controlled by your nausea medication, call the clinic.   BELOW ARE SYMPTOMS THAT SHOULD BE REPORTED IMMEDIATELY:  *FEVER GREATER THAN 100.5 F  *CHILLS WITH OR WITHOUT FEVER  NAUSEA AND VOMITING THAT IS NOT CONTROLLED WITH YOUR NAUSEA MEDICATION  *UNUSUAL SHORTNESS OF BREATH  *UNUSUAL BRUISING OR BLEEDING  TENDERNESS IN MOUTH AND THROAT WITH OR WITHOUT PRESENCE OF ULCERS  *URINARY PROBLEMS  *BOWEL PROBLEMS  UNUSUAL RASH Items with * indicate a potential emergency and should be followed up as soon as possible.  Feel free to call the clinic should you have any questions or concerns. The clinic phone number is [phone].  Please show the CHEMO ALERT CARD at check-in to the Emergency Department and triage nurse.   

## 2019-06-30 NOTE — Patient Instructions (Signed)

## 2019-06-30 NOTE — Assessment & Plan Note (Addendum)
Metastatic breast cancer with innumerable liver metastases detected on ultrasound and recent MRI of the liver on 04/26/2016 (Prior history of left breast IDC T1 1 N0 stage IA 0.3 cm grade 1 tumor that was ER 38%, PR 93%, Ki-67 10%, HER-2 2+, no tissue for FISH testing, 0/1 lymph node negative, status post lumpectomy radiation and 5 years of Arimidex completed in February 2013)  PET/CT scan: 05/07/2016: Extensive metastatic involvement of both lobes of the liver, primary could be liver, phalangeal or metastatic disease, hypermetabolic thoracic lymph nodes left axillary, left supraclavicular and right CP angle lymph nodes, hypermetabolic upper abdominal lymph nodes  Liver biopsy10/07/2016: Metastatic carcinoma breast primary strong positivity for CK 7 weak focal positivity for ER, GCDFP, negative for PR, CDX 2, TTF-1, WT 1; HER-2 positive  Treatment plan: 1. Taxotere Herceptin and Perjeta every 3 weeks palliative chemotherapy started 06/13/16 (Perjeta discontinued after 5 cycles due to allergy) 2. followed by Herceptin maintenance stopped 05/08/2018 due to progression, Kadcyla started --------------------------------------------------------------------------------------------------------------------------------------- 10/14/2018: ERCP with stent placement  Current treatment: Kadcyla starting 10/25/2019todayiscycle18 Patient completed palliative radiation therapy to the brain Echo4/30/2020 shows EF of 60-65%  No clinical signs of metastatic breast cancer progression. Kadcyla toxicities: No side effects to Kadcyla.Will continue.  CT CAP: 02/22/2019:Slight interval decrease in the size of the liver metastases 5.4 cm to 5.1 cm, additional nodules stable Brain MRI 02/18/2019: New 4 mm metastases left posterior frontal vertex, favorable evaluation of treated cerebellar disease, status post SRS  Since she is responding very well up with systemic therapy, will continue Kadcyla every 3  weeks. I will see her every 6 weeks for follow-ups. Next scan will be in January 2021.

## 2019-07-07 ENCOUNTER — Other Ambulatory Visit: Payer: Self-pay

## 2019-07-07 ENCOUNTER — Ambulatory Visit (HOSPITAL_COMMUNITY)
Admission: RE | Admit: 2019-07-07 | Discharge: 2019-07-07 | Disposition: A | Discharge status: Home / Self Care | Payer: Medicare Other | Source: Ambulatory Visit | Attending: Hematology and Oncology | Admitting: Hematology and Oncology

## 2019-07-07 DIAGNOSIS — D649 Anemia, unspecified: Secondary | ICD-10-CM | POA: Diagnosis not present

## 2019-07-07 DIAGNOSIS — I10 Essential (primary) hypertension: Secondary | ICD-10-CM | POA: Insufficient documentation

## 2019-07-07 DIAGNOSIS — I351 Nonrheumatic aortic (valve) insufficiency: Secondary | ICD-10-CM | POA: Diagnosis not present

## 2019-07-07 DIAGNOSIS — E119 Type 2 diabetes mellitus without complications: Secondary | ICD-10-CM | POA: Diagnosis not present

## 2019-07-07 DIAGNOSIS — C50919 Malignant neoplasm of unspecified site of unspecified female breast: Secondary | ICD-10-CM | POA: Insufficient documentation

## 2019-07-07 DIAGNOSIS — Z5181 Encounter for therapeutic drug level monitoring: Secondary | ICD-10-CM

## 2019-07-07 DIAGNOSIS — C787 Secondary malignant neoplasm of liver and intrahepatic bile duct: Secondary | ICD-10-CM | POA: Insufficient documentation

## 2019-07-07 DIAGNOSIS — K219 Gastro-esophageal reflux disease without esophagitis: Secondary | ICD-10-CM | POA: Diagnosis not present

## 2019-07-07 DIAGNOSIS — Z79899 Other long term (current) drug therapy: Secondary | ICD-10-CM | POA: Diagnosis not present

## 2019-07-07 NOTE — Progress Notes (Signed)
  Echocardiogram 2D Echocardiogram has been performed.  Darlina Sicilian M 07/07/2019, 10:48 AM

## 2019-07-19 ENCOUNTER — Other Ambulatory Visit: Payer: Self-pay | Admitting: Hematology and Oncology

## 2019-07-22 ENCOUNTER — Other Ambulatory Visit: Payer: Self-pay

## 2019-07-22 ENCOUNTER — Inpatient Hospital Stay: Payer: Medicare Other

## 2019-07-22 ENCOUNTER — Inpatient Hospital Stay: Payer: Medicare Other | Attending: Hematology and Oncology

## 2019-07-22 VITALS — BP 111/77 | HR 98 | Temp 98.5°F | Resp 16

## 2019-07-22 DIAGNOSIS — K219 Gastro-esophageal reflux disease without esophagitis: Secondary | ICD-10-CM | POA: Insufficient documentation

## 2019-07-22 DIAGNOSIS — E876 Hypokalemia: Secondary | ICD-10-CM | POA: Diagnosis not present

## 2019-07-22 DIAGNOSIS — C50919 Malignant neoplasm of unspecified site of unspecified female breast: Secondary | ICD-10-CM | POA: Insufficient documentation

## 2019-07-22 DIAGNOSIS — E039 Hypothyroidism, unspecified: Secondary | ICD-10-CM | POA: Diagnosis not present

## 2019-07-22 DIAGNOSIS — R600 Localized edema: Secondary | ICD-10-CM | POA: Insufficient documentation

## 2019-07-22 DIAGNOSIS — Z801 Family history of malignant neoplasm of trachea, bronchus and lung: Secondary | ICD-10-CM | POA: Insufficient documentation

## 2019-07-22 DIAGNOSIS — Z170 Estrogen receptor positive status [ER+]: Secondary | ICD-10-CM | POA: Diagnosis not present

## 2019-07-22 DIAGNOSIS — Z5112 Encounter for antineoplastic immunotherapy: Secondary | ICD-10-CM | POA: Diagnosis not present

## 2019-07-22 DIAGNOSIS — I10 Essential (primary) hypertension: Secondary | ICD-10-CM | POA: Insufficient documentation

## 2019-07-22 DIAGNOSIS — C787 Secondary malignant neoplasm of liver and intrahepatic bile duct: Secondary | ICD-10-CM | POA: Insufficient documentation

## 2019-07-22 DIAGNOSIS — Z95828 Presence of other vascular implants and grafts: Secondary | ICD-10-CM

## 2019-07-22 DIAGNOSIS — R5383 Other fatigue: Secondary | ICD-10-CM | POA: Insufficient documentation

## 2019-07-22 DIAGNOSIS — E119 Type 2 diabetes mellitus without complications: Secondary | ICD-10-CM | POA: Diagnosis not present

## 2019-07-22 LAB — CMP (CANCER CENTER ONLY)
ALT: 8 U/L (ref 0–44)
AST: 33 U/L (ref 15–41)
Albumin: 2.5 g/dL — ABNORMAL LOW (ref 3.5–5.0)
Alkaline Phosphatase: 83 U/L (ref 38–126)
Anion gap: 11 (ref 5–15)
BUN: 11 mg/dL (ref 8–23)
CO2: 22 mmol/L (ref 22–32)
Calcium: 7.8 mg/dL — ABNORMAL LOW (ref 8.9–10.3)
Chloride: 104 mmol/L (ref 98–111)
Creatinine: 0.69 mg/dL (ref 0.44–1.00)
GFR, Est AFR Am: >60 mL/min (ref >60)
GFR, Estimated: >60 mL/min (ref >60)
Glucose, Bld: 181 mg/dL — ABNORMAL HIGH (ref 70–99)
Potassium: 3.4 mmol/L — ABNORMAL LOW (ref 3.5–5.1)
Sodium: 137 mmol/L (ref 135–145)
Total Bilirubin: 1.6 mg/dL — ABNORMAL HIGH (ref 0.3–1.2)
Total Protein: 6.5 g/dL (ref 6.5–8.1)

## 2019-07-22 LAB — CBC WITH DIFFERENTIAL (CANCER CENTER ONLY)
Abs Immature Granulocytes: 0.02 10*3/uL (ref 0.00–0.07)
Basophils Absolute: 0.1 10*3/uL (ref 0.0–0.1)
Basophils Relative: 1 %
Eosinophils Absolute: 0.1 10*3/uL (ref 0.0–0.5)
Eosinophils Relative: 2 %
HCT: 35.1 % — ABNORMAL LOW (ref 36.0–46.0)
Hemoglobin: 11.5 g/dL — ABNORMAL LOW (ref 12.0–15.0)
Immature Granulocytes: 0 %
Lymphocytes Relative: 10 %
Lymphs Abs: 0.6 10*3/uL — ABNORMAL LOW (ref 0.7–4.0)
MCH: 30 pg (ref 26.0–34.0)
MCHC: 32.8 g/dL (ref 30.0–36.0)
MCV: 91.6 fL (ref 80.0–100.0)
Monocytes Absolute: 0.7 10*3/uL (ref 0.1–1.0)
Monocytes Relative: 12 %
Neutro Abs: 4.2 10*3/uL (ref 1.7–7.7)
Neutrophils Relative %: 75 %
Platelet Count: 160 10*3/uL (ref 150–400)
RBC: 3.83 MIL/uL — ABNORMAL LOW (ref 3.87–5.11)
RDW: 17.3 % — ABNORMAL HIGH (ref 11.5–15.5)
WBC Count: 5.6 10*3/uL (ref 4.0–10.5)
nRBC: 0 % (ref 0.0–0.2)

## 2019-07-22 MED ORDER — SODIUM CHLORIDE 0.9% FLUSH
10.0000 mL | INTRAVENOUS | Status: DC | PRN
  Administered 2019-07-22: 15:00:00 10 mL
  Filled 2019-07-22: qty 10

## 2019-07-22 MED ORDER — SODIUM CHLORIDE 0.9 % IV SOLN
Freq: Once | INTRAVENOUS | Status: AC
  Filled 2019-07-22: qty 250

## 2019-07-22 MED ORDER — DIPHENHYDRAMINE HCL 25 MG PO CAPS
50.0000 mg | ORAL_CAPSULE | Freq: Once | ORAL | Status: AC
  Administered 2019-07-22: 13:00:00 50 mg via ORAL

## 2019-07-22 MED ORDER — DIPHENHYDRAMINE HCL 25 MG PO CAPS
ORAL_CAPSULE | ORAL | Status: AC
  Filled 2019-07-22: qty 2

## 2019-07-22 MED ORDER — SODIUM CHLORIDE 0.9% FLUSH
10.0000 mL | INTRAVENOUS | Status: DC | PRN
  Administered 2019-07-22: 10 mL via INTRAVENOUS
  Filled 2019-07-22: qty 10

## 2019-07-22 MED ORDER — HEPARIN SOD (PORK) LOCK FLUSH 100 UNIT/ML IV SOLN
500.0000 [IU] | Freq: Once | INTRAVENOUS | Status: AC | PRN
  Administered 2019-07-22: 15:00:00 500 [IU]
  Filled 2019-07-22: qty 5

## 2019-07-22 MED ORDER — ACETAMINOPHEN 325 MG PO TABS
650.0000 mg | ORAL_TABLET | Freq: Once | ORAL | Status: AC
  Administered 2019-07-22: 13:00:00 650 mg via ORAL

## 2019-07-22 MED ORDER — SODIUM CHLORIDE 0.9 % IV SOLN
3.0000 mg/kg | Freq: Once | INTRAVENOUS | Status: AC
  Administered 2019-07-22: 14:00:00 200 mg via INTRAVENOUS
  Filled 2019-07-22: qty 10

## 2019-07-22 MED ORDER — ACETAMINOPHEN 325 MG PO TABS
ORAL_TABLET | ORAL | Status: AC
  Filled 2019-07-22: qty 2

## 2019-07-22 NOTE — Patient Instructions (Signed)
[facility] Discharge Instructions for Patients Receiving Chemotherapy  Today you received the following chemotherapy agents Kadcyla  To help prevent nausea and vomiting after your treatment, we encourage you to take your nausea medication as directed   If you develop nausea and vomiting that is not controlled by your nausea medication, call the clinic.   BELOW ARE SYMPTOMS THAT SHOULD BE REPORTED IMMEDIATELY:  *FEVER GREATER THAN 100.5 F  *CHILLS WITH OR WITHOUT FEVER  NAUSEA AND VOMITING THAT IS NOT CONTROLLED WITH YOUR NAUSEA MEDICATION  *UNUSUAL SHORTNESS OF BREATH  *UNUSUAL BRUISING OR BLEEDING  TENDERNESS IN MOUTH AND THROAT WITH OR WITHOUT PRESENCE OF ULCERS  *URINARY PROBLEMS  *BOWEL PROBLEMS  UNUSUAL RASH Items with * indicate a potential emergency and should be followed up as soon as possible.  Feel free to call the clinic should you have any questions or concerns. The clinic phone number is [phone].  Please show the CHEMO ALERT CARD at check-in to the Emergency Department and triage nurse.   

## 2019-07-22 NOTE — Progress Notes (Signed)
Okay to treat today with bili 1.6, per Dr. [name].

## 2019-07-24 ENCOUNTER — Other Ambulatory Visit (HOSPITAL_COMMUNITY): Payer: Medicare Other

## 2019-07-28 ENCOUNTER — Encounter (HOSPITAL_COMMUNITY): Payer: Self-pay

## 2019-07-28 ENCOUNTER — Ambulatory Visit (HOSPITAL_COMMUNITY): Admit: 2019-07-28 | Payer: Medicare Other | Admitting: Gastroenterology

## 2019-07-28 SURGERY — ENDOSCOPIC RETROGRADE CHOLANGIOPANCREATOGRAPHY (ERCP) WITH PROPOFOL
Anesthesia: General

## 2019-08-03 ENCOUNTER — Other Ambulatory Visit: Payer: Self-pay

## 2019-08-03 ENCOUNTER — Telehealth: Payer: Self-pay

## 2019-08-03 DIAGNOSIS — C50919 Malignant neoplasm of unspecified site of unspecified female breast: Secondary | ICD-10-CM

## 2019-08-03 NOTE — Telephone Encounter (Signed)
RN returned call, voicemail left for return call.  

## 2019-08-03 NOTE — Telephone Encounter (Signed)
Pt's son/daughter in law called to report patient had fall X 1 week ago.  Per daughter in law, patient has "not been feeling like herself since."    Daughter in law reporting no injuries, but noticed peripheral edema.  Denies pt having any SHOB, weight gain, or confusion.  Reports urine is dark in color.    RN review with MD, recommendations for evaluation by our symptom management clinic.  Reviewed with SM nurse, patient scheduled and aware.

## 2019-08-04 ENCOUNTER — Other Ambulatory Visit: Payer: Self-pay

## 2019-08-04 ENCOUNTER — Inpatient Hospital Stay (HOSPITAL_BASED_OUTPATIENT_CLINIC_OR_DEPARTMENT_OTHER): Payer: Medicare Other | Admitting: Medical

## 2019-08-04 VITALS — BP 132/74 | HR 96 | Temp 98.3°F | Resp 17 | Ht 65.0 in | Wt 182.0 lb

## 2019-08-04 DIAGNOSIS — E119 Type 2 diabetes mellitus without complications: Secondary | ICD-10-CM | POA: Diagnosis not present

## 2019-08-04 DIAGNOSIS — Z95828 Presence of other vascular implants and grafts: Secondary | ICD-10-CM | POA: Diagnosis not present

## 2019-08-04 DIAGNOSIS — C50919 Malignant neoplasm of unspecified site of unspecified female breast: Secondary | ICD-10-CM | POA: Diagnosis not present

## 2019-08-04 DIAGNOSIS — R609 Edema, unspecified: Secondary | ICD-10-CM | POA: Diagnosis not present

## 2019-08-04 DIAGNOSIS — R5383 Other fatigue: Secondary | ICD-10-CM | POA: Diagnosis not present

## 2019-08-04 DIAGNOSIS — K219 Gastro-esophageal reflux disease without esophagitis: Secondary | ICD-10-CM | POA: Diagnosis not present

## 2019-08-04 DIAGNOSIS — Z170 Estrogen receptor positive status [ER+]: Secondary | ICD-10-CM | POA: Diagnosis not present

## 2019-08-04 DIAGNOSIS — I10 Essential (primary) hypertension: Secondary | ICD-10-CM | POA: Diagnosis not present

## 2019-08-04 DIAGNOSIS — E876 Hypokalemia: Secondary | ICD-10-CM | POA: Diagnosis not present

## 2019-08-04 DIAGNOSIS — R600 Localized edema: Secondary | ICD-10-CM

## 2019-08-04 DIAGNOSIS — Z5112 Encounter for antineoplastic immunotherapy: Secondary | ICD-10-CM | POA: Diagnosis not present

## 2019-08-04 DIAGNOSIS — C787 Secondary malignant neoplasm of liver and intrahepatic bile duct: Secondary | ICD-10-CM | POA: Diagnosis not present

## 2019-08-04 DIAGNOSIS — Z801 Family history of malignant neoplasm of trachea, bronchus and lung: Secondary | ICD-10-CM | POA: Diagnosis not present

## 2019-08-04 DIAGNOSIS — E039 Hypothyroidism, unspecified: Secondary | ICD-10-CM | POA: Diagnosis not present

## 2019-08-04 LAB — CBC WITH DIFFERENTIAL (CANCER CENTER ONLY)
Abs Immature Granulocytes: 0.01 10*3/uL (ref 0.00–0.07)
Basophils Absolute: 0.1 10*3/uL (ref 0.0–0.1)
Basophils Relative: 1 %
Eosinophils Absolute: 0.1 10*3/uL (ref 0.0–0.5)
Eosinophils Relative: 2 %
HCT: 33.8 % — ABNORMAL LOW (ref 36.0–46.0)
Hemoglobin: 11.4 g/dL — ABNORMAL LOW (ref 12.0–15.0)
Immature Granulocytes: 0 %
Lymphocytes Relative: 8 %
Lymphs Abs: 0.5 10*3/uL — ABNORMAL LOW (ref 0.7–4.0)
MCH: 30 pg (ref 26.0–34.0)
MCHC: 33.7 g/dL (ref 30.0–36.0)
MCV: 88.9 fL (ref 80.0–100.0)
Monocytes Absolute: 0.9 10*3/uL (ref 0.1–1.0)
Monocytes Relative: 14 %
Neutro Abs: 4.7 10*3/uL (ref 1.7–7.7)
Neutrophils Relative %: 75 %
Platelet Count: 158 10*3/uL (ref 150–400)
RBC: 3.8 MIL/uL — ABNORMAL LOW (ref 3.87–5.11)
RDW: 17.2 % — ABNORMAL HIGH (ref 11.5–15.5)
WBC Count: 6.3 10*3/uL (ref 4.0–10.5)
nRBC: 0 % (ref 0.0–0.2)

## 2019-08-04 LAB — CMP (CANCER CENTER ONLY)
ALT: 10 U/L (ref 0–44)
AST: 38 U/L (ref 15–41)
Albumin: 2.3 g/dL — ABNORMAL LOW (ref 3.5–5.0)
Alkaline Phosphatase: 79 U/L (ref 38–126)
Anion gap: 12 (ref 5–15)
BUN: 10 mg/dL (ref 8–23)
CO2: 24 mmol/L (ref 22–32)
Calcium: 7.9 mg/dL — ABNORMAL LOW (ref 8.9–10.3)
Chloride: 100 mmol/L (ref 98–111)
Creatinine: 0.63 mg/dL (ref 0.44–1.00)
GFR, Est AFR Am: >60 mL/min (ref >60)
GFR, Estimated: >60 mL/min (ref >60)
Glucose, Bld: 104 mg/dL — ABNORMAL HIGH (ref 70–99)
Potassium: 3.1 mmol/L — ABNORMAL LOW (ref 3.5–5.1)
Sodium: 136 mmol/L (ref 135–145)
Total Bilirubin: 1.3 mg/dL — ABNORMAL HIGH (ref 0.3–1.2)
Total Protein: 6.4 g/dL — ABNORMAL LOW (ref 6.5–8.1)

## 2019-08-04 LAB — SAMPLE TO BLOOD BANK

## 2019-08-04 MED ORDER — HEPARIN SOD (PORK) LOCK FLUSH 100 UNIT/ML IV SOLN
500.0000 [IU] | Freq: Once | INTRAVENOUS | Status: AC | PRN
  Administered 2019-08-04: 12:00:00 500 [IU] via INTRAVENOUS
  Filled 2019-08-04: qty 5

## 2019-08-04 MED ORDER — FUROSEMIDE 20 MG PO TABS: 40.0000 mg | ORAL_TABLET | Freq: Every day | ORAL | 0 refills | Status: DC

## 2019-08-04 MED ORDER — SODIUM CHLORIDE 0.9% FLUSH
10.0000 mL | INTRAVENOUS | Status: DC | PRN
  Administered 2019-08-04: 12:00:00 10 mL via INTRAVENOUS
  Filled 2019-08-04: qty 10

## 2019-08-04 NOTE — Patient Instructions (Signed)
COVID-19: How to Protect Yourself and Others Know how it spreads  There is currently no vaccine to prevent coronavirus disease 2019 (COVID-19).  The best way to prevent illness is to avoid being exposed to this virus.  The virus is thought to spread mainly from person-to-person. ? Between people who are in close contact with one another (within about 6 feet). ? Through respiratory droplets produced when an infected person coughs, sneezes or talks. ? These droplets can land in the mouths or noses of people who are nearby or possibly be inhaled into the lungs. ? Some recent studies have suggested that COVID-19 may be spread by people who are not showing symptoms. Everyone should Clean your hands often  Wash your hands often with soap and water for at least 20 seconds especially after you have been in a public place, or after blowing your nose, coughing, or sneezing.  If soap and water are not readily available, use a hand sanitizer that contains at least 60% alcohol. Cover all surfaces of your hands and rub them together until they feel dry.  Avoid touching your eyes, nose, and mouth with unwashed hands. Avoid close contact  Stay home if you are sick.  Avoid close contact with people who are sick.  Put distance between yourself and other people. ? Remember that some people without symptoms may be able to spread virus. ? This is especially important for people who are at higher risk of getting very sick.www.cdc.gov/coronavirus/2019-ncov/need-extra-precautions/people-at-higher-risk.html Cover your mouth and nose with a cloth face cover when around others  You could spread COVID-19 to others even if you do not feel sick.  Everyone should wear a cloth face cover when they have to go out in public, for example to the grocery store or to pick up other necessities. ? Cloth face coverings should not be placed on young children under age 2, anyone who has trouble breathing, or is unconscious,  incapacitated or otherwise unable to remove the mask without assistance.  The cloth face cover is meant to protect other people in case you are infected.  Do NOT use a facemask meant for a healthcare worker.  Continue to keep about 6 feet between yourself and others. The cloth face cover is not a substitute for social distancing. Cover coughs and sneezes  If you are in a private setting and do not have on your cloth face covering, remember to always cover your mouth and nose with a tissue when you cough or sneeze or use the inside of your elbow.  Throw used tissues in the trash.  Immediately wash your hands with soap and water for at least 20 seconds. If soap and water are not readily available, clean your hands with a hand sanitizer that contains at least 60% alcohol. Clean and disinfect  Clean AND disinfect frequently touched surfaces daily. This includes tables, doorknobs, light switches, countertops, handles, desks, phones, keyboards, toilets, faucets, and sinks. www.cdc.gov/coronavirus/2019-ncov/prevent-getting-sick/disinfecting-your-home.html  If surfaces are dirty, clean them: Use detergent or soap and water prior to disinfection.  Then, use a household disinfectant. You can see a list of EPA-registered household disinfectants here. cdc.gov/coronavirus 12/08/2018 This information is not intended to replace advice given to you by your health care provider. Make sure you discuss any questions you have with your health care provider. Document Released: 11/17/2018 Document Revised: 12/16/2018 Document Reviewed: 11/17/2018 Elsevier Patient Education  2020 Elsevier Inc.  

## 2019-08-04 NOTE — Progress Notes (Signed)
VO per PA Lucianne Lei to cancel urine sample, lab aware.

## 2019-08-04 NOTE — Progress Notes (Signed)
Per Sandi Mealy, PA-C verbal orders for DME for wheelchair and hospital bed.  RN placed orders.  Zack with [employer] notified.

## 2019-08-04 NOTE — Progress Notes (Signed)
These results were reviewed with the patient.

## 2019-08-04 NOTE — Progress Notes (Signed)
Patient suffers from metastatic breast cancer which impairs their ability to perform daily activities like ambulation in the home, and daily ADL's with self care. A cane or walker will not resolve issue with performing activities of daily living. A wheelchair will allow patient to safely perform daily activities. Patient is not able to propel themselves in the home using a standard weight wheelchair due to weakness. Patient can self propel in the lightweight wheelchair. Length of need lifetime.  Accessories: elevating leg rests (ELRs), wheel locks, extensions and anti-tippers.  Question Answer Comment  Length of Need Lifetime   Patient has (list medical condition): Metastatic breast cancer   The above medical condition requires: Patient requires the ability to reposition frequently   Bed type Semi-electric   Support Surface: Gel Overlay

## 2019-08-05 ENCOUNTER — Other Ambulatory Visit: Payer: Self-pay | Admitting: *Deleted

## 2019-08-05 DIAGNOSIS — C7931 Secondary malignant neoplasm of brain: Secondary | ICD-10-CM

## 2019-08-06 ENCOUNTER — Other Ambulatory Visit: Payer: Self-pay | Admitting: Medical

## 2019-08-06 DIAGNOSIS — C787 Secondary malignant neoplasm of liver and intrahepatic bile duct: Secondary | ICD-10-CM

## 2019-08-06 DIAGNOSIS — C50919 Malignant neoplasm of unspecified site of unspecified female breast: Secondary | ICD-10-CM

## 2019-08-06 DIAGNOSIS — R601 Generalized edema: Secondary | ICD-10-CM

## 2019-08-06 DIAGNOSIS — E876 Hypokalemia: Secondary | ICD-10-CM

## 2019-08-07 ENCOUNTER — Other Ambulatory Visit: Payer: Self-pay

## 2019-08-07 ENCOUNTER — Inpatient Hospital Stay: Payer: Medicare Other | Attending: Hematology and Oncology | Admitting: Medical

## 2019-08-07 ENCOUNTER — Other Ambulatory Visit: Payer: Self-pay | Admitting: Medical

## 2019-08-07 VITALS — BP 138/74 | HR 97 | Wt 185.5 lb

## 2019-08-07 DIAGNOSIS — Z923 Personal history of irradiation: Secondary | ICD-10-CM | POA: Diagnosis not present

## 2019-08-07 DIAGNOSIS — Z801 Family history of malignant neoplasm of trachea, bronchus and lung: Secondary | ICD-10-CM | POA: Insufficient documentation

## 2019-08-07 DIAGNOSIS — R601 Generalized edema: Secondary | ICD-10-CM | POA: Insufficient documentation

## 2019-08-07 DIAGNOSIS — C50919 Malignant neoplasm of unspecified site of unspecified female breast: Secondary | ICD-10-CM | POA: Diagnosis not present

## 2019-08-07 DIAGNOSIS — Z9221 Personal history of antineoplastic chemotherapy: Secondary | ICD-10-CM | POA: Diagnosis not present

## 2019-08-07 DIAGNOSIS — C787 Secondary malignant neoplasm of liver and intrahepatic bile duct: Secondary | ICD-10-CM | POA: Insufficient documentation

## 2019-08-07 DIAGNOSIS — E119 Type 2 diabetes mellitus without complications: Secondary | ICD-10-CM | POA: Diagnosis not present

## 2019-08-07 DIAGNOSIS — C786 Secondary malignant neoplasm of retroperitoneum and peritoneum: Secondary | ICD-10-CM | POA: Diagnosis not present

## 2019-08-07 DIAGNOSIS — K219 Gastro-esophageal reflux disease without esophagitis: Secondary | ICD-10-CM | POA: Diagnosis not present

## 2019-08-07 DIAGNOSIS — C7931 Secondary malignant neoplasm of brain: Secondary | ICD-10-CM | POA: Diagnosis not present

## 2019-08-07 DIAGNOSIS — R600 Localized edema: Secondary | ICD-10-CM | POA: Diagnosis not present

## 2019-08-07 DIAGNOSIS — Z803 Family history of malignant neoplasm of breast: Secondary | ICD-10-CM | POA: Diagnosis not present

## 2019-08-07 DIAGNOSIS — E876 Hypokalemia: Secondary | ICD-10-CM

## 2019-08-07 DIAGNOSIS — I10 Essential (primary) hypertension: Secondary | ICD-10-CM | POA: Insufficient documentation

## 2019-08-07 DIAGNOSIS — Z79899 Other long term (current) drug therapy: Secondary | ICD-10-CM | POA: Insufficient documentation

## 2019-08-07 DIAGNOSIS — E039 Hypothyroidism, unspecified: Secondary | ICD-10-CM | POA: Diagnosis not present

## 2019-08-07 LAB — CBC WITH DIFFERENTIAL (CANCER CENTER ONLY)
Abs Immature Granulocytes: 0.01 10*3/uL (ref 0.00–0.07)
Basophils Absolute: 0.1 10*3/uL (ref 0.0–0.1)
Basophils Relative: 1 %
Eosinophils Absolute: 0.1 10*3/uL (ref 0.0–0.5)
Eosinophils Relative: 2 %
HCT: 35.9 % — ABNORMAL LOW (ref 36.0–46.0)
Hemoglobin: 12.1 g/dL (ref 12.0–15.0)
Immature Granulocytes: 0 %
Lymphocytes Relative: 8 %
Lymphs Abs: 0.4 10*3/uL — ABNORMAL LOW (ref 0.7–4.0)
MCH: 30.1 pg (ref 26.0–34.0)
MCHC: 33.7 g/dL (ref 30.0–36.0)
MCV: 89.3 fL (ref 80.0–100.0)
Monocytes Absolute: 0.8 10*3/uL (ref 0.1–1.0)
Monocytes Relative: 15 %
Neutro Abs: 4.1 10*3/uL (ref 1.7–7.7)
Neutrophils Relative %: 74 %
Platelet Count: 199 10*3/uL (ref 150–400)
RBC: 4.02 MIL/uL (ref 3.87–5.11)
RDW: 17.6 % — ABNORMAL HIGH (ref 11.5–15.5)
WBC Count: 5.5 10*3/uL (ref 4.0–10.5)
nRBC: 0 % (ref 0.0–0.2)

## 2019-08-07 LAB — CMP (CANCER CENTER ONLY)
ALT: 12 U/L (ref 0–44)
AST: 39 U/L (ref 15–41)
Albumin: 2.4 g/dL — ABNORMAL LOW (ref 3.5–5.0)
Alkaline Phosphatase: 75 U/L (ref 38–126)
Anion gap: 13 (ref 5–15)
BUN: 11 mg/dL (ref 8–23)
CO2: 28 mmol/L (ref 22–32)
Calcium: 7.7 mg/dL — ABNORMAL LOW (ref 8.9–10.3)
Chloride: 94 mmol/L — ABNORMAL LOW (ref 98–111)
Creatinine: 0.71 mg/dL (ref 0.44–1.00)
GFR, Est AFR Am: >60 mL/min (ref >60)
GFR, Estimated: >60 mL/min (ref >60)
Glucose, Bld: 119 mg/dL — ABNORMAL HIGH (ref 70–99)
Potassium: 2.3 mmol/L — CL (ref 3.5–5.1)
Sodium: 135 mmol/L (ref 135–145)
Total Bilirubin: 2.2 mg/dL — ABNORMAL HIGH (ref 0.3–1.2)
Total Protein: 6.6 g/dL (ref 6.5–8.1)

## 2019-08-07 LAB — BRAIN NATRIURETIC PEPTIDE: B Natriuretic Peptide: 42.6 pg/mL (ref 0.0–100.0)

## 2019-08-07 NOTE — Progress Notes (Signed)
Symptoms Management Clinic Progress Note   EMER GLANDON [id number] [birth year] 86 y.o.  Monique Figueroa is managed by Dr. Nicholas Lose  Actively treated with chemotherapy/immunotherapy/hormonal therapy: yes  Current therapy:  Kadcyla   Last treated: 07/22/2019 (cycle 21, day 1)  Next scheduled appointment with provider: 08/12/2019  Assessment: Plan:    Metastases to the liver ([place]) - Plan: BNP (Brain natriuretic peptide), CMP ([place] only), CBC with Differential ([place] Only)  Hypokalemia - Plan: BNP (Brain natriuretic peptide), CMP ([place] only), CBC with Differential ([place] Only)  Generalized edema - Plan: BNP (Brain natriuretic peptide), CMP ([place] only), CBC with Differential ([place] Only)  Metastatic breast cancer ([place]) - Plan: BNP (Brain natriuretic peptide), CMP ([place] only), CBC with Differential ([place] Only)   Metastatic breast cancer: The patient continues to be managed by  Dr. Nicholas Lose and is status post cycle 21, day 1 of Kadcyla which was dosed on 07/22/2019.  She is scheduled to be seen in follow-up on 08/12/2019.  Hypokalemia: A chemistry panel returned today with a potassium of 2.3.  The patient was called with these results.  It was recommended that she come to the ER at [facility] for management of her critically low potassium level.  This was discussed with with Dr. Julien Nordmann who is in agreement with these recommendations.  The patient did not want to come to the emergency room given that she could possibly be admitted.  The risk of cardiac complications was discussed with the patient's son.  The patient was told to decrease her Lasix to 20 mg daily and increase her potassium to 80 mEq for today, Sunday, and Monday.  She will return on Monday for repeat labs.  She was told to contact 911 or present to the emergency room should she note changes in her pulse or chest discomfort.  She and her  son contacted her daughter-in-law who is a physician in [place] and reviewed her labs and the above plan.  According to the patient's son his sister-in-law expressed agreement with this plan.  Peripheral edema: A chemistry panel returned today with an albumin still low at 2.4.  A BNP returned normal at 42.6.  She was told to decrease her Lasix to 20 mg once daily until her return on 08/09/2019.  A repeat echocardiogram has been ordered.  Her weight continues to be elevated today at 185.5 pounds.  Please see After Visit Summary for patient specific instructions.  Future Appointments  Date Time Provider [place]  08/12/2019  1:00 PM [facility] LAB 5 [facility] None  08/12/2019  1:15 PM [facility] [place] FLUSH [facility] None  08/12/2019  1:30 PM Gardenia Phlegm, NP [facility] None  08/12/2019  2:30 PM [facility] INFUSION [facility] None  09/02/2019 12:15 PM [facility] LAB 1 [facility] None  09/02/2019 12:30 PM [facility] FLUSH [facility] None  09/02/2019  1:30 PM [facility] INFUSION [facility] None  09/22/2019  2:30 PM Bruning, Ashlyn, PA-C [facility] None  09/23/2019 11:00 AM [facility] LAB 4 [facility] None  09/23/2019 11:15 AM [facility] [place] FLUSH [facility] None  09/23/2019 11:45 AM Nicholas Lose, MD [facility] None  09/23/2019 12:30 PM [facility] INFUSION [facility] None    No orders of the defined types were placed in this encounter.      Subjective:   Patient ID:  Monique Figueroa is a 86 y.o. (DOB 23-Feb-1934) female.  Chief Complaint:  No chief complaint on file.   HPI [employer]  Is a 86 y.o. female with a diagnosis of a metastatic breast cancer with innumerable liver metastases. She is managed by Dr. [name] and is currently treated with Kadcyla.  She most recently received cycle 21, day 1 on 07/22/2019.  She presents to the clinic today in follow-up of an appointment on 08/04/2019 at which time she was seen for increased weight and peripheral edema.   She weighed 156 pounds on 06/30/2019 and weighed 182 pounds at her last appointment.  Her weight continues to be elevated at 185.5 pounds today.  Despite her continued weight gain and peripheral edema she reports that she is feeling better with less shortness of breath and dyspnea on exertion.  She is walking with a walker at home and is able to perform her activities of daily living more easily.  She reports that she is eating well. An echocardiogram was completed most recently on 07/07/2019 and returned with an EF calculated between 65 and 70%.  A repeat echocardiogram has been ordered.  Medications: I have reviewed the patient's current medications.  Allergies:  Allergies  Allergen Reactions  . Perjeta [Pertuzumab] Shortness Of Breath and Palpitations    Turned red   . Sulfa Antibiotics Swelling    Past Medical History:  Diagnosis Date  . Anemia   . Appendicitis   . Arthritis   . Biliary dyskinesia 03/24/2018  . Brain metastases ([place])   . Breast cancer ([place]) 06/2006   Invasive ductal and DCIS of left breast  . Breast tumor    History of bilateral breast tumors/cysts  . Cholecystitis   . Cyst of spinal meninges   . Diabetes mellitus without complication ([place])   . GERD (gastroesophageal reflux disease)   . Hypertension   . Hypothyroidism   . Metastatic breast cancer ([place]) 04/26/2016   mets to liver  . Thyroid cancer ([place])   . Tuberculosis    medullary carcinoma ; denies TB   . UTI (urinary tract infection)    hx    Past Surgical History:  Procedure Laterality Date  . APPENDECTOMY    . BALLOON DILATION N/A 04/20/2018   Procedure: BALLOON DILATION;  Surgeon: Rush Landmark Telford Nab., MD;  Location: [place];  Service: Gastroenterology;  Laterality: N/A;  . BILIARY DILATION  10/14/2018   Procedure: BILIARY DILATION;  Surgeon: Rush Landmark Telford Nab., MD;  Location: [facility];  Service: Gastroenterology;;  . BILIARY STENT PLACEMENT  04/20/2018   Procedure: BILIARY STENT  PLACEMENT;  Surgeon: Irving Copas., MD;  Location: [place];  Service: Gastroenterology;;  . BILIARY STENT PLACEMENT N/A 10/14/2018   Procedure: BILIARY STENT PLACEMENT;  Surgeon: Irving Copas., MD;  Location: [facility];  Service: Gastroenterology;  Laterality: N/A;  . BIOPSY  10/14/2018   Procedure: BIOPSY;  Surgeon: Rush Landmark Telford Nab., MD;  Location: [facility];  Service: Gastroenterology;;  . BREAST CYST EXCISION Bilateral    Several asprirations and excisions  . BREAST LUMPECTOMY WITH AXILLARY LYMPH NODE BIOPSY Left 06/19/2006   Invasive ductal and in situ carcinoma, node negative  . CATARACT EXTRACTION, BILATERAL  2009  . CHOLECYSTECTOMY N/A 03/24/2018   Procedure: DIAGNOSTIC LAPAROSCOPY;  Surgeon: Fanny Skates, MD;  Location: [facility];  Service: General;  Laterality: N/A;  . ENDOSCOPIC RETROGRADE CHOLANGIOPANCREATOGRAPHY (ERCP) WITH PROPOFOL N/A 04/20/2018   Procedure: ENDOSCOPIC RETROGRADE CHOLANGIOPANCREATOGRAPHY (ERCP) WITH PROPOFOL ;  Surgeon: Irving Copas., MD;  Location: [place];  Service: Gastroenterology;  Laterality: N/A;  . ENDOSCOPIC RETROGRADE CHOLANGIOPANCREATOGRAPHY (ERCP) WITH PROPOFOL N/A 10/14/2018   Procedure:  ENDOSCOPIC RETROGRADE CHOLANGIOPANCREATOGRAPHY (ERCP) WITH PROPOFOL;  Surgeon: Rush Landmark Telford Nab., MD;  [facility]: [facility];  Service: Gastroenterology;  Laterality: N/A;  2 hour case  . PORTACATH PLACEMENT N/A 06/05/2016   Procedure: INSERTION PORT-A-CATH WITH [place];  Surgeon: Fanny Skates, MD;  Location: [place];  Service: General;  Laterality: N/A;  . REMOVAL OF STONES  10/14/2018   Procedure: REMOVAL OF STONES;  Surgeon: Irving Copas., MD;  Location: [facility];  Service: Gastroenterology;;  . Joan Mayans  04/20/2018   Procedure: Joan Mayans;  Surgeon: Irving Copas., MD;  Location: [place];  Service: Gastroenterology;;  . [place]    . STENT REMOVAL   10/14/2018   Procedure: STENT REMOVAL;  Surgeon: Irving Copas., MD;  Location: [facility];  Service: Gastroenterology;;  . TOTAL THYROIDECTOMY      Family History  Problem Relation Age of Onset  . Rheum arthritis Mother   . Coronary artery disease Mother   . Dementia Mother   . Diabetes Mother   . Heart Problems Father   . Cancer Sister        Breast cancer  . Cancer Brother        Lung cancer  . Diabetes Sister   . Dementia Sister   . Colon cancer Neg Hx   . Esophageal cancer Neg Hx   . Inflammatory bowel disease Neg Hx   . Liver disease Neg Hx   . Pancreatic cancer Neg Hx   . Rectal cancer Neg Hx   . Stomach cancer Neg Hx     Social History   Socioeconomic History  . Marital status: Widowed    Spouse name: Not on file  . Number of children: 1  . Years of education: Not on file  . Highest education level: Not on file  Occupational History  . Not on file  Tobacco Use  . Smoking status: Never Smoker  . Smokeless tobacco: Never Used  Substance and Sexual Activity  . Alcohol use: No  . Drug use: No  . Sexual activity: Not Currently    Birth control/protection: Post-menopausal  Other Topics Concern  . Not on file  Social History Narrative  . Not on file   Social Determinants of Health   Financial Resource Strain:   . Difficulty of Paying Living Expenses: Not on file  Food Insecurity:   . Worried About Charity fundraiser in the Last Year: Not on file  . Ran Out of Food in the Last Year: Not on file  Transportation Needs:   . Lack of Transportation (Medical): Not on file  . Lack of Transportation (Non-Medical): Not on file  Physical Activity:   . Days of Exercise per Week: Not on file  . Minutes of Exercise per Session: Not on file  Stress:   . Feeling of Stress : Not on file  Social Connections:   . Frequency of Communication with Friends and Family: Not on file  . Frequency of Social Gatherings with Friends and Family: Not on file  . Attends  Religious Services: Not on file  . Active Member of Clubs or Organizations: Not on file  . Attends Archivist Meetings: Not on file  . Marital Status: Not on file  Intimate Partner Violence:   . Fear of Current or Ex-Partner: Not on file  . Emotionally Abused: Not on file  . Physically Abused: Not on file  . Sexually Abused: Not on file    Past Medical History, Surgical  history, Social history, and Family history were reviewed and updated as appropriate.   Please see review of systems for further details on the patient's review from today.   Review of Systems:  Review of Systems  Constitutional: Negative for chills, diaphoresis and fever.  HENT: Negative for trouble swallowing and voice change.   Respiratory: Negative for cough, choking, chest tightness, shortness of breath, wheezing and stridor.           Cardiovascular: Positive for leg swelling. Negative for chest pain and palpitations.  Gastrointestinal: Negative for abdominal pain, constipation, diarrhea, nausea and vomiting.  Genitourinary: Negative for difficulty urinating and frequency.          Musculoskeletal: Negative for back pain and myalgias.  Neurological: Negative for dizziness, light-headedness and headaches.    Objective:   Physical Exam:  There were no vitals taken for this visit. ECOG: 1  Physical Exam Constitutional:      General: She is not in acute distress.    Appearance: She is not diaphoretic.  HENT:     Head: Normocephalic and atraumatic.  Cardiovascular:     Rate and Rhythm: Normal rate and regular rhythm.     Heart sounds: Normal heart sounds. No murmur. No friction rub. No gallop.   Pulmonary:     Effort: Pulmonary effort is normal. No respiratory distress.     Breath sounds: Examination of the left-lower field reveals decreased breath sounds. Decreased breath sounds present. No wheezing or rales.  Abdominal:     General: There is no distension.     Tenderness: There is no  abdominal tenderness. There is no guarding.  Musculoskeletal:     Right lower leg: Edema present.     Left lower leg: Edema present.     Comments: 2+ pitting edema to the knees bilaterally.  Skin:    General: Skin is warm and dry.     Findings: No erythema or rash.  Neurological:     Mental Status: She is alert.     Gait: Gait abnormal.     Comments: The patient is ambulating with the use of a wheelchair.     Lab Review:     Component Value Date/Time   NA 135 08/07/2019 1000   NA 139 08/07/2017 0905   K 2.3 (LL) 08/07/2019 1000   K 3.7 08/07/2017 0905   CL 94 (L) 08/07/2019 1000   CL 104 06/09/2012 1402   CO2 28 08/07/2019 1000   CO2 27 08/07/2017 0905   GLUCOSE 119 (H) 08/07/2019 1000   GLUCOSE 102 08/07/2017 0905   GLUCOSE 153 (H) 06/09/2012 1402   BUN 11 08/07/2019 1000   BUN 14.7 08/07/2017 0905   CREATININE 0.71 08/07/2019 1000   CREATININE 0.8 08/07/2017 0905   CALCIUM 7.7 (L) 08/07/2019 1000   CALCIUM 9.3 08/07/2017 0905   PROT 6.6 08/07/2019 1000   PROT 7.3 08/07/2017 0905   ALBUMIN 2.4 (L) 08/07/2019 1000   ALBUMIN 3.9 08/07/2017 0905   AST 39 08/07/2019 1000   AST 22 08/07/2017 0905   ALT 12 08/07/2019 1000   ALT 14 08/07/2017 0905   ALKPHOS 75 08/07/2019 1000   ALKPHOS 68 08/07/2017 0905   BILITOT 2.2 (H) 08/07/2019 1000   BILITOT 0.56 08/07/2017 0905   GFRNONAA >60 08/07/2019 1000   GFRAA >60 08/07/2019 1000       Component Value Date/Time   WBC 5.5 08/07/2019 1000   WBC 6.2 08/03/2018 0551   RBC 4.02 08/07/2019 1000  HGB 12.1 08/07/2019 1000   HGB 12.5 08/07/2017 0905   HCT 35.9 (L) 08/07/2019 1000   HCT 37.3 08/07/2017 0905   PLT 199 08/07/2019 1000   PLT 195 08/07/2017 0905   MCV 89.3 08/07/2019 1000   MCV 93.6 08/07/2017 0905   MCH 30.1 08/07/2019 1000   MCHC 33.7 08/07/2019 1000   RDW 17.6 (H) 08/07/2019 1000   RDW 13.2 08/07/2017 0905   LYMPHSABS 0.4 (L) 08/07/2019 1000   LYMPHSABS 1.1 08/07/2017 0905   MONOABS 0.8 08/07/2019  1000   MONOABS 0.4 08/07/2017 0905   EOSABS 0.1 08/07/2019 1000   EOSABS 0.1 08/07/2017 0905   BASOSABS 0.1 08/07/2019 1000   BASOSABS 0.0 08/07/2017 0905   -------------------------------  Imaging from last 24 hours (if applicable):  Radiology interpretation: No results found.      This case was discussed with Dr. Julien Nordmann.

## 2019-08-07 NOTE — Progress Notes (Signed)
Monique Figueroa  These are the results that I called you about. As I reported, I believe that it would be best for you to come to the ER at [facility] so that you can be closely followed for your critically low potassium level. I discussed this with Dr. Julien Nordmann who is the on-call MD for our practice today. He is in agreement with that recommendation. I know that you said that you did not want to do that out of concern that you would likely be admitted. In that case, as I said, you are to decrease your Lasix to 20 mg daily and increase your potassium to 80 milliequivalent (8 of your 10 milliequivalent tablets) daily for today, tomorrow and Monday. You also need to return on Monday for labs. You need to call 911 or come to the ER if you notice any changes in the way that your heart is beating. This is serious and can be very dangerous and even life threatening if not managed correctly. Someone will call you on Monday with your lab appointment time. Please go directly to the ER if you change your mind about being seen there,  Regards, Sandi Mealy, MHS, PA-C Physician Assistant

## 2019-08-07 NOTE — Addendum Note (Signed)
Addended by: Harle Stanford on: 08/07/2019 08:45 AM   Modules accepted: Orders

## 2019-08-07 NOTE — Progress Notes (Addendum)
[facility] Progress Note   Monique Figueroa [id number] 01-02-1934 86 y.o.  Monique Figueroa is managed by Dr. Nicholas Lose  Actively treated with chemotherapy/immunotherapy/hormonal therapy: yes  Current therapy:  Kadcyla   Last treated: 07/22/2019 (cycle 21, day 1)  Next scheduled appointment with provider: 08/12/2019  Assessment: Plan:    Port catheter in place - Plan: heparin lock flush 100 unit/mL, sodium chloride flush (NS) 0.9 % injection 10 mL  Metastatic breast cancer ([place]) - Plan: heparin lock flush 100 unit/mL, sodium chloride flush (NS) 0.9 % injection 10 mL  Hypokalemia  Peripheral edema   Metastatic breast cancer: The patient continues to be managed by  Dr. Nicholas Lose and is status post cycle 21, day 1 of Kadcyla which was dosed on 07/22/2019.  She is scheduled to be seen in follow-up on 08/12/2019.  Hypokalemia: A chemistry panel returned today with a potassium of 3.1.  The patient is currently on potassium chloride 10 mEq daily.  She was told to increase this to 40 mEq daily until her return on 08/07/2019 at which time a repeat chemistry panel will be collected.  Peripheral edema: A chemistry panel returned today with an albumin of 2.3.  This was reviewed with the patient and her son.  Her last echocardiogram from 07/07/2019 returned with an EF of 65 to 70%.  She was told to increase her Lasix to 40 mg once daily until her return on 08/07/2019.  A BNP will be collected on her return.  A repeat echocardiogram will be ordered.  Please see After Visit Summary for patient specific instructions.  Future Appointments  Date Time Provider [place]  08/07/2019 10:00 AM Sandi Mealy E., PA-C [facility] None  08/12/2019  1:00 PM [facility] LAB 5 [facility] None  08/12/2019  1:15 PM [facility] [place] FLUSH [facility] None  08/12/2019  1:30 PM Gardenia Phlegm, NP [facility] None  08/12/2019  2:30 PM [facility] INFUSION [facility] None    09/02/2019 12:15 PM [facility] LAB 1 [facility] None  09/02/2019 12:30 PM [facility] [place] FLUSH [facility] None  09/02/2019  1:30 PM [facility] INFUSION [facility] None  09/22/2019  2:30 PM Bruning, Ashlyn, PA-C [facility] None  09/23/2019 11:00 AM [facility] LAB 4 [facility] None  09/23/2019 11:15 AM [facility] [place] FLUSH [facility] None  09/23/2019 11:45 AM Nicholas Lose, MD [facility] None  09/23/2019 12:30 PM [facility] INFUSION [facility] None    No orders of the defined types were placed in this encounter.      Subjective:   Patient ID:  Monique Figueroa is a 86 y.o. (DOB 09-16-1933) female.  Chief Complaint:  Chief Complaint  Patient presents with  . Fatigue    HPI Monique Figueroa  Is a 86 y.o. female with a diagnosis of a metastatic breast cancer with innumerable liver metastases. She is managed by Dr. [name] and is currently treated with Kadcyla.  She most recently received cycle 21, day 1 on 07/22/2019.  She presents to the clinic today with increased weight and peripheral edema.  She weighed 156 pounds on 06/30/2019 and weighs 182 pounds today.  An echocardiogram was completed most recently on 07/07/2019 and returned with an EF calculated between 65 and 70%.  She had a minor fall at home when she slid out of her wheelchair and landed on her right side.  She had no loss of consciousness and no head injury.  She reports fatigue, increasing shortness of breath and dyspnea on exertion as well as a dark urine  with increased frequency and a foul odor.  She denies dizziness, weakness, fevers, chills, or sweats.  She is eating well.  Medications: I have reviewed the patient's current medications.  Allergies:  Allergies  Allergen Reactions  . Perjeta [Pertuzumab] Shortness Of Breath and Palpitations    Turned red   . Sulfa Antibiotics Swelling    Past Medical History:  Diagnosis Date  . Anemia   . Appendicitis   . Arthritis   . Biliary dyskinesia 03/24/2018  . Brain  metastases ([place])   . Breast cancer ([place]) 06/2006   Invasive ductal and DCIS of left breast  . Breast tumor    History of bilateral breast tumors/cysts  . Cholecystitis   . Cyst of spinal meninges   . Diabetes mellitus without complication ([place])   . GERD (gastroesophageal reflux disease)   . Hypertension   . Hypothyroidism   . Metastatic breast cancer ([place]) 04/26/2016   mets to liver  . Thyroid cancer ([place])   . Tuberculosis    medullary carcinoma ; denies TB   . UTI (urinary tract infection)    hx    Past Surgical History:  Procedure Laterality Date  . APPENDECTOMY    . BALLOON DILATION N/A 04/20/2018   Procedure: BALLOON DILATION;  Surgeon: Rush Landmark Telford Nab., MD;  Location: [facility];  Service: Gastroenterology;  Laterality: N/A;  . BILIARY DILATION  10/14/2018   Procedure: BILIARY DILATION;  Surgeon: Rush Landmark Telford Nab., MD;  Location: [facility];  Service: Gastroenterology;;  . BILIARY STENT PLACEMENT  04/20/2018   Procedure: BILIARY STENT PLACEMENT;  Surgeon: Irving Copas., MD;  Location: [facility];  Service: Gastroenterology;;  . BILIARY STENT PLACEMENT N/A 10/14/2018   Procedure: BILIARY STENT PLACEMENT;  Surgeon: Irving Copas., MD;  Location: [facility];  Service: Gastroenterology;  Laterality: N/A;  . BIOPSY  10/14/2018   Procedure: BIOPSY;  Surgeon: Rush Landmark Telford Nab., MD;  Location: [facility];  Service: Gastroenterology;;  . BREAST CYST EXCISION Bilateral    Several asprirations and excisions  . BREAST LUMPECTOMY WITH AXILLARY LYMPH NODE BIOPSY Left 06/19/2006   Invasive ductal and in situ carcinoma, node negative  . CATARACT EXTRACTION, BILATERAL  2009  . CHOLECYSTECTOMY N/A 03/24/2018   Procedure: DIAGNOSTIC LAPAROSCOPY;  Surgeon: Fanny Skates, MD;  Location: [facility];  Service: General;  Laterality: N/A;  . ENDOSCOPIC RETROGRADE CHOLANGIOPANCREATOGRAPHY (ERCP) WITH PROPOFOL N/A 04/20/2018   Procedure: ENDOSCOPIC RETROGRADE  CHOLANGIOPANCREATOGRAPHY (ERCP) WITH PROPOFOL ;  Surgeon: Irving Copas., MD;  Location: [facility];  Service: Gastroenterology;  Laterality: N/A;  . ENDOSCOPIC RETROGRADE CHOLANGIOPANCREATOGRAPHY (ERCP) WITH PROPOFOL N/A 10/14/2018   Procedure: ENDOSCOPIC RETROGRADE CHOLANGIOPANCREATOGRAPHY (ERCP) WITH PROPOFOL;  Surgeon: Rush Landmark Telford Nab., MD;  Location: [facility];  Service: Gastroenterology;  Laterality: N/A;  2 hour case  . PORTACATH PLACEMENT N/A 06/05/2016   Procedure: INSERTION PORT-A-CATH WITH [place];  Surgeon: Fanny Skates, MD;  Location: [facility];  Service: General;  Laterality: N/A;  . REMOVAL OF STONES  10/14/2018   Procedure: REMOVAL OF STONES;  Surgeon: Irving Copas., MD;  Location: [facility];  Service: Gastroenterology;;  . Joan Mayans  04/20/2018   Procedure: Joan Mayans;  Surgeon: Irving Copas., MD;  Location: [facility];  Service: Gastroenterology;;  . Dent    . STENT REMOVAL  10/14/2018   Procedure: STENT REMOVAL;  Surgeon: Irving Copas., MD;  Location: [facility];  Service: Gastroenterology;;  . TOTAL THYROIDECTOMY      Family History  Problem Relation Age of  Onset  . Rheum arthritis Mother   . Coronary artery disease Mother   . Dementia Mother   . Diabetes Mother   . Heart Problems Father   . Cancer Sister        Breast cancer  . Cancer Brother        Lung cancer  . Diabetes Sister   . Dementia Sister   . Colon cancer Neg Hx   . Esophageal cancer Neg Hx   . Inflammatory bowel disease Neg Hx   . Liver disease Neg Hx   . Pancreatic cancer Neg Hx   . Rectal cancer Neg Hx   . Stomach cancer Neg Hx     Social History   Socioeconomic History  . Marital status: Widowed    Spouse name: Not on file  . Number of children: 1  . Years of education: Not on file  . Highest education level: Not on file  Occupational History  . Not on file  Tobacco Use  . Smoking status: Never Smoker   . Smokeless tobacco: Never Used  Substance and Sexual Activity  . Alcohol use: No  . Drug use: No  . Sexual activity: Not Currently    Birth control/protection: Post-menopausal  Other Topics Concern  . Not on file  Social History Narrative  . Not on file   Social Determinants of Health   Financial Resource Strain:   . Difficulty of Paying Living Expenses: Not on file  Food Insecurity:   . Worried About Charity fundraiser in the Last Year: Not on file  . Ran Out of Food in the Last Year: Not on file  Transportation Needs:   . Lack of Transportation (Medical): Not on file  . Lack of Transportation (Non-Medical): Not on file  Physical Activity:   . Days of Exercise per Week: Not on file  . Minutes of Exercise per Session: Not on file  Stress:   . Feeling of Stress : Not on file  Social Connections:   . Frequency of Communication with Friends and Family: Not on file  . Frequency of Social Gatherings with Friends and Family: Not on file  . Attends Religious Services: Not on file  . Active Member of Clubs or Organizations: Not on file  . Attends Archivist Meetings: Not on file  . Marital Status: Not on file  Intimate Partner Violence:   . Fear of Current or Ex-Partner: Not on file  . Emotionally Abused: Not on file  . Physically Abused: Not on file  . Sexually Abused: Not on file    Past Medical History, Surgical history, Social history, and Family history were reviewed and updated as appropriate.   Please see review of systems for further details on the patient's review from today.   Review of Systems:  Review of Systems  Constitutional: Negative for chills, diaphoresis and fever.  HENT: Negative for trouble swallowing and voice change.   Respiratory: Positive for shortness of breath. Negative for cough, choking, chest tightness, wheezing and stridor.        Dyspnea on exertion  Cardiovascular: Positive for leg swelling. Negative for chest pain and  palpitations.  Gastrointestinal: Negative for abdominal pain, constipation, diarrhea, nausea and vomiting.  Genitourinary: Positive for frequency. Negative for difficulty urinating.       Foul-smelling urine  Musculoskeletal: Negative for back pain and myalgias.  Neurological: Negative for dizziness, light-headedness and headaches.    Objective:   Physical Exam:  BP 132/74 (BP Location:  Right Arm, Patient Position: Sitting)   Pulse 96   Temp 98.3 F (36.8 C) (Temporal)   Resp 17   Ht 5\' 5"  (1.651 m)   Wt 182 lb (82.6 kg)   SpO2 97%   BMI 30.29 kg/m  ECOG: 1  Physical Exam Constitutional:      General: She is not in acute distress.    Appearance: She is not diaphoretic.  HENT:     Head: Normocephalic and atraumatic.  Cardiovascular:     Rate and Rhythm: Normal rate and regular rhythm.     Heart sounds: Normal heart sounds. No murmur. No friction rub. No gallop.   Pulmonary:     Effort: Pulmonary effort is normal. No respiratory distress.     Breath sounds: Normal breath sounds. No wheezing or rales.  Abdominal:     General: There is distension.     Tenderness: There is no abdominal tenderness. There is no guarding.  Musculoskeletal:     Right lower leg: Edema present.     Left lower leg: Edema present.     Comments: 2+ pitting edema to the distal thighs bilaterally.  Skin:    General: Skin is warm and dry.     Findings: No erythema or rash.  Neurological:     Mental Status: She is alert.     Gait: Gait abnormal.     Comments: The patient is ambulating with the use of a wheelchair.     Lab Review:     Component Value Date/Time   NA 136 08/04/2019 1108   NA 139 08/07/2017 0905   K 3.1 (L) 08/04/2019 1108   K 3.7 08/07/2017 0905   CL 100 08/04/2019 1108   CL 104 06/09/2012 1402   CO2 24 08/04/2019 1108   CO2 27 08/07/2017 0905   GLUCOSE 104 (H) 08/04/2019 1108   GLUCOSE 102 08/07/2017 0905   GLUCOSE 153 (H) 06/09/2012 1402   BUN 10 08/04/2019 1108   BUN  14.7 08/07/2017 0905   CREATININE 0.63 08/04/2019 1108   CREATININE 0.8 08/07/2017 0905   CALCIUM 7.9 (L) 08/04/2019 1108   CALCIUM 9.3 08/07/2017 0905   PROT 6.4 (L) 08/04/2019 1108   PROT 7.3 08/07/2017 0905   ALBUMIN 2.3 (L) 08/04/2019 1108   ALBUMIN 3.9 08/07/2017 0905   AST 38 08/04/2019 1108   AST 22 08/07/2017 0905   ALT 10 08/04/2019 1108   ALT 14 08/07/2017 0905   ALKPHOS 79 08/04/2019 1108   ALKPHOS 68 08/07/2017 0905   BILITOT 1.3 (H) 08/04/2019 1108   BILITOT 0.56 08/07/2017 0905   GFRNONAA >60 08/04/2019 1108   GFRAA >60 08/04/2019 1108       Component Value Date/Time   WBC 6.3 08/04/2019 1108   WBC 6.2 08/03/2018 0551   RBC 3.80 (L) 08/04/2019 1108   HGB 11.4 (L) 08/04/2019 1108   HGB 12.5 08/07/2017 0905   HCT 33.8 (L) 08/04/2019 1108   HCT 37.3 08/07/2017 0905   PLT 158 08/04/2019 1108   PLT 195 08/07/2017 0905   MCV 88.9 08/04/2019 1108   MCV 93.6 08/07/2017 0905   MCH 30.0 08/04/2019 1108   MCHC 33.7 08/04/2019 1108   RDW 17.2 (H) 08/04/2019 1108   RDW 13.2 08/07/2017 0905   LYMPHSABS 0.5 (L) 08/04/2019 1108   LYMPHSABS 1.1 08/07/2017 0905   MONOABS 0.9 08/04/2019 1108   MONOABS 0.4 08/07/2017 0905   EOSABS 0.1 08/04/2019 1108   EOSABS 0.1 08/07/2017 0905   BASOSABS 0.1  08/04/2019 1108   BASOSABS 0.0 08/07/2017 0905   -------------------------------  Imaging from last 24 hours (if applicable):  Radiology interpretation: No results found.      This case was discussed with Dr. [name]. He expresses agreement with my management of this patient.

## 2019-08-09 ENCOUNTER — Inpatient Hospital Stay: Payer: Medicare Other

## 2019-08-09 ENCOUNTER — Other Ambulatory Visit: Payer: Self-pay

## 2019-08-09 DIAGNOSIS — C50919 Malignant neoplasm of unspecified site of unspecified female breast: Secondary | ICD-10-CM

## 2019-08-09 DIAGNOSIS — Z95828 Presence of other vascular implants and grafts: Secondary | ICD-10-CM

## 2019-08-09 LAB — CMP (CANCER CENTER ONLY)
ALT: 14 U/L (ref 0–44)
AST: 36 U/L (ref 15–41)
Albumin: 2.4 g/dL — ABNORMAL LOW (ref 3.5–5.0)
Alkaline Phosphatase: 74 U/L (ref 38–126)
Anion gap: 11 (ref 5–15)
BUN: 12 mg/dL (ref 8–23)
CO2: 28 mmol/L (ref 22–32)
Calcium: 7.4 mg/dL — ABNORMAL LOW (ref 8.9–10.3)
Chloride: 96 mmol/L — ABNORMAL LOW (ref 98–111)
Creatinine: 0.72 mg/dL (ref 0.44–1.00)
GFR, Est AFR Am: >60 mL/min (ref >60)
GFR, Estimated: >60 mL/min (ref >60)
Glucose, Bld: 147 mg/dL — ABNORMAL HIGH (ref 70–99)
Potassium: 2.9 mmol/L — CL (ref 3.5–5.1)
Sodium: 135 mmol/L (ref 135–145)
Total Bilirubin: 1.8 mg/dL — ABNORMAL HIGH (ref 0.3–1.2)
Total Protein: 6.2 g/dL — ABNORMAL LOW (ref 6.5–8.1)

## 2019-08-09 LAB — CBC WITH DIFFERENTIAL (CANCER CENTER ONLY)
Abs Immature Granulocytes: 0.02 10*3/uL (ref 0.00–0.07)
Basophils Absolute: 0.1 10*3/uL (ref 0.0–0.1)
Basophils Relative: 1 %
Eosinophils Absolute: 0.1 10*3/uL (ref 0.0–0.5)
Eosinophils Relative: 2 %
HCT: 33.8 % — ABNORMAL LOW (ref 36.0–46.0)
Hemoglobin: 11.4 g/dL — ABNORMAL LOW (ref 12.0–15.0)
Immature Granulocytes: 0 %
Lymphocytes Relative: 11 %
Lymphs Abs: 0.6 10*3/uL — ABNORMAL LOW (ref 0.7–4.0)
MCH: 29.9 pg (ref 26.0–34.0)
MCHC: 33.7 g/dL (ref 30.0–36.0)
MCV: 88.7 fL (ref 80.0–100.0)
Monocytes Absolute: 0.9 10*3/uL (ref 0.1–1.0)
Monocytes Relative: 15 %
Neutro Abs: 4.2 10*3/uL (ref 1.7–7.7)
Neutrophils Relative %: 71 %
Platelet Count: 192 10*3/uL (ref 150–400)
RBC: 3.81 MIL/uL — ABNORMAL LOW (ref 3.87–5.11)
RDW: 17.6 % — ABNORMAL HIGH (ref 11.5–15.5)
WBC Count: 5.9 10*3/uL (ref 4.0–10.5)
nRBC: 0 % (ref 0.0–0.2)

## 2019-08-09 MED ORDER — HEPARIN SOD (PORK) LOCK FLUSH 100 UNIT/ML IV SOLN
500.0000 [IU] | Freq: Once | INTRAVENOUS | Status: AC | PRN
  Administered 2019-08-09: 500 [IU] via INTRAVENOUS
  Filled 2019-08-09: qty 5

## 2019-08-09 MED ORDER — SODIUM CHLORIDE 0.9% FLUSH
10.0000 mL | INTRAVENOUS | Status: DC | PRN
  Administered 2019-08-09: 15:00:00 10 mL via INTRAVENOUS
  Filled 2019-08-09: qty 10

## 2019-08-09 NOTE — Patient Instructions (Signed)

## 2019-08-12 ENCOUNTER — Ambulatory Visit (HOSPITAL_COMMUNITY)
Admission: RE | Admit: 2019-08-12 | Discharge: 2019-08-12 | Disposition: A | Discharge status: Home / Self Care | Payer: Medicare Other | Source: Ambulatory Visit | Attending: Medical | Admitting: Medical

## 2019-08-12 ENCOUNTER — Inpatient Hospital Stay (HOSPITAL_BASED_OUTPATIENT_CLINIC_OR_DEPARTMENT_OTHER): Payer: Medicare Other | Admitting: Adult Health

## 2019-08-12 ENCOUNTER — Other Ambulatory Visit: Payer: Self-pay

## 2019-08-12 ENCOUNTER — Inpatient Hospital Stay: Payer: Medicare Other

## 2019-08-12 ENCOUNTER — Encounter: Payer: Self-pay | Admitting: Adult Health

## 2019-08-12 VITALS — BP 142/71 | HR 94 | Temp 98.1°F | Resp 18 | Wt 173.4 lb

## 2019-08-12 DIAGNOSIS — C50919 Malignant neoplasm of unspecified site of unspecified female breast: Secondary | ICD-10-CM | POA: Insufficient documentation

## 2019-08-12 DIAGNOSIS — I10 Essential (primary) hypertension: Secondary | ICD-10-CM | POA: Diagnosis not present

## 2019-08-12 DIAGNOSIS — I351 Nonrheumatic aortic (valve) insufficiency: Secondary | ICD-10-CM | POA: Diagnosis not present

## 2019-08-12 DIAGNOSIS — R609 Edema, unspecified: Secondary | ICD-10-CM | POA: Insufficient documentation

## 2019-08-12 DIAGNOSIS — Z95828 Presence of other vascular implants and grafts: Secondary | ICD-10-CM

## 2019-08-12 DIAGNOSIS — E119 Type 2 diabetes mellitus without complications: Secondary | ICD-10-CM | POA: Insufficient documentation

## 2019-08-12 DIAGNOSIS — C787 Secondary malignant neoplasm of liver and intrahepatic bile duct: Secondary | ICD-10-CM

## 2019-08-12 LAB — CMP (CANCER CENTER ONLY)
ALT: 10 U/L (ref 0–44)
AST: 34 U/L (ref 15–41)
Albumin: 2.5 g/dL — ABNORMAL LOW (ref 3.5–5.0)
Alkaline Phosphatase: 93 U/L (ref 38–126)
Anion gap: 10 (ref 5–15)
BUN: 12 mg/dL (ref 8–23)
CO2: 26 mmol/L (ref 22–32)
Calcium: 7.3 mg/dL — ABNORMAL LOW (ref 8.9–10.3)
Chloride: 99 mmol/L (ref 98–111)
Creatinine: 0.7 mg/dL (ref 0.44–1.00)
GFR, Est AFR Am: >60 mL/min (ref >60)
GFR, Estimated: >60 mL/min (ref >60)
Glucose, Bld: 105 mg/dL — ABNORMAL HIGH (ref 70–99)
Potassium: 3.1 mmol/L — ABNORMAL LOW (ref 3.5–5.1)
Sodium: 135 mmol/L (ref 135–145)
Total Bilirubin: 1.8 mg/dL — ABNORMAL HIGH (ref 0.3–1.2)
Total Protein: 6.9 g/dL (ref 6.5–8.1)

## 2019-08-12 LAB — CBC WITH DIFFERENTIAL (CANCER CENTER ONLY)
Abs Immature Granulocytes: 0.01 10*3/uL (ref 0.00–0.07)
Basophils Absolute: 0.1 10*3/uL (ref 0.0–0.1)
Basophils Relative: 1 %
Eosinophils Absolute: 0.1 10*3/uL (ref 0.0–0.5)
Eosinophils Relative: 1 %
HCT: 35 % — ABNORMAL LOW (ref 36.0–46.0)
Hemoglobin: 11.8 g/dL — ABNORMAL LOW (ref 12.0–15.0)
Immature Granulocytes: 0 %
Lymphocytes Relative: 8 %
Lymphs Abs: 0.6 10*3/uL — ABNORMAL LOW (ref 0.7–4.0)
MCH: 29.9 pg (ref 26.0–34.0)
MCHC: 33.7 g/dL (ref 30.0–36.0)
MCV: 88.6 fL (ref 80.0–100.0)
Monocytes Absolute: 0.9 10*3/uL (ref 0.1–1.0)
Monocytes Relative: 13 %
Neutro Abs: 5.4 10*3/uL (ref 1.7–7.7)
Neutrophils Relative %: 77 %
Platelet Count: 173 10*3/uL (ref 150–400)
RBC: 3.95 MIL/uL (ref 3.87–5.11)
RDW: 18.1 % — ABNORMAL HIGH (ref 11.5–15.5)
WBC Count: 7 10*3/uL (ref 4.0–10.5)
nRBC: 0 % (ref 0.0–0.2)

## 2019-08-12 MED ORDER — POTASSIUM CHLORIDE ER 10 MEQ PO CPCR: 40.0000 meq | ORAL_CAPSULE | Freq: Two times a day (BID) | ORAL | 0 refills | Status: AC

## 2019-08-12 MED ORDER — ACETAMINOPHEN 325 MG PO TABS
650.0000 mg | ORAL_TABLET | Freq: Once | ORAL | Status: AC
  Administered 2019-08-12: 650 mg via ORAL

## 2019-08-12 MED ORDER — SODIUM CHLORIDE 0.9 % IV SOLN
3.0000 mg/kg | Freq: Once | INTRAVENOUS | Status: AC
  Administered 2019-08-12: 200 mg via INTRAVENOUS
  Filled 2019-08-12: qty 10

## 2019-08-12 MED ORDER — HEPARIN SOD (PORK) LOCK FLUSH 100 UNIT/ML IV SOLN
500.0000 [IU] | Freq: Once | INTRAVENOUS | Status: AC | PRN
  Administered 2019-08-12: 18:00:00 500 [IU]
  Filled 2019-08-12: qty 5

## 2019-08-12 MED ORDER — SODIUM CHLORIDE 0.9% FLUSH
10.0000 mL | INTRAVENOUS | Status: DC | PRN
  Administered 2019-08-12: 10 mL via INTRAVENOUS
  Filled 2019-08-12: qty 10

## 2019-08-12 MED ORDER — ACETAMINOPHEN 325 MG PO TABS
ORAL_TABLET | ORAL | Status: AC
  Filled 2019-08-12: qty 2

## 2019-08-12 MED ORDER — SODIUM CHLORIDE 0.9 % IV SOLN
Freq: Once | INTRAVENOUS | Status: AC
  Filled 2019-08-12: qty 250

## 2019-08-12 MED ORDER — DIPHENHYDRAMINE HCL 25 MG PO CAPS
50.0000 mg | ORAL_CAPSULE | Freq: Once | ORAL | Status: AC
  Administered 2019-08-12: 50 mg via ORAL

## 2019-08-12 MED ORDER — SODIUM CHLORIDE 0.9% FLUSH
10.0000 mL | INTRAVENOUS | Status: DC | PRN
  Administered 2019-08-12: 10 mL
  Filled 2019-08-12: qty 10

## 2019-08-12 MED ORDER — DIPHENHYDRAMINE HCL 25 MG PO CAPS
ORAL_CAPSULE | ORAL | Status: AC
  Filled 2019-08-12: qty 2

## 2019-08-12 NOTE — Progress Notes (Signed)
  Echocardiogram 2D Echocardiogram has been performed.  Monique Figueroa 08/12/2019, 12:00 PM

## 2019-08-12 NOTE — Progress Notes (Signed)
Continue Kadcyla at 200 mg today despite wt change (edema) per Wilber Bihari, NP.  [place] [place], Pharm.D., CPP 08/12/2019@4 :13 PM

## 2019-08-12 NOTE — Assessment & Plan Note (Signed)
Metastatic breast cancer with innumerable liver metastases detected on ultrasound and recent MRI of the liver on 04/26/2016 (Prior history of left breast IDC T1 1 N0 stage IA 0.3 cm grade 1 tumor that was ER 38%, PR 93%, Ki-67 10%, HER-2 2+, no tissue for FISH testing, 0/1 lymph node negative, status post lumpectomy radiation and 5 years of Arimidex completed in February 2013)  PET/CT scan: 05/07/2016: Extensive metastatic involvement of both lobes of the liver, primary could be liver, phalangeal or metastatic disease, hypermetabolic thoracic lymph nodes left axillary, left supraclavicular and right CP angle lymph nodes, hypermetabolic upper abdominal lymph nodes  Liver biopsy10/07/2016: Metastatic carcinoma breast primary strong positivity for CK 7 weak focal positivity for ER, GCDFP, negative for PR, CDX 2, TTF-1, WT 1; HER-2 positive  Treatment plan: 1. Taxotere Herceptin and Perjeta every 3 weeks palliative chemotherapy started 06/13/16 (Perjeta discontinued after 5 cycles due to allergy) 2. followed by Herceptin maintenance stopped 05/08/2018 due to progression, Kadcyla started --------------------------------------------------------------------------------------------------------------------------------------- 10/14/2018: ERCP with stent placement  Current treatment: Kadcyla starting 05/29/2018 Patient completed palliative radiation therapy to the brain Echo 07/07/2019 shows EF of 60-65%  Bilirubin is slightly elevated today at 1.8.  She will proceed with current dose reduction and we will monitor, as she received this treatment 3 weeks ago with her bili around the same range at 1.6.    Her fluid overload is not related to her heart.  I ordered repeat scans, as worsening liver metastases can present as decreased appetite and anasarca.  We talked about other etiologies of edema and I recommended that she increase her protein intake.  She is down about 9 pounds, and will continue to take  lasix with the potassium BID.  I sent in more of this.  She will take Potassium 25mq po BID x 4 days, then go down to 20 meq BID.  I wrote this down for them in her AVS.  I also gave them information about potassium intake.    Monique Figueroa will undergo scans and return in one week for labs and f/u with Dr. GLindi Adie  She was recommended to continue with the appropriate pandemic precautions. She knows to call for any questions that may arise between now and her next appointment.  We are happy to see her sooner if needed.

## 2019-08-12 NOTE — Progress Notes (Signed)
Monique Figueroa Cancer Follow up:    [name], [name] [name] [address]   DIAGNOSIS: Cancer Staging No matching staging information was found for the patient.  SUMMARY OF ONCOLOGIC HISTORY: Oncology History  Metastatic breast cancer ([place])  06/05/2006 Initial Diagnosis   Left breast biopsy: DCIS with apocrine features ER 30%, PR 13%   06/19/2006 Surgery   Left lumpectomy: stage I, pT1a, pN0(i)(sn), pMX, 0.3 cm IDC, grade 1, with extensive high-grade DCIS , margins negative ER 38 %, PR 93 %, Ki-67 10%, HER-2/neu 2+, no additional tissue available for FISH testing, with 0/1 left axillary lymph nodes.   07/30/2006 - 09/17/2006 Radiation Therapy   Adjuvant radiation therapy   09/24/2006 - 09/25/2011 Anti-estrogen oral therapy   Antiestrogen therapy with Arimidex 5 years   04/26/2016 Relapse/Recurrence   Innumerable hepatic lesions seen in both lobes most are 1-2 cm size somewhat confluent largest in the posterior right hepatic dome 3 x 4.6 cm   05/07/2016 PET scan   PET/CT scan: Extensive metastatic involvement of both lobes of the liver, primary could be liver, phalangeal or metastatic disease, hypermetabolic thoracic LN left axillary, left supraclavicular and right CP angle LN, hypermetabolic upper abdominal LN   05/16/2016 Initial Biopsy   Liver biopsy: Metastatic carcinoma breast primary strong positivity for CK 7 weak focal positivity for ER, GCDFP, negative for PR, CDX 2, TTF-1, WT 1; HER-2 positive   06/13/2016 - 11/07/2016 Chemotherapy   Taxotere Herceptin and Perjeta every 3 weeks (Taxotere was held for elevated LFTs for cycles 1 and 2)   11/28/2016 - 05/06/2018 Chemotherapy   Herceptin maintenance therapy for metastatic breast cancer every 3 weeks   03/24/2018 Surgery   Chronic cholecystitis: Diagnostic laparoscopy: Due to intermittent attacks of biliary colic: The gallbladder was rockhard and not distensible with tumor palpated in the porta  hepatis, extensive lucent liver lesions, decision was made to stop the surgery.   05/12/2018 - 05/22/2018 Radiation Therapy   Radiation to the brain   05/29/2018 -  Chemotherapy   Kadcyla     CURRENT THERAPY: Kadcyla  INTERVAL HISTORY: Monique Figueroa 86 y.o. female returns for evaluation prior to receiving Kadcyla. She is accompanied by her son, [name], who travels every three weeks from [place], [place], to come with his mom to her appointments.  [name] has had a progressive decline since around Thanksgiving. She has had a thirty pound weight gain, accompanied by a decreased appetite.  She was given lasix and subsequently struggled with hypokalemia.  She has worsening weakness, but right now is able to walk around at home and do for herself as needed.  Her weight on 11/25 was 156 and on 12/30 was 182.  She is now down to 173 today.  She has been taking potassium BID and is tolerating this well.  She doesn't eat a lot of proteins.  At her home, she has her son who visits her twice a day, and a cousin who helps.  Otherwise, she lives alone and says that she manages on her home.  She underwent echo earlier today.     Patient Active Problem List   Diagnosis Date Noted  . Common bile duct (CBD) stricture 08/18/2018  . RUQ pain 08/18/2018  . History of ERCP 08/18/2018  . Brain metastases ([place]) 05/08/2018  . Bile duct obstruction 04/20/2018  . Biliary dyskinesia 03/24/2018  . Tuberculosis   . Port catheter in place 04/03/2017  . Infusion reaction 06/13/2016  .  Metastases to the liver (Monique Figueroa) 05/08/2016  . Metastatic breast cancer (Monique Figueroa) 06/23/2013    is allergic to perjeta [pertuzumab] and sulfa antibiotics.  MEDICAL HISTORY: Past Medical History:  Diagnosis Date  . Anemia   . Appendicitis   . Arthritis   . Biliary dyskinesia 03/24/2018  . Brain metastases (Monique Figueroa)   . Breast cancer ([place]) 06/2006   Invasive ductal and DCIS of left breast  . Breast tumor    History of bilateral  breast tumors/cysts  . Cholecystitis   . Cyst of spinal meninges   . Diabetes mellitus without complication (Monique Figueroa)   . GERD (gastroesophageal reflux disease)   . Hypertension   . Hypothyroidism   . Metastatic breast cancer (Monique Figueroa) 04/26/2016   mets to liver  . Thyroid cancer (Monique Figueroa)   . Tuberculosis    medullary carcinoma ; denies TB   . UTI (urinary tract infection)    hx    SURGICAL HISTORY: Past Surgical History:  Procedure Laterality Date  . APPENDECTOMY    . BALLOON DILATION N/A 04/20/2018   Procedure: BALLOON DILATION;  Surgeon: Rush Landmark Telford Nab., MD;  Location: [facility];  Service: Gastroenterology;  Laterality: N/A;  . BILIARY DILATION  10/14/2018   Procedure: BILIARY DILATION;  Surgeon: Rush Landmark Telford Nab., MD;  Location: [facility];  Service: Gastroenterology;;  . BILIARY STENT PLACEMENT  04/20/2018   Procedure: BILIARY STENT PLACEMENT;  Surgeon: Irving Copas., MD;  Location: [facility];  Service: Gastroenterology;;  . BILIARY STENT PLACEMENT N/A 10/14/2018   Procedure: BILIARY STENT PLACEMENT;  Surgeon: Irving Copas., MD;  Location: [facility];  Service: Gastroenterology;  Laterality: N/A;  . BIOPSY  10/14/2018   Procedure: BIOPSY;  Surgeon: Rush Landmark Telford Nab., MD;  Location: [facility];  Service: Gastroenterology;;  . BREAST CYST EXCISION Bilateral    Several asprirations and excisions  . BREAST LUMPECTOMY WITH AXILLARY LYMPH NODE BIOPSY Left 06/19/2006   Invasive ductal and in situ carcinoma, node negative  . CATARACT EXTRACTION, BILATERAL  2009  . CHOLECYSTECTOMY N/A 03/24/2018   Procedure: DIAGNOSTIC LAPAROSCOPY;  Surgeon: Fanny Skates, MD;  Location: [facility];  Service: General;  Laterality: N/A;  . ENDOSCOPIC RETROGRADE CHOLANGIOPANCREATOGRAPHY (ERCP) WITH PROPOFOL N/A 04/20/2018   Procedure: ENDOSCOPIC RETROGRADE CHOLANGIOPANCREATOGRAPHY (ERCP) WITH PROPOFOL ;  Surgeon: Irving Copas., MD;  Location: [facility];   Service: Gastroenterology;  Laterality: N/A;  . ENDOSCOPIC RETROGRADE CHOLANGIOPANCREATOGRAPHY (ERCP) WITH PROPOFOL N/A 10/14/2018   Procedure: ENDOSCOPIC RETROGRADE CHOLANGIOPANCREATOGRAPHY (ERCP) WITH PROPOFOL;  Surgeon: Rush Landmark Telford Nab., MD;  Location: [facility];  Service: Gastroenterology;  Laterality: N/A;  2 hour case  . PORTACATH PLACEMENT N/A 06/05/2016   Procedure: INSERTION PORT-A-CATH WITH [place];  Surgeon: Fanny Skates, MD;  Location: [facility];  Service: General;  Laterality: N/A;  . REMOVAL OF STONES  10/14/2018   Procedure: REMOVAL OF STONES;  Surgeon: Irving Copas., MD;  Location: [facility];  Service: Gastroenterology;;  . Joan Mayans  04/20/2018   Procedure: Joan Mayans;  Surgeon: Irving Copas., MD;  Location: [facility];  Service: Gastroenterology;;  . [place]    . STENT REMOVAL  10/14/2018   Procedure: STENT REMOVAL;  Surgeon: Irving Copas., MD;  Location: [facility];  Service: Gastroenterology;;  . TOTAL THYROIDECTOMY      SOCIAL HISTORY: Social History   Socioeconomic History  . Marital status: Widowed    Spouse name: Not on file  . Number of children: 1  . Years of education: Not on file  . Highest  education level: Not on file  Occupational History  . Not on file  Tobacco Use  . Smoking status: Never Smoker  . Smokeless tobacco: Never Used  Substance and Sexual Activity  . Alcohol use: No  . Drug use: No  . Sexual activity: Not Currently    Birth control/protection: Post-menopausal  Other Topics Concern  . Not on file  Social History Narrative  . Not on file   Social Determinants of Health   Financial Resource Strain:   . Difficulty of Paying Living Expenses: Not on file  Food Insecurity:   . Worried About Charity fundraiser in the Last Year: Not on file  . Ran Out of Food in the Last Year: Not on file  Transportation Needs:   . Lack of Transportation (Medical): Not on file  .  Lack of Transportation (Non-Medical): Not on file  Physical Activity:   . Days of Exercise per Week: Not on file  . Minutes of Exercise per Session: Not on file  Stress:   . Feeling of Stress : Not on file  Social Connections:   . Frequency of Communication with Friends and Family: Not on file  . Frequency of Social Gatherings with Friends and Family: Not on file  . Attends Religious Services: Not on file  . Active Member of Clubs or Organizations: Not on file  . Attends Archivist Meetings: Not on file  . Marital Status: Not on file  Intimate Partner Violence:   . Fear of Current or Ex-Partner: Not on file  . Emotionally Abused: Not on file  . Physically Abused: Not on file  . Sexually Abused: Not on file    FAMILY HISTORY: Family History  Problem Relation Age of Onset  . Rheum arthritis Mother   . Coronary artery disease Mother   . Dementia Mother   . Diabetes Mother   . Heart Problems Father   . Cancer Sister        Breast cancer  . Cancer Brother        Lung cancer  . Diabetes Sister   . Dementia Sister   . Colon cancer Neg Hx   . Esophageal cancer Neg Hx   . Inflammatory bowel disease Neg Hx   . Liver disease Neg Hx   . Pancreatic cancer Neg Hx   . Rectal cancer Neg Hx   . Stomach cancer Neg Hx     Review of Systems  Constitutional: Positive for appetite change, fatigue and unexpected weight change. Negative for chills and diaphoresis.  HENT:   Negative for hearing loss and lump/mass.   Eyes: Negative for eye problems and icterus.  Respiratory: Negative for chest tightness, cough and shortness of breath.   Cardiovascular: Positive for leg swelling. Negative for chest pain and palpitations.  Gastrointestinal: Positive for abdominal distention. Negative for abdominal pain, blood in stool, constipation, diarrhea, nausea and vomiting.  Endocrine: Negative for hot flashes.  Genitourinary: Negative for difficulty urinating.   Musculoskeletal: Negative for  arthralgias.  Skin: Negative for itching and rash.  Neurological: Negative for dizziness, extremity weakness, headaches and numbness.  Hematological: Negative for adenopathy. Does not bruise/bleed easily.  Psychiatric/Behavioral: Negative for depression. The patient is not nervous/anxious.       PHYSICAL EXAMINATION  ECOG PERFORMANCE STATUS: 2 - Symptomatic, <50% confined to bed  Vitals:   08/12/19 1326  BP: (!) 142/71  Pulse: 94  Resp: 18  Temp: 98.1 F (36.7 C)  SpO2: 95%  Physical Exam Constitutional:      Appearance: Normal appearance.  HENT:     Head: Normocephalic and atraumatic.     Mouth/Throat:     Comments: Mask in place Eyes:     General: No scleral icterus.    Pupils: Pupils are equal, round, and reactive to light.  Cardiovascular:     Rate and Rhythm: Normal rate and regular rhythm.     Pulses: Normal pulses.  Pulmonary:     Effort: Pulmonary effort is normal.     Breath sounds: Normal breath sounds.  Abdominal:     General: Abdomen is flat. There is distension (anasarca).     Palpations: Abdomen is soft.  Musculoskeletal:        General: Swelling present.     Cervical back: Neck supple.  Lymphadenopathy:     Cervical: No cervical adenopathy.  Skin:    General: Skin is warm and dry.     Capillary Refill: Capillary refill takes less than 2 seconds.     Findings: No rash.  Neurological:     General: No focal deficit present.     Mental Status: She is alert and oriented to person, place, and time.  Psychiatric:        Mood and Affect: Mood normal.        Behavior: Behavior normal.     LABORATORY DATA:  CBC    Component Value Date/Time   WBC 7.0 08/12/2019 1227   WBC 6.2 08/03/2018 0551   RBC 3.95 08/12/2019 1227   HGB 11.8 (L) 08/12/2019 1227   HGB 12.5 08/07/2017 0905   HCT 35.0 (L) 08/12/2019 1227   HCT 37.3 08/07/2017 0905   PLT 173 08/12/2019 1227   PLT 195 08/07/2017 0905   MCV 88.6 08/12/2019 1227   MCV 93.6 08/07/2017 0905    MCH 29.9 08/12/2019 1227   MCHC 33.7 08/12/2019 1227   RDW 18.1 (H) 08/12/2019 1227   RDW 13.2 08/07/2017 0905   LYMPHSABS 0.6 (L) 08/12/2019 1227   LYMPHSABS 1.1 08/07/2017 0905   MONOABS 0.9 08/12/2019 1227   MONOABS 0.4 08/07/2017 0905   EOSABS 0.1 08/12/2019 1227   EOSABS 0.1 08/07/2017 0905   BASOSABS 0.1 08/12/2019 1227   BASOSABS 0.0 08/07/2017 0905    CMP     Component Value Date/Time   NA 135 08/12/2019 1227   NA 139 08/07/2017 0905   K 3.1 (L) 08/12/2019 1227   K 3.7 08/07/2017 0905   CL 99 08/12/2019 1227   CL 104 06/09/2012 1402   CO2 26 08/12/2019 1227   CO2 27 08/07/2017 0905   GLUCOSE 105 (H) 08/12/2019 1227   GLUCOSE 102 08/07/2017 0905   GLUCOSE 153 (H) 06/09/2012 1402   BUN 12 08/12/2019 1227   BUN 14.7 08/07/2017 0905   CREATININE 0.70 08/12/2019 1227   CREATININE 0.8 08/07/2017 0905   CALCIUM 7.3 (L) 08/12/2019 1227   CALCIUM 9.3 08/07/2017 0905   PROT 6.9 08/12/2019 1227   PROT 7.3 08/07/2017 0905   ALBUMIN 2.5 (L) 08/12/2019 1227   ALBUMIN 3.9 08/07/2017 0905   AST 34 08/12/2019 1227   AST 22 08/07/2017 0905   ALT 10 08/12/2019 1227   ALT 14 08/07/2017 0905   ALKPHOS 93 08/12/2019 1227   ALKPHOS 68 08/07/2017 0905   BILITOT 1.8 (H) 08/12/2019 1227   BILITOT 0.56 08/07/2017 0905   GFRNONAA >60 08/12/2019 1227   GFRAA >60 08/12/2019 1227        ASSESSMENT and THERAPY  PLAN:   Metastatic breast cancer ([place]) Metastatic breast cancer with innumerable liver metastases detected on ultrasound and recent MRI of the liver on 04/26/2016 (Prior history of left breast IDC T1 1 N0 stage IA 0.3 cm grade 1 tumor that was ER 38%, PR 93%, Ki-67 10%, HER-2 2+, no tissue for FISH testing, 0/1 lymph node negative, status post lumpectomy radiation and 5 years of Arimidex completed in February 2013)  PET/CT scan: 05/07/2016: Extensive metastatic involvement of both lobes of the liver, primary could be liver, phalangeal or metastatic disease,  hypermetabolic thoracic lymph nodes left axillary, left supraclavicular and right CP angle lymph nodes, hypermetabolic upper abdominal lymph nodes  Liver biopsy10/07/2016: Metastatic carcinoma breast primary strong positivity for CK 7 weak focal positivity for ER, GCDFP, negative for PR, CDX 2, TTF-1, WT 1; HER-2 positive  Treatment plan: 1. Taxotere Herceptin and Perjeta every 3 weeks palliative chemotherapy started 06/13/16 (Perjeta discontinued after 5 cycles due to allergy) 2. followed by Herceptin maintenance stopped 05/08/2018 due to progression, Kadcyla started --------------------------------------------------------------------------------------------------------------------------------------- 10/14/2018: ERCP with stent placement  Current treatment: Kadcyla starting 05/29/2018 Patient completed palliative radiation therapy to the brain Echo 07/07/2019 shows EF of 60-65%  Bilirubin is slightly elevated today at 1.8.  She will proceed with current dose reduction and we will monitor, as she received this treatment 3 weeks ago with her bili around the same range at 1.6.    Her fluid overload is not related to her heart.  I ordered repeat scans, as worsening liver metastases can present as decreased appetite and anasarca.  We talked about other etiologies of edema and I recommended that she increase her protein intake.  She is down about 9 pounds, and will continue to take lasix with the potassium BID.  I sent in more of this.  She will take Potassium 66mq po BID x 4 days, then go down to 20 meq BID.  I wrote this down for them in her AVS.  I also gave them information about potassium intake.    Adiba will undergo scans and return in one week for labs and f/u with Dr. GLindi Adie  She was recommended to continue with the appropriate pandemic precautions. She knows to call for any questions that may arise between now and her next appointment.  We are happy to see her sooner if  needed.    Orders Placed This Encounter  Procedures  . CT Chest W Contrast    Standing Status:   Future    Standing Expiration Date:   08/11/2020    Order Specific Question:   If indicated for the ordered procedure, I authorize the administration of contrast media per Radiology protocol    Answer:   Yes    Order Specific Question:   [facility]    [facility]   Order Specific Question:   Radiology Contrast Protocol - do NOT remove file path    Answer:   [email or web address]  . CT Abdomen Pelvis W Contrast    Standing Status:   Future    Standing Expiration Date:   08/11/2020    Order Specific Question:   If indicated for the ordered procedure, I authorize the administration of contrast media per Radiology protocol    Answer:   Yes    Order Specific Question:   [facility]    [facility]   Order Specific Question:   Is Oral Contrast requested for this exam?  Answer:   Yes, Per Radiology protocol    Order Specific Question:   Radiology Contrast Protocol - do NOT remove file path    Answer:   [email or web address]   This note was electronically signed.  A total of (40) minutes of face-to-face time was spent with this patient with greater than 50% of that time in counseling and care-coordination.  Scot Dock, NP 08/12/2019

## 2019-08-12 NOTE — Patient Instructions (Addendum)
On Thursday, Friday, Saturday and Sunday, Take 4 tablets of the 87meq tablets of Potassium twice a day.  Then, on Monday, 08/15/2018 take 2 tablets of the 10 meq of potassium twice a day.    Potassium Content of Foods  Potassium is a mineral found in many foods and drinks. It affects how the heart works, and helps keep fluids and minerals balanced in the body. The amount of potassium you need each day depends on your age and any medical conditions you may have. Talk to your health care provider or dietitian about how much potassium you need. The following lists of foods provide the general serving size for foods and the approximate amount of potassium in each serving, listed in milligrams (mg). Actual values may vary depending on the product and how it is processed. High in potassium The following foods and beverages have 200 mg or more of potassium per serving:  Apricots (raw) - 2 have 200 mg of potassium.  Apricots (dry) - 5 have 200 mg of potassium.  Artichoke - 1 medium has 345 mg of potassium.  Avocado -  fruit has 245 mg of potassium.  Banana - 1 medium fruit has 425 mg of potassium.  [place] or baked beans (canned) -  cup has 280 mg of potassium.  White beans (canned) -  cup has 595 mg potassium.  Beef roast - 3 oz has 320 mg of potassium.  Ground beef - 3 oz has 270 mg of potassium.  Beets (raw or cooked) -  cup has 260 mg of potassium.  Bran muffin - 2 oz has 300 mg of potassium.  Broccoli (cooked) -  cup has 230 mg of potassium.  Brussels sprouts -  cup has 250 mg of potassium.  Cantaloupe -  cup has 215 mg of potassium.  Cereal, 100% bran -  cup has 200-400 mg of potassium.  Cheeseburger -1 single fast food burger has 225-400 mg of potassium.  Chicken - 3 oz has 220 mg of potassium.  Clams (canned) - 3 oz has 535 mg of potassium.  Crab - 3 oz has 225 mg of potassium.  Dates - 5 have 270 mg of potassium.  Dried beans and peas -  cup has 300-475 mg of  potassium.  Figs (dried) - 2 have 260 mg of potassium.  Fish (halibut, tuna, cod, snapper) - 3 oz has 480 mg of potassium.  Fish (salmon, haddock, swordfish, perch) - 3 oz has 300 mg of potassium.  Fish (tuna, canned) - 3 oz has 200 mg of potassium.  [place] fries (fast food) - 3 oz has 470 mg of potassium.  Granola with fruit and nuts -  cup has 200 mg of potassium.  Grapefruit juice -  cup has 200 mg of potassium.  Honeydew melon -  cup has 200 mg of potassium.  Kale (raw) - 1 cup has 300 mg of potassium.  Kiwi - 1 medium fruit has 240 mg of potassium.  Kohlrabi, rutabaga, parsnips -  cup has 280 mg of potassium.  Lentils -  cup has 365 mg of potassium.  Mango - 1 each has 325 mg of potassium.  Milk (nonfat, low-fat, whole, buttermilk) - 1 cup has 350-380 mg of potassium.  Milk (chocolate) - 1 cup has 420 mg of potassium  Molasses - 1 Tbsp has 295 mg of potassium.  Mushrooms -  cup has 280 mg of potassium.  Nectarine - 1 each has 275 mg of potassium.  Nuts (almonds,  peanuts, hazelnuts, [place], cashew, mixed) - 1 oz has 200 mg of potassium.  Nuts (pistachios) - 1 oz has 295 mg of potassium.  Orange - 1 fruit has 240 mg of potassium.  Orange juice -  cup has 235 mg of potassium.  Papaya -  medium fruit has 390 mg of potassium.  Peanut butter (chunky) - 2 Tbsp has 240 mg of potassium.  Peanut butter (smooth) - 2 Tbsp has 210 mg of potassium.  Pear - 1 medium (200 mg) of potassium.  Pomegranate - 1 whole fruit has 400 mg of potassium.  Pomegranate juice -  cup has 215 mg of potassium.  Pork - 3 oz has 350 mg of potassium.  Potato chips (salted) - 1 oz has 465 mg of potassium.  Potato (baked with skin) - 1 medium has 925 mg of potassium.  Potato (boiled) -  cup has 255 mg of potassium.  Potato (Mashed) -  cup has 330 mg of potassium.  Prune juice -  cup has 370 mg of potassium.  Prunes - 5 have 305 mg of potassium.  Pudding (chocolate) -   cup has 230 mg of potassium.  Pumpkin (canned) -  cup has 250 mg of potassium.  Raisins (seedless) -  cup has 270 mg of potassium.  Seeds (sunflower or pumpkin) - 1 oz has 240 mg of potassium.  Soy milk - 1 cup has 300 mg of potassium.  Spinach (cooked) - 1/2 cup has 420 mg of potassium.  Spinach (canned) -  cup has 370 mg of potassium.  Sweet potato (baked with skin) - 1 medium has 450 mg of potassium.  Swiss chard -  cup has 480 mg of potassium.  Tomato or vegetable juice -  cup has 275 mg of potassium.  Tomato (sauce or puree) -  cup has 400-550 mg of potassium.  Tomato (raw) - 1 medium has 290 mg of potassium.  Tomato (canned) -  cup has 200-300 mg of potassium.  [place] - 3 oz has 250 mg of potassium.  Wheat germ - 1 oz has 250 mg of potassium.  Winter squash -  cup has 250 mg of potassium.  Yogurt (plain or fruited) - 6 oz has 260-435 mg of potassium.  Zucchini -  cup has 220 mg of potassium. Moderate in potassium The following foods and beverages have 50-200 mg of potassium per serving:  Apple - 1 fruit has 150 mg of potassium  Apple juice -  cup has 150 mg of potassium  Applesauce -  cup has 90 mg of potassium  Apricot nectar -  cup has 140 mg of potassium  Asparagus (small spears) -  cup has 155 mg of potassium  Asparagus (large spears) - 6 have 155 mg of potassium  Bagel (cinnamon raisin) - 1 four-inch bagel has 130 mg of potassium  Bagel (egg or plain) - 1 four- inch bagel has 70 mg of potassium  Beans (green) -  cup has 90 mg of potassium  Beans (yellow) -  cup has 190 mg of potassium  Beer, regular - 12 oz has 100 mg of potassium  Beets (canned) -  cup has 125 mg of potassium  Blackberries -  cup has 115 mg of potassium  Blueberries -  cup has 60 mg of potassium  Bread (whole wheat) - 1 slice has 70 mg of potassium  Broccoli (raw) -  cup has 145 mg of potassium  Cabbage -  cup has 150 mg of  potassium  Carrots  (cooked or raw) -  cup has 180 mg of potassium  Cauliflower (raw) -  cup has 150 mg of potassium  Celery (raw) -  cup has 155 mg of potassium  Cereal, bran flakes -  cup has 120-150 mg of potassium  Cheese (cottage) -  cup has 110 mg of potassium  Cherries - 10 have 150 mg of potassium  Chocolate - 1 oz bar has 165 mg of potassium  Coffee (brewed) - 6 oz has 90 mg of potassium  Corn -  cup or 1 ear has 195 mg of potassium  Cucumbers -  cup has 80 mg of potassium  Egg - 1 large egg has 60 mg of potassium  Eggplant -  cup has 60 mg of potassium  Endive (raw) -  cup has 80 mg of potassium  English muffin - 1 has 65 mg of potassium  Fish (ocean perch) - 3 oz has 192 mg of potassium  Frankfurter, beef or pork - 1 has 75 mg of potassium  Fruit cocktail -  cup has 115 mg of potassium  Grape juice -  cup has 170 mg of potassium  Grapefruit -  fruit has 175 mg of potassium  Grapes -  cup has 155 mg of potassium  Greens: kale, turnip, collard -  cup has 110-150 mg of potassium  Ice cream or frozen yogurt (chocolate) -  cup has 175 mg of potassium  Ice cream or frozen yogurt (vanilla) -  cup has 120-150 mg of potassium  Lemons, limes - 1 each has 80 mg of potassium  Lettuce - 1 cup has 100 mg of potassium  Mixed vegetables -  cup has 150 mg of potassium  Mushrooms, raw -  cup has 110 mg of potassium  Nuts (walnuts, pecans, or macadamia) - 1 oz has 125 mg of potassium  Oatmeal -  cup has 80 mg of potassium  Okra -  cup has 110 mg of potassium  Onions -  cup has 120 mg of potassium  Peach - 1 has 185 mg of potassium  Peaches (canned) -  cup has 120 mg of potassium  Pears (canned) -  cup has 120 mg of potassium  Peas, green (frozen) -  cup has 90 mg of potassium  Peppers (Green) -  cup has 130 mg of potassium  Peppers (Red) -  cup has 160 mg of potassium  Pineapple juice -  cup has 165 mg of potassium  Pineapple (fresh or  canned) -  cup has 100 mg of potassium  Plums - 1 has 105 mg of potassium  Pudding, vanilla -  cup has 150 mg of potassium  Raspberries -  cup has 90 mg of potassium  Rhubarb -  cup has 115 mg of potassium  Rice, wild -  cup has 80 mg of potassium  Shrimp - 3 oz has 155 mg of potassium  Spinach (raw) - 1 cup has 170 mg of potassium  Strawberries -  cup has 125 mg of potassium  Summer squash -  cup has 175-200 mg of potassium  Swiss chard (raw) - 1 cup has 135 mg of potassium  Tangerines - 1 fruit has 140 mg of potassium  Tea, brewed - 6 oz has 65 mg of potassium  Turnips -  cup has 140 mg of potassium  Watermelon -  cup has 85 mg of potassium  Wine (Red, table) - 5 oz has 180 mg of potassium  Wine (White, table) - 5 oz 100 mg of potassium Low in potassium The following foods and beverages have less than 50 mg of potassium per serving.  Bread (white) - 1 slice has 30 mg of potassium  Carbonated beverages - 12 oz has less than 5 mg of potassium  Cheese - 1 oz has 20-30 mg of potassium  Cranberries -  cup has 45 mg of potassium  Cranberry juice cocktail -  cup has 20 mg of potassium  Fats and oils - 1 Tbsp has less than 5 mg of potassium  Hummus - 1 Tbsp has 32 mg of potassium  Nectar (papaya, mango, or pear) -  cup has 35 mg of potassium  Rice (white or brown) -  cup has 50 mg of potassium  Spaghetti or macaroni (cooked) -  cup has 30 mg of potassium  Tortilla, flour or corn - 1 has 50 mg of potassium  Waffle - 1 four-inch waffle has 50 mg of potassium  Water chestnuts -  cup has 40 mg of potassium Summary  Potassium is a mineral found in many foods and drinks. It affects how the heart works, and helps keep fluids and minerals balanced in the body.  The amount of potassium you need each day depends on your age and any existing medical conditions you may have. Your health care provider or dietitian may recommend an amount of potassium that  you should have each day. This information is not intended to replace advice given to you by your health care provider. Make sure you discuss any questions you have with your health care provider. Document Revised: 07/04/2017 Document Reviewed: 10/16/2016 Elsevier Patient Education  [place].

## 2019-08-12 NOTE — Progress Notes (Signed)
Okay to treat with ttl Bili 1.8 per Wilber Bihari NP

## 2019-08-13 ENCOUNTER — Telehealth: Payer: Self-pay | Admitting: Adult Health

## 2019-08-13 NOTE — Progress Notes (Signed)
These results were released to the patient via MyChart

## 2019-08-13 NOTE — Telephone Encounter (Signed)
I left a message regarding schedule  

## 2019-08-18 ENCOUNTER — Other Ambulatory Visit: Payer: Self-pay

## 2019-08-18 ENCOUNTER — Ambulatory Visit (HOSPITAL_COMMUNITY)
Admission: RE | Admit: 2019-08-18 | Discharge: 2019-08-18 | Disposition: A | Discharge status: Home / Self Care | Payer: Medicare Other | Source: Ambulatory Visit | Attending: Adult Health | Admitting: Adult Health

## 2019-08-18 ENCOUNTER — Encounter (HOSPITAL_COMMUNITY): Payer: Self-pay

## 2019-08-18 DIAGNOSIS — C787 Secondary malignant neoplasm of liver and intrahepatic bile duct: Secondary | ICD-10-CM | POA: Insufficient documentation

## 2019-08-18 DIAGNOSIS — C50919 Malignant neoplasm of unspecified site of unspecified female breast: Secondary | ICD-10-CM | POA: Diagnosis present

## 2019-08-18 MED ORDER — IOHEXOL 300 MG/ML  SOLN
100.0000 mL | Freq: Once | INTRAMUSCULAR | Status: AC | PRN
  Administered 2019-08-18: 100 mL via INTRAVENOUS

## 2019-08-18 MED ORDER — SODIUM CHLORIDE (PF) 0.9 % IJ SOLN
INTRAMUSCULAR | Status: AC
  Filled 2019-08-18: qty 50

## 2019-08-18 MED ORDER — HEPARIN SOD (PORK) LOCK FLUSH 100 UNIT/ML IV SOLN
INTRAVENOUS | Status: AC
  Filled 2019-08-18: qty 5

## 2019-08-18 MED ORDER — HEPARIN SOD (PORK) LOCK FLUSH 100 UNIT/ML IV SOLN
500.0000 [IU] | Freq: Once | INTRAVENOUS | Status: AC
  Administered 2019-08-18: 500 [IU] via INTRAVENOUS

## 2019-08-18 NOTE — Progress Notes (Signed)
Patient Care Team: [name], MD as PCP - General (Internal Medicine)  DIAGNOSIS:    ICD-10-CM   1. Peritoneal carcinomatosis (HCC)  C78.6    C80.1   2. Metastatic breast cancer ([place])  C50.919 Ambulatory referral to Hospice    SUMMARY OF ONCOLOGIC HISTORY: Oncology History  Metastatic breast cancer ([place])  06/05/2006 Initial Diagnosis   Left breast biopsy: DCIS with apocrine features ER 30%, PR 13%   06/19/2006 Surgery   Left lumpectomy: stage I, pT1a, pN0(i)(sn), pMX, 0.3 cm IDC, grade 1, with extensive high-grade DCIS , margins negative ER 38 %, PR 93 %, Ki-67 10%, HER-2/neu 2+, no additional tissue available for FISH testing, with 0/1 left axillary lymph nodes.   07/30/2006 - 09/17/2006 Radiation Therapy   Adjuvant radiation therapy   09/24/2006 - 09/25/2011 Anti-estrogen oral therapy   Antiestrogen therapy with Arimidex 5 years   04/26/2016 Relapse/Recurrence   Innumerable hepatic lesions seen in both lobes most are 1-2 cm size somewhat confluent largest in the posterior right hepatic dome 3 x 4.6 cm   05/07/2016 PET scan   PET/CT scan: Extensive metastatic involvement of both lobes of the liver, primary could be liver, phalangeal or metastatic disease, hypermetabolic thoracic LN left axillary, left supraclavicular and right CP angle LN, hypermetabolic upper abdominal LN   05/16/2016 Initial Biopsy   Liver biopsy: Metastatic carcinoma breast primary strong positivity for CK 7 weak focal positivity for ER, GCDFP, negative for PR, CDX 2, TTF-1, WT 1; HER-2 positive   06/13/2016 - 11/07/2016 Chemotherapy   Taxotere Herceptin and Perjeta every 3 weeks (Taxotere was held for elevated LFTs for cycles 1 and 2)   11/28/2016 - 05/06/2018 Chemotherapy   Herceptin maintenance therapy for metastatic breast cancer every 3 weeks   03/24/2018 Surgery   Chronic cholecystitis: Diagnostic laparoscopy: Due to intermittent attacks of biliary colic: The gallbladder was rockhard and not distensible  with tumor palpated in the porta hepatis, extensive lucent liver lesions, decision was made to stop the surgery.   05/12/2018 - 05/22/2018 Radiation Therapy   Radiation to the brain   05/29/2018 -  Chemotherapy   Kadcyla   08/18/2019 Relapse/Recurrence   Large volume abdomiopelvic ascites and peritoneal carcinomatosis/ omental caking beneath abd wall (new), liver mets mildly improved.     CHIEF COMPLIANT: Follow-up of metastatic breast cancer on Kadcyla to review scans  INTERVAL HISTORY: Monique Figueroa is a 86 y.o. with above-mentioned history of metastatic breast cancer with brain metastasis currently on Kadcyla. She was seen in the symptom management clinic on 08/04/19 1 week after a fall. On 08/07/19 she was advised to report to the ED after labs showed a potassium of 2.3, but opted to manage Lasix and potassium supplements at home. Echo on 08/12/19 showed an ejection fraction of 60-65%. CT CAP on 08/17/18 showed mild improvement in right hepatic lobe metastases and opacities in the lateral middle right lung. She presents to the clinic today to review her scans.  She is in a wheelchair today brought in by her son.  She is moderately distended but does not have any abdominal pain.  Her legs are severely swollen.  ALLERGIES:  is allergic to perjeta [pertuzumab] and sulfa antibiotics.  MEDICATIONS:  Current Outpatient Medications  Medication Sig Dispense Refill  . acetaminophen (TYLENOL) 500 MG tablet Take 500 mg by mouth 2 (two) times daily as needed for moderate pain or headache.    . benzocaine (ORAJEL) 10 % mucosal gel Use as directed 1 application  in the mouth or throat as needed for mouth pain.    . calcium carbonate (TUMS - DOSED IN MG ELEMENTAL CALCIUM) 500 MG chewable tablet Chew 1 tablet by mouth 2 (two) times daily as needed for indigestion or heartburn.     . ciprofloxacin (CIPRO) 500 MG tablet     . diphenhydrAMINE (BENADRYL) 25 MG tablet Take 25 mg by mouth daily as needed for  allergies.    . furosemide (LASIX) 20 MG tablet Take 2 tablets (40 mg total) by mouth daily. 60 tablet 0  . hydrochlorothiazide (HYDRODIURIL) 25 MG tablet Take 25 mg by mouth daily.    Marland Kitchen levothyroxine (SYNTHROID) 125 MCG tablet Take 1 tablet (125 mcg total) by mouth daily before breakfast. 30 tablet 0  . lidocaine-prilocaine (EMLA) cream Apply 1 application topically as needed. 30 g 0  . Liniments (BLUE-EMU SUPER STRENGTH EX) Apply 1 application topically daily as needed (muscle pain).    . metFORMIN (GLUCOPHAGE) 500 MG tablet Take 500 mg by mouth daily with breakfast.     . omeprazole (PRILOSEC OTC) 20 MG tablet Take 20 mg by mouth daily as needed (acid reflux).     . ondansetron (ZOFRAN-ODT) 8 MG disintegrating tablet TAKE 1 TABLET (8 MG TOTAL) BY MOUTH EVERY 8 (EIGHT) HOURS AS NEEDED FOR NAUSEA OR VOMITING. 20 tablet 1  . ONDANSETRON HCL PO     . oxyCODONE-acetaminophen (PERCOCET/ROXICET) 5-325 MG tablet Take 1 tablet by mouth daily as needed for severe pain.    Marland Kitchen Phenazopyridine HCl (AZO-STANDARD PO) Take 1 tablet by mouth 2 (two) times daily as needed (uti symptoms).    . potassium chloride (MICRO-K) 10 MEQ CR capsule Take 4 capsules (40 mEq total) by mouth 2 (two) times daily. 240 capsule 0  . prochlorperazine (COMPAZINE) 10 MG tablet Take 1 tablet (10 mg total) by mouth every 6 (six) hours as needed for nausea or vomiting. 30 tablet 6  . Propylene Glycol (SYSTANE COMPLETE) 0.6 % SOLN Place 1 drop into both eyes 2 (two) times daily.    . sodium chloride (OCEAN) 0.65 % SOLN nasal spray Place 1 spray into both nostrils as needed for congestion.     No current facility-administered medications for this visit.    PHYSICAL EXAMINATION: ECOG PERFORMANCE STATUS: 1 - Symptomatic but completely ambulatory  Vitals:   08/19/19 0859  BP: 128/68  Pulse: 88  Resp: 17  Temp: 98.2 F (36.8 C)  SpO2: 96%   Filed Weights   08/19/19 0859  Weight: 171 lb 3.2 oz (77.7 kg)    LABORATORY DATA:    I have reviewed the data as listed CMP Latest Ref Rng & Units 08/19/2019 08/12/2019 08/09/2019  Glucose 70 - 99 mg/dL 98 105(H) 147(H)  BUN 8 - 23 mg/dL '8 12 12  '$ Creatinine 0.44 - 1.00 mg/dL 0.65 0.70 0.72  Sodium 135 - 145 mmol/L 136 135 135  Potassium 3.5 - 5.1 mmol/L 2.5(LL) 3.1(L) 2.9(LL)  Chloride 98 - 111 mmol/L 94(L) 99 96(L)  CO2 22 - 32 mmol/L '29 26 28  '$ Calcium 8.9 - 10.3 mg/dL 6.9(L) 7.3(L) 7.4(L)  Total Protein 6.5 - 8.1 g/dL 6.4(L) 6.9 6.2(L)  Total Bilirubin 0.3 - 1.2 mg/dL 0.9 1.8(H) 1.8(H)  Alkaline Phos 38 - 126 U/L 98 93 74  AST 15 - 41 U/L 54(H) 34 36  ALT 0 - 44 U/L '10 10 14    '$ Lab Results  Component Value Date   WBC 5.1 08/19/2019   HGB 11.2 (L)  08/19/2019   HCT 32.4 (L) 08/19/2019   MCV 86.2 08/19/2019   PLT 85 (L) 08/19/2019   NEUTROABS 3.4 08/19/2019    ASSESSMENT & PLAN:  Metastatic breast cancer ([place]) Metastatic breast cancer with innumerable liver metastases detected on ultrasound and recent MRI of the liver on 04/26/2016 (Prior history of left breast IDC T1 1 N0 stage IA 0.3 cm grade 1 tumor that was ER 38%, PR 93%, Ki-67 10%, HER-2 2+, no tissue for FISH testing, 0/1 lymph node negative, status post lumpectomy radiation and 5 years of Arimidex completed in February 2013)  PET/CT scan: 05/07/2016: Extensive metastatic involvement of both lobes of the liver, primary could be liver, phalangeal or metastatic disease, hypermetabolic thoracic lymph nodes left axillary, left supraclavicular and right CP angle lymph nodes, hypermetabolic upper abdominal lymph nodes  Liver biopsy10/07/2016: Metastatic carcinoma breast primary strong positivity for CK 7 weak focal positivity for ER, GCDFP, negative for PR, CDX 2, TTF-1, WT 1; HER-2 positive  Treatment plan: 1. Taxotere Herceptin and Perjeta every 3 weeks palliative chemotherapy started 06/13/16 (Perjeta discontinued after 5 cycles due to allergy) 2. followed by Herceptin maintenance stopped 05/08/2018 due to  progression 3. Kadcyla started 05/29/2018-08/19/2019 (stopped due to peritoneal carcinomatosis) --------------------------------------------------------------------------------------------------------------------------------------- 08/18/2019: Large volume abdomiopelvic ascites and peritoneal carcinomatosis/ omental caking beneath abd wall (new), liver mets mildly improved.  Radiology review: Discussed the results. Recommendation: [facility] vs Hospice Patient was very clear that she does not want to continue further treatment.  We will consult hospice for her. DNR CC   Orders Placed This Encounter  Procedures  . Ambulatory referral to Hospice    Referral Priority:   Routine    Referral Type:   Consultation    Referral Reason:   Specialty Services Required    Requested Specialty:   Hospice Services    Number of Visits Requested:   1   The patient has a good understanding of the overall plan. she agrees with it. she will call with any problems that may develop before the next visit here.  Total time spent: 30 mins including face to face time and time spent for planning, charting and coordination of care  Nicholas Lose, MD 08/19/2019  I, Cloyde Reams Dorshimer, am acting as scribe for Dr. Nicholas Lose.  I have reviewed the above documentation for accuracy and completeness, and I agree with the above.

## 2019-08-19 ENCOUNTER — Inpatient Hospital Stay: Payer: Medicare Other

## 2019-08-19 ENCOUNTER — Other Ambulatory Visit: Payer: Self-pay

## 2019-08-19 ENCOUNTER — Encounter: Payer: Self-pay | Admitting: *Deleted

## 2019-08-19 ENCOUNTER — Inpatient Hospital Stay: Payer: Medicare Other | Admitting: Hematology and Oncology

## 2019-08-19 DIAGNOSIS — C50919 Malignant neoplasm of unspecified site of unspecified female breast: Secondary | ICD-10-CM

## 2019-08-19 DIAGNOSIS — C801 Malignant (primary) neoplasm, unspecified: Secondary | ICD-10-CM

## 2019-08-19 DIAGNOSIS — C786 Secondary malignant neoplasm of retroperitoneum and peritoneum: Secondary | ICD-10-CM

## 2019-08-19 DIAGNOSIS — Z95828 Presence of other vascular implants and grafts: Secondary | ICD-10-CM

## 2019-08-19 LAB — CMP (CANCER CENTER ONLY)
ALT: 10 U/L (ref 0–44)
AST: 54 U/L — ABNORMAL HIGH (ref 15–41)
Albumin: 2.2 g/dL — ABNORMAL LOW (ref 3.5–5.0)
Alkaline Phosphatase: 98 U/L (ref 38–126)
Anion gap: 13 (ref 5–15)
BUN: 8 mg/dL (ref 8–23)
CO2: 29 mmol/L (ref 22–32)
Calcium: 6.9 mg/dL — ABNORMAL LOW (ref 8.9–10.3)
Chloride: 94 mmol/L — ABNORMAL LOW (ref 98–111)
Creatinine: 0.65 mg/dL (ref 0.44–1.00)
GFR, Est AFR Am: >60 mL/min (ref >60)
GFR, Estimated: >60 mL/min (ref >60)
Glucose, Bld: 98 mg/dL (ref 70–99)
Potassium: 2.5 mmol/L — CL (ref 3.5–5.1)
Sodium: 136 mmol/L (ref 135–145)
Total Bilirubin: 0.9 mg/dL (ref 0.3–1.2)
Total Protein: 6.4 g/dL — ABNORMAL LOW (ref 6.5–8.1)

## 2019-08-19 LAB — CBC WITH DIFFERENTIAL (CANCER CENTER ONLY)
Abs Immature Granulocytes: 0.02 10*3/uL (ref 0.00–0.07)
Basophils Absolute: 0 10*3/uL (ref 0.0–0.1)
Basophils Relative: 1 %
Eosinophils Absolute: 0.1 10*3/uL (ref 0.0–0.5)
Eosinophils Relative: 2 %
HCT: 32.4 % — ABNORMAL LOW (ref 36.0–46.0)
Hemoglobin: 11.2 g/dL — ABNORMAL LOW (ref 12.0–15.0)
Immature Granulocytes: 0 %
Lymphocytes Relative: 7 %
Lymphs Abs: 0.4 10*3/uL — ABNORMAL LOW (ref 0.7–4.0)
MCH: 29.8 pg (ref 26.0–34.0)
MCHC: 34.6 g/dL (ref 30.0–36.0)
MCV: 86.2 fL (ref 80.0–100.0)
Monocytes Absolute: 1.2 10*3/uL — ABNORMAL HIGH (ref 0.1–1.0)
Monocytes Relative: 24 %
Neutro Abs: 3.4 10*3/uL (ref 1.7–7.7)
Neutrophils Relative %: 66 %
Platelet Count: 85 10*3/uL — ABNORMAL LOW (ref 150–400)
RBC: 3.76 MIL/uL — ABNORMAL LOW (ref 3.87–5.11)
RDW: 17.8 % — ABNORMAL HIGH (ref 11.5–15.5)
WBC Count: 5.1 10*3/uL (ref 4.0–10.5)
nRBC: 0 % (ref 0.0–0.2)

## 2019-08-19 MED ORDER — SODIUM CHLORIDE 0.9% FLUSH
10.0000 mL | INTRAVENOUS | Status: DC | PRN
  Administered 2019-08-19: 10 mL via INTRAVENOUS
  Filled 2019-08-19: qty 10

## 2019-08-19 MED ORDER — HEPARIN SOD (PORK) LOCK FLUSH 100 UNIT/ML IV SOLN
500.0000 [IU] | Freq: Once | INTRAVENOUS | Status: AC | PRN
  Administered 2019-08-19: 500 [IU] via INTRAVENOUS
  Filled 2019-08-19: qty 5

## 2019-08-19 NOTE — Progress Notes (Signed)
CRITICAL VALUE ALERT  Critical Value:  K 2.5  Date & Time Notied:  08/19/2019 at [place]  Provider Notified: Nicholas Lose, MD  Orders Received/Actions taken: MD notified, no new orders received at this time.

## 2019-08-19 NOTE — Progress Notes (Signed)
Per MD, referral placed to [facility].  RN placed call to [facility] intake and provided pt information.

## 2019-08-19 NOTE — Assessment & Plan Note (Signed)
Metastatic breast cancer with innumerable liver metastases detected on ultrasound and recent MRI of the liver on 04/26/2016 (Prior history of left breast IDC T1 1 N0 stage IA 0.3 cm grade 1 tumor that was ER 38%, PR 93%, Ki-67 10%, HER-2 2+, no tissue for FISH testing, 0/1 lymph node negative, status post lumpectomy radiation and 5 years of Arimidex completed in February 2013)  PET/CT scan: 05/07/2016: Extensive metastatic involvement of both lobes of the liver, primary could be liver, phalangeal or metastatic disease, hypermetabolic thoracic lymph nodes left axillary, left supraclavicular and right CP angle lymph nodes, hypermetabolic upper abdominal lymph nodes  Liver biopsy10/07/2016: Metastatic carcinoma breast primary strong positivity for CK 7 weak focal positivity for ER, GCDFP, negative for PR, CDX 2, TTF-1, WT 1; HER-2 positive  Treatment plan: 1. Taxotere Herceptin and Perjeta every 3 weeks palliative chemotherapy started 06/13/16 (Perjeta discontinued after 5 cycles due to allergy) 2. followed by Herceptin maintenance stopped 05/08/2018 due to progression 3. Kadcyla started 05/29/2018-08/19/2019 (stopped due to peritoneal carcinomatosis) --------------------------------------------------------------------------------------------------------------------------------------- 08/18/2019: Large volume abdomiopelvic ascites and peritoneal carcinomatosis/ omental caking beneath abd wall (new), liver mets mildly improved.  Radiology review: Discussed the results. Recommendation: Hospice and palliative care is my recommendation.

## 2019-08-19 NOTE — Patient Instructions (Signed)

## 2019-08-26 ENCOUNTER — Other Ambulatory Visit: Payer: Self-pay | Admitting: Medical

## 2019-09-02 ENCOUNTER — Other Ambulatory Visit: Payer: Medicare Other

## 2019-09-02 ENCOUNTER — Ambulatory Visit: Payer: Medicare Other

## 2019-09-03 ENCOUNTER — Other Ambulatory Visit: Payer: Self-pay | Admitting: Radiation Therapy

## 2019-09-04 ENCOUNTER — Other Ambulatory Visit: Payer: Self-pay | Admitting: Adult Health

## 2019-09-04 DIAGNOSIS — C787 Secondary malignant neoplasm of liver and intrahepatic bile duct: Secondary | ICD-10-CM

## 2019-09-04 DIAGNOSIS — C50919 Malignant neoplasm of unspecified site of unspecified female breast: Secondary | ICD-10-CM

## 2019-09-07 ENCOUNTER — Other Ambulatory Visit: Payer: Self-pay | Admitting: *Deleted

## 2019-09-07 MED ORDER — FUROSEMIDE 20 MG PO TABS: 40.0000 mg | ORAL_TABLET | Freq: Every day | ORAL | 0 refills | Status: DC

## 2019-09-17 ENCOUNTER — Ambulatory Visit (HOSPITAL_COMMUNITY): Admission: RE | Admit: 2019-09-17 | Payer: Medicare Other | Source: Ambulatory Visit

## 2019-09-21 NOTE — Progress Notes (Signed)
Called to reach out to patient about follow up appointment for tomorrow she was on the schedule for he stated she was deceased as of Sunday 10/17/2019.

## 2019-09-22 ENCOUNTER — Ambulatory Visit
Admission: RE | Admit: 2019-09-22 | Discharge: 2019-09-22 | Disposition: A | Discharge status: Home / Self Care | Payer: Medicare Other | Source: Ambulatory Visit | Attending: Urology | Admitting: Urology

## 2019-09-22 ENCOUNTER — Inpatient Hospital Stay: Admission: RE | Admit: 2019-09-22 | Payer: Self-pay | Source: Ambulatory Visit | Admitting: Urology

## 2019-09-22 NOTE — Addendum Note (Signed)
Encounter addended by: Freeman Caldron, PA-C on: 09/22/2019 9:05 AM  Actions taken: Clinical Note Signed

## 2019-09-22 NOTE — Addendum Note (Signed)
Encounter addended by: Freeman Caldron, PA-C on: 09/22/2019 9:00 AM  Actions taken: Clinical Note Signed

## 2019-09-23 ENCOUNTER — Other Ambulatory Visit: Payer: Medicare Other

## 2019-09-23 ENCOUNTER — Ambulatory Visit: Payer: Medicare Other | Admitting: Hematology and Oncology

## 2019-09-23 ENCOUNTER — Ambulatory Visit: Payer: Medicare Other

## 2019-09-29 ENCOUNTER — Other Ambulatory Visit: Payer: Self-pay | Admitting: Hematology and Oncology

## 2019-10-04 DEATH — deceased

## 2019-10-12 ENCOUNTER — Other Ambulatory Visit: Payer: Self-pay | Admitting: Hematology and Oncology

## 2019-12-20 IMAGING — RF ERCP
1 series · 7 of 7 positions shown · non-contrast
Comparison: None.

CLINICAL DATA: 85-year-old female undergoing ERCP

EXAM:
ERCP
TECHNIQUE: Multiple spot images obtained with the fluoroscopic device and
submitted for interpretation post-procedure.
FLUOROSCOPY TIME:  Fluoroscopy Time:  6 minutes 50 seconds reported

[Series 1: run · 7 of 7 slices shown]
[im 1/7]
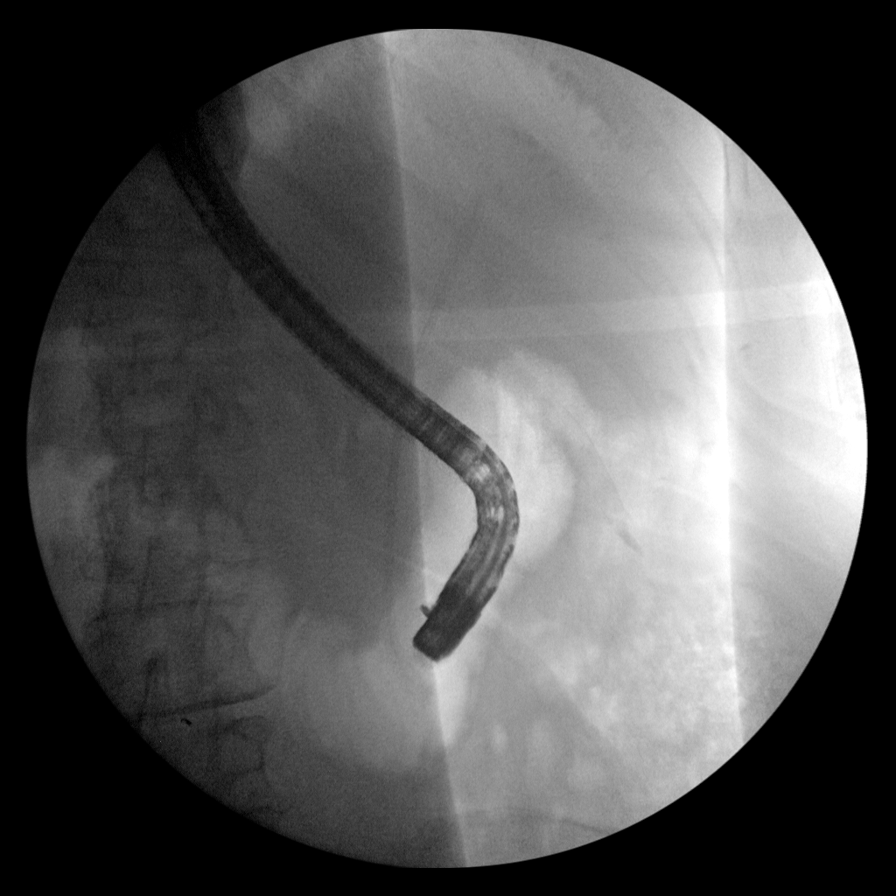
[im 2/7]
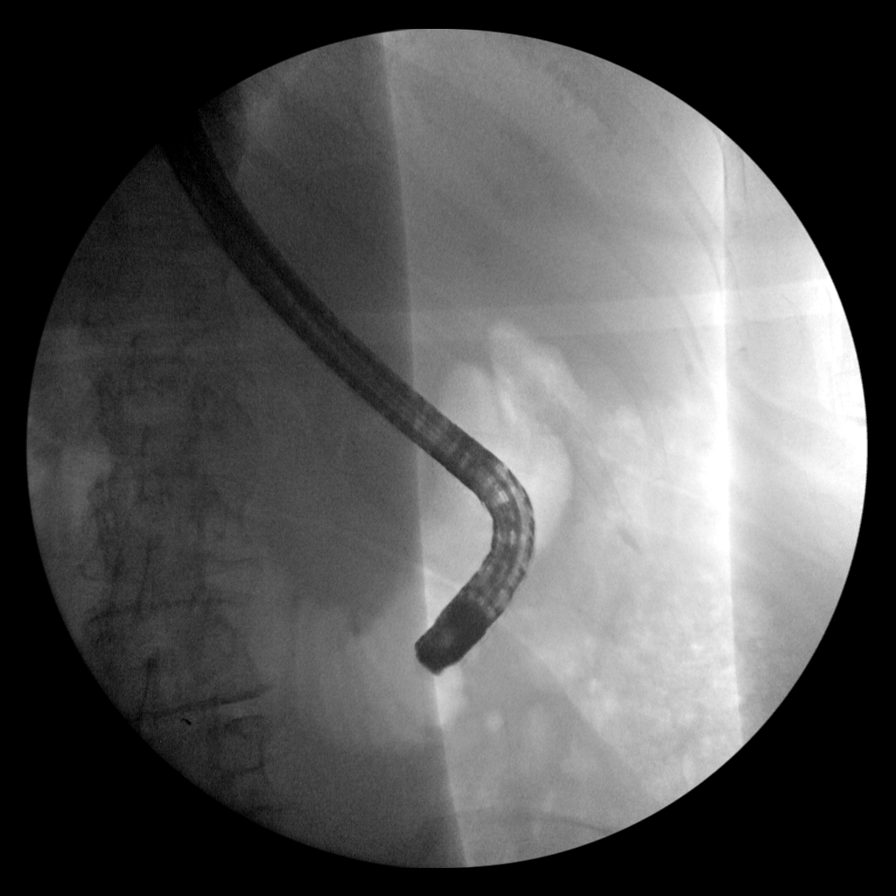
[im 3/7]
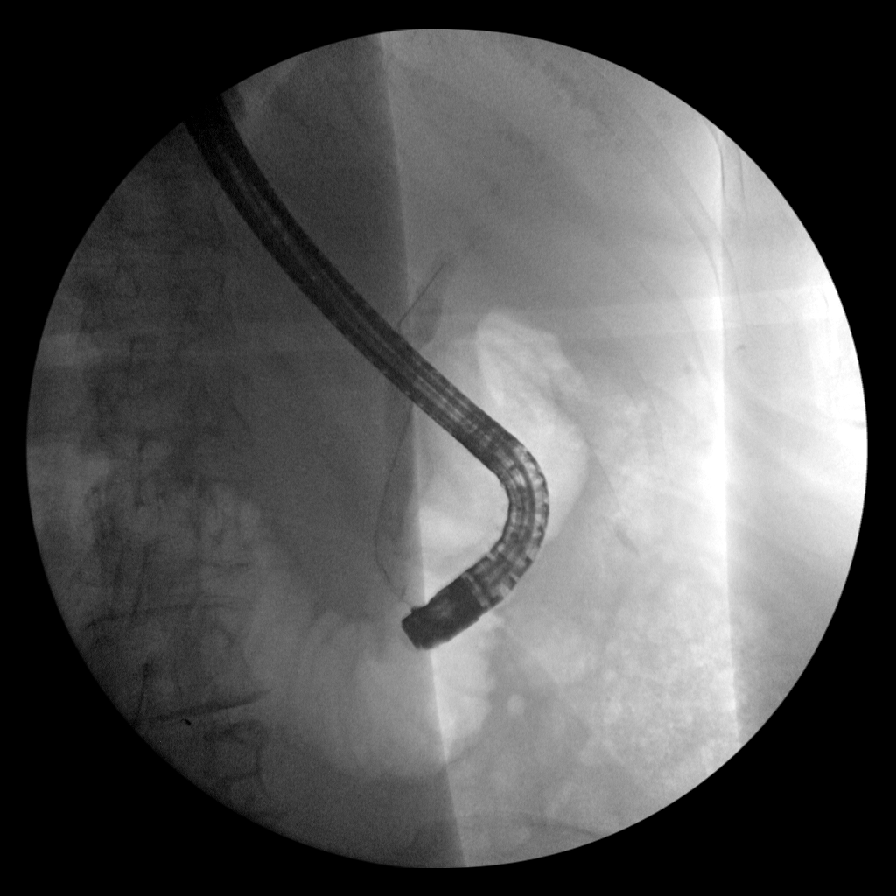
[im 4/7]
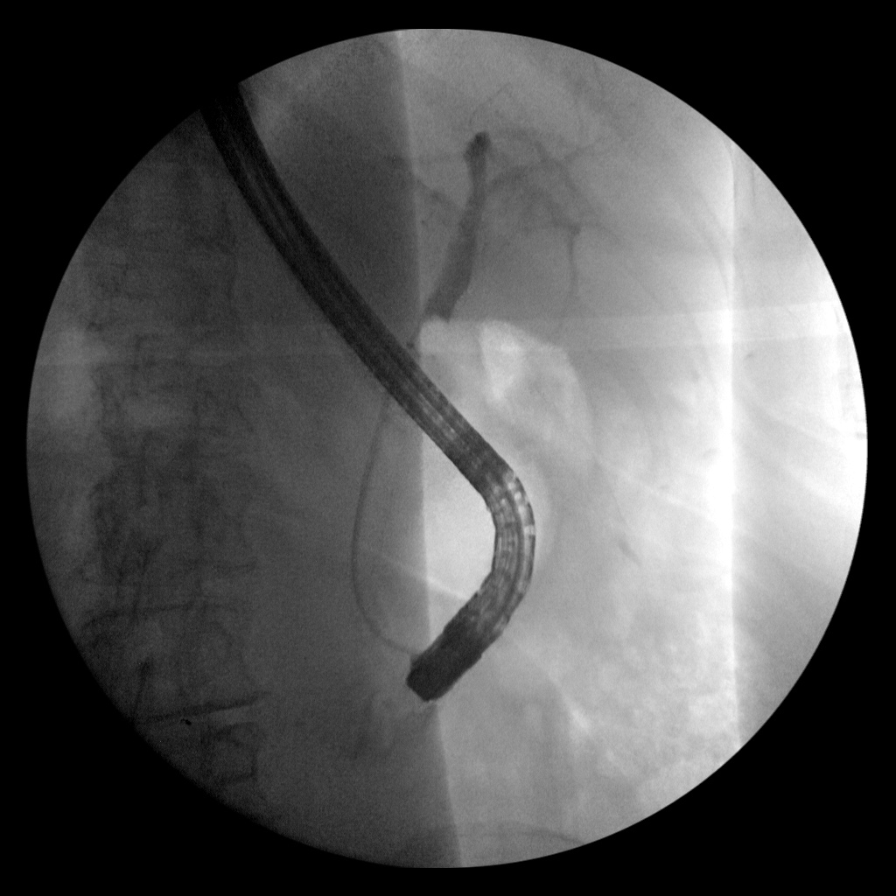
[im 5/7]
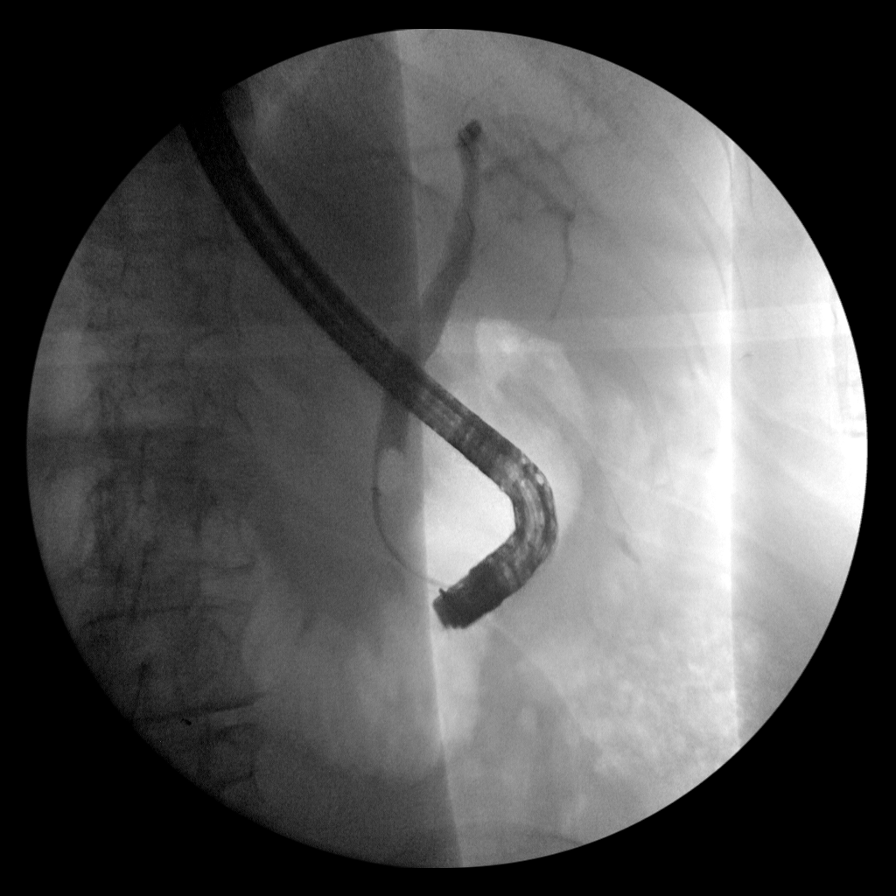
[im 6/7]
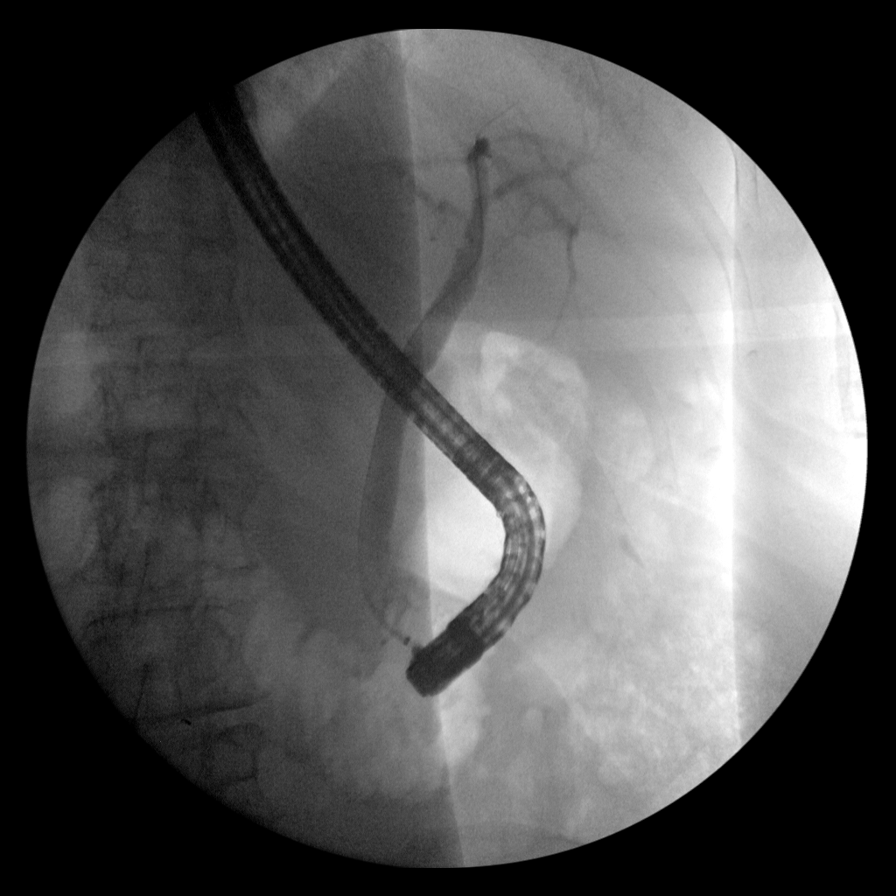
[im 7/7]
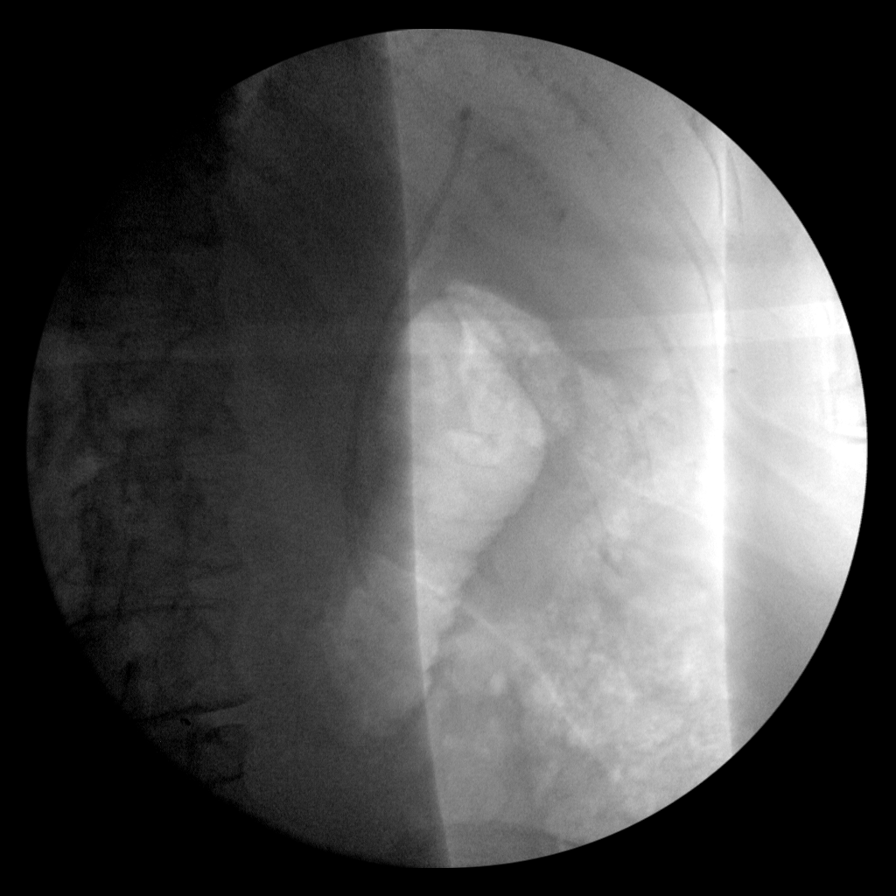

[7 of 7 positions shown; findings below may reference images not displayed]

FINDINGS: A total of 5 intraoperative spot images demonstrate a flexible
endoscope in the descending duodenum with wire cannulation of the
common hepatic duct. Subsequent images demonstrate balloon sweep of
the common duct and placement of a plastic biliary stent.
IMPRESSION: ERCP with balloon sweep of the common duct and placement of a
plastic biliary stent.

These images were submitted for radiologic interpretation only.
Please see the procedural report for the amount of contrast and the
fluoroscopy time utilized.

## 2020-04-29 IMAGING — CT CT CHEST WITH CONTRAST
2 of 5 series · 13 of 36 positions shown, 16 images · IV contrast (APPLIED)
Comparison: 09/30/2018.

CLINICAL DATA: Metastatic breast cancer.

EXAM:
CT CHEST, ABDOMEN, AND PELVIS WITH CONTRAST
TECHNIQUE: Multidetector CT imaging of the chest, abdomen and pelvis was
performed following the standard protocol during bolus
administration of intravenous contrast.
CONTRAST:  100mL OMNIPAQUE IOHEXOL 300 MG/ML  SOLN

[Series 2: cap with · axial · 0.92mm/px · z∈[+1015,+1525]mm · 10 of 126 slices shown, 13 images]
[im 12/126  mediastinal]
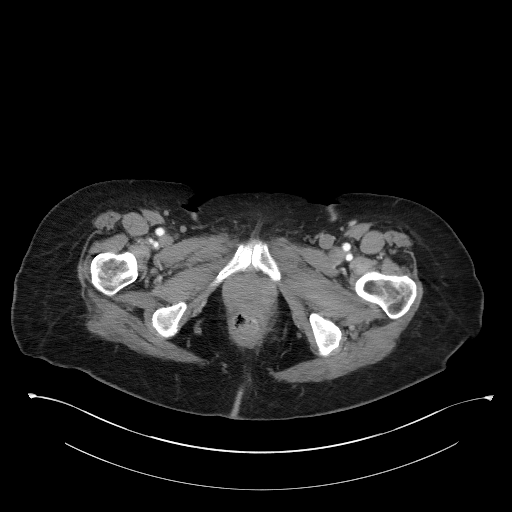
[im 12/126  lung]
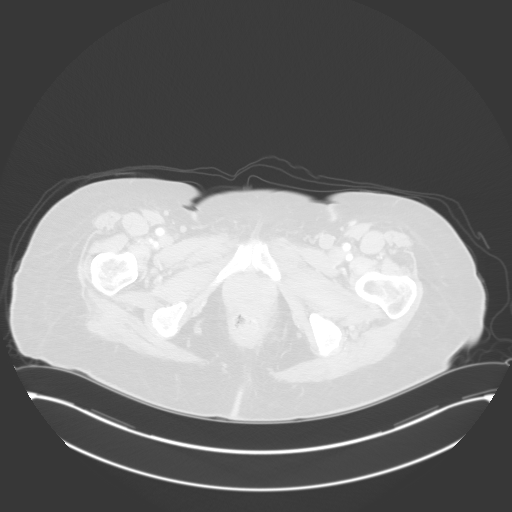
[im 23/126  lung]
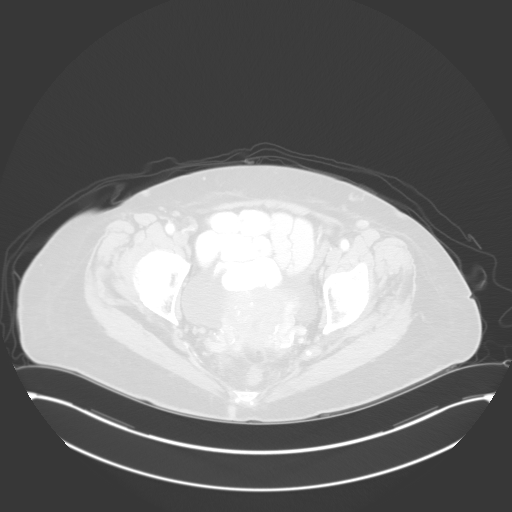
[im 35/126  lung]
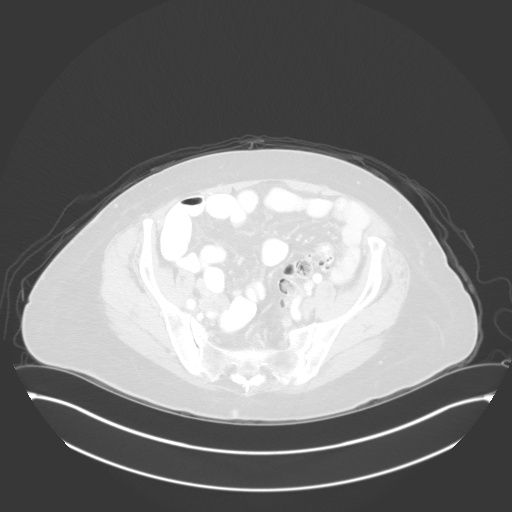
[im 46/126  lung]
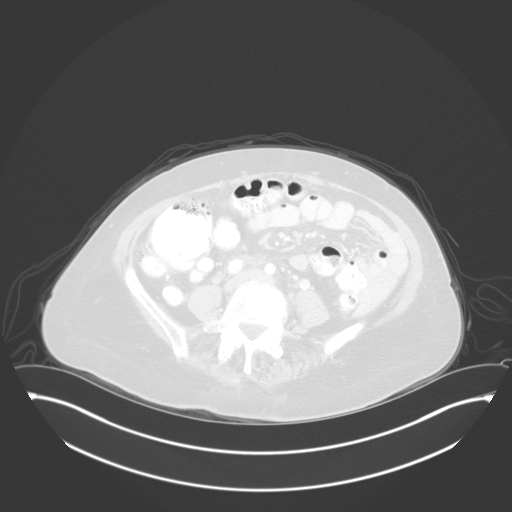
[im 57/126  mediastinal]
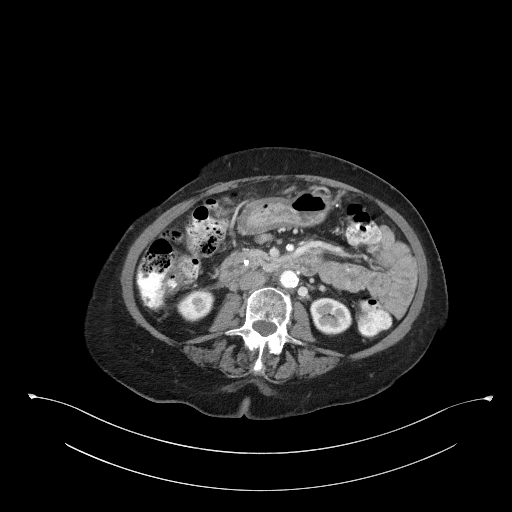
[im 57/126  lung]
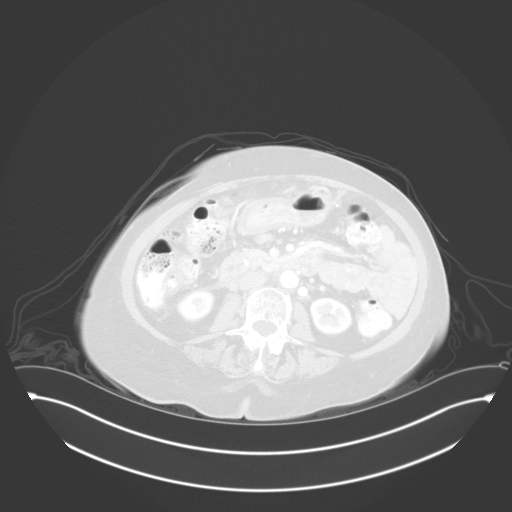
[im 69/126  lung]
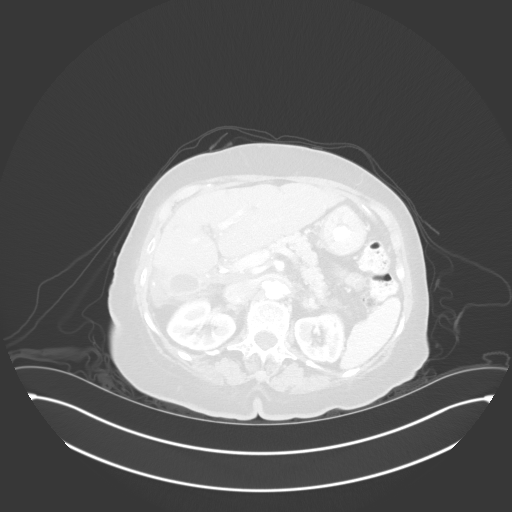
[im 80/126  lung]
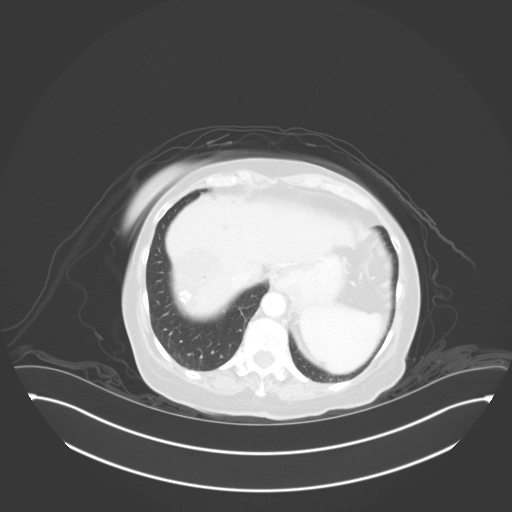
[im 91/126  lung]
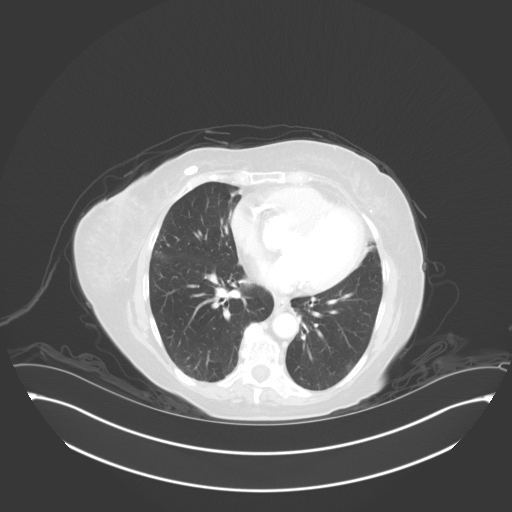
[im 103/126  mediastinal]
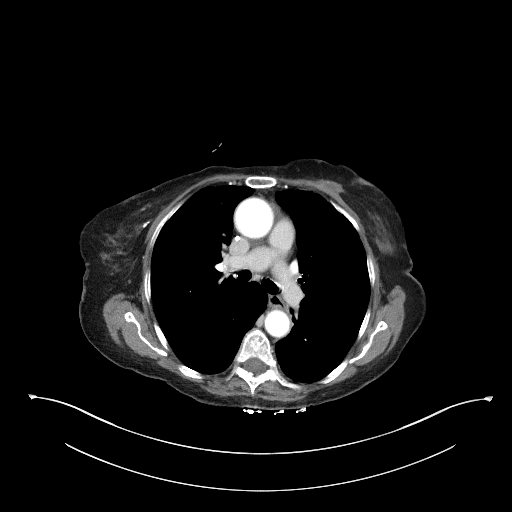
[im 103/126  lung]
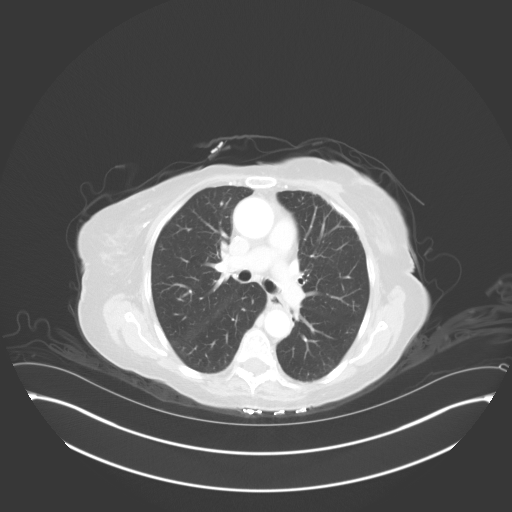
[im 114/126  lung]
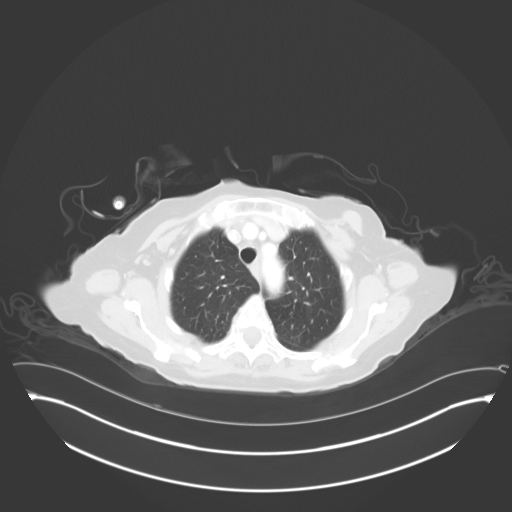

[Series 5: coronals · coronal · 0.72mm/px · 3 of 136 slices shown]
[im 28/136  lung]
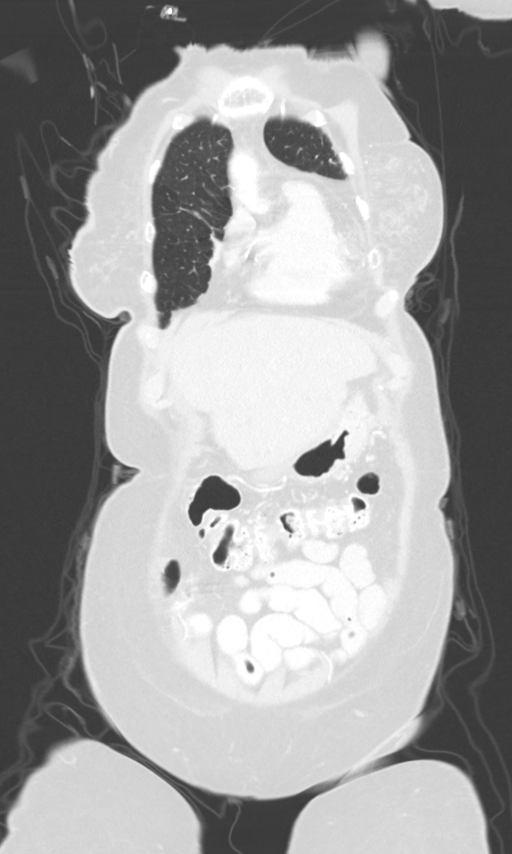
[im 55/136  lung]
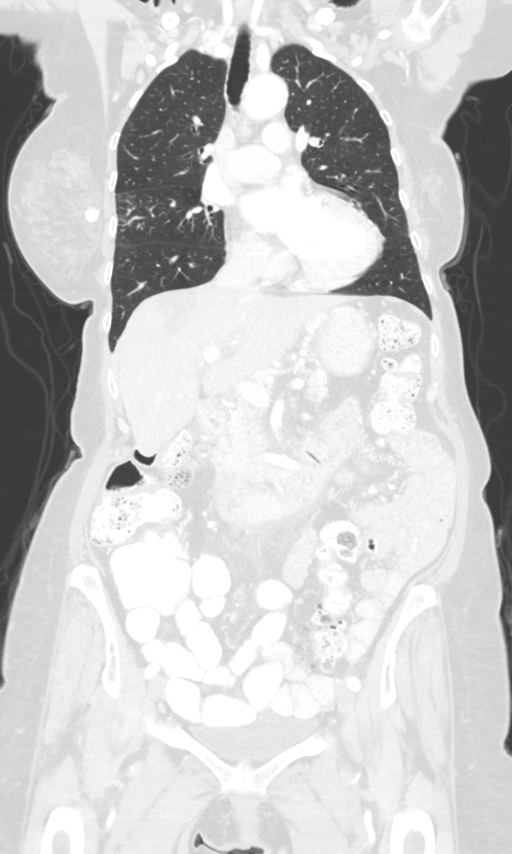
[im 82/136  lung]
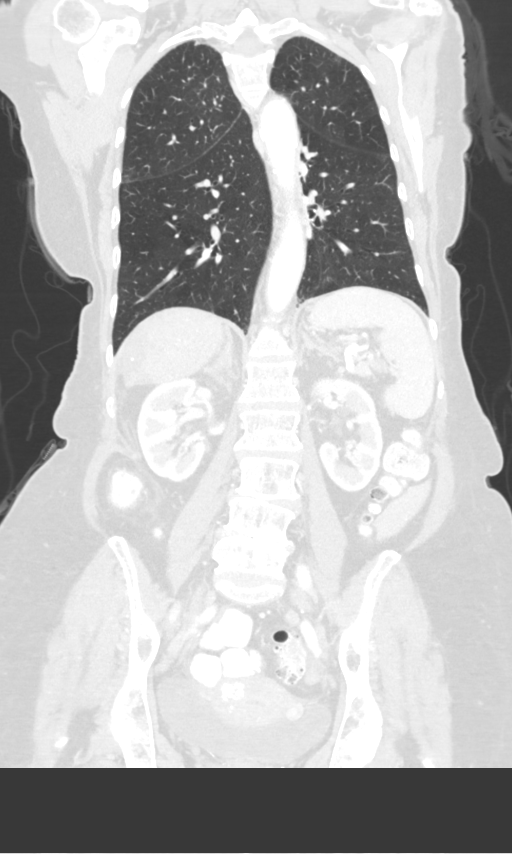

[13 of 36 positions shown; findings below may reference images not displayed]

FINDINGS: CT CHEST FINDINGS

Cardiovascular: Right IJ Port-A-Cath terminates at the SVC RA
junction. Atherosclerotic calcification of the aorta and coronary
arteries. Heart is mildly enlarged. No pericardial effusion.

Mediastinum/Nodes: No pathologically enlarged mediastinal, hilar,
axillary or internal mammary lymph nodes. Prepericardiac lymph node
measures 6 mm, stable. Esophagus is grossly unremarkable.

Lungs/Pleura: Septal thickening and peribronchovascular nodularity
in the lateral segment right middle lobe, unchanged. Mild
bronchiectasis and volume loss in the medial segment right middle
lobe, as before. Subpleural radiation scarring in the anterior left
upper lobe. No pleural fluid. Airway is unremarkable.

Musculoskeletal: Degenerative changes in the spine. No worrisome
lytic or sclerotic lesions.

CT ABDOMEN PELVIS FINDINGS

Hepatobiliary: Liver margin is irregular. Heterogeneous and
partially calcified mass in the dome of the right hepatic lobe
measures 4.5 x 5.1 cm, slightly decreased from 5.1 x 5.4 cm.
Additional scattered subcentimeter low-attenuation lesions are
similar. Cholecystectomy. A stent is seen in the common bile duct.

Pancreas: Negative.

Spleen: Negative.

Adrenals/Urinary Tract: Adrenal glands are unremarkable.
Subcentimeter low-attenuation lesions in the right kidney are too
small to characterize. Ureters are decompressed. Bladder is grossly
unremarkable.

Stomach/Bowel: Small hiatal hernia. Stomach is relatively
decompressed. Common bile duct stent terminates in the duodenum.
Small bowel and colon are otherwise unremarkable. Appendix is not
readily visualized.

Vascular/Lymphatic: Atherosclerotic calcification of the aorta
without aneurysm. Mild vague haziness in the abdominal
retroperitoneum, similar. No discrete adenopathy.

Reproductive: Calcified lesions in the uterus are likely fibroids.
No adnexal mass.

Other: No free fluid.  Mesenteries and peritoneum are unremarkable.

Musculoskeletal: Degenerative changes in the spine. No worrisome
lytic or sclerotic lesions. Mild grade 1 anterolisthesis of L5 on
S1.
IMPRESSION: 1. Slight interval decrease in size of presumably treated metastasis
in the right hepatic lobe. Additional hypodense lesions in the liver
are grossly stable.
2. Marginal irregularity of the liver can be seen with treatment
related hepatic fibrosis or cirrhosis.
3. No additional evidence of distant metastatic disease.
4. Aortic atherosclerosis ([id number]-170.0). Coronary artery
calcification.
